# Patient Record
Sex: Female | Born: 1935
Health system: Southern US, Community
[De-identification: ages and names within clinical notes are randomized; demographics above are authoritative.]

## PROBLEM LIST (undated history)

## (undated) DIAGNOSIS — M810 Age-related osteoporosis without current pathological fracture: Secondary | ICD-10-CM

## (undated) DIAGNOSIS — R2689 Other abnormalities of gait and mobility: Secondary | ICD-10-CM

## (undated) DIAGNOSIS — H9190 Unspecified hearing loss, unspecified ear: Secondary | ICD-10-CM

## (undated) DIAGNOSIS — F419 Anxiety disorder, unspecified: Secondary | ICD-10-CM

## (undated) DIAGNOSIS — J309 Allergic rhinitis, unspecified: Secondary | ICD-10-CM

## (undated) DIAGNOSIS — I10 Essential (primary) hypertension: Secondary | ICD-10-CM

## (undated) DIAGNOSIS — E785 Hyperlipidemia, unspecified: Secondary | ICD-10-CM

## (undated) DIAGNOSIS — R739 Hyperglycemia, unspecified: Secondary | ICD-10-CM

## (undated) DIAGNOSIS — S42301A Unspecified fracture of shaft of humerus, right arm, initial encounter for closed fracture: Secondary | ICD-10-CM

## (undated) DIAGNOSIS — R5383 Other fatigue: Secondary | ICD-10-CM

## (undated) DIAGNOSIS — R413 Other amnesia: Secondary | ICD-10-CM

## (undated) HISTORY — DX: Unspecified fracture of shaft of humerus, right arm, initial encounter for closed fracture: S42.301A

## (undated) HISTORY — DX: Age-related osteoporosis without current pathological fracture: M81.0

## (undated) HISTORY — PX: ABDOMINAL HYSTERECTOMY: SHX81

## (undated) HISTORY — DX: Other abnormalities of gait and mobility: R26.89

## (undated) HISTORY — DX: Anxiety disorder, unspecified: F41.9

## (undated) HISTORY — DX: Hyperglycemia, unspecified: R73.9

## (undated) HISTORY — DX: Allergic rhinitis, unspecified: J30.9

## (undated) HISTORY — DX: Hyperlipidemia, unspecified: E78.5

## (undated) HISTORY — DX: Other fatigue: R53.83

## (undated) HISTORY — DX: Essential (primary) hypertension: I10

## (undated) HISTORY — DX: Unspecified hearing loss, unspecified ear: H91.90

## (undated) HISTORY — DX: Other amnesia: R41.3

---
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
## 1986-12-13 HISTORY — PX: ELBOW SURGERY: SHX618

## 2008-12-13 HISTORY — PX: CATARACT EXTRACTION: SUR2

## 2013-12-13 HISTORY — PX: HUMERUS FRACTURE SURGERY: SHX670

## 2014-06-12 HISTORY — PX: FEMUR FRACTURE SURGERY: SHX633

## 2014-12-27 DIAGNOSIS — L84 Corns and callosities: Secondary | ICD-10-CM | POA: Diagnosis not present

## 2014-12-27 DIAGNOSIS — M79672 Pain in left foot: Secondary | ICD-10-CM | POA: Diagnosis not present

## 2014-12-27 DIAGNOSIS — M79671 Pain in right foot: Secondary | ICD-10-CM | POA: Diagnosis not present

## 2014-12-27 DIAGNOSIS — L602 Onychogryphosis: Secondary | ICD-10-CM | POA: Diagnosis not present

## 2014-12-30 ENCOUNTER — Other Ambulatory Visit: Payer: Self-pay

## 2014-12-30 MED ORDER — METOPROLOL SUCCINATE ER 100 MG PO TB24: ORAL_TABLET | ORAL | Status: DC

## 2014-12-30 NOTE — Telephone Encounter (Signed)
Spoke with husband, she is out of the Metoprolol (here from [place]) made appt with Shanda BumpsJessica for 01/06/15, will ask Dr. Renato Gailseed if we can call in just enough until her appointment. Dr. Renato Gailseed ok #[phone]. Called patient back at 5:00 no answer. Faxed Rx to [facility].

## 2015-01-06 ENCOUNTER — Encounter: Payer: Self-pay | Admitting: Nurse Practitioner

## 2015-01-06 ENCOUNTER — Ambulatory Visit (INDEPENDENT_AMBULATORY_CARE_PROVIDER_SITE_OTHER): Payer: Medicare Other | Admitting: Nurse Practitioner

## 2015-01-06 VITALS — BP 120/78 | HR 72 | Temp 98.9°F | Ht 62.5 in | Wt 124.0 lb

## 2015-01-06 DIAGNOSIS — E785 Hyperlipidemia, unspecified: Secondary | ICD-10-CM | POA: Diagnosis not present

## 2015-01-06 DIAGNOSIS — J309 Allergic rhinitis, unspecified: Secondary | ICD-10-CM | POA: Insufficient documentation

## 2015-01-06 DIAGNOSIS — F419 Anxiety disorder, unspecified: Secondary | ICD-10-CM | POA: Diagnosis not present

## 2015-01-06 DIAGNOSIS — I10 Essential (primary) hypertension: Secondary | ICD-10-CM | POA: Diagnosis not present

## 2015-01-06 DIAGNOSIS — T7840XA Allergy, unspecified, initial encounter: Secondary | ICD-10-CM | POA: Insufficient documentation

## 2015-01-06 DIAGNOSIS — H9193 Unspecified hearing loss, bilateral: Secondary | ICD-10-CM

## 2015-01-06 DIAGNOSIS — R739 Hyperglycemia, unspecified: Secondary | ICD-10-CM

## 2015-01-06 DIAGNOSIS — F418 Other specified anxiety disorders: Secondary | ICD-10-CM | POA: Insufficient documentation

## 2015-01-06 DIAGNOSIS — H6121 Impacted cerumen, right ear: Secondary | ICD-10-CM

## 2015-01-06 DIAGNOSIS — M810 Age-related osteoporosis without current pathological fracture: Secondary | ICD-10-CM

## 2015-01-06 NOTE — Progress Notes (Signed)
Patient ID: Ana [name], female   DOB: 1926-07-07, 81 y.o.   MRN: [id number]    PCP: Sharon Seller, NP  Allergies  Allergen Reactions  . Ace Inhibitors     Chief Complaint  Patient presents with  . Medical Management of Chronic Issues    New Patient, moved to [facility] 11/15/14, from [place], [place]. Needs medications refilled     HPI: Patient is a 81 y.o. female seen in the office today to establish care. [facility] and already has appt set up with Dr Chilton Si. Needs medication refills. New to the area, family lives here. From [place].  Pt with multiple falls and fx. humerus fracture, right, conservation treatment, splint only  Another fall in July and fell and fx femur s/p ORIF Increase urination, twice a night for 4-5 months, feeling that she has to go. No incontinence.   Was told she was pre-diabetic and to watch diet at last visit when she was in [place].  Review of Systems:  Review of Systems  Constitutional: Negative for activity change, appetite change, fatigue and unexpected weight change.  HENT: Positive for hearing loss. Negative for congestion.   Eyes: Negative.   Respiratory: Negative for cough and shortness of breath.   Cardiovascular: Negative for chest pain, palpitations and leg swelling.  Gastrointestinal: Negative for abdominal pain, diarrhea and constipation.  Genitourinary: Positive for frequency. Negative for dysuria and difficulty urinating.  Musculoskeletal: Negative for myalgias and arthralgias.  Skin: Negative for color change and wound.       Dry skin   Neurological: Positive for dizziness. Negative for weakness, light-headedness and headaches. Facial asymmetry: occasionally if she gets up too fast.  Hematological: Bruises/bleeds easily.  Psychiatric/Behavioral: Positive for confusion. Negative for agitation. The patient is nervous/anxious.        Anxiety and depression but this is currently uncontrolled.     Past Medical History  Diagnosis Date  .  Hypertension   . Hyperlipidemia   . Anxiety    Past Surgical History  Procedure Laterality Date  . Tonsillectomy  1935  . Abdominal hysterectomy    . Cataract extraction  2010  . Humerus fracture surgery Right 2015    [place], [place] Dr. Eber Hong  . Femur fracture surgery Right 06/2014    [place], [place] Dr. Dione Housekeeper  . Elbow surgery Right 1988   Social History:   reports that she has never smoked. She has never used smokeless tobacco. She reports that she drinks about 0.6 - 1.2 oz of alcohol per week. She reports that she does not use illicit drugs.  Family History  Problem Relation Age of Onset  . Heart disease Mother   . Diabetes Daughter     Medications: Patient's Medications  New Prescriptions   No medications on file  Previous Medications   ALENDRONATE (FOSAMAX) 35 MG TABLET    Take 35 mg by mouth. Take with a full glass of water on an empty stomach once a week .   ASPIRIN 325 MG TABLET    Take 325 mg by mouth. Take one as needed   B COMPLEX-C-FOLIC ACID PO    Take by mouth. Take one tablet daily   CALCIUM-MAGNESIUM-ZINC 333-133-5 MG TABS    Take by mouth. Take one tablet daily   CHOLECALCIFEROL (VITAMIN D3) 1000 UNITS CAPS    Take by mouth. Take one tablet daily   DIPHENHYDRAMINE (BENADRYL) 25 MG TABLET    Take 25 mg by mouth. Take one as  needed   EPINEPHRINE 0.3 MG/0.3 ML IJ SOAJ INJECTION    Inject into the muscle once. Inject as directed as needed   FERROUS SULFATE 325 (65 FE) MG TABLET    Take 325 mg by mouth daily with breakfast.   FLUOXETINE (PROZAC) 40 MG CAPSULE    Take 40 mg by mouth. Take one tablet daily for nerves   FLUTICASONE (FLONASE) 50 MCG/ACT NASAL SPRAY    Place into both nostrils. As needed   METOPROLOL SUCCINATE (TOPROL-XL) 100 MG 24 HR TABLET    Take with or immediately following a meal once a day for blood pressure   PRAVASTATIN (PRAVACHOL) 80 MG TABLET    Take 80 mg by mouth. Take one tablet daily in evening for cholesterol  Modified  Medications   No medications on file  Discontinued Medications   GABAPENTIN (NEURONTIN) 300 MG CAPSULE    Take 300 mg by mouth. Take one capsule daily at bedtime     Physical Exam:  Filed Vitals:   01/06/15 1350  BP: 120/78  Pulse: 72  Temp: 98.9 F (37.2 C)  TempSrc: Oral  Height: 5' 2.5" (1.588 m)  Weight: 124 lb (56.246 kg)  SpO2: 94%    Physical Exam  Constitutional: She is oriented to person, place, and time. She appears well-developed and well-nourished. No distress.  HENT:  Head: Normocephalic and atraumatic.  Mouth/Throat: Oropharynx is clear and moist. No oropharyngeal exudate.  Eyes: Conjunctivae are normal. Pupils are equal, round, and reactive to light.  Neck: Normal range of motion. Neck supple.  Cardiovascular: Normal rate, regular rhythm and normal heart sounds.   Pulmonary/Chest: Effort normal and breath sounds normal.  Abdominal: Soft. Bowel sounds are normal.  Musculoskeletal: She exhibits no edema or tenderness.  Neurological: She is alert and oriented to person, place, and time.  Skin: Skin is warm and dry. She is not diaphoretic.  Psychiatric: She has a normal mood and affect.    Labs reviewed: Basic Metabolic Panel: No results for input(s): NA, K, CL, CO2, GLUCOSE, BUN, CREATININE, CALCIUM, MG, PHOS, TSH in the last 8760 hours. Liver Function Tests: No results for input(s): AST, ALT, ALKPHOS, BILITOT, PROT, ALBUMIN in the last 8760 hours. No results for input(s): LIPASE, AMYLASE in the last 8760 hours. No results for input(s): AMMONIA in the last 8760 hours. CBC: No results for input(s): WBC, NEUTROABS, HGB, HCT, MCV, PLT in the last 8760 hours. Lipid Panel: No results for input(s): CHOL, HDL, LDLCALC, TRIG, CHOLHDL, LDLDIRECT in the last 8760 hours. TSH: No results for input(s): TSH in the last 8760 hours. A1C: No results found for: HGBA1C   Assessment/Plan  1. Allergic rhinitis, unspecified allergic rhinitis type -conts on flonase    2. Osteoporosis conts on fosamax   3. Anxiety Does not feel like this is properly controlled but there has been no major changes from baseline, will cont prozac at this time  4. Hyperlipidemia conts on Pravachol Fasting Lipids and CMP  ordered to be done at [facility]   5. Essential hypertension Blood pressure stable at this time. conts on metoprolol succinate and ASA -will also follow up CBC at [facility]  6. Hyperglycemia -hx of prediabetes, will follow up A1c   7. Hearing loss, bilateral - Ambulatory referral to Audiology  8. Cerumen impaction, right To use debrox into right ear 5-10 drops twice daily for 3 days then to see clinic nurse to flush ear to remove wax  To follow up with Dr Chilton Si for EV in  2 months

## 2015-01-06 NOTE — Patient Instructions (Signed)
[facility]  Keep follow up with Dr Chilton SiGreen.

## 2015-01-09 DIAGNOSIS — R739 Hyperglycemia, unspecified: Secondary | ICD-10-CM | POA: Diagnosis not present

## 2015-01-09 DIAGNOSIS — E785 Hyperlipidemia, unspecified: Secondary | ICD-10-CM | POA: Diagnosis not present

## 2015-01-09 DIAGNOSIS — I10 Essential (primary) hypertension: Secondary | ICD-10-CM | POA: Diagnosis not present

## 2015-01-09 LAB — BASIC METABOLIC PANEL
BUN: 13 mg/dL (ref 4–21)
Creatinine: 0.7 mg/dL (ref 0.5–1.1)
GLUCOSE: 101 mg/dL
Potassium: 4.3 mmol/L (ref 3.4–5.3)
Sodium: 143 mmol/L (ref 137–147)

## 2015-01-09 LAB — HEPATIC FUNCTION PANEL
ALT: 15 U/L (ref 7–35)
AST: 17 U/L (ref 13–35)
Alkaline Phosphatase: 66 U/L (ref 25–125)
Bilirubin, Total: 0.6 mg/dL

## 2015-01-09 LAB — LIPID PANEL
CHOLESTEROL: 178 mg/dL (ref 0–200)
HDL: 54 mg/dL (ref 35–70)
LDL CALC: 105 mg/dL
Triglycerides: 96 mg/dL (ref 40–160)

## 2015-01-09 LAB — CBC AND DIFFERENTIAL
HCT: 40 % (ref 36–46)
Hemoglobin: 13.4 g/dL (ref 12.0–16.0)
Platelets: 246 10*3/uL (ref 150–399)
WBC: 7.3 10*3/mL

## 2015-01-09 LAB — HEMOGLOBIN A1C: HEMOGLOBIN A1C: 5.9 % (ref 4.0–6.0)

## 2015-01-10 ENCOUNTER — Other Ambulatory Visit: Payer: Self-pay | Admitting: Internal Medicine

## 2015-01-13 ENCOUNTER — Encounter: Payer: Self-pay | Admitting: *Deleted

## 2015-01-15 DIAGNOSIS — H903 Sensorineural hearing loss, bilateral: Secondary | ICD-10-CM | POA: Diagnosis not present

## 2015-01-15 DIAGNOSIS — H9311 Tinnitus, right ear: Secondary | ICD-10-CM | POA: Diagnosis not present

## 2015-01-21 ENCOUNTER — Encounter: Payer: Self-pay | Admitting: Internal Medicine

## 2015-03-17 ENCOUNTER — Other Ambulatory Visit: Payer: Self-pay | Admitting: *Deleted

## 2015-03-17 MED ORDER — FLUOXETINE HCL 40 MG PO CAPS
ORAL_CAPSULE | ORAL | Status: DC
Start: 2015-03-17 — End: 2015-08-19

## 2015-03-17 NOTE — Telephone Encounter (Signed)
Ana [name] 

## 2015-04-11 DIAGNOSIS — L84 Corns and callosities: Secondary | ICD-10-CM | POA: Diagnosis not present

## 2015-04-11 DIAGNOSIS — M79672 Pain in left foot: Secondary | ICD-10-CM | POA: Diagnosis not present

## 2015-04-11 DIAGNOSIS — M79671 Pain in right foot: Secondary | ICD-10-CM | POA: Diagnosis not present

## 2015-04-11 DIAGNOSIS — L602 Onychogryphosis: Secondary | ICD-10-CM | POA: Diagnosis not present

## 2015-04-17 ENCOUNTER — Encounter: Payer: Self-pay | Admitting: *Deleted

## 2015-04-17 DIAGNOSIS — I10 Essential (primary) hypertension: Secondary | ICD-10-CM | POA: Diagnosis not present

## 2015-04-17 DIAGNOSIS — R739 Hyperglycemia, unspecified: Secondary | ICD-10-CM | POA: Diagnosis not present

## 2015-04-17 DIAGNOSIS — E785 Hyperlipidemia, unspecified: Secondary | ICD-10-CM | POA: Diagnosis not present

## 2015-04-17 LAB — LIPID PANEL
Cholesterol: 199 mg/dL (ref 0–200)
HDL: 66 mg/dL (ref 35–70)
LDL Cholesterol: 113 mg/dL
TRIGLYCERIDES: 100 mg/dL (ref 40–160)

## 2015-04-17 LAB — HEPATIC FUNCTION PANEL
ALT: 14 U/L (ref 7–35)
AST: 16 U/L (ref 13–35)
Alkaline Phosphatase: 77 U/L (ref 25–125)
BILIRUBIN, TOTAL: 0.6 mg/dL

## 2015-04-17 LAB — BASIC METABOLIC PANEL
BUN: 15 mg/dL (ref 4–21)
Creatinine: 0.7 mg/dL (ref 0.5–1.1)
Glucose: 103 mg/dL
POTASSIUM: 4.4 mmol/L (ref 3.4–5.3)
Sodium: 143 mmol/L (ref 137–147)

## 2015-04-17 LAB — CBC AND DIFFERENTIAL
HEMATOCRIT: 40 % (ref 36–46)
HEMOGLOBIN: 13.7 g/dL (ref 12.0–16.0)
Platelets: 229 10*3/uL (ref 150–399)
WBC: 7.9 10*3/mL

## 2015-04-17 LAB — HEMOGLOBIN A1C: Hgb A1c MFr Bld: 5.8 % (ref 4.0–6.0)

## 2015-04-22 ENCOUNTER — Encounter: Payer: Self-pay | Admitting: Internal Medicine

## 2015-04-22 ENCOUNTER — Non-Acute Institutional Stay: Payer: Medicare Other | Admitting: Internal Medicine

## 2015-04-22 VITALS — BP 124/80 | HR 60 | Temp 97.8°F | Ht 62.5 in | Wt 132.0 lb

## 2015-04-22 DIAGNOSIS — R413 Other amnesia: Secondary | ICD-10-CM | POA: Diagnosis not present

## 2015-04-22 DIAGNOSIS — I10 Essential (primary) hypertension: Secondary | ICD-10-CM | POA: Diagnosis not present

## 2015-04-22 DIAGNOSIS — F419 Anxiety disorder, unspecified: Secondary | ICD-10-CM

## 2015-04-22 DIAGNOSIS — R739 Hyperglycemia, unspecified: Secondary | ICD-10-CM | POA: Diagnosis not present

## 2015-04-22 DIAGNOSIS — E785 Hyperlipidemia, unspecified: Secondary | ICD-10-CM

## 2015-04-22 DIAGNOSIS — R5382 Chronic fatigue, unspecified: Secondary | ICD-10-CM | POA: Diagnosis not present

## 2015-04-22 NOTE — Progress Notes (Signed)
Failed clock drawing  

## 2015-04-23 DIAGNOSIS — M6281 Muscle weakness (generalized): Secondary | ICD-10-CM | POA: Diagnosis not present

## 2015-04-23 DIAGNOSIS — R2681 Unsteadiness on feet: Secondary | ICD-10-CM | POA: Diagnosis not present

## 2015-04-25 DIAGNOSIS — M6281 Muscle weakness (generalized): Secondary | ICD-10-CM | POA: Diagnosis not present

## 2015-04-25 DIAGNOSIS — R2681 Unsteadiness on feet: Secondary | ICD-10-CM | POA: Diagnosis not present

## 2015-04-28 ENCOUNTER — Encounter: Payer: Self-pay | Admitting: Internal Medicine

## 2015-04-28 DIAGNOSIS — H9190 Unspecified hearing loss, unspecified ear: Secondary | ICD-10-CM

## 2015-04-28 DIAGNOSIS — R5383 Other fatigue: Secondary | ICD-10-CM | POA: Insufficient documentation

## 2015-04-28 DIAGNOSIS — R413 Other amnesia: Secondary | ICD-10-CM

## 2015-04-28 DIAGNOSIS — R2689 Other abnormalities of gait and mobility: Secondary | ICD-10-CM | POA: Insufficient documentation

## 2015-04-28 DIAGNOSIS — R739 Hyperglycemia, unspecified: Secondary | ICD-10-CM | POA: Insufficient documentation

## 2015-04-28 DIAGNOSIS — S8291XA Unspecified fracture of right lower leg, initial encounter for closed fracture: Secondary | ICD-10-CM | POA: Insufficient documentation

## 2015-04-28 DIAGNOSIS — I8390 Asymptomatic varicose veins of unspecified lower extremity: Secondary | ICD-10-CM | POA: Insufficient documentation

## 2015-04-28 DIAGNOSIS — S42301A Unspecified fracture of shaft of humerus, right arm, initial encounter for closed fracture: Secondary | ICD-10-CM

## 2015-04-28 HISTORY — DX: Unspecified hearing loss, unspecified ear: H91.90

## 2015-04-28 HISTORY — DX: Other fatigue: R53.83

## 2015-04-28 HISTORY — DX: Other abnormalities of gait and mobility: R26.89

## 2015-04-28 HISTORY — DX: Other amnesia: R41.3

## 2015-04-28 HISTORY — DX: Unspecified fracture of shaft of humerus, right arm, initial encounter for closed fracture: S42.301A

## 2015-04-28 MED ORDER — DONEPEZIL HCL 5 MG PO TABS: ORAL_TABLET | ORAL | Status: DC

## 2015-04-28 NOTE — Progress Notes (Signed)
Patient ID: Ana [name], female   DOB: 03/12/1926, 81 y.o.   MRN: [id number]    HISTORY AND PHYSICAL  [facility] [address]    Place of Service: Clinic (12)   Extended Emergency Contact Information Primary Emergency Contact: Gordan,[place] Address: [address]          [place], [place] [address] [place] Home Phone: [phone] Relation: Daughter Secondary Emergency Contact: Knabe,Donald Address: [address]          [place], [place] [address] [place] Relation: Spouse  Advanced Directive information Does patient have an advance directive?: Yes, Type of Advance Directive: MidwifeHealthcare Power of Attorney;Living will, Does patient want to make changes to advanced directive?: Yes - information given (having them up dated to [employer] [place] laws)  Chief Complaint  Patient presents with  . Annual Exam    Comprehensive exam: blood pressure, cholesterol, anxiety, hyperglycemia. Here with husband Roe CoombsDon    HPI:  Essential hypertension: Controlled  Hyperlipidemia: Controlled. Total cholesterol 199. HDL 66. LDL 113.  Anxiety: Improved  Hyperglycemia: Mild elevation of blood sugar on last lab to 103 mg percent. Not medicated.    Past Medical History  Diagnosis Date  . Hypertension   . Hyperlipidemia   . Anxiety   . Osteoporosis   . Allergic rhinitis   . Hyperglycemia     Past Surgical History  Procedure Laterality Date  . Tonsillectomy  1935  . Abdominal hysterectomy    . Cataract extraction  2010  . Humerus fracture surgery Right 2015    [place], [place] Dr. Eber HongBrian Miller  . Femur fracture surgery Right 06/2014    [place], [place] Dr. Dione HousekeeperJohn Vanderhoof  . Elbow surgery Right 1988    Patient Care Team: Sharon SellerJessica K Eubanks, NP as PCP - General (Nurse Practitioner)  History   Social History  . Marital Status: Unknown    Spouse Name: N/A  . Number of Children: N/A  . Years of Education: N/A   Occupational History  . Housewife     Social History Main Topics  . Smoking status: Never Smoker   . Smokeless tobacco: Never Used  . Alcohol Use: 0.6 - 1.2 oz/week    1-2 Glasses of wine per week     Comment: 1-2 glasses nightly   . Drug Use: No  . Sexual Activity: Not on file   Other Topics Concern  . Not on file   Social History Narrative   [facility]   Married Dorinda HillDonald   Never smoked   Alcohol -wine 1-2 daily   Caffeine coffee 2 daily   Exercise walking   Walks with walker   Living Will, POA, DNR     reports that she has never smoked. She has never used smokeless tobacco. She reports that she drinks about 0.6 - 1.2 oz of alcohol per week. She reports that she does not use illicit drugs.  Family History  Problem Relation Age of Onset  . Heart disease Mother   . Diabetes Daughter    Family Status  Relation Status Death Age  . Mother Deceased 8188    cardiac arrest  . Daughter Alive   . Father Deceased 6045    accident  . Daughter Alive   . Son Alive   . Son Alive     Immunization History  Administered Date(s) Administered  . DTaP 05/12/2014  . Influenza-Unspecified 10/07/2014  . PPD Test 10/16/2014  . Pneumococcal Polysaccharide-23  08/20/2011    Allergies  Allergen Reactions  . Ace Inhibitors   . Almond Oil   . Plum Pulp     Medications: Patient's Medications  New Prescriptions   No medications on file  Previous Medications   ALENDRONATE (FOSAMAX) 35 MG TABLET    Take 35 mg by mouth. Take with a full glass of water on an empty stomach once a week .   ASPIRIN 325 MG TABLET    Take 325 mg by mouth. Take one as needed   B COMPLEX-C-FOLIC ACID PO    Take by mouth. Take one tablet daily   CALCIUM-MAGNESIUM-ZINC 333-133-5 MG TABS    Take by mouth. Take one tablet daily   CHOLECALCIFEROL (VITAMIN D3) 1000 UNITS CAPS    Take by mouth. Take one tablet daily   DIPHENHYDRAMINE (BENADRYL) 25 MG TABLET    Take 25 mg by mouth. Take one as needed   EPINEPHRINE 0.3 MG/0.3  ML IJ SOAJ INJECTION    Inject into the muscle once. Inject as directed as needed   FERROUS SULFATE 325 (65 FE) MG TABLET    Take 325 mg by mouth daily with breakfast.   FLUOXETINE (PROZAC) 40 MG CAPSULE    Take one tablet by mouth once daily for nerves   FLUTICASONE (FLONASE) 50 MCG/ACT NASAL SPRAY    Place into both nostrils. As needed   METOPROLOL SUCCINATE (TOPROL-XL) 100 MG 24 HR TABLET    Take 1 tablet (100 mg total) by mouth daily.   PRAVASTATIN (PRAVACHOL) 80 MG TABLET    Take 80 mg by mouth. Take one tablet daily in evening for cholesterol  Modified Medications   No medications on file  Discontinued Medications   No medications on file    Review of Systems  Constitutional: Negative for activity change, appetite change, fatigue and unexpected weight change.  HENT: Positive for hearing loss. Negative for congestion.   Eyes: Negative.   Respiratory: Negative for cough and shortness of breath.   Cardiovascular: Negative for chest pain, palpitations and leg swelling.  Gastrointestinal: Negative for abdominal pain, diarrhea and constipation.  Genitourinary: Positive for frequency. Negative for dysuria and difficulty urinating.  Musculoskeletal: Negative for myalgias and arthralgias.  Skin: Negative for color change and wound.       Dry skin   Neurological: Positive for dizziness. Negative for weakness, light-headedness and headaches. Facial asymmetry: occasionally if she gets up too fast.  Hematological: Bruises/bleeds easily.  Psychiatric/Behavioral: Positive for confusion. Negative for agitation. The patient is nervous/anxious.        Anxiety and depression but this is currently controlled.     Filed Vitals:   04/22/15 1046  BP: 124/80  Pulse: 60  Temp: 97.8 F (36.6 C)  TempSrc: Oral  Height: 5' 2.5" (1.588 m)  Weight: 132 lb (59.875 kg)  SpO2: 96%   Body mass index is 23.74 kg/(m^2).  Physical Exam  Constitutional: She is oriented to person, place, and time. She  appears well-developed and well-nourished. No distress.  HENT:  Head: Normocephalic and atraumatic.  Mouth/Throat: Oropharynx is clear and moist. No oropharyngeal exudate.  Eyes: Conjunctivae are normal. Pupils are equal, round, and reactive to light.  Neck: Normal range of motion. Neck supple.  Cardiovascular: Normal rate, regular rhythm and normal heart sounds.   Pulmonary/Chest: Effort normal and breath sounds normal.  Abdominal: Soft. Bowel sounds are normal.  Musculoskeletal: She exhibits no edema or tenderness.  Neurological: She is alert and oriented to person, place, and  time.  04/22/2015 MMSE 21/30. Failed clock drawing  Skin: Skin is warm and dry. She is not diaphoretic.  Psychiatric: She has a normal mood and affect.     Labs reviewed: Abstract on 04/17/2015  Component Date Value Ref Range Status  . Hemoglobin 04/17/2015 13.7  12.0 - 16.0 g/dL Final  . HCT 04/54/0981 40  36 - 46 % Final  . Platelets 04/17/2015 229  150 - 399 K/L Final  . WBC 04/17/2015 7.9   Final  . Glucose 04/17/2015 103   Final  . BUN 04/17/2015 15  4 - 21 mg/dL Final  . Creatinine 19/14/7829 0.7  0.5 - 1.1 mg/dL Final  . Potassium 56/21/3086 4.4  3.4 - 5.3 mmol/L Final  . Sodium 04/17/2015 143  137 - 147 mmol/L Final  . Triglycerides 04/17/2015 100  40 - 160 mg/dL Final  . Cholesterol 57/84/6962 199  0 - 200 mg/dL Final  . HDL 95/28/4132 66  35 - 70 mg/dL Final  . LDL Cholesterol 04/17/2015 113   Final  . Alkaline Phosphatase 04/17/2015 77  25 - 125 U/L Final  . ALT 04/17/2015 14  7 - 35 U/L Final  . AST 04/17/2015 16  13 - 35 U/L Final  . Bilirubin, Total 04/17/2015 0.6   Final  . Hgb A1c MFr Bld 04/17/2015 5.8  4.0 - 6.0 % Final     Assessment/Plan  1. Essential hypertension Controlled  2. Hyperlipidemia Adequately controlled. Higher HDL offsets slightly high LDL.  3. Anxiety Improved  4. Hyperglycemia Continue to monitor.  5. Memory loss -Start donepezil 5 mg daily

## 2015-04-30 DIAGNOSIS — M6281 Muscle weakness (generalized): Secondary | ICD-10-CM | POA: Diagnosis not present

## 2015-04-30 DIAGNOSIS — R2681 Unsteadiness on feet: Secondary | ICD-10-CM | POA: Diagnosis not present

## 2015-05-02 DIAGNOSIS — M6281 Muscle weakness (generalized): Secondary | ICD-10-CM | POA: Diagnosis not present

## 2015-05-02 DIAGNOSIS — R2681 Unsteadiness on feet: Secondary | ICD-10-CM | POA: Diagnosis not present

## 2015-05-05 DIAGNOSIS — M6281 Muscle weakness (generalized): Secondary | ICD-10-CM | POA: Diagnosis not present

## 2015-05-05 DIAGNOSIS — R2681 Unsteadiness on feet: Secondary | ICD-10-CM | POA: Diagnosis not present

## 2015-05-07 DIAGNOSIS — R2681 Unsteadiness on feet: Secondary | ICD-10-CM | POA: Diagnosis not present

## 2015-05-07 DIAGNOSIS — M6281 Muscle weakness (generalized): Secondary | ICD-10-CM | POA: Diagnosis not present

## 2015-05-09 DIAGNOSIS — R2681 Unsteadiness on feet: Secondary | ICD-10-CM | POA: Diagnosis not present

## 2015-05-09 DIAGNOSIS — M6281 Muscle weakness (generalized): Secondary | ICD-10-CM | POA: Diagnosis not present

## 2015-05-12 DIAGNOSIS — M6281 Muscle weakness (generalized): Secondary | ICD-10-CM | POA: Diagnosis not present

## 2015-05-12 DIAGNOSIS — R2681 Unsteadiness on feet: Secondary | ICD-10-CM | POA: Diagnosis not present

## 2015-05-14 DIAGNOSIS — M6281 Muscle weakness (generalized): Secondary | ICD-10-CM | POA: Diagnosis not present

## 2015-05-14 DIAGNOSIS — R2681 Unsteadiness on feet: Secondary | ICD-10-CM | POA: Diagnosis not present

## 2015-05-15 DIAGNOSIS — M6281 Muscle weakness (generalized): Secondary | ICD-10-CM | POA: Diagnosis not present

## 2015-05-15 DIAGNOSIS — R2681 Unsteadiness on feet: Secondary | ICD-10-CM | POA: Diagnosis not present

## 2015-05-19 DIAGNOSIS — M6281 Muscle weakness (generalized): Secondary | ICD-10-CM | POA: Diagnosis not present

## 2015-05-19 DIAGNOSIS — R2681 Unsteadiness on feet: Secondary | ICD-10-CM | POA: Diagnosis not present

## 2015-05-20 ENCOUNTER — Encounter: Payer: Self-pay | Admitting: Internal Medicine

## 2015-05-20 ENCOUNTER — Non-Acute Institutional Stay: Payer: Medicare Other | Admitting: Internal Medicine

## 2015-05-20 VITALS — BP 130/72 | HR 60 | Temp 97.4°F | Wt 133.0 lb

## 2015-05-20 DIAGNOSIS — I10 Essential (primary) hypertension: Secondary | ICD-10-CM

## 2015-05-20 DIAGNOSIS — R413 Other amnesia: Secondary | ICD-10-CM

## 2015-05-20 MED ORDER — DONEPEZIL HCL 10 MG PO TABS
ORAL_TABLET | ORAL | Status: DC
Start: 2015-05-20 — End: 2016-05-17

## 2015-05-20 NOTE — Progress Notes (Signed)
Patient ID: Ana [name], female   DOB: [birth year], 81 y.o.   MRN: [id number]    [facility]     Place of Service: Clinic (12)     Allergies  Allergen Reactions  . Ace Inhibitors   . Almond Oil   . Plum Pulp     Chief Complaint  Patient presents with  . Medical Management of Chronic Issues    blood pressure, anxiety, memory . Here with husband    HPI:  Memory loss: Started Aricept 5 mg daily one month ago. Has tolerated this well. Denies nausea, diarrhea, palpitations, increase in anxiety, or increased confusion. She is aware of her memory loss. She is accompanied by her husband this visit. He has not noted any physical changes starting Aricept.  Essential hypertension: Controlled    Medications: Patient's Medications  New Prescriptions   No medications on file  Previous Medications   ALENDRONATE (FOSAMAX) 35 MG TABLET    Take 35 mg by mouth. Take with a full glass of water on an empty stomach once a week .   ASPIRIN 325 MG TABLET    Take 325 mg by mouth. Take one as needed   B COMPLEX-C-FOLIC ACID PO    Take by mouth. Take one tablet daily   CALCIUM-MAGNESIUM-ZINC 333-133-5 MG TABS    Take by mouth. Take one tablet daily   CHOLECALCIFEROL (VITAMIN D3) 1000 UNITS CAPS    Take by mouth. Take one tablet daily   DIPHENHYDRAMINE (BENADRYL) 25 MG TABLET    Take 25 mg by mouth. Take one as needed   DONEPEZIL (ARICEPT) 5 MG TABLET    One daily to help preserve memory   FLUOXETINE (PROZAC) 40 MG CAPSULE    Take one tablet by mouth once daily for nerves   FLUTICASONE (FLONASE) 50 MCG/ACT NASAL SPRAY    Place into both nostrils. As needed   METOPROLOL SUCCINATE (TOPROL-XL) 100 MG 24 HR TABLET    Take 1 tablet (100 mg total) by mouth daily.   PRAVASTATIN (PRAVACHOL) 80 MG TABLET    Take 80 mg by mouth. Take one tablet daily in evening for cholesterol  Modified Medications   No medications on file  Discontinued Medications   EPINEPHRINE 0.3 MG/0.3 ML IJ SOAJ  INJECTION    Inject into the muscle once. Inject as directed as needed   FERROUS SULFATE 325 (65 FE) MG TABLET    Take 325 mg by mouth daily with breakfast.     Review of Systems  Constitutional: Negative for activity change, appetite change, fatigue and unexpected weight change.  HENT: Positive for hearing loss. Negative for congestion.   Eyes: Negative.   Respiratory: Negative for cough and shortness of breath.   Cardiovascular: Negative for chest pain, palpitations and leg swelling.  Gastrointestinal: Negative for abdominal pain, diarrhea and constipation.  Genitourinary: Positive for frequency. Negative for dysuria and difficulty urinating.  Musculoskeletal: Negative for myalgias and arthralgias.  Skin: Negative for color change and wound.       Dry skin   Neurological: Positive for dizziness. Negative for weakness, light-headedness and headaches. Facial asymmetry: occasionally if she gets up too fast.  Hematological: Bruises/bleeds easily.  Psychiatric/Behavioral: Positive for confusion. Negative for agitation. The patient is nervous/anxious.        Anxiety and depression but this is currently controlled.     Filed Vitals:   05/20/15 1001  BP: 130/72  Pulse: 60  Temp: 97.4 F (36.3 C)  TempSrc: Oral  Weight: 133 lb (60.328 kg)   Body mass index is 23.92 kg/(m^2).  Physical Exam  Constitutional: She is oriented to person, place, and time. She appears well-developed and well-nourished. No distress.  HENT:  Head: Normocephalic and atraumatic.  Mouth/Throat: Oropharynx is clear and moist. No oropharyngeal exudate.  Eyes: Conjunctivae are normal. Pupils are equal, round, and reactive to light.  Neck: Normal range of motion. Neck supple.  Cardiovascular: Normal rate, regular rhythm and normal heart sounds.   Pulmonary/Chest: Effort normal and breath sounds normal.  Abdominal: Soft. Bowel sounds are normal.  Musculoskeletal: She exhibits no edema or tenderness.    Neurological: She is alert and oriented to person, place, and time.  04/22/2015 MMSE 21/30. Failed clock drawing  Skin: Skin is warm and dry. She is not diaphoretic.  Psychiatric: She has a normal mood and affect.     Labs reviewed: Abstract on 04/17/2015  Component Date Value Ref Range Status  . Hemoglobin 04/17/2015 13.7  12.0 - 16.0 g/dL Final  . HCT 96/04/540905/04/2015 40  36 - 46 % Final  . Platelets 04/17/2015 229  150 - 399 K/L Final  . WBC 04/17/2015 7.9   Final  . Glucose 04/17/2015 103   Final  . BUN 04/17/2015 15  4 - 21 mg/dL Final  . Creatinine 81/19/147805/04/2015 0.7  0.5 - 1.1 mg/dL Final  . Potassium 29/56/213005/04/2015 4.4  3.4 - 5.3 mmol/L Final  . Sodium 04/17/2015 143  137 - 147 mmol/L Final  . Triglycerides 04/17/2015 100  40 - 160 mg/dL Final  . Cholesterol 86/57/846905/04/2015 199  0 - 200 mg/dL Final  . HDL 62/95/284105/04/2015 66  35 - 70 mg/dL Final  . LDL Cholesterol 04/17/2015 113   Final  . Alkaline Phosphatase 04/17/2015 77  25 - 125 U/L Final  . ALT 04/17/2015 14  7 - 35 U/L Final  . AST 04/17/2015 16  13 - 35 U/L Final  . Bilirubin, Total 04/17/2015 0.6   Final  . Hgb A1c MFr Bld 04/17/2015 5.8  4.0 - 6.0 % Final     Assessment/Plan  1. Memory loss -Increase Aricept dose to 10 mg daily  2. Essential hypertension Controlled

## 2015-05-21 DIAGNOSIS — R2681 Unsteadiness on feet: Secondary | ICD-10-CM | POA: Diagnosis not present

## 2015-05-21 DIAGNOSIS — M6281 Muscle weakness (generalized): Secondary | ICD-10-CM | POA: Diagnosis not present

## 2015-05-23 ENCOUNTER — Encounter: Payer: Self-pay | Admitting: Internal Medicine

## 2015-05-23 DIAGNOSIS — M6281 Muscle weakness (generalized): Secondary | ICD-10-CM | POA: Diagnosis not present

## 2015-05-23 DIAGNOSIS — R2681 Unsteadiness on feet: Secondary | ICD-10-CM | POA: Diagnosis not present

## 2015-05-26 DIAGNOSIS — R2681 Unsteadiness on feet: Secondary | ICD-10-CM | POA: Diagnosis not present

## 2015-05-26 DIAGNOSIS — M6281 Muscle weakness (generalized): Secondary | ICD-10-CM | POA: Diagnosis not present

## 2015-05-28 DIAGNOSIS — M6281 Muscle weakness (generalized): Secondary | ICD-10-CM | POA: Diagnosis not present

## 2015-05-28 DIAGNOSIS — R2681 Unsteadiness on feet: Secondary | ICD-10-CM | POA: Diagnosis not present

## 2015-05-30 DIAGNOSIS — M6281 Muscle weakness (generalized): Secondary | ICD-10-CM | POA: Diagnosis not present

## 2015-05-30 DIAGNOSIS — R2681 Unsteadiness on feet: Secondary | ICD-10-CM | POA: Diagnosis not present

## 2015-06-04 ENCOUNTER — Other Ambulatory Visit: Payer: Self-pay | Admitting: *Deleted

## 2015-06-04 DIAGNOSIS — R2681 Unsteadiness on feet: Secondary | ICD-10-CM | POA: Diagnosis not present

## 2015-06-04 DIAGNOSIS — M6281 Muscle weakness (generalized): Secondary | ICD-10-CM | POA: Diagnosis not present

## 2015-06-04 MED ORDER — ALENDRONATE SODIUM 35 MG PO TABS: ORAL_TABLET | ORAL | Status: DC

## 2015-06-04 NOTE — Telephone Encounter (Signed)
[employer] Francis King 

## 2015-06-06 DIAGNOSIS — M6281 Muscle weakness (generalized): Secondary | ICD-10-CM | POA: Diagnosis not present

## 2015-06-06 DIAGNOSIS — R2681 Unsteadiness on feet: Secondary | ICD-10-CM | POA: Diagnosis not present

## 2015-06-10 DIAGNOSIS — M6281 Muscle weakness (generalized): Secondary | ICD-10-CM | POA: Diagnosis not present

## 2015-06-10 DIAGNOSIS — R2681 Unsteadiness on feet: Secondary | ICD-10-CM | POA: Diagnosis not present

## 2015-06-12 DIAGNOSIS — R2681 Unsteadiness on feet: Secondary | ICD-10-CM | POA: Diagnosis not present

## 2015-06-12 DIAGNOSIS — M6281 Muscle weakness (generalized): Secondary | ICD-10-CM | POA: Diagnosis not present

## 2015-06-17 DIAGNOSIS — M6281 Muscle weakness (generalized): Secondary | ICD-10-CM | POA: Diagnosis not present

## 2015-06-17 DIAGNOSIS — R2681 Unsteadiness on feet: Secondary | ICD-10-CM | POA: Diagnosis not present

## 2015-06-19 DIAGNOSIS — R2681 Unsteadiness on feet: Secondary | ICD-10-CM | POA: Diagnosis not present

## 2015-06-19 DIAGNOSIS — M6281 Muscle weakness (generalized): Secondary | ICD-10-CM | POA: Diagnosis not present

## 2015-06-24 DIAGNOSIS — R2681 Unsteadiness on feet: Secondary | ICD-10-CM | POA: Diagnosis not present

## 2015-06-24 DIAGNOSIS — M6281 Muscle weakness (generalized): Secondary | ICD-10-CM | POA: Diagnosis not present

## 2015-06-27 DIAGNOSIS — M6281 Muscle weakness (generalized): Secondary | ICD-10-CM | POA: Diagnosis not present

## 2015-06-27 DIAGNOSIS — R2681 Unsteadiness on feet: Secondary | ICD-10-CM | POA: Diagnosis not present

## 2015-07-08 ENCOUNTER — Telehealth: Payer: Self-pay | Admitting: *Deleted

## 2015-07-08 NOTE — Telephone Encounter (Signed)
Patient husband called and stated that patient needs a letter for long term insurance stating that she is capable of making her own personal/financial decisions.

## 2015-07-09 ENCOUNTER — Encounter: Payer: Self-pay | Admitting: Internal Medicine

## 2015-07-09 NOTE — Telephone Encounter (Signed)
Patient husband notified and letter mailed to him

## 2015-07-09 NOTE — Telephone Encounter (Signed)
Completed 07/09/2015. 

## 2015-08-15 DIAGNOSIS — Z961 Presence of intraocular lens: Secondary | ICD-10-CM | POA: Diagnosis not present

## 2015-08-15 DIAGNOSIS — H52223 Regular astigmatism, bilateral: Secondary | ICD-10-CM | POA: Diagnosis not present

## 2015-08-19 ENCOUNTER — Non-Acute Institutional Stay: Payer: Medicare Other | Admitting: Internal Medicine

## 2015-08-19 ENCOUNTER — Encounter: Payer: Self-pay | Admitting: Internal Medicine

## 2015-08-19 VITALS — BP 128/78 | HR 72 | Temp 97.2°F | Wt 135.0 lb

## 2015-08-19 DIAGNOSIS — F419 Anxiety disorder, unspecified: Secondary | ICD-10-CM | POA: Diagnosis not present

## 2015-08-19 DIAGNOSIS — R29818 Other symptoms and signs involving the nervous system: Secondary | ICD-10-CM

## 2015-08-19 DIAGNOSIS — R739 Hyperglycemia, unspecified: Secondary | ICD-10-CM | POA: Diagnosis not present

## 2015-08-19 DIAGNOSIS — S42301D Unspecified fracture of shaft of humerus, right arm, subsequent encounter for fracture with routine healing: Secondary | ICD-10-CM

## 2015-08-19 DIAGNOSIS — I10 Essential (primary) hypertension: Secondary | ICD-10-CM | POA: Diagnosis not present

## 2015-08-19 DIAGNOSIS — S8291XD Unspecified fracture of right lower leg, subsequent encounter for closed fracture with routine healing: Secondary | ICD-10-CM

## 2015-08-19 DIAGNOSIS — R413 Other amnesia: Secondary | ICD-10-CM | POA: Diagnosis not present

## 2015-08-19 DIAGNOSIS — R5382 Chronic fatigue, unspecified: Secondary | ICD-10-CM | POA: Diagnosis not present

## 2015-08-19 DIAGNOSIS — R2689 Other abnormalities of gait and mobility: Secondary | ICD-10-CM

## 2015-08-19 DIAGNOSIS — E785 Hyperlipidemia, unspecified: Secondary | ICD-10-CM | POA: Diagnosis not present

## 2015-08-19 DIAGNOSIS — M810 Age-related osteoporosis without current pathological fracture: Secondary | ICD-10-CM | POA: Diagnosis not present

## 2015-08-19 NOTE — Progress Notes (Signed)
Failed clock drawing  

## 2015-08-19 NOTE — Progress Notes (Signed)
Patient ID: Ana [name], female   DOB: 07-May-1926, 81 y.o.   MRN: [id number]    [facility]     Place of Service: Clinic (12)     Allergies  Allergen Reactions  . Ace Inhibitors   . Almond Oil   . Plum Pulp     Chief Complaint  Patient presents with  . Medical Management of Chronic Issues    blood pressure, memory . Here with husband  . Medication Management    ran out of Metoprolol 2 weeks ago, "didn't know if she needed it."    HPI:  Essential hypertension - controlled  Memory loss - tolerating donepezil  Osteoporosis - taking Fosamax for over 5 years  Hyperglycemia - follow-up next visit  Hyperlipidemia - follow up next visit  Fracture of right lower leg, closed, with routine healing, subsequent encounter - no residual pain. Fracture was in July 2015.  Fracture of right humerus, with routine healing, subsequent encounter - fractured in May 2015. No residual pain or loss of movement at the shoulder.  Chronic fatigue - resolved  Balance problem - using walker    Medications: Patient's Medications  New Prescriptions   No medications on file  Previous Medications   ALENDRONATE (FOSAMAX) 35 MG TABLET    Take one tablet with a full glass of water on an empty stomach once a week for bones   ASPIRIN 325 MG TABLET    Take 325 mg by mouth. Take one as needed   B COMPLEX-C-FOLIC ACID PO    Take by mouth. Take one tablet daily   CALCIUM-MAGNESIUM-ZINC 333-133-5 MG TABS    Take by mouth. Take one tablet daily   CHOLECALCIFEROL (VITAMIN D3) 1000 UNITS CAPS    Take by mouth. Take one tablet daily   DIPHENHYDRAMINE (BENADRYL) 25 MG TABLET    Take 25 mg by mouth. Take one as needed   DONEPEZIL (ARICEPT) 10 MG TABLET    One daily to help preserve memory   FLUOXETINE (PROZAC) 40 MG CAPSULE    Take one tablet by mouth once daily for nerves   FLUTICASONE (FLONASE) 50 MCG/ACT NASAL SPRAY    Place into both nostrils. As needed   METOPROLOL SUCCINATE  (TOPROL-XL) 100 MG 24 HR TABLET    Take 1 tablet (100 mg total) by mouth daily.   PRAVASTATIN (PRAVACHOL) 80 MG TABLET    Take 80 mg by mouth. Take one tablet daily in evening for cholesterol  Modified Medications   No medications on file  Discontinued Medications   No medications on file     Review of Systems  Constitutional: Negative for activity change, appetite change, fatigue and unexpected weight change.  HENT: Positive for hearing loss. Negative for congestion.   Eyes: Negative.   Respiratory: Negative for cough and shortness of breath.   Cardiovascular: Negative for chest pain, palpitations and leg swelling.  Gastrointestinal: Negative for abdominal pain, diarrhea and constipation.  Genitourinary: Positive for frequency. Negative for dysuria and difficulty urinating.  Musculoskeletal: Negative for myalgias and arthralgias.  Skin: Negative for color change and wound.       Dry skin   Neurological: Positive for dizziness. Negative for weakness, light-headedness and headaches. Facial asymmetry: occasionally if she gets up too fast.  Hematological: Bruises/bleeds easily.  Psychiatric/Behavioral: Positive for confusion. Negative for agitation. The patient is nervous/anxious.        Anxiety and depression but this is currently controlled.     Filed Vitals:  08/19/15 0957  BP: 128/78  Pulse: 72  Temp: 97.2 F (36.2 C)  TempSrc: Oral  Weight: 135 lb (61.236 kg)  SpO2: 94%   Body mass index is 24.28 kg/(m^2).  Physical Exam  Constitutional: She is oriented to person, place, and time. She appears well-developed and well-nourished. No distress.  HENT:  Head: Normocephalic and atraumatic.  Mouth/Throat: Oropharynx is clear and moist. No oropharyngeal exudate.  Eyes: Conjunctivae are normal. Pupils are equal, round, and reactive to light.  Neck: Normal range of motion. Neck supple.  Cardiovascular: Normal rate, regular rhythm and normal heart sounds.   Pulmonary/Chest:  Effort normal and breath sounds normal.  Abdominal: Soft. Bowel sounds are normal.  Musculoskeletal: She exhibits no edema or tenderness.  Neurological: She is alert and oriented to person, place, and time.  04/22/2015 MMSE 21/30. Failed clock drawing  Skin: Skin is warm and dry. She is not diaphoretic.  Psychiatric: She has a normal mood and affect.     Labs reviewed: Lab Summary Latest Ref Rng 04/17/2015 01/09/2015  Hemoglobin 12.0 - 16.0 g/dL 13.7 13.4  Hematocrit 36 - 46 % 40 40  White count - 7.9 7.3  Platelet count 150 - 399 K/L 229 246  Sodium 137 - 147 mmol/L 143 143  Potassium 3.4 - 5.3 mmol/L 4.4 4.3  Calcium - (None) (None)  Phosphorus - (None) (None)  Creatinine 0.5 - 1.1 mg/dL 0.7 0.7  AST 13 - 35 U/L 16 17  Alk Phos 25 - 125 U/L 77 66  Bilirubin - (None) (None)  Glucose - 103 101  Cholesterol 0 - 200 mg/dL 199 178  HDL cholesterol 35 - 70 mg/dL 66 54  Triglycerides 40 - 160 mg/dL 100 96  LDL Direct - (None) (None)  LDL Calc - 113 105  Total protein - (None) (None)  Albumin - (None) (None)   No results found for: TSH, T3TOTAL, T4TOTAL, THYROIDAB Lab Results  Component Value Date   BUN 15 04/17/2015   Lab Results  Component Value Date   HGBA1C 5.8 04/17/2015       Assessment/Plan 1. Essential hypertension Controlled off METOPROLOL. Discontinue metoprolol.  2. Memory loss Intended donepezil  3. Osteoporosis Discontinue Fosamax. Continue calcium supplement  4. Hyperglycemia -CMP, future  5. Hyperlipidemia -Lipid panel, future  6. Fracture of right lower leg, closed, with routine healing, subsequent encounter No residual deformity or pain  7. Fracture of right humerus, with routine healing, subsequent encounter Residual deformity or pain  8. Chronic fatigue Improved  9. Balance problem Unchanged. Continue use of walker  10. Anxiety Doing better. Taper off Prozac.

## 2015-12-29 ENCOUNTER — Other Ambulatory Visit: Payer: Self-pay | Admitting: *Deleted

## 2015-12-29 ENCOUNTER — Other Ambulatory Visit: Payer: Self-pay | Admitting: Internal Medicine

## 2015-12-29 MED ORDER — ALENDRONATE SODIUM 35 MG PO TABS: ORAL_TABLET | ORAL | Status: DC

## 2015-12-29 NOTE — Telephone Encounter (Signed)
[facility]

## 2016-02-09 DIAGNOSIS — I10 Essential (primary) hypertension: Secondary | ICD-10-CM | POA: Diagnosis not present

## 2016-02-09 DIAGNOSIS — E785 Hyperlipidemia, unspecified: Secondary | ICD-10-CM | POA: Diagnosis not present

## 2016-02-09 LAB — BASIC METABOLIC PANEL
BUN: 13 mg/dL (ref 4–21)
CREATININE: 0.8 mg/dL (ref 0.5–1.1)
Glucose: 102 mg/dL
POTASSIUM: 4.2 mmol/L (ref 3.4–5.3)
SODIUM: 143 mmol/L (ref 137–147)

## 2016-02-09 LAB — LIPID PANEL
Cholesterol: 253 mg/dL — AB (ref 0–200)
HDL: 61 mg/dL (ref 35–70)
LDL Cholesterol: 169 mg/dL
TRIGLYCERIDES: 117 mg/dL (ref 40–160)

## 2016-02-09 LAB — HEPATIC FUNCTION PANEL
ALK PHOS: 59 U/L (ref 25–125)
ALT: 15 U/L (ref 7–35)
AST: 19 U/L (ref 13–35)
BILIRUBIN, TOTAL: 6.4 mg/dL

## 2016-02-17 ENCOUNTER — Non-Acute Institutional Stay: Payer: Medicare Other | Admitting: Internal Medicine

## 2016-02-17 ENCOUNTER — Encounter: Payer: Self-pay | Admitting: Internal Medicine

## 2016-02-17 VITALS — BP 120/70 | HR 91 | Temp 97.4°F | Resp 20 | Ht 63.0 in | Wt 139.0 lb

## 2016-02-17 DIAGNOSIS — R413 Other amnesia: Secondary | ICD-10-CM

## 2016-02-17 DIAGNOSIS — R2689 Other abnormalities of gait and mobility: Secondary | ICD-10-CM

## 2016-02-17 DIAGNOSIS — E785 Hyperlipidemia, unspecified: Secondary | ICD-10-CM

## 2016-02-17 DIAGNOSIS — I10 Essential (primary) hypertension: Secondary | ICD-10-CM

## 2016-02-17 DIAGNOSIS — M6740 Ganglion, unspecified site: Secondary | ICD-10-CM

## 2016-02-17 DIAGNOSIS — R29818 Other symptoms and signs involving the nervous system: Secondary | ICD-10-CM | POA: Diagnosis not present

## 2016-02-17 DIAGNOSIS — R739 Hyperglycemia, unspecified: Secondary | ICD-10-CM

## 2016-02-17 DIAGNOSIS — J309 Allergic rhinitis, unspecified: Secondary | ICD-10-CM | POA: Diagnosis not present

## 2016-02-17 DIAGNOSIS — L309 Dermatitis, unspecified: Secondary | ICD-10-CM

## 2016-02-17 MED ORDER — TRIAMCINOLONE ACETONIDE 0.1 % EX CREA: TOPICAL_CREAM | CUTANEOUS | Status: DC

## 2016-02-17 NOTE — Progress Notes (Signed)
Patient ID: Ana [name], female   DOB: [birth year], 82 y.o.   MRN: [id number]    [facility]     Place of Service: Clinic (12)     Allergies  Allergen Reactions  . Ace Inhibitors   . Almond Oil   . Plum Pulp     Chief Complaint  Patient presents with  . Medical Management of Chronic Issues    6 mo f/u    HPI:  Eczema - new problem. Onset about one month ago. Has been scratching left leg, left arm, and right leg to a lesser degree. Areas start to itch and she can't stop scratching. There are several lesions scattered over the territory noted above with extreme erythema and some to the point of drawing blood. No signs of infection.  Gained 15 # in the last year. - Steadily gaining weight. Appetite is good. Her husband controls her diet. She says that shefinishes one meal before he is asking her what she wants for the next meal.  Ganglion of the right wrist. - Initially occurred about 30 years ago and then went away. Has reoccurred over the last several months painless. Does not affect function of the right hand.  Memory loss - she is aware of increasing problems with short-term memory.  Nose is constantly dripping. She wants a "new nose". History of allergic rhinitis.  Medications: Patient's Medications  New Prescriptions   No medications on file  Previous Medications   ALENDRONATE (FOSAMAX) 35 MG TABLET    Take one tablet by mouth with a full glass of water on an empty stomach once weekly for bones   ASPIRIN 325 MG TABLET    Take 325 mg by mouth. Reported on 02/17/2016   B COMPLEX-C-FOLIC ACID PO    Take by mouth. Take one tablet daily   CALCIUM-MAGNESIUM-ZINC 333-133-5 MG TABS    Take by mouth. Take one tablet daily   CHOLECALCIFEROL (VITAMIN D3) 1000 UNITS CAPS    Take by mouth. Take one tablet daily   DIPHENHYDRAMINE (BENADRYL) 25 MG TABLET    Take 25 mg by mouth. Reported on 02/17/2016   DONEPEZIL (ARICEPT) 10 MG TABLET    One daily to help preserve  memory   FLUTICASONE (FLONASE) 50 MCG/ACT NASAL SPRAY    Place into both nostrils. Reported on 02/17/2016  Modified Medications   No medications on file  Discontinued Medications   PRAVASTATIN (PRAVACHOL) 80 MG TABLET    Take 80 mg by mouth. Take one tablet daily in evening for cholesterol     Review of Systems  Constitutional: Negative for activity change, appetite change, fatigue and unexpected weight change.  HENT: Positive for hearing loss and rhinorrhea (allergic rhinitis). Negative for congestion.   Eyes: Negative.   Respiratory: Negative for cough and shortness of breath.   Cardiovascular: Negative for chest pain, palpitations and leg swelling.  Gastrointestinal: Negative for abdominal pain, diarrhea and constipation.  Genitourinary: Positive for frequency. Negative for dysuria and difficulty urinating.  Musculoskeletal: Negative for myalgias and arthralgias.  Skin: Positive for rash. Negative for color change and wound.       Dry skin . Eczematous changes in the left arm and left leg and to a lesser degree the right thigh is involved.  Neurological: Positive for dizziness. Negative for weakness, light-headedness and headaches. Facial asymmetry: occasionally if she gets up too fast.       Memory loss  Hematological: Bruises/bleeds easily.  Psychiatric/Behavioral: Positive for confusion. Negative for agitation.  The patient is nervous/anxious.        Anxiety and depression but this is currently controlled.     Filed Vitals:   02/17/16 1038  BP: 120/70  Pulse: 91  Temp: 97.4 F (36.3 C)  TempSrc: Oral  Resp: 20  Height: '5\' 3"'  (1.6 m)  Weight: 139 lb (63.05 kg)  SpO2: 95%   Wt Readings from Last 3 Encounters:  02/17/16 139 lb (63.05 kg)  08/19/15 135 lb (61.236 kg)  05/20/15 133 lb (60.328 kg)    Body mass index is 24.63 kg/(m^2).  Physical Exam  Constitutional: She is oriented to person, place, and time. She appears well-developed and well-nourished. No distress.    HENT:  Head: Normocephalic and atraumatic.  Mouth/Throat: Oropharynx is clear and moist. No oropharyngeal exudate.  Eyes: Conjunctivae are normal. Pupils are equal, round, and reactive to light.  Neck: Normal range of motion. Neck supple.  Cardiovascular: Normal rate, regular rhythm and normal heart sounds.   Pulmonary/Chest: Effort normal and breath sounds normal.  Abdominal: Soft. Bowel sounds are normal.  Musculoskeletal: She exhibits no edema or tenderness.  Neurological: She is alert and oriented to person, place, and time.  04/22/2015 MMSE 21/30. Failed clock drawing  Skin: Skin is warm and dry. Rash noted. She is not diaphoretic.  Psychiatric: She has a normal mood and affect. Her behavior is normal.     Labs reviewed: Lab Summary Latest Ref Rng 02/09/2016 04/17/2015 01/09/2015  Hemoglobin 12.0 - 16.0 g/dL (None) 13.7 13.4  Hematocrit 36 - 46 % (None) 40 40  White count - (None) 7.9 7.3  Platelet count 150 - 399 K/L (None) 229 246  Sodium 137 - 147 mmol/L 143 143 143  Potassium 3.4 - 5.3 mmol/L 4.2 4.4 4.3  Calcium - (None) (None) (None)  Phosphorus - (None) (None) (None)  Creatinine 0.5 - 1.1 mg/dL 0.8 0.7 0.7  AST 13 - 35 U/L '19 16 17  ' Alk Phos 25 - 125 U/L 59 77 66  Bilirubin - (None) (None) (None)  Glucose - 102 103 101  Cholesterol 0 - 200 mg/dL 253(A) 199 178  HDL cholesterol 35 - 70 mg/dL 61 66 54  Triglycerides 40 - 160 mg/dL 117 100 96  LDL Direct - (None) (None) (None)  LDL Calc - 169 113 105  Total protein - (None) (None) (None)  Albumin - (None) (None) (None)   No results found for: TSH Lab Results  Component Value Date   BUN 13 02/09/2016   BUN 15 04/17/2015   BUN 13 01/09/2015   Lab Results  Component Value Date   CREATININE 0.8 02/09/2016   CREATININE 0.7 04/17/2015   CREATININE 0.7 01/09/2015   Lab Results  Component Value Date   HGBA1C 5.8 04/17/2015   HGBA1C 5.9 01/09/2015       Assessment/Plan  1. Eczema - triamcinolone cream  (KENALOG) 0.1 %; Apply sparingly up to twice daily to rash until it is resolved  Dispense: 30 g; Refill: 4 -Return for office visit of skin is not cleared in 3 weeks.  2. Ganglion cyst Observation only  3. Essential hypertension Controlled  4. Hyperlipidemia Rise in total cholesterol and LDL following discontinuation pravastatin. Patient takes Vicodin as such that I skeptical that resuming statins will be of any help to her.  5. Hyperglycemia Normal recent lab  6. Memory loss MMSE next visit  7. Balance problem Using walker. No falls.  8. Allergic rhinitis, unspecified allergic rhinitis type Discharge nasal steroids and  decongestants. Will simply observe without medicine change at this time.

## 2016-02-19 ENCOUNTER — Other Ambulatory Visit: Payer: Self-pay

## 2016-02-19 MED ORDER — TRIAMCINOLONE ACETONIDE 0.1 % EX CREA: TOPICAL_CREAM | CUTANEOUS | Status: DC

## 2016-04-02 DIAGNOSIS — L84 Corns and callosities: Secondary | ICD-10-CM | POA: Diagnosis not present

## 2016-04-02 DIAGNOSIS — M79671 Pain in right foot: Secondary | ICD-10-CM | POA: Diagnosis not present

## 2016-04-02 DIAGNOSIS — M79672 Pain in left foot: Secondary | ICD-10-CM | POA: Diagnosis not present

## 2016-04-02 DIAGNOSIS — L602 Onychogryphosis: Secondary | ICD-10-CM | POA: Diagnosis not present

## 2016-05-17 ENCOUNTER — Other Ambulatory Visit: Payer: Self-pay | Admitting: *Deleted

## 2016-05-17 DIAGNOSIS — R413 Other amnesia: Secondary | ICD-10-CM

## 2016-05-17 MED ORDER — DONEPEZIL HCL 10 MG PO TABS: ORAL_TABLET | ORAL | Status: DC

## 2016-05-17 NOTE — Telephone Encounter (Signed)
[employer] Francis King 

## 2016-07-05 ENCOUNTER — Other Ambulatory Visit: Payer: Self-pay | Admitting: Internal Medicine

## 2016-08-24 ENCOUNTER — Encounter: Payer: Self-pay | Admitting: Internal Medicine

## 2016-08-24 ENCOUNTER — Non-Acute Institutional Stay: Payer: Medicare Other | Admitting: Internal Medicine

## 2016-08-24 VITALS — BP 130/84 | HR 70 | Temp 97.7°F | Ht 63.0 in | Wt 140.0 lb

## 2016-08-24 DIAGNOSIS — E785 Hyperlipidemia, unspecified: Secondary | ICD-10-CM | POA: Diagnosis not present

## 2016-08-24 DIAGNOSIS — I10 Essential (primary) hypertension: Secondary | ICD-10-CM

## 2016-08-24 DIAGNOSIS — R413 Other amnesia: Secondary | ICD-10-CM | POA: Diagnosis not present

## 2016-08-24 DIAGNOSIS — R739 Hyperglycemia, unspecified: Secondary | ICD-10-CM

## 2016-08-24 DIAGNOSIS — L309 Dermatitis, unspecified: Secondary | ICD-10-CM

## 2016-08-24 DIAGNOSIS — R351 Nocturia: Secondary | ICD-10-CM

## 2016-08-24 MED ORDER — OXYBUTYNIN CHLORIDE 5 MG PO TABS: ORAL_TABLET | ORAL | 3 refills | Status: DC

## 2016-08-24 NOTE — Progress Notes (Signed)
Facility  [facility]    Place of Service: Clinic (12)     Allergies  Allergen Reactions  . Ace Inhibitors   . Almond Oil   . Plum Pulp     Chief Complaint  Patient presents with  . Medical Management of Chronic Issues    6 month medication management blood pressure, hyperglycemia, memory. Here with husband.  . Other    MMSE 30/30 passed clock drawing    HPI:   Essential hypertension - controlled  Hyperglycemia - needs follow up   Hyperlipidemia - needs follow up  Memory loss - improved MMSE. Tolerating Aricept  Eczema - left shoulder. TCA cream helps.  Nocturia - no incontinence. 2-3 hoours between voiding. No dysuria.    Medications: Patient's Medications  New Prescriptions   No medications on file  Previous Medications   ASPIRIN 325 MG TABLET    Take 325 mg by mouth.    B COMPLEX-C-FOLIC ACID PO    Take by mouth. Take one tablet daily   CALCIUM-MAGNESIUM-ZINC 333-133-5 MG TABS    Take by mouth. Take one tablet daily   CHOLECALCIFEROL (VITAMIN D3) 1000 UNITS CAPS    Take by mouth. Take one tablet daily   DIPHENHYDRAMINE (BENADRYL) 25 MG TABLET    Take 25 mg by mouth.    DONEPEZIL (ARICEPT) 10 MG TABLET    One daily to help preserve memory  Modified Medications   No medications on file  Discontinued Medications   ALENDRONATE (FOSAMAX) 35 MG TABLET    Take one tablet by mouth with a full glass of water on an empty stomach once weekly for bones   FLUTICASONE (FLONASE) 50 MCG/ACT NASAL SPRAY    Place into both nostrils. Reported on 02/17/2016   TRIAMCINOLONE CREAM (KENALOG) 0.1 %    Apply sparingly up to twice daily to rash until it is resolved   TRIAMCINOLONE CREAM (KENALOG) 0.1 %    0.1 % 1 application twice daily until rash resolved     Review of Systems  Constitutional: Negative for activity change, appetite change, fatigue and unexpected weight change.  HENT: Positive for hearing loss and rhinorrhea (allergic rhinitis). Negative for congestion.   Eyes:  Negative.   Respiratory: Negative for cough and shortness of breath.   Cardiovascular: Negative for chest pain, palpitations and leg swelling.  Gastrointestinal: Negative for abdominal pain, constipation and diarrhea.  Genitourinary: Positive for frequency. Negative for difficulty urinating and dysuria.       Nocturia x 4  Musculoskeletal: Negative for arthralgias and myalgias.  Skin: Positive for rash. Negative for color change and wound.       Dry skin . Eczematous changes in the left arm and left leg and to a lesser degree the right thigh is involved.  Neurological: Positive for dizziness. Negative for weakness, light-headedness and headaches. Facial asymmetry: occasionally if she gets up too fast.       Memory loss  Hematological: Bruises/bleeds easily.  Psychiatric/Behavioral: Positive for confusion. Negative for agitation. The patient is nervous/anxious.        Anxiety and depression but this is currently controlled.     Vitals:   08/24/16 1051  BP: 130/84  Pulse: 70  Temp: 97.7 F (36.5 C)  TempSrc: Oral  SpO2: 95%  Weight: 140 lb (63.5 kg)  Height: _0  (1.6 m)   Wt Readings from Last 3 Encounters:  08/24/16 140 lb (63.5 kg)  02/17/16 139 lb (63 kg)  08/19/15 135 lb (61.2 kg)  Body mass index is 24.8 kg/m.  Physical Exam  Constitutional: She is oriented to person, place, and time. She appears well-developed and well-nourished. No distress.  HENT:  Head: Normocephalic and atraumatic.  Mouth/Throat: Oropharynx is clear and moist. No oropharyngeal exudate.  Eyes: Conjunctivae are normal. Pupils are equal, round, and reactive to light.  Neck: Normal range of motion. Neck supple.  Cardiovascular: Normal rate, regular rhythm and normal heart sounds.   Pulmonary/Chest: Effort normal and breath sounds normal.  Abdominal: Soft. Bowel sounds are normal.  Musculoskeletal: She exhibits no edema or tenderness.  Neurological: She is alert and oriented to person, place, and  time.  04/22/2015 MMSE 21/30. Failed clock drawing 08/24/16 MMSE 30/30. Passed clock drawing.  Skin: Skin is warm and dry. Rash (left shoulder) noted. She is not diaphoretic.  Psychiatric: She has a normal mood and affect. Her behavior is normal.     Labs reviewed: Lab Summary Latest Ref Rng & Units 02/09/2016 04/17/2015 01/09/2015  Hemoglobin 12.0 - 16.0 g/dL (None) 13.7 13.4  Hematocrit 36 - 46 % (None) 40 40  White count 10:3/mL (None) 7.9 7.3  Platelet count 150 - 399 K/L (None) 229 246  Sodium 137 - 147 mmol/L 143 143 143  Potassium 3.4 - 5.3 mmol/L 4.2 4.4 4.3  Calcium - (None) (None) (None)  Phosphorus - (None) (None) (None)  Creatinine 0.5 - 1.1 mg/dL 0.8 0.7 0.7  AST 13 - 35 U/L _0 Alk Phos 25 - 125 U/L 59 77 66  Bilirubin - (None) (None) (None)  Glucose mg/dL 102 103 101  Cholesterol 0 - 200 mg/dL 253(A) 199 178  HDL cholesterol 35 - 70 mg/dL 61 66 54  Triglycerides 40 - 160 mg/dL 117 100 96  LDL Direct - (None) (None) (None)  LDL Calc mg/dL 169 113 105  Total protein - (None) (None) (None)  Albumin - (None) (None) (None)  Some recent data might be hidden   No results found for: TSH Lab Results  Component Value Date   BUN 13 02/09/2016   BUN 15 04/17/2015   BUN 13 01/09/2015   Lab Results  Component Value Date   CREATININE 0.8 02/09/2016   CREATININE 0.7 04/17/2015   CREATININE 0.7 01/09/2015   Lab Results  Component Value Date   HGBA1C 5.8 04/17/2015   HGBA1C 5.9 01/09/2015    Assessment/Plan  1. Essential hypertension -CMP, future  2. Hyperglycemia -CMP, future  3. Hyperlipidemia -Lipids, future  4. Memory loss improved  5. Eczema -TCA cream  6. Nocturia -Oxybutynin 5 mg hs

## 2016-10-06 ENCOUNTER — Other Ambulatory Visit: Payer: Self-pay

## 2016-10-06 DIAGNOSIS — E7849 Other hyperlipidemia: Secondary | ICD-10-CM

## 2016-10-06 DIAGNOSIS — I10 Essential (primary) hypertension: Secondary | ICD-10-CM

## 2017-02-16 DIAGNOSIS — I10 Essential (primary) hypertension: Secondary | ICD-10-CM | POA: Diagnosis not present

## 2017-02-16 DIAGNOSIS — E784 Other hyperlipidemia: Secondary | ICD-10-CM | POA: Diagnosis not present

## 2017-02-17 ENCOUNTER — Other Ambulatory Visit: Payer: Medicare Other

## 2017-02-17 LAB — COMPLETE METABOLIC PANEL WITH GFR
ALK PHOS: 55 U/L (ref 33–130)
ALT: 11 U/L (ref 6–29)
AST: 14 U/L (ref 10–35)
Albumin: 4.1 g/dL (ref 3.6–5.1)
BILIRUBIN TOTAL: 0.6 mg/dL (ref 0.2–1.2)
BUN: 16 mg/dL (ref 7–25)
CALCIUM: 9.3 mg/dL (ref 8.6–10.4)
CO2: 27 mmol/L (ref 20–31)
Chloride: 108 mmol/L (ref 98–110)
Creat: 0.87 mg/dL (ref 0.60–0.88)
GFR, EST NON AFRICAN AMERICAN: 60 mL/min (ref >=60)
GFR, Est African American: 69 mL/min (ref >=60)
Glucose, Bld: 92 mg/dL (ref 65–99)
POTASSIUM: 4 mmol/L (ref 3.5–5.3)
Sodium: 143 mmol/L (ref 135–146)
Total Protein: 6.3 g/dL (ref 6.1–8.1)

## 2017-02-17 LAB — LIPID PANEL
CHOL/HDL RATIO: 5.1 ratio — AB (ref <5.0)
Cholesterol: 272 mg/dL — ABNORMAL HIGH (ref <200)
HDL: 53 mg/dL (ref >50)
LDL Cholesterol: 184 mg/dL — ABNORMAL HIGH (ref <100)
Triglycerides: 174 mg/dL — ABNORMAL HIGH (ref <150)
VLDL: 35 mg/dL — ABNORMAL HIGH (ref <30)

## 2017-02-22 ENCOUNTER — Encounter: Payer: Self-pay | Admitting: Internal Medicine

## 2017-02-22 ENCOUNTER — Non-Acute Institutional Stay: Payer: Medicare Other | Admitting: Internal Medicine

## 2017-02-22 VITALS — BP 130/82 | HR 83 | Temp 97.8°F | Ht 63.0 in | Wt 139.0 lb

## 2017-02-22 DIAGNOSIS — R413 Other amnesia: Secondary | ICD-10-CM | POA: Diagnosis not present

## 2017-02-22 DIAGNOSIS — E7849 Other hyperlipidemia: Secondary | ICD-10-CM

## 2017-02-22 DIAGNOSIS — L309 Dermatitis, unspecified: Secondary | ICD-10-CM

## 2017-02-22 DIAGNOSIS — R351 Nocturia: Secondary | ICD-10-CM

## 2017-02-22 DIAGNOSIS — I10 Essential (primary) hypertension: Secondary | ICD-10-CM | POA: Diagnosis not present

## 2017-02-22 DIAGNOSIS — E784 Other hyperlipidemia: Secondary | ICD-10-CM | POA: Diagnosis not present

## 2017-02-22 DIAGNOSIS — R739 Hyperglycemia, unspecified: Secondary | ICD-10-CM | POA: Diagnosis not present

## 2017-02-22 NOTE — Progress Notes (Signed)
Facility  [facility]    Place of Service: Clinic (12)     Allergies  Allergen Reactions  . Ace Inhibitors   . Almond Oil   . Plum Pulp     Chief Complaint  Patient presents with  . Medical Management of Chronic Issues    6 month medication management blood pressure, hyperglycemia, memory, lab review. Here with husband Ana [name].     HPI:  Last visit 08/24/16. Haws done well since then  Essential hypertension - controlled  Hyperglycemia - controlled  Hyperlipidemia - LDL and total cholesterol are high. We have chosen not to treat this because of her age and the lack of vascular or cardiac disease.  Memory loss - improved MMSE. Tolerating Aricept  Eczema - left shoulder and chest. Persistent scratchng of the left upper chest has caused a small ulcer. TCA cream helps, but she ran out of it.  Nocturia - no incontinence. 2-3 hoours between voiding. No dysuria.   Medications: Patient's Medications  New Prescriptions   No medications on file  Previous Medications   B COMPLEX-C-FOLIC ACID PO    Take by mouth. Take one tablet daily   CHOLECALCIFEROL (VITAMIN D3) 1000 UNITS CAPS    Take by mouth. Take one tablet daily   DIPHENHYDRAMINE (BENADRYL) 25 MG TABLET    Take 25 mg by mouth. As needed   DONEPEZIL (ARICEPT) 10 MG TABLET    One daily to help preserve memory   OXYBUTYNIN (DITROPAN) 5 MG TABLET    One nightly to reduce night time urination  Modified Medications   No medications on file  Discontinued Medications   ASPIRIN 325 MG TABLET    Take 325 mg by mouth.    CALCIUM-MAGNESIUM-ZINC 333-133-5 MG TABS    Take by mouth. Take one tablet daily     Review of Systems  Constitutional: Negative for activity change, appetite change, fatigue and unexpected weight change.  HENT: Positive for hearing loss and rhinorrhea (allergic rhinitis). Negative for congestion.   Eyes: Negative.   Respiratory: Negative for cough and shortness of breath.   Cardiovascular: Negative for  chest pain, palpitations and leg swelling.  Gastrointestinal: Negative for abdominal pain, constipation and diarrhea.  Genitourinary: Positive for frequency. Negative for difficulty urinating and dysuria.       Nocturia x 4  Musculoskeletal: Negative for arthralgias and myalgias.  Skin: Positive for rash. Negative for color change and wound.       Dry skin . Eczematous changes in the left arm and left leg and to a lesser degree the right thigh is involved. Small ulcer of the left upper ant. Chest.  Neurological: Positive for dizziness. Negative for weakness, light-headedness and headaches. Facial asymmetry: occasionally if she gets up too fast.       Memory loss  Hematological: Bruises/bleeds easily.  Psychiatric/Behavioral: Positive for confusion. Negative for agitation. The patient is nervous/anxious.        Anxiety and depression but this is currently controlled.     Vitals:   02/22/17 1140  BP: 130/82  Pulse: 83  Temp: 97.8 F (36.6 C)  TempSrc: Oral  SpO2: 97%  Weight: 139 lb (63 kg)  Height: '5\' 3"'  (1.6 m)   Wt Readings from Last 3 Encounters:  02/22/17 139 lb (63 kg)  08/24/16 140 lb (63.5 kg)  02/17/16 139 lb (63 kg)    Body mass index is 24.62 kg/m.  Physical Exam  Constitutional: She is oriented to person, place, and time. She  appears well-developed and well-nourished. No distress.  HENT:  Head: Normocephalic and atraumatic.  Mouth/Throat: Oropharynx is clear and moist. No oropharyngeal exudate.  Eyes: Conjunctivae are normal. Pupils are equal, round, and reactive to light.  Neck: Normal range of motion. Neck supple.  Cardiovascular: Normal rate, regular rhythm and normal heart sounds.   Pulmonary/Chest: Effort normal and breath sounds normal.  Abdominal: Soft. Bowel sounds are normal.  Musculoskeletal: She exhibits no edema or tenderness.  Neurological: She is alert and oriented to person, place, and time. She displays normal reflexes.  04/22/2015 MMSE 21/30.  Failed clock drawing 08/24/16 MMSE 30/30. Passed clock drawing.  Skin: Skin is warm and dry. Rash (left shoulder) noted. She is not diaphoretic.  Small ulcer of the upper ant. Left chest due to scratching.  Psychiatric: She has a normal mood and affect. Her behavior is normal.     Labs reviewed: Lab Summary Latest Ref Rng & Units 02/16/2017 02/09/2016 04/17/2015  Hemoglobin 12.0 - 16.0 g/dL (None) (None) 13.7  Hematocrit 36 - 46 % (None) (None) 40  White count 10:3/mL (None) (None) 7.9  Platelet count 150 - 399 K/L (None) (None) 229  Sodium 135 - 146 mmol/L 143 143 143  Potassium 3.5 - 5.3 mmol/L 4.0 4.2 4.4  Calcium 8.6 - 10.4 mg/dL 9.3 (None) (None)  Phosphorus - (None) (None) (None)  Creatinine 0.60 - 0.88 mg/dL 0.87 0.8 0.7  AST 10 - 35 U/L '14 19 16  ' Alk Phos 33 - 130 U/L 55 59 77  Bilirubin 0.2 - 1.2 mg/dL 0.6 (None) (None)  Glucose 65 - 99 mg/dL 92 102 103  Cholesterol <200 mg/dL 272(H) 253(A) 199  HDL cholesterol >50 mg/dL 53 61 66  Triglycerides <150 mg/dL 174(H) 117 100  LDL Direct - (None) (None) (None)  LDL Calc <100 mg/dL 184(H) 169 113  Total protein 6.1 - 8.1 g/dL 6.3 (None) (None)  Albumin 3.6 - 5.1 g/dL 4.1 (None) (None)  Some recent data might be hidden   No results found for: TSH Lab Results  Component Value Date   BUN 16 02/16/2017   BUN 13 02/09/2016   BUN 15 04/17/2015   Lab Results  Component Value Date   CREATININE 0.87 02/16/2017   CREATININE 0.8 02/09/2016   CREATININE 0.7 04/17/2015   Lab Results  Component Value Date   HGBA1C 5.8 04/17/2015   HGBA1C 5.9 01/09/2015    Assessment/Plan  1. Essential hypertension The current medical regimen is effective;  continue present plan and medications.  2. Hyperglycemia normal  3. Other hyperlipidemia I would leave this alone and not treat.  4. Eczema, unspecified type Refill TCA cream  5. Memory loss The current medical regimen is effective;  continue present plan and medications. MMSE next  visit.  6. Nocturia unchanged

## 2017-02-25 ENCOUNTER — Other Ambulatory Visit: Payer: Self-pay | Admitting: Internal Medicine

## 2017-04-26 ENCOUNTER — Encounter: Payer: Self-pay | Admitting: Internal Medicine

## 2017-05-19 ENCOUNTER — Other Ambulatory Visit: Payer: Self-pay | Admitting: Internal Medicine

## 2017-05-19 DIAGNOSIS — R413 Other amnesia: Secondary | ICD-10-CM

## 2017-05-24 ENCOUNTER — Encounter: Payer: Self-pay | Admitting: Internal Medicine

## 2017-08-05 ENCOUNTER — Telehealth: Payer: Self-pay | Admitting: Internal Medicine

## 2017-08-05 NOTE — Telephone Encounter (Signed)
Called to schedule AWV-I at [facility] clinic but no answer and unable to leave a message. VDM (DD) °

## 2017-08-11 NOTE — Telephone Encounter (Signed)
2nd attempt to call and schedule AWV-I but no answer and no option to leave a message. VDM (DD) °

## 2017-08-30 ENCOUNTER — Encounter: Payer: Self-pay | Admitting: Internal Medicine

## 2017-09-06 ENCOUNTER — Encounter: Payer: Self-pay | Admitting: Internal Medicine

## 2017-09-07 ENCOUNTER — Non-Acute Institutional Stay: Payer: Medicare Other | Admitting: Internal Medicine

## 2017-09-07 ENCOUNTER — Encounter: Payer: Self-pay | Admitting: Internal Medicine

## 2017-09-07 VITALS — BP 118/62 | HR 90 | Temp 97.9°F | Resp 16 | Ht 63.0 in | Wt 136.6 lb

## 2017-09-07 DIAGNOSIS — R413 Other amnesia: Secondary | ICD-10-CM | POA: Diagnosis not present

## 2017-09-07 DIAGNOSIS — M810 Age-related osteoporosis without current pathological fracture: Secondary | ICD-10-CM | POA: Diagnosis not present

## 2017-09-07 DIAGNOSIS — E784 Other hyperlipidemia: Secondary | ICD-10-CM | POA: Diagnosis not present

## 2017-09-07 DIAGNOSIS — I10 Essential (primary) hypertension: Secondary | ICD-10-CM

## 2017-09-07 DIAGNOSIS — E7849 Other hyperlipidemia: Secondary | ICD-10-CM

## 2017-09-07 DIAGNOSIS — N393 Stress incontinence (female) (male): Secondary | ICD-10-CM | POA: Diagnosis not present

## 2017-09-07 DIAGNOSIS — J309 Allergic rhinitis, unspecified: Secondary | ICD-10-CM | POA: Diagnosis not present

## 2017-09-07 MED ORDER — CALCIUM CARBONATE 600 MG PO TABS: 600.0000 mg | ORAL_TABLET | Freq: Two times a day (BID) | ORAL | 3 refills | Status: DC

## 2017-09-07 NOTE — Progress Notes (Signed)
[facility]  Provider: Blanchie Serve MD   Location:  [place] of Service:  Clinic (12)  PCP: Blanchie Serve, MD Patient Care Team: Blanchie Serve, MD as PCP - General (Internal Medicine) Ngetich, Nelda Bucks, NP as Nurse Practitioner (Family Medicine)  Extended Emergency Contact Information Primary Emergency Contact: Gordan,[place] Address: [address]          [place], [place] [address] Johnnette Litter of [place] Phone: [phone] Relation: Daughter Secondary Emergency Contact: Verneda Skill Address: [address]          [place], [place] [address] Johnnette Litter of [place] Phone: [phone] Relation: Spouse   Goals of Care: Advanced Directive information Advanced Directives 09/07/2017  Does Patient Have a Medical Advance Directive? Yes  Type of Paramedic of [place];Living will;Out of facility DNR (pink MOST or yellow form)  Does patient want to make changes to medical advance directive? -  Copy of [place] in Chart? Yes  Pre-existing out of facility DNR order (yellow form or pink MOST form) -      Chief Complaint  Patient presents with  . Medical Management of Chronic Issues    6 month follow up. Patient wants to know why she's taking Donzepril  . Medication Refill    No refills needed at this time.   . Other    Fill out handicap paperwork.     HPI: Patient is a 83 y.o. female seen today for routine visit.   Memory loss- currently taking donepezil. No acute behavior change reported. MMSE 08/24/16 30/30.   HTN- not on any medication, controlled BP reading.  Stress incontinence- with sneezing or coughing. Uses pads and diapers.   Osteoporosis- history of fracture to right femur and right humerus post fall may 2015 and July 2015. Using a walker at present.   Unsteady gait-  2 falls in 2015. No fall reported after that. Currently using walker with brake and seat.   Allergic  rhinitis- stable, not on any medication  Past Medical History:  Diagnosis Date  . Allergic rhinitis   . Anxiety   . Balance problem 04/28/2015  . Fatigue 04/28/2015  . Hearing loss 04/28/2015  . history of Fracture of right humerus 04/28/2015  . Hyperglycemia   . Hyperlipidemia   . Hypertension   . Memory loss 04/28/2015   04/26/2014 MMSE 28/30. Failed clock drawing. 04/21/15 MMSE 21/30. Failed clock drawing.   . Osteoporosis    Past Surgical History:  Procedure Laterality Date  . ABDOMINAL HYSTERECTOMY    . CATARACT EXTRACTION  2010  . ELBOW SURGERY Right 1988  . FEMUR FRACTURE SURGERY Right 06/2014   [place], [place] Dr. Dorita Fray  . HUMERUS FRACTURE SURGERY Right 2015   [place], [place] Dr. Noemi Chapel  . TONSILLECTOMY  1935    reports that she has never smoked. She has never used smokeless tobacco. She reports that she drinks about 0.6 - 1.2 oz of alcohol per week . She reports that she does not use drugs. Social History   Social History  . Marital status: Unknown    Spouse name: N/A  . Number of children: N/A  . Years of education: N/A   Occupational History  . Housewife    Social History Main Topics  . Smoking status: Never Smoker  . Smokeless tobacco: Never Used  . Alcohol use 0.6 - 1.2 oz/week    1 - 2 Glasses of wine per week  Comment: 1-2 glasses nightly   . Drug use: No  . Sexual activity: Not on file   Other Topics Concern  . Not on file   Social History Narrative   [facility] at [facility] since 11[facility]28/14   Married Elenore Rota   Never smoked   Alcohol -wine 1-2 daily   Caffeine coffee 2 daily   Exercise walking   Walks with walker   Living Will, POA, DNR    Functional Status Survey:    Family History  Problem Relation Age of Onset  . Heart disease Mother   . Diabetes Daughter     Health Maintenance  Topic Date Due  . Samul Dada  [birth year]/1947  . DEXA SCAN  03/21/1993  . PNA vac Low Risk Adult (2 of 2 - PCV13) 08/19/2012  . INFLUENZA  VACCINE  07/13/2017    Allergies  Allergen Reactions  . Ace Inhibitors   . Almond Oil   . Plum Pulp     Outpatient Encounter Prescriptions as of 09/07/2017  Medication Sig  . Cholecalciferol (VITAMIN D3) 1000 UNITS CAPS Take by mouth. Take one tablet daily  . diphenhydrAMINE (BENADRYL) 25 MG tablet Take 25 mg by mouth. As needed  . donepezil (ARICEPT) 10 MG tablet TAKE ONE TABLET BY MOUTH DAILY TO PRESERVE MEMORY  . vitamin B-12 (CYANOCOBALAMIN) 1000 MCG tablet Take 1,000 mcg by mouth daily.  Marland Kitchen triamcinolone cream (KENALOG) 0.1 % APPLY 1 APPLICATION TWICE DAILY UNTIL RASH RESOLVED (Patient not taking: Reported on 09/07/2017)  . [DISCONTINUED] B COMPLEX-C-FOLIC ACID PO Take by mouth. Take one tablet daily  . [DISCONTINUED] oxybutynin (DITROPAN) 5 MG tablet One nightly to reduce night time urination   No facility-administered encounter medications on file as of 09/07/2017.     Review of Systems  Constitutional: Negative for chills, diaphoresis and fever.  HENT: Positive for hearing loss and rhinorrhea. Negative for congestion, mouth sores, sore throat and trouble swallowing.   Eyes: Positive for visual disturbance. Negative for pain, discharge, redness and itching.       Has reading glasses  Respiratory: Negative for cough, choking, shortness of breath and wheezing.   Cardiovascular: Negative for chest pain, palpitations and leg swelling.  Gastrointestinal: Negative for abdominal pain, blood in stool, constipation, diarrhea, nausea and vomiting.  Genitourinary: Positive for frequency. Negative for dysuria and hematuria.  Musculoskeletal: Positive for gait problem. Negative for arthralgias and back pain.  Skin: Negative for rash.  Neurological: Negative for dizziness, seizures and headaches.  Hematological: Bruises/bleeds easily.  Psychiatric/Behavioral: Positive for confusion, decreased concentration and dysphoric mood. Negative for behavioral problems, hallucinations, self-injury,  sleep disturbance and suicidal ideas. The patient is nervous/anxious.     Vitals:   09/07/17 0918  BP: 118/62  Pulse: 90  Resp: 16  Temp: 97.9 F (36.6 C)  TempSrc: Oral  SpO2: 97%  Weight: 136 lb 9.6 oz (62 kg)  Height: _0  (1.6 m)   Body mass index is 24.2 kg/m.   Wt Readings from Last 3 Encounters:  09/07/17 136 lb 9.6 oz (62 kg)  02/22/17 139 lb (63 kg)  08/24/16 140 lb (63.5 kg)    Physical Exam  Constitutional: She is oriented to person, place, and time. She appears well-developed and well-nourished. No distress.  HENT:  Head: Normocephalic and atraumatic.  Right Ear: External ear normal.  Nose: Nose normal.  Mouth/Throat: Oropharynx is clear and moist. No oropharyngeal exudate.  Eyes: Pupils are equal, round, and reactive to light. Conjunctivae and EOM are normal. Left  eye exhibits no discharge. No scleral icterus.  Neck: Normal range of motion. Neck supple. No JVD present. No thyromegaly present.  Cardiovascular: Normal rate and regular rhythm.   Pulmonary/Chest: Effort normal and breath sounds normal. No respiratory distress. She has no wheezes. She has no rales. She exhibits no tenderness.  Abdominal: Soft. Bowel sounds are normal. She exhibits no distension. There is no tenderness. There is no guarding.  Musculoskeletal: She exhibits no edema.  Can move all 4 extremities, unsteady gait with limping to right leg, uses walker  Lymphadenopathy:    She has no cervical adenopathy.  Neurological: She is alert and oriented to person, place, and time.  MMSE 26/30, passed clock draw  Skin: Skin is warm and dry. No rash noted. She is not diaphoretic.  Psychiatric: She has a normal mood and affect. Her behavior is normal.    Labs reviewed: Basic Metabolic Panel:  Recent Labs  02/16/17 0001  NA 143  K 4.0  CL 108  CO2 27  GLUCOSE 92  BUN 16  CREATININE 0.87  CALCIUM 9.3   Liver Function Tests:  Recent Labs  02/16/17 0001  AST 14  ALT 11  ALKPHOS 55   BILITOT 0.6  PROT 6.3  ALBUMIN 4.1   No results for input(s): LIPASE, AMYLASE in the last 8760 hours. No results for input(s): AMMONIA in the last 8760 hours. CBC: No results for input(s): WBC, NEUTROABS, HGB, HCT, MCV, PLT in the last 8760 hours. Cardiac Enzymes: No results for input(s): CKTOTAL, CKMB, CKMBINDEX, TROPONINI in the last 8760 hours. BNP: Invalid input(s): POCBNP Lab Results  Component Value Date   HGBA1C 5.8 04/17/2015   No results found for: TSH No results found for: VITAMINB12 No results found for: FOLATE No results found for: IRON, TIBC, FERRITIN  Lipid Panel:  Recent Labs  02/16/17 0001  CHOL 272*  HDL 53  LDLCALC 184*  TRIG 174*  CHOLHDL 5.1*   Lab Results  Component Value Date   HGBA1C 5.8 04/17/2015    Procedures since last visit: No results found.  Assessment/Plan  1. Essential hypertension Stable BP, off all antihypertensive, monitor renal function and BP - CMP with eGFR; Future - Lipid Panel; Future - CBC with Differential/Platelets; Future  2. Allergic rhinitis, unspecified seasonality, unspecified trigger Stable symptom. D/c prn benadryl and consider loratadine if needed  3. Osteoporosis without current pathological fracture, unspecified osteoporosis type Currently on vitamin d supplement. Obtain dexa and vit d level. Add calcium supplement.  - Vitamin D, 25-hydroxy; Future - DG Bone Density; Future  4. Other hyperlipidemia Not on statin, check lipid panel to assess further - Lipid Panel; Future - CBC with Differential/Platelets; Future  5. Memory loss MMSE 26/30. Continue donepezil for now.  - CBC with Differential/Platelets; Future - TSH; Future  6. Stress incontinence Continue perineal care, kegel exercise reviewed.     Labs/tests ordered:  As above.   Next appointment: 6 months  Communication: reviewed care plan with patient and her husband    Blanchie Serve, MD Internal Medicine [place], [place] [address] Cell Phone (Monday-Friday 8 am - 5 pm): [phone] On Call: [phone] and follow prompts after 5 pm and on weekends Office Phone: [phone] Office Fax: [phone]

## 2017-09-08 ENCOUNTER — Encounter: Payer: Self-pay | Admitting: Internal Medicine

## 2017-09-14 DIAGNOSIS — I10 Essential (primary) hypertension: Secondary | ICD-10-CM | POA: Diagnosis not present

## 2017-09-14 DIAGNOSIS — R413 Other amnesia: Secondary | ICD-10-CM | POA: Diagnosis not present

## 2017-09-14 DIAGNOSIS — M810 Age-related osteoporosis without current pathological fracture: Secondary | ICD-10-CM | POA: Diagnosis not present

## 2017-09-16 LAB — COMPLETE METABOLIC PANEL WITHOUT GFR
AG Ratio: 1.5 (calc) (ref 1.0–2.5)
ALT: 10 U/L (ref 6–29)
AST: 13 U/L (ref 10–35)
Albumin: 3.9 g/dL (ref 3.6–5.1)
Alkaline phosphatase (APISO): 66 U/L (ref 33–130)
BUN: 18 mg/dL (ref 7–25)
CO2: 25 mmol/L (ref 20–32)
Calcium: 9.5 mg/dL (ref 8.6–10.4)
Chloride: 110 mmol/L (ref 98–110)
Creat: 0.79 mg/dL (ref 0.60–0.88)
GFR, Est African American: 77 mL/min/1.73m2
GFR, Est Non African American: 66 mL/min/1.73m2
Globulin: 2.6 g/dL (ref 1.9–3.7)
Glucose, Bld: 112 mg/dL — ABNORMAL HIGH (ref 65–99)
Potassium: 4.4 mmol/L (ref 3.5–5.3)
Sodium: 146 mmol/L (ref 135–146)
Total Bilirubin: 0.4 mg/dL (ref 0.2–1.2)
Total Protein: 6.5 g/dL (ref 6.1–8.1)

## 2017-09-16 LAB — CBC WITH DIFFERENTIAL/PLATELET
BASOS ABS: 43 {cells}/uL (ref 0–200)
Basophils Relative: 0.5 %
EOS ABS: 230 {cells}/uL (ref 15–500)
Eosinophils Relative: 2.7 %
HEMATOCRIT: 40 % (ref 35.0–45.0)
Hemoglobin: 13.4 g/dL (ref 11.7–15.5)
LYMPHS ABS: 3647 {cells}/uL (ref 850–3900)
MCH: 31.5 pg (ref 27.0–33.0)
MCHC: 33.5 g/dL (ref 32.0–36.0)
MCV: 93.9 fL (ref 80.0–100.0)
MPV: 10.9 fL (ref 7.5–12.5)
Monocytes Relative: 7.5 %
Neutro Abs: 3944 cells/uL (ref 1500–7800)
Neutrophils Relative %: 46.4 %
PLATELETS: 242 10*3/uL (ref 140–400)
RBC: 4.26 10*6/uL (ref 3.80–5.10)
RDW: 12.7 % (ref 11.0–15.0)
TOTAL LYMPHOCYTE: 42.9 %
WBC: 8.5 10*3/uL (ref 3.8–10.8)
WBCMIX: 638 {cells}/uL (ref 200–950)

## 2017-09-16 LAB — LIPID PANEL
Cholesterol: 246 mg/dL — ABNORMAL HIGH (ref <200)
HDL: 55 mg/dL (ref >50)
LDL CHOLESTEROL (CALC): 166 mg/dL — AB
Non-HDL Cholesterol (Calc): 191 mg/dL (calc) — ABNORMAL HIGH (ref <130)
TRIGLYCERIDES: 121 mg/dL (ref <150)
Total CHOL/HDL Ratio: 4.5 (calc) (ref <5.0)

## 2017-09-16 LAB — TSH: TSH: 2.18 mIU/L (ref 0.40–4.50)

## 2017-09-16 LAB — VITAMIN D 25 HYDROXY (VIT D DEFICIENCY, FRACTURES): Vit D, 25-Hydroxy: 44 ng/mL (ref 30–100)

## 2017-10-09 ENCOUNTER — Emergency Department (HOSPITAL_COMMUNITY): Payer: Medicare Other

## 2017-10-09 ENCOUNTER — Encounter (HOSPITAL_COMMUNITY): Payer: Self-pay | Admitting: Emergency Medicine

## 2017-10-09 ENCOUNTER — Inpatient Hospital Stay (HOSPITAL_COMMUNITY)
Admission: EM | Admit: 2017-10-09 | Discharge: 2017-10-12 | DRG: 871 | Disposition: A | Discharge status: Skilled Nursing Facility | Payer: Medicare Other | Attending: Internal Medicine | Admitting: Internal Medicine

## 2017-10-09 DIAGNOSIS — N3001 Acute cystitis with hematuria: Secondary | ICD-10-CM

## 2017-10-09 DIAGNOSIS — R413 Other amnesia: Secondary | ICD-10-CM | POA: Diagnosis present

## 2017-10-09 DIAGNOSIS — N390 Urinary tract infection, site not specified: Secondary | ICD-10-CM | POA: Diagnosis not present

## 2017-10-09 DIAGNOSIS — M5126 Other intervertebral disc displacement, lumbar region: Secondary | ICD-10-CM | POA: Diagnosis not present

## 2017-10-09 DIAGNOSIS — M419 Scoliosis, unspecified: Secondary | ICD-10-CM | POA: Diagnosis present

## 2017-10-09 DIAGNOSIS — E785 Hyperlipidemia, unspecified: Secondary | ICD-10-CM | POA: Diagnosis not present

## 2017-10-09 DIAGNOSIS — R7989 Other specified abnormal findings of blood chemistry: Secondary | ICD-10-CM | POA: Diagnosis present

## 2017-10-09 DIAGNOSIS — A419 Sepsis, unspecified organism: Secondary | ICD-10-CM | POA: Diagnosis present

## 2017-10-09 DIAGNOSIS — Z9849 Cataract extraction status, unspecified eye: Secondary | ICD-10-CM

## 2017-10-09 DIAGNOSIS — G9341 Metabolic encephalopathy: Secondary | ICD-10-CM | POA: Diagnosis not present

## 2017-10-09 DIAGNOSIS — Z7982 Long term (current) use of aspirin: Secondary | ICD-10-CM | POA: Diagnosis not present

## 2017-10-09 DIAGNOSIS — H9190 Unspecified hearing loss, unspecified ear: Secondary | ICD-10-CM | POA: Diagnosis present

## 2017-10-09 DIAGNOSIS — A4189 Other specified sepsis: Principal | ICD-10-CM | POA: Diagnosis present

## 2017-10-09 DIAGNOSIS — Z833 Family history of diabetes mellitus: Secondary | ICD-10-CM

## 2017-10-09 DIAGNOSIS — F0390 Unspecified dementia without behavioral disturbance: Secondary | ICD-10-CM | POA: Diagnosis present

## 2017-10-09 DIAGNOSIS — N179 Acute kidney failure, unspecified: Secondary | ICD-10-CM

## 2017-10-09 DIAGNOSIS — I700 Atherosclerosis of aorta: Secondary | ICD-10-CM | POA: Diagnosis present

## 2017-10-09 DIAGNOSIS — B964 Proteus (mirabilis) (morganii) as the cause of diseases classified elsewhere: Secondary | ICD-10-CM | POA: Diagnosis present

## 2017-10-09 DIAGNOSIS — M4856XA Collapsed vertebra, not elsewhere classified, lumbar region, initial encounter for fracture: Secondary | ICD-10-CM | POA: Diagnosis not present

## 2017-10-09 DIAGNOSIS — M48061 Spinal stenosis, lumbar region without neurogenic claudication: Secondary | ICD-10-CM | POA: Diagnosis not present

## 2017-10-09 DIAGNOSIS — E7849 Other hyperlipidemia: Secondary | ICD-10-CM | POA: Diagnosis not present

## 2017-10-09 DIAGNOSIS — M810 Age-related osteoporosis without current pathological fracture: Secondary | ICD-10-CM | POA: Diagnosis not present

## 2017-10-09 DIAGNOSIS — R6520 Severe sepsis without septic shock: Secondary | ICD-10-CM | POA: Diagnosis present

## 2017-10-09 DIAGNOSIS — R509 Fever, unspecified: Secondary | ICD-10-CM | POA: Diagnosis not present

## 2017-10-09 DIAGNOSIS — E860 Dehydration: Secondary | ICD-10-CM | POA: Diagnosis present

## 2017-10-09 DIAGNOSIS — Z91048 Other nonmedicinal substance allergy status: Secondary | ICD-10-CM

## 2017-10-09 DIAGNOSIS — Z66 Do not resuscitate: Secondary | ICD-10-CM | POA: Diagnosis present

## 2017-10-09 DIAGNOSIS — Z888 Allergy status to other drugs, medicaments and biological substances status: Secondary | ICD-10-CM

## 2017-10-09 DIAGNOSIS — I10 Essential (primary) hypertension: Secondary | ICD-10-CM | POA: Diagnosis not present

## 2017-10-09 DIAGNOSIS — E876 Hypokalemia: Secondary | ICD-10-CM | POA: Diagnosis present

## 2017-10-09 DIAGNOSIS — S32009A Unspecified fracture of unspecified lumbar vertebra, initial encounter for closed fracture: Secondary | ICD-10-CM | POA: Diagnosis present

## 2017-10-09 DIAGNOSIS — Z8249 Family history of ischemic heart disease and other diseases of the circulatory system: Secondary | ICD-10-CM

## 2017-10-09 DIAGNOSIS — Z90710 Acquired absence of both cervix and uterus: Secondary | ICD-10-CM

## 2017-10-09 DIAGNOSIS — W19XXXA Unspecified fall, initial encounter: Secondary | ICD-10-CM

## 2017-10-09 DIAGNOSIS — M545 Low back pain: Secondary | ICD-10-CM | POA: Diagnosis not present

## 2017-10-09 DIAGNOSIS — N393 Stress incontinence (female) (male): Secondary | ICD-10-CM | POA: Diagnosis present

## 2017-10-09 DIAGNOSIS — M4805 Spinal stenosis, thoracolumbar region: Secondary | ICD-10-CM | POA: Diagnosis not present

## 2017-10-09 LAB — CBC WITH DIFFERENTIAL/PLATELET
Basophils Absolute: 0 10*3/uL (ref 0.0–0.1)
Basophils Relative: 0 %
EOS ABS: 0 10*3/uL (ref 0.0–0.7)
EOS PCT: 0 %
HCT: 39 % (ref 36.0–46.0)
Hemoglobin: 13 g/dL (ref 12.0–15.0)
LYMPHS ABS: 0.4 10*3/uL — AB (ref 0.7–4.0)
Lymphocytes Relative: 3 %
MCH: 32.3 pg (ref 26.0–34.0)
MCHC: 33.3 g/dL (ref 30.0–36.0)
MCV: 96.8 fL (ref 78.0–100.0)
MONO ABS: 0.1 10*3/uL (ref 0.1–1.0)
Monocytes Relative: 1 %
Neutro Abs: 12 10*3/uL — ABNORMAL HIGH (ref 1.7–7.7)
Neutrophils Relative %: 96 %
PLATELETS: 202 10*3/uL (ref 150–400)
RBC: 4.03 MIL/uL (ref 3.87–5.11)
RDW: 13.7 % (ref 11.5–15.5)
WBC: 12.5 10*3/uL — AB (ref 4.0–10.5)

## 2017-10-09 LAB — URINALYSIS, ROUTINE W REFLEX MICROSCOPIC
Bilirubin Urine: NEGATIVE
GLUCOSE, UA: NEGATIVE mg/dL
Ketones, ur: 20 mg/dL — AB
NITRITE: POSITIVE — AB
PH: 7 (ref 5.0–8.0)
Protein, ur: 100 mg/dL — AB
SPECIFIC GRAVITY, URINE: 1.018 (ref 1.005–1.030)
Squamous Epithelial / LPF: NONE SEEN

## 2017-10-09 LAB — COMPREHENSIVE METABOLIC PANEL
ALT: 26 U/L (ref 14–54)
AST: 45 U/L — AB (ref 15–41)
Albumin: 3.5 g/dL (ref 3.5–5.0)
Alkaline Phosphatase: 56 U/L (ref 38–126)
Anion gap: 13 (ref 5–15)
BILIRUBIN TOTAL: 2 mg/dL — AB (ref 0.3–1.2)
BUN: 22 mg/dL — AB (ref 6–20)
CHLORIDE: 104 mmol/L (ref 101–111)
CO2: 20 mmol/L — ABNORMAL LOW (ref 22–32)
CREATININE: 1.69 mg/dL — AB (ref 0.44–1.00)
Calcium: 8.7 mg/dL — ABNORMAL LOW (ref 8.9–10.3)
GFR calc Af Amer: 30 mL/min — ABNORMAL LOW (ref >60)
GFR, EST NON AFRICAN AMERICAN: 26 mL/min — AB (ref >60)
GLUCOSE: 181 mg/dL — AB (ref 65–99)
Potassium: 4.9 mmol/L (ref 3.5–5.1)
Sodium: 137 mmol/L (ref 135–145)
Total Protein: 6.8 g/dL (ref 6.5–8.1)

## 2017-10-09 LAB — I-STAT CG4 LACTIC ACID, ED: Lactic Acid, Venous: 3.78 mmol/L (ref 0.5–1.9)

## 2017-10-09 LAB — I-STAT TROPONIN, ED: TROPONIN I, POC: 0.05 ng/mL (ref 0.00–0.08)

## 2017-10-09 LAB — LACTIC ACID, PLASMA: Lactic Acid, Venous: 1.4 mmol/L (ref 0.5–1.9)

## 2017-10-09 LAB — LIPASE, BLOOD: Lipase: 19 U/L (ref 11–51)

## 2017-10-09 MED ORDER — ENOXAPARIN SODIUM 30 MG/0.3ML ~~LOC~~ SOLN
30.0000 mg | SUBCUTANEOUS | Status: DC
  Administered 2017-10-09 – 2017-10-11 (×3): 30 mg via SUBCUTANEOUS
  Filled 2017-10-09 (×3): qty 0.3

## 2017-10-09 MED ORDER — VITAMIN B-12 1000 MCG PO TABS
1000.0000 ug | ORAL_TABLET | Freq: Every day | ORAL | Status: DC
  Administered 2017-10-09 – 2017-10-12 (×4): 1000 ug via ORAL
  Filled 2017-10-09 (×4): qty 1

## 2017-10-09 MED ORDER — SODIUM CHLORIDE 0.9 % IV BOLUS (SEPSIS)
1000.0000 mL | Freq: Once | INTRAVENOUS | Status: AC
  Administered 2017-10-09: 1000 mL via INTRAVENOUS

## 2017-10-09 MED ORDER — PIPERACILLIN-TAZOBACTAM IN DEX 2-0.25 GM/50ML IV SOLN
2.2500 g | Freq: Three times a day (TID) | INTRAVENOUS | Status: DC
  Administered 2017-10-09 – 2017-10-10 (×2): 2.25 g via INTRAVENOUS
  Filled 2017-10-09 (×5): qty 50

## 2017-10-09 MED ORDER — ASPIRIN EC 325 MG PO TBEC
325.0000 mg | DELAYED_RELEASE_TABLET | Freq: Every day | ORAL | Status: DC
  Administered 2017-10-09 – 2017-10-12 (×4): 325 mg via ORAL
  Filled 2017-10-09 (×4): qty 1

## 2017-10-09 MED ORDER — DIPHENHYDRAMINE HCL 25 MG PO TABS
25.0000 mg | ORAL_TABLET | Freq: Four times a day (QID) | ORAL | Status: DC | PRN
  Filled 2017-10-09: qty 1

## 2017-10-09 MED ORDER — PIPERACILLIN-TAZOBACTAM 3.375 G IVPB 30 MIN
3.3750 g | Freq: Once | INTRAVENOUS | Status: AC
  Administered 2017-10-09: 3.375 g via INTRAVENOUS
  Filled 2017-10-09: qty 50

## 2017-10-09 MED ORDER — SODIUM CHLORIDE 0.9 % IV SOLN
INTRAVENOUS | Status: DC
  Administered 2017-10-09 – 2017-10-11 (×2): via INTRAVENOUS

## 2017-10-09 MED ORDER — DIPHENHYDRAMINE HCL 25 MG PO CAPS: 25.0000 mg | ORAL_CAPSULE | Freq: Four times a day (QID) | ORAL | Status: DC | PRN

## 2017-10-09 MED ORDER — VANCOMYCIN HCL IN DEXTROSE 1-5 GM/200ML-% IV SOLN
1000.0000 mg | Freq: Once | INTRAVENOUS | Status: AC
Start: 2017-10-09 — End: 2017-10-09
  Administered 2017-10-09: 1000 mg via INTRAVENOUS
  Filled 2017-10-09: qty 200

## 2017-10-09 MED ORDER — SODIUM CHLORIDE 0.9 % IV SOLN
500.0000 mg | INTRAVENOUS | Status: DC
  Filled 2017-10-09 (×3): qty 500

## 2017-10-09 MED ORDER — DONEPEZIL HCL 10 MG PO TABS
10.0000 mg | ORAL_TABLET | Freq: Every day | ORAL | Status: DC
  Administered 2017-10-09 – 2017-10-11 (×3): 10 mg via ORAL
  Filled 2017-10-09 (×3): qty 1

## 2017-10-09 MED ORDER — SODIUM CHLORIDE 0.9 % IV BOLUS (SEPSIS)
1000.0000 mL | Freq: Once | INTRAVENOUS | Status: AC
  Administered 2017-10-09: 500 mL via INTRAVENOUS

## 2017-10-09 MED ORDER — HYDROGEN PEROXIDE 3 % EX SOLN
CUTANEOUS | Status: AC
  Filled 2017-10-09: qty 473

## 2017-10-09 NOTE — ED Notes (Signed)
[facility] @ [phone], [phone]

## 2017-10-09 NOTE — ED Notes (Signed)
Bed: WA07 Expected date:  Expected time:  Means of arrival:  Comments: ? UTI, htn, fever

## 2017-10-09 NOTE — ED Triage Notes (Signed)
[facility], [facility]-due to strong foul urine odor noted. patient has 20g in left forearm.  Pt had Tylenol 60mg  PO in route. Patient has dementia per husband.

## 2017-10-09 NOTE — Progress Notes (Signed)
Pharmacy Antibiotic Note  Ana [name] is a 83 y.o. female admitted on 10/09/2017 with sepsis, UTI.  Pharmacy has been consulted for Zosyn and vancomycin dosing.   Patient received Zosyn 3.375 grams and vancomycin 1 gram in ED.   SCr elevation noted.  Plan: 1. Zosyn 2.25 grams IV q8h, next dose 10 pm 2. Vancomycin 500 mg IV q24h, next dose tomorrow 8 am 3. Suggest considering changing Zosyn to cefepime to reduce risk for vancomycin-associated nephrotoxicity 4. SCr daily 5. Follow culture results, clinical course   Height: 5\' 3"  (160 cm) Weight: 134 lb 11.2 oz (61.1 kg) IBW/kg (Calculated) : 52.4  Temp (24hrs), Avg:100.2 F (37.9 C), Min:98.3 F (36.8 C), Max:102.1 F (38.9 C)   Recent Labs Lab 10/09/17 1551 10/09/17 1555  WBC  --  12.5*  CREATININE  --  1.69*  LATICACIDVEN 3.78*  --     Estimated Creatinine Clearance: 18.7 mL/min (A) (by C-G formula based on SCr of 1.69 mg/dL (H)).    Allergies  Allergen Reactions  . Ace Inhibitors   . Almond Oil   . Plum Pulp    .Antimicrobials this admission:  10/28 >> Zosyn 10/28 >> vanco  Dose adjustments this admission:  n/a  Microbiology results:  10/28 BCx: collected 10/28 UCx: collected   Thank you for allowing pharmacy to be a part of this patient's care. Elie Goody. Randy Xue Low, PharmD, BCPS Pager: [phone] 10/09/2017  7:12 PM

## 2017-10-09 NOTE — ED Notes (Signed)
Patient transported to X-ray 

## 2017-10-09 NOTE — ED Notes (Signed)
EDP and RN made aware of critical istat result 

## 2017-10-09 NOTE — ED Provider Notes (Signed)
[facility]-EMERGENCY DEPT Provider Note   CSN: [id number] Arrival date & time: 10/09/17  1516     History   Chief Complaint Chief Complaint  Patient presents with  . Fever  . possible UTI   Level V caveat due to dementia  HPI Ana [name] is a 83 y.o. female with history of HTN, dementia and stress bladder incontinence presents to the ED via EMS for evaluation of foul-smelling urine. Per ED RN who obtained report from EMS, patient has been more confused in the last 24 hours and nursing home staff noticed she did not get out of bed all day yesterday.  HPI  Past Medical History:  Diagnosis Date  . Allergic rhinitis   . Anxiety   . Balance problem 04/28/2015  . Fatigue 04/28/2015  . Hearing loss 04/28/2015  . history of Fracture of right humerus 04/28/2015  . Hyperglycemia   . Hyperlipidemia   . Hypertension   . Memory loss 04/28/2015   04/26/2014 MMSE 28/30. Failed clock drawing. 04/21/15 MMSE 21/30. Failed clock drawing.   . Osteoporosis     Patient Active Problem List   Diagnosis Date Noted  . Stress incontinence 09/07/2017  . Nocturia 08/24/2016  . Ganglion cyst 02/17/2016  . Eczema 02/17/2016  . history of Fracture of right humerus 04/28/2015  . Memory loss 04/28/2015  . Hearing loss 04/28/2015  . Balance problem 04/28/2015  . Varicose veins 04/28/2015  . History of Fracture of right lower leg 04/28/2015  . Hyperglycemia   . Allergy   . Allergic rhinitis   . Osteoporosis   . Anxiety   . Hyperlipidemia   . Hypertension     Past Surgical History:  Procedure Laterality Date  . ABDOMINAL HYSTERECTOMY    . CATARACT EXTRACTION  2010  . ELBOW SURGERY Right 1988  . FEMUR FRACTURE SURGERY Right 06/2014   [place], [place] Dr. Dione Housekeeper  . HUMERUS FRACTURE SURGERY Right 2015   [place], [place] Dr. Eber Hong  . TONSILLECTOMY  1935    OB History    No data available       Home Medications    Prior to Admission medications     Medication Sig Start Date End Date Taking? Authorizing Provider  calcium carbonate (OS-CAL) 600 MG TABS tablet Take 1 tablet (600 mg total) by mouth 2 (two) times daily with a meal. 09/07/17   Oneal Grout, MD  Cholecalciferol (VITAMIN D3) 1000 UNITS CAPS Take by mouth. Take one tablet daily    [provider]  donepezil (ARICEPT) 10 MG tablet TAKE ONE TABLET BY MOUTH DAILY TO PRESERVE MEMORY 05/19/17   Kimber Relic, MD  vitamin B-12 (CYANOCOBALAMIN) 1000 MCG tablet Take 1,000 mcg by mouth daily.    [provider]    Family History Family History  Problem Relation Age of Onset  . Heart disease Mother   . Diabetes Daughter     Social History Social History  Substance Use Topics  . Smoking status: Never Smoker  . Smokeless tobacco: Never Used  . Alcohol use 0.6 - 1.2 oz/week    1 - 2 Glasses of wine per week     Comment: 1-2 glasses nightly      Allergies   Ace inhibitors; Almond oil; and Plum pulp   Review of Systems Review of Systems  Unable to perform ROS: Dementia  Constitutional: Positive for fever.  Genitourinary:       +foul smelling urine  Psychiatric/Behavioral: Positive for confusion.  Physical Exam Updated Vital Signs BP (!) 142/88 (BP Location: Left Arm)   Pulse (!) 112   Temp (!) 102.1 F (38.9 C) (Rectal)   SpO2 94%   Physical Exam  Constitutional: She appears well-developed and well-nourished.  Alert and oriented to self only. Rocking around in bed trying to rip off EKG leads.   HENT:  Head: Normocephalic and atraumatic.  Right Ear: External ear normal.  Left Ear: External ear normal.  Nose: Nose normal.  Dry mucous membranes  Eyes: Conjunctivae are normal.  PERRL intact bilaterally EOMs grossly normal   Neck: Normal range of motion.  Cardiovascular: Regular rhythm, S1 normal, S2 normal and normal heart sounds.  Tachycardia present.   No murmur heard. Pulses:      Radial pulses are 2+ on the right side, and 2+ on  the left side.       Dorsalis pedis pulses are 2+ on the right side, and 2+ on the left side.  No LE edema  Pulmonary/Chest: Effort normal and breath sounds normal. She has no decreased breath sounds. She has no wheezes.  Difficult examination as pt is rocking back and forth in bed, questionable decreased breath sounds lower lobes bilaterally Normal breathing effort   Abdominal: Soft. Normal appearance and bowel sounds are normal. There is no tenderness.  Musculoskeletal: Normal range of motion. She exhibits no deformity.  Neurological: She is alert. She is disoriented.  Pt moving all four extremities in coordinated fashion Oriented only to self  Skin: Skin is warm and dry. Capillary refill takes less than 2 seconds.  Skin hot to touch, no diaphoresis  Thorough skin check with RN at bedside shows no signs of cellulitis on skin   Psychiatric: She has a normal mood and affect. Her behavior is normal. Judgment and thought content normal.  Nursing note and vitals reviewed.    ED Treatments / Results  Labs (all labs ordered are listed, but only abnormal results are displayed) Labs Reviewed  COMPREHENSIVE METABOLIC PANEL - Abnormal; Notable for the following:       Result Value   CO2 20 (*)    Glucose, Bld 181 (*)    BUN 22 (*)    Creatinine, Ser 1.69 (*)    Calcium 8.7 (*)    AST 45 (*)    Total Bilirubin 2.0 (*)    GFR calc non Af Amer 26 (*)    GFR calc Af Amer 30 (*)    All other components within normal limits  CBC WITH DIFFERENTIAL/PLATELET - Abnormal; Notable for the following:    WBC 12.5 (*)    Neutro Abs 12.0 (*)    Lymphs Abs 0.4 (*)    All other components within normal limits  URINALYSIS, ROUTINE W REFLEX MICROSCOPIC - Abnormal; Notable for the following:    APPearance HAZY (*)    Hgb urine dipstick MODERATE (*)    Ketones, ur 20 (*)    Protein, ur 100 (*)    Nitrite POSITIVE (*)    Leukocytes, UA LARGE (*)    Bacteria, UA MANY (*)    All other components within  normal limits  I-STAT CG4 LACTIC ACID, ED - Abnormal; Notable for the following:    Lactic Acid, Venous 3.78 (*)    All other components within normal limits  CULTURE, BLOOD (ROUTINE X 2)  CULTURE, BLOOD (ROUTINE X 2)  URINE CULTURE  LIPASE, BLOOD  I-STAT TROPONIN, ED    EKG  EKG Interpretation  Date/Time:  Sunday October 09 2017 15:59:46 EDT Ventricular Rate:  112 PR Interval:    QRS Duration: 84 QT Interval:  312 QTC Calculation: 426 R Axis:   -6 Text Interpretation:  Sinus tachycardia Atrial premature complex Nonspecific repol abnormality, diffuse leads Baseline wander in lead(s) V2 No previous ECGs available Confirmed by Vanetta Mulders [phone]) on 10/09/2017 4:09:04 PM       Radiology Dg Chest 2 View  Result Date: 10/09/2017 CLINICAL DATA:  Dementia and sepsis EXAM: CHEST  2 VIEW COMPARISON:  None. FINDINGS: The patient is rotated. There is no lobar consolidation or pulmonary edema. No pleural effusion or pneumothorax. There is multilevel thoracic and lumbar vertebral body height loss, including age-indeterminate compression fracture of the lower lumbar spine with associated grade 1 anterolisthesis. IMPRESSION: 1. No active cardiopulmonary disease. 2. Age indeterminate compression fracture of a lower lumbar vertebra with associated grade 1 anterolisthesis. There is also moderate to advanced vertebral body height loss at multiple levels of the thoracic spine. If there has been recent trauma or other reason to suspect an acute fracture of the vertebral column, consider MRI. Electronically Signed   By: Deatra Robinson M.D.   On: 10/09/2017 16:45    Procedures Procedures (including critical care time)  Medications Ordered in ED Medications  piperacillin-tazobactam (ZOSYN) IVPB 3.375 g (not administered)  vancomycin (VANCOCIN) IVPB 1000 mg/200 mL premix (not administered)  sodium chloride 0.9 % bolus 1,000 mL (not administered)  sodium chloride 0.9 % bolus 1,000 mL (not  administered)     Initial Impression / Assessment and Plan / ED Course  I have reviewed the triage vital signs and the nursing notes.  Pertinent labs & imaging results that were available during my care of the patient were reviewed by me and considered in my medical decision making (see chart for details).  Clinical Course as of Oct 09 1648  Wynelle Link Oct 09, 2017  1646 Hgb urine dipstick: (!) MODERATE [CG]  1646 Nitrite: (!) POSITIVE [CG]  1646 Leukocytes, UA: (!) LARGE [CG]  1646 WBC, UA: TOO NUMEROUS TO COUNT [CG]  1646 Bacteria, UA: (!) MANY [CG]  1646 Creatinine: (!) 1.69 [CG]  1646 BUN: (!) 22 [CG]  1646 GFR, Est Non African American: (!) 26 [CG]  1646 WBC: (!) 12.5 [CG]  1646 Lactic Acid, Venous: (!!) 3.78 [CG]    Clinical Course User Index [CG] Liberty Handy, PA-C   83 year old female presents with symptoms concerning for possible UTI. On exam, she is febrile and tachycardic. She is alert and oriented only to self. Exam is otherwise unremarkable. Lab work today is remarkable for leukocytosis, elevated lactic acid and urinary tract infection. Slightly elevated creatinine compared to previous. Abscess code was initiated, she is receiving 2 L IV fluids and broad-spectrum antibiotics.  We'll request admission. Pending chest x-ray. Blood and urine culture sent. Final Clinical Impressions(s) / ED Diagnoses   Final diagnoses:  Acute cystitis with hematuria    New Prescriptions New Prescriptions   No medications on file     Jerrell Mylar 10/09/17 1649    Vanetta Mulders, MD 10/21/17 1314

## 2017-10-09 NOTE — Progress Notes (Signed)
A consult was received from an ED physician for vancomycin and Zosyn per pharmacy dosing.  The patient's profile has been reviewed for ht/wt/allergies/indication/available labs.   The EDP had placed orders for Zosyn 3.375 grams IV x 1 and vancomycin 1 gram IV x 1.  Further antibiotics/pharmacy consults should be ordered by admitting physician if indicated.                       Thank you,.  Elie Goodyandy Lashandra Arauz, PharmD, BCPS Pager: [phone] 10/09/2017  3:56 PM

## 2017-10-09 NOTE — H&P (Signed)
History and Physical    Ana [name] DOB: 02/27/1926 DOA: 10/09/2017  PCP: Oneal GroutPandey, Mahima, MD  [facility]   I have personally briefly reviewed patient's [facility] medical [facility]  Chief Complaint: fever confusion.   HPI: Ana [name] is a 83 y.o. female with medical history significant of hypertension, dementia hyperlipidemia, was brought in for fever, confusion. As per the family at bedside, pt was fine until yesterday, and all day today did not eat and had chills, fevers and was confused.  On arrival to ED, she was febrile, tachycardic and labs revealed elevated wbc count, elevated lactic acid level. Bmp shows elevated BUN and creatinine of 1.69.  She is alert and able to answer simple questions, and most of the history provided by the husband at bedside.    Review of Systems: As per HPI otherwise 10 point review of systems negative. History provided by the husband at bedside.    Past Medical History:  Diagnosis Date  . Allergic rhinitis   . Anxiety   . Balance problem 04/28/2015  . Fatigue 04/28/2015  . Hearing loss 04/28/2015  . history of Fracture of right humerus 04/28/2015  . Hyperglycemia   . Hyperlipidemia   . Hypertension   . Memory loss 04/28/2015   04/26/2014 MMSE 28/30. Failed clock drawing. 04/21/15 MMSE 21/30. Failed clock drawing.   . Osteoporosis     Past Surgical History:  Procedure Laterality Date  . ABDOMINAL HYSTERECTOMY    . CATARACT EXTRACTION  2010  . ELBOW SURGERY Right 1988  . FEMUR FRACTURE SURGERY Right 06/2014   [place] Dr. Dione HousekeeperJohn Vanderhoof  . HUMERUS FRACTURE SURGERY Right 2015   [place], [place] Dr. Eber HongBrian Miller  . TONSILLECTOMY  1935     reports that she has never smoked. She has never used smokeless tobacco. She reports that she drinks about 0.6 - 1.2 oz of alcohol per week . She reports that she does not use drugs.  Allergies  Allergen Reactions  . Ace Inhibitors   .  Almond Oil   . Plum Pulp     Family History  Problem Relation Age of Onset  . Heart disease Mother   . Diabetes Daughter    reviewed and not pertinent.   Prior to Admission medications   Medication Sig Start Date End Date Taking? Authorizing Provider  aspirin 325 MG EC tablet Take 325 mg by mouth daily.   Yes [provider]  Cholecalciferol (VITAMIN D3) 1000 UNITS CAPS Take by mouth. Take one tablet daily   Yes [provider]  diphenhydrAMINE (BENADRYL) 25 MG tablet Take 25 mg by mouth every 6 (six) hours as needed for itching or allergies.   Yes [provider]  donepezil (ARICEPT) 10 MG tablet TAKE ONE TABLET BY MOUTH DAILY TO PRESERVE MEMORY 05/19/17  Yes Kimber RelicGreen, Arthur G, MD  vitamin B-12 (CYANOCOBALAMIN) 1000 MCG tablet Take 1,000 mcg by mouth daily.   Yes [provider]  calcium carbonate (OS-CAL) 600 MG TABS tablet Take 1 tablet (600 mg total) by mouth 2 (two) times daily with a meal. Patient not taking: Reported on 10/09/2017 09/07/17   Oneal GroutPandey, Mahima, MD    Physical Exam: Vitals:   10/09/17 1512 10/09/17 1527 10/09/17 1701  BP: (!) 142/88  (!) 97/54  Pulse: (!) 112  74  Resp:   17  Temp:  (!) 102.1 F (38.9 C)   TempSrc:  Rectal   SpO2: 94%  97%    Constitutional: NAD, calm, comfortable Vitals:   10/09/17 1512 10/09/17 1527 10/09/17 1701  BP: (!) 142/88  (!) 97/54  Pulse: (!) 112  74  Resp:   17  Temp:  (!) 102.1 F (38.9 C)   TempSrc:  Rectal   SpO2: 94%  97%   Eyes: PERRL, lids and conjunctivae normal ENMT: Mucous membranes are moist. Posterior pharynx clear of any exudate or lesions.Normal dentition.  Neck: normal, supple, no masses, no thyromegaly Respiratory: clear to auscultation bilaterally, no wheezing, no crackles. Normal respiratory effort. No accessory muscle use.  Cardiovascular: Regular rate and rhythm, no murmurs / rubs / gallops. No extremity edema. 2+ pedal pulses. No carotid bruits.  Abdomen: no tenderness,  no masses palpated. No hepatosplenomegaly. Bowel sounds positive.  Musculoskeletal: no clubbing / cyanosis. No joint deformity upper and lower extremities. Good ROM, no contractures. Normal muscle tone.  Skin: no rashes, lesions, ulcers. No induration Neurologic: CN 2-12 grossly intact. . Strength 5/5 in all 4.  Psychiatric:alert and confused.     Labs on Admission: I have personally reviewed following labs and imaging studies  CBC:  Recent Labs Lab 10/09/17 1555  WBC 12.5*  NEUTROABS 12.0*  HGB 13.0  HCT 39.0  MCV 96.8  PLT 202   Basic Metabolic Panel:  Recent Labs Lab 10/09/17 1555  NA 137  K 4.9  CL 104  CO2 20*  GLUCOSE 181*  BUN 22*  CREATININE 1.69*  CALCIUM 8.7*   GFR: CrCl cannot be calculated (Unknown ideal weight.). Liver Function Tests:  Recent Labs Lab 10/09/17 1555  AST 45*  ALT 26  ALKPHOS 56  BILITOT 2.0*  PROT 6.8  ALBUMIN 3.5    Recent Labs Lab 10/09/17 1555  LIPASE 19   No results for input(s): AMMONIA in the last 168 hours. Coagulation Profile: No results for input(s): INR, PROTIME in the last 168 hours. Cardiac Enzymes: No results for input(s): CKTOTAL, CKMB, CKMBINDEX, TROPONINI in the last 168 hours. BNP (last 3 results) No results for input(s): PROBNP in the last 8760 hours. HbA1C: No results for input(s): HGBA1C in the last 72 hours. CBG: No results for input(s): GLUCAP in the last 168 hours. Lipid Profile: No results for input(s): CHOL, HDL, LDLCALC, TRIG, CHOLHDL, LDLDIRECT in the last 72 hours. Thyroid Function Tests: No results for input(s): TSH, T4TOTAL, FREET4, T3FREE, THYROIDAB in the last 72 hours. Anemia Panel: No results for input(s): VITAMINB12, FOLATE, FERRITIN, TIBC, IRON, RETICCTPCT in the last 72 hours. Urine analysis:    Component Value Date/Time   COLORURINE YELLOW 10/09/2017 1555   APPEARANCEUR HAZY (A) 10/09/2017 1555   LABSPEC 1.018 10/09/2017 1555   PHURINE 7.0 10/09/2017 1555   GLUCOSEU  NEGATIVE 10/09/2017 1555   HGBUR MODERATE (A) 10/09/2017 1555   BILIRUBINUR NEGATIVE 10/09/2017 1555   KETONESUR 20 (A) 10/09/2017 1555   PROTEINUR 100 (A) 10/09/2017 1555   NITRITE POSITIVE (A) 10/09/2017 1555   LEUKOCYTESUR LARGE (A) 10/09/2017 1555    Radiological Exams on Admission: Dg Chest 2 View  Result Date: 10/09/2017 CLINICAL DATA:  Dementia and sepsis EXAM: CHEST  2 VIEW COMPARISON:  None. FINDINGS: The patient is rotated. There is no lobar consolidation or pulmonary edema. No pleural effusion or pneumothorax. There is multilevel thoracic and lumbar vertebral body height loss, including age-indeterminate compression fracture of the lower lumbar spine with associated grade 1 anterolisthesis. IMPRESSION: 1. No active cardiopulmonary disease. 2. Age indeterminate compression fracture of a lower lumbar vertebra with associated grade  1 anterolisthesis. There is also moderate to advanced vertebral body height loss at multiple levels of the thoracic spine. If there has been recent trauma or other reason to suspect an acute fracture of the vertebral column, consider MRI. Electronically Signed   By: Deatra Robinson M.D.   On: 10/09/2017 16:45    EKG: Independently reviewed. 112/min  Assessment/Plan Active Problems:   Sepsis (HCC)    Sepsis from urinary tract infection;  On arrival pt was febrile, tachycardic, leukocytosis, elevated lactic acid and abnormal UA, and acute renal failure. Trend lactic acid levels.  Improving with IV fluids, IV antibiotics with broad spectrum antibiotics.  Maintenance fluids with NS at 75 /ml.  Blood and urine cultures ordered and pending.     Acute renal failure:  Possibly PRE RENAL from dehydration vs ATN from UTI.  Hydrate and repeat renal parameters in am.  If creatinine is still high, get US RENAL for further evaluation.    Hypertension:  Sub optimal from fever.    Hyperlipidemia:     Dementia:  Resume home meds.    Leukocytosis:    From Sepsis.   Abnormal x ray showing possible lumbar vertebral fracture: Follow upwith MRI of the LS spine when confusion improves.    Acute metabolic encephalopathy: possibly from fever, AKI, and UTI.     DVT prophylaxis: lovenox Code Status: DNR Family Communication: daughter  Disposition Plan: possibly back to SNF  Consults called: none.  Admission status: inpatient tele.    Kathlen Mody MD [facility] Pager [phone]  If 7PM-7AM, please contact night-coverage [email or web address] Password [email or web address]  10/09/2017, 5:06 PM

## 2017-10-10 ENCOUNTER — Inpatient Hospital Stay (HOSPITAL_COMMUNITY): Payer: Medicare Other

## 2017-10-10 ENCOUNTER — Encounter (HOSPITAL_COMMUNITY): Payer: Self-pay

## 2017-10-10 DIAGNOSIS — N390 Urinary tract infection, site not specified: Secondary | ICD-10-CM

## 2017-10-10 DIAGNOSIS — R7989 Other specified abnormal findings of blood chemistry: Secondary | ICD-10-CM

## 2017-10-10 DIAGNOSIS — N179 Acute kidney failure, unspecified: Secondary | ICD-10-CM

## 2017-10-10 LAB — BASIC METABOLIC PANEL
Anion gap: 8 (ref 5–15)
BUN: 24 mg/dL — AB (ref 6–20)
CALCIUM: 8.1 mg/dL — AB (ref 8.9–10.3)
CO2: 23 mmol/L (ref 22–32)
Chloride: 110 mmol/L (ref 101–111)
Creatinine, Ser: 1.38 mg/dL — ABNORMAL HIGH (ref 0.44–1.00)
GFR calc Af Amer: 38 mL/min — ABNORMAL LOW (ref >60)
GFR, EST NON AFRICAN AMERICAN: 33 mL/min — AB (ref >60)
Glucose, Bld: 114 mg/dL — ABNORMAL HIGH (ref 65–99)
POTASSIUM: 3.4 mmol/L — AB (ref 3.5–5.1)
Sodium: 141 mmol/L (ref 135–145)

## 2017-10-10 LAB — BLOOD CULTURE ID PANEL (REFLEXED)
Acinetobacter baumannii: NOT DETECTED
CANDIDA GLABRATA: NOT DETECTED
CANDIDA PARAPSILOSIS: NOT DETECTED
CANDIDA TROPICALIS: NOT DETECTED
Candida albicans: NOT DETECTED
Candida krusei: NOT DETECTED
Carbapenem resistance: NOT DETECTED
ESCHERICHIA COLI: NOT DETECTED
Enterobacter cloacae complex: NOT DETECTED
Enterobacteriaceae species: DETECTED — AB
Enterococcus species: NOT DETECTED
Haemophilus influenzae: NOT DETECTED
Klebsiella oxytoca: NOT DETECTED
Klebsiella pneumoniae: NOT DETECTED
Listeria monocytogenes: NOT DETECTED
NEISSERIA MENINGITIDIS: NOT DETECTED
PROTEUS SPECIES: DETECTED — AB
Pseudomonas aeruginosa: NOT DETECTED
SERRATIA MARCESCENS: NOT DETECTED
STAPHYLOCOCCUS SPECIES: NOT DETECTED
STREPTOCOCCUS AGALACTIAE: NOT DETECTED
Staphylococcus aureus (BCID): NOT DETECTED
Streptococcus pneumoniae: NOT DETECTED
Streptococcus pyogenes: NOT DETECTED
Streptococcus species: NOT DETECTED

## 2017-10-10 LAB — CBC
HCT: 34.8 % — ABNORMAL LOW (ref 36.0–46.0)
Hemoglobin: 11.8 g/dL — ABNORMAL LOW (ref 12.0–15.0)
MCH: 33.1 pg (ref 26.0–34.0)
MCHC: 33.9 g/dL (ref 30.0–36.0)
MCV: 97.5 fL (ref 78.0–100.0)
PLATELETS: 155 10*3/uL (ref 150–400)
RBC: 3.57 MIL/uL — ABNORMAL LOW (ref 3.87–5.11)
RDW: 13.6 % (ref 11.5–15.5)
WBC: 18.1 10*3/uL — ABNORMAL HIGH (ref 4.0–10.5)

## 2017-10-10 MED ORDER — ACETAMINOPHEN 325 MG PO TABS
650.0000 mg | ORAL_TABLET | Freq: Four times a day (QID) | ORAL | Status: DC | PRN
  Administered 2017-10-10 – 2017-10-11 (×2): 650 mg via ORAL
  Filled 2017-10-10 (×2): qty 2

## 2017-10-10 MED ORDER — DEXTROSE 5 % IV SOLN
2.0000 g | INTRAVENOUS | Status: DC
  Administered 2017-10-10 – 2017-10-11 (×2): 2 g via INTRAVENOUS
  Filled 2017-10-10 (×3): qty 2

## 2017-10-10 MED ORDER — POTASSIUM CHLORIDE CRYS ER 20 MEQ PO TBCR
40.0000 meq | EXTENDED_RELEASE_TABLET | Freq: Once | ORAL | Status: AC
  Administered 2017-10-10: 40 meq via ORAL
  Filled 2017-10-10: qty 2

## 2017-10-10 MED ORDER — SODIUM CHLORIDE 0.9 % IV BOLUS (SEPSIS)
500.0000 mL | Freq: Once | INTRAVENOUS | Status: AC
  Administered 2017-10-10: 500 mL via INTRAVENOUS

## 2017-10-10 NOTE — Care Management Note (Signed)
Case Management Note  Patient Details  Name: Janora Norlandernna Marie Rumore MRN: [id number] Date of Birth: 03/30/1926  Subjective/Objective: 83 y/o f admitted w/Sepsis. From [facility]. PT cons-await recc.                   Action/Plan:d/c plan home.   Expected Discharge Date:                  Expected Discharge Plan:  Home/Self Care  In-House Referral:  Clinical Social Work  Discharge planning Services  CM Consult  Post Acute Care Choice:    Choice offered to:     DME Arranged:    DME Agency:     HH Arranged:    HH Agency:     Status of Service:  In process, will continue to follow  If discussed at Long Length of Stay Meetings, dates discussed:    Additional Comments:  Lanier ClamMahabir, Danitza Schoenfeldt, RN 10/10/2017, 12:16 PM

## 2017-10-10 NOTE — Progress Notes (Signed)
LCSW made aware [facility]   Call placed to [facility] and updated on patient status and inpatient admission. PT has been consulted. Anticipate patient returning to ILF unless otherwise stated or need for higher level of care.  No CSW interventions needed at this time, however will follow acutely in the event level of care changes and SNF is recommended at discharge.  CM has been updated on patient's level of care.  Deretha EmoryHannah Nijel Flink LCSW, MSW Clinical Social Work: Optician, dispensingystem [employer] Coverage for :  [phone]

## 2017-10-10 NOTE — Progress Notes (Signed)
PROGRESS NOTE    Ana [name]  ZOX:[id number] DOB: May 25, 1926 DOA: 10/09/2017 PCP: Oneal Grout, MD    Brief Narrative: Ana [name] is a 83 y.o. female with medical history significant of hypertension, dementia hyperlipidemia, was brought in for fever, confusion she was found to have gross UTI, and in acute renal failure.   Assessment & Plan:   Active Problems:   Hyperlipidemia   Hypertension   Memory loss   Stress incontinence   Sepsis (HCC)   Lumbar vertebral fracture (HCC)   AKI (acute kidney injury) (HCC)   Acute lower UTI   Elevated lactic acid level   Sepsis from urinary tract infection / proteus bacteremia:  Admitted for IV antibiotics, changed to IV rocephin and await sensitivities.  Fever gone.  Persistent leukocytosis.  Lactic acid normalized.     Acute renal failure:  Probably from dehydration and UTI.  Improving with fluids.    Hypokalemia:  Replaced.    Questionable lumbar vertebral  Fracture x rays ordered as she could not lay flat for the MRI.   Dementia:  No agitation or behavioral abnormalities.      DVT prophylaxis: (lovenox.  Code Status: full code.  Family Communication: none at bedside.  Disposition Plan: pending PT evaluation.   Consultants:   None.   Procedures: none.   Antimicrobials: rocephin.    Subjective: No new complaints.   Objective: Vitals:   10/09/17 1701 10/09/17 1834 10/09/17 2110 10/10/17 0508  BP: (!) 97/54 (!) 99/46 (!) 112/57 (!) 144/88  Pulse: 74 88 87 (!) 109  Resp: 17 17 16 18   Temp:  98.3 F (36.8 C) 98.1 F (36.7 C) 98.7 F (37.1 C)  TempSrc:  Oral Oral Oral  SpO2: 97% 96% 98% 96%  Weight:  61.1 kg (134 lb 11.2 oz) 62.2 kg (137 lb 2 oz)   Height:  5\' 3"  (1.6 m) 5\' 3"  (1.6 m)     Intake/Output Summary (Last 24 hours) at 10/10/17 1501 Last data filed at 10/10/17 0600  Gross per 24 hour  Intake            932.5 ml  Output              280 ml  Net            652.5 ml    Filed Weights   10/09/17 1834 10/09/17 2110  Weight: 61.1 kg (134 lb 11.2 oz) 62.2 kg (137 lb 2 oz)    Examination:  General exam: Appears calm and comfortable  Respiratory system: Clear to auscultation. Respiratory effort normal. Cardiovascular system: S1 & S2 heard, RRR. No JVD, murmurs, rubs, gallops or clicks. No pedal edema. Gastrointestinal system: Abdomen is nondistended, soft and nontender. No organomegaly or masses felt. Normal bowel sounds heard. Central nervous system: Alert and oriented. No focal neurological deficits. Extremities: Symmetric 5 x 5 power. Skin: No rashes, lesions or ulcers Psychiatry: Judgement and insight appear normal. Mood & affect appropriate.     Data Reviewed: I have personally reviewed following labs and imaging studies  CBC:  Recent Labs Lab 10/09/17 1555 10/10/17 0528  WBC 12.5* 18.1*  NEUTROABS 12.0*  --   HGB 13.0 11.8*  HCT 39.0 34.8*  MCV 96.8 97.5  PLT 202 155   Basic Metabolic Panel:  Recent Labs Lab 10/09/17 1555 10/10/17 0528  NA 137 141  K 4.9 3.4*  CL 104 110  CO2 20* 23  GLUCOSE 181* 114*  BUN 22* 24*  CREATININE  1.69* 1.38*  CALCIUM 8.7* 8.1*   GFR: Estimated Creatinine Clearance: 22.9 mL/min (A) (by C-G formula based on SCr of 1.38 mg/dL (H)). Liver Function Tests:  Recent Labs Lab 10/09/17 1555  AST 45*  ALT 26  ALKPHOS 56  BILITOT 2.0*  PROT 6.8  ALBUMIN 3.5    Recent Labs Lab 10/09/17 1555  LIPASE 19   No results for input(s): AMMONIA in the last 168 hours. Coagulation Profile: No results for input(s): INR, PROTIME in the last 168 hours. Cardiac Enzymes: No results for input(s): CKTOTAL, CKMB, CKMBINDEX, TROPONINI in the last 168 hours. BNP (last 3 results) No results for input(s): PROBNP in the last 8760 hours. HbA1C: No results for input(s): HGBA1C in the last 72 hours. CBG: No results for input(s): GLUCAP in the last 168 hours. Lipid Profile: No results for input(s): CHOL, HDL,  LDLCALC, TRIG, CHOLHDL, LDLDIRECT in the last 72 hours. Thyroid Function Tests: No results for input(s): TSH, T4TOTAL, FREET4, T3FREE, THYROIDAB in the last 72 hours. Anemia Panel: No results for input(s): VITAMINB12, FOLATE, FERRITIN, TIBC, IRON, RETICCTPCT in the last 72 hours. Sepsis Labs:  Recent Labs Lab 10/09/17 1551 10/09/17 2005  LATICACIDVEN 3.78* 1.4    Recent Results (from the past 240 hour(s))  Blood Culture (routine x 2)     Status: None (Preliminary result)   Collection Time: 10/09/17  3:55 PM  Result Value Ref Range Status   Specimen Description BLOOD LEFT WRIST  Final   Special Requests   Final    BOTTLES DRAWN AEROBIC AND ANAEROBIC Blood Culture adequate volume   Culture  Setup Time   Final    GRAM NEGATIVE RODS IN BOTH AEROBIC AND ANAEROBIC BOTTLES CRITICAL VALUE NOTED.  VALUE IS CONSISTENT WITH PREVIOUSLY REPORTED AND CALLED VALUE. Performed at [facility], [address] [place], [place] [address]    Culture GRAM NEGATIVE RODS  Final   Report Status PENDING  Incomplete  Blood Culture (routine x 2)     Status: None (Preliminary result)   Collection Time: 10/09/17  3:55 PM  Result Value Ref Range Status   Specimen Description BLOOD BLOOD LEFT FOREARM  Final   Special Requests   Final    BOTTLES DRAWN AEROBIC AND ANAEROBIC Blood Culture adequate volume   Culture  Setup Time   Final    GRAM NEGATIVE RODS IN BOTH AEROBIC AND ANAEROBIC BOTTLES CRITICAL RESULT CALLED TO, READ BACK BY AND VERIFIED WITH: Minna AntisHARMD M RENZ [phone] MLM Performed at [facility], [address] [place], [place] [address]    Culture GRAM NEGATIVE RODS  Final   Report Status PENDING  Incomplete  Blood Culture ID Panel (Reflexed)     Status: Abnormal   Collection Time: 10/09/17  3:55 PM  Result Value Ref Range Status   Enterococcus species NOT DETECTED NOT DETECTED Final   Listeria monocytogenes NOT DETECTED NOT DETECTED Final   Staphylococcus species NOT DETECTED  NOT DETECTED Final   Staphylococcus aureus NOT DETECTED NOT DETECTED Final   Streptococcus species NOT DETECTED NOT DETECTED Final   Streptococcus agalactiae NOT DETECTED NOT DETECTED Final   Streptococcus pneumoniae NOT DETECTED NOT DETECTED Final   Streptococcus pyogenes NOT DETECTED NOT DETECTED Final   Acinetobacter baumannii NOT DETECTED NOT DETECTED Final   Enterobacteriaceae species DETECTED (A) NOT DETECTED Final    Comment: Enterobacteriaceae represent a large family of gram-negative bacteria, not a single organism. CRITICAL RESULT CALLED TO, READ BACK BY AND VERIFIED WITH:  PHARMD M RENZ [phone] MLM    Enterobacter cloacae complex NOT DETECTED NOT DETECTED Final   Escherichia coli NOT DETECTED NOT DETECTED Final   Klebsiella oxytoca NOT DETECTED NOT DETECTED Final   Klebsiella pneumoniae NOT DETECTED NOT DETECTED Final   Proteus species DETECTED (A) NOT DETECTED Final    Comment: CRITICAL RESULT CALLED TO, READ BACK BY AND VERIFIED WITH: PHARMD M RENZ [phone] MLM    Serratia marcescens NOT DETECTED NOT DETECTED Final   Carbapenem resistance NOT DETECTED NOT DETECTED Final   Haemophilus influenzae NOT DETECTED NOT DETECTED Final   Neisseria meningitidis NOT DETECTED NOT DETECTED Final   Pseudomonas aeruginosa NOT DETECTED NOT DETECTED Final   Candida albicans NOT DETECTED NOT DETECTED Final   Candida glabrata NOT DETECTED NOT DETECTED Final   Candida krusei NOT DETECTED NOT DETECTED Final   Candida parapsilosis NOT DETECTED NOT DETECTED Final   Candida tropicalis NOT DETECTED NOT DETECTED Final    Comment: Performed at [facility], [address], [place], [place] [address]         Radiology Studies: Dg Chest 2 View  Result Date: 10/09/2017 CLINICAL DATA:  Dementia and sepsis EXAM: CHEST  2 VIEW COMPARISON:  None. FINDINGS: The patient is rotated. There is no lobar consolidation or pulmonary edema. No pleural effusion or pneumothorax. There is  multilevel thoracic and lumbar vertebral body height loss, including age-indeterminate compression fracture of the lower lumbar spine with associated grade 1 anterolisthesis. IMPRESSION: 1. No active cardiopulmonary disease. 2. Age indeterminate compression fracture of a lower lumbar vertebra with associated grade 1 anterolisthesis. There is also moderate to advanced vertebral body height loss at multiple levels of the thoracic spine. If there has been recent trauma or other reason to suspect an acute fracture of the vertebral column, consider MRI. Electronically Signed   By: Deatra Robinson M.D.   On: 10/09/2017 16:45        Scheduled Meds: . aspirin  325 mg Oral Daily  . donepezil  10 mg Oral QHS  . enoxaparin (LOVENOX) injection  30 mg Subcutaneous Q24H  . vitamin B-12  1,000 mcg Oral Daily   Continuous Infusions: . sodium chloride 75 mL/hr at 10/09/17 1854  . cefTRIAXone (ROCEPHIN)  IV Stopped (10/10/17 1451)     LOS: 1 day    Time spent: 35 minutes.     Kathlen Mody, MD [facility] Pager [phone]   If 7PM-7AM, please contact night-coverage [email or web address] Password [email or web address] 10/10/2017, 3:01 PM

## 2017-10-11 DIAGNOSIS — I10 Essential (primary) hypertension: Secondary | ICD-10-CM

## 2017-10-11 DIAGNOSIS — E7849 Other hyperlipidemia: Secondary | ICD-10-CM

## 2017-10-11 LAB — CREATININE, SERUM
Creatinine, Ser: 1.31 mg/dL — ABNORMAL HIGH (ref 0.44–1.00)
GFR, EST AFRICAN AMERICAN: 41 mL/min — AB (ref >60)
GFR, EST NON AFRICAN AMERICAN: 35 mL/min — AB (ref >60)

## 2017-10-11 LAB — CBC WITH DIFFERENTIAL/PLATELET
BASOS PCT: 0 %
Basophils Absolute: 0 10*3/uL (ref 0.0–0.1)
EOS ABS: 0.1 10*3/uL (ref 0.0–0.7)
Eosinophils Relative: 1 %
HCT: 37.8 % (ref 36.0–46.0)
Hemoglobin: 12.3 g/dL (ref 12.0–15.0)
Lymphocytes Relative: 7 %
Lymphs Abs: 0.9 10*3/uL (ref 0.7–4.0)
MCH: 31.5 pg (ref 26.0–34.0)
MCHC: 32.5 g/dL (ref 30.0–36.0)
MCV: 96.7 fL (ref 78.0–100.0)
MONO ABS: 0.8 10*3/uL (ref 0.1–1.0)
Monocytes Relative: 6 %
Neutro Abs: 11.2 10*3/uL — ABNORMAL HIGH (ref 1.7–7.7)
Neutrophils Relative %: 86 %
PLATELETS: 151 10*3/uL (ref 150–400)
RBC: 3.91 MIL/uL (ref 3.87–5.11)
RDW: 13.7 % (ref 11.5–15.5)
WBC: 13.1 10*3/uL — ABNORMAL HIGH (ref 4.0–10.5)

## 2017-10-11 LAB — URINE CULTURE

## 2017-10-11 LAB — POTASSIUM: Potassium: 3.7 mmol/L (ref 3.5–5.1)

## 2017-10-11 MED ORDER — IBUPROFEN 200 MG PO TABS
200.0000 mg | ORAL_TABLET | Freq: Once | ORAL | Status: AC
  Administered 2017-10-11: 200 mg via ORAL
  Filled 2017-10-11: qty 1

## 2017-10-11 NOTE — Evaluation (Signed)
Physical Therapy Evaluation Patient Details Name: Ana [name] MRN: [id number] DOB: 02/13/1926 Today's Date: 10/11/2017   History of Present Illness  83 y.o. female with medical history significant of hypertension, dementia hyperlipidemia, was brought in for fever, confusion. Dx UTI, sepsis  Clinical Impression  Pt admitted with above diagnosis. Pt currently with functional limitations due to the deficits listed below (see PT Problem List). Pt ambulated 120' with RW, no loss of balance. Pt appears to be near baseline of independence with a walker.  Pt will benefit from skilled PT to increase their independence and safety with mobility to allow discharge to the venue listed below.       Follow Up Recommendations No PT follow up    Equipment Recommendations  None recommended by PT    Recommendations for Other Services       Precautions / Restrictions Precautions Precautions: Fall Precaution Comments: pt denies h/o falls in past 1 year Restrictions Weight Bearing Restrictions: No      Mobility  Bed Mobility Overal bed mobility: Modified Independent             General bed mobility comments: with bedrail, HOB up 30*  Transfers Overall transfer level: Needs assistance Equipment used: Rolling walker (2 wheeled) Transfers: Sit to/from Stand Sit to Stand: Supervision         General transfer comment: VCs hand placement  Ambulation/Gait Ambulation/Gait assistance: Supervision Ambulation Distance (Feet): 120 Feet Assistive device: Rolling walker (2 wheeled) Gait Pattern/deviations: WFL(Within Functional Limits)   Gait velocity interpretation: at or above normal speed for age/gender General Gait Details: steady, no LOB  Stairs            Wheelchair Mobility    Modified Rankin (Stroke Patients Only)       Balance Overall balance assessment: Modified Independent                                           Pertinent Vitals/Pain  Pain Assessment: No/denies pain    Home Living Family/patient expects to be discharged to:: Assisted living               Home Equipment: Walker - 2 wheels;Walker - 4 wheels Additional Comments: [facility] with spouse    Prior Function Level of Independence: Independent with assistive device(s)         Comments: walks to dining room with rollater; sponge bathes independently     Hand Dominance        Extremity/Trunk Assessment   Upper Extremity Assessment Upper Extremity Assessment: Overall WFL for tasks assessed    Lower Extremity Assessment Lower Extremity Assessment: Overall WFL for tasks assessed    Cervical / Trunk Assessment Cervical / Trunk Assessment: Normal  Communication   Communication: No difficulties  Cognition Arousal/Alertness: Awake/alert Behavior During Therapy: WFL for tasks assessed/performed Overall Cognitive Status: No family/caregiver present to determine baseline cognitive functioning                                 General Comments: alert and oriented x 4 but makes odd comments at times ("I heard you're obsessed with bottoms".)      General Comments      Exercises     Assessment/Plan    PT Assessment Patient needs continued PT services  PT Problem List Decreased activity  tolerance;Decreased mobility       PT Treatment Interventions DME instruction;Gait training;Patient/family education    PT Goals (Current goals can be found in the Care Plan section)  Acute Rehab PT Goals Patient Stated Goal: to go home ASAP PT Goal Formulation: With patient Time For Goal Achievement: 10/25/17 Potential to Achieve Goals: Good    Frequency Min 3X/week   Barriers to discharge        Co-evaluation               AM-PAC PT "6 Clicks" Daily Activity  Outcome Measure Difficulty turning over in bed (including adjusting bedclothes, sheets and blankets)?: None Difficulty moving from lying on back to sitting on the  side of the bed? : A Little Difficulty sitting down on and standing up from a chair with arms (e.g., wheelchair, bedside commode, etc,.)?: A Little Help needed moving to and from a bed to chair (including a wheelchair)?: A Little Help needed walking in hospital room?: A Little Help needed climbing 3-5 steps with a railing? : A Little 6 Click Score: 19    End of Session Equipment Utilized During Treatment: Gait belt Activity Tolerance: Patient tolerated treatment well Patient left: in chair;with call bell/phone within reach;with chair alarm set Nurse Communication: Mobility status PT Visit Diagnosis: Difficulty in walking, not elsewhere classified (R26.2)    Time: 1010-1030 PT Time Calculation (min) (ACUTE ONLY): 20 min   Charges:   PT Evaluation $PT Eval Low Complexity: 1 Low     PT G CodesTamala Ser 10/11/2017, 10:38 AM [phone]

## 2017-10-11 NOTE — Progress Notes (Signed)
Resumed care of this patient from previous RN. Agree with prior assessment. Will continue to monitor patient closely.  

## 2017-10-11 NOTE — Care Management Note (Signed)
Case Management Note  Patient Details  Name: Janora Norlandernna Marie Hargens MRN: [id number] Date of Birth: 04/10/1926  Subjective/Objective: PT-recc No PT f/u. Patient from [facility]. No further CM needs.                   Action/Plan:d/c home.   Expected Discharge Date:                  Expected Discharge Plan:  Home/Self Care  In-House Referral:  Clinical Social Work  Discharge planning Services  CM Consult  Post Acute Care Choice:    Choice offered to:     DME Arranged:    DME Agency:     HH Arranged:    HH Agency:     Status of Service:  Completed, signed off  If discussed at [employer] Meetings, dates discussed:    Additional Comments:  Lanier ClamMahabir, Mee Macdonnell, RN 10/11/2017, 11:15 AM

## 2017-10-11 NOTE — Progress Notes (Signed)
PROGRESS NOTE    Ana [name]  NFA:[id number] DOB: 04/27/1926 DOA: 10/09/2017 PCP: Oneal GroutPandey, Mahima, MD    Brief Narrative: Ana [name] is a 83 y.o. female with medical history significant of hypertension, dementia hyperlipidemia, was brought in for fever, confusion she was found to have gross UTI, and in acute renal failure.   Assessment & Plan:   Active Problems:   Hyperlipidemia   Hypertension   Memory loss   Stress incontinence   Sepsis (HCC)   Lumbar vertebral fracture (HCC)   AKI (acute kidney injury) (HCC)   Acute lower UTI   Elevated lactic acid level   Sepsis from urinary tract infection / proteus bacteremia:  Admitted for IV antibiotics, changed to IV rocephin. sentivities are reviewed, can be discharged on amoxicillin once she remains afebrile. Continues to have high grade fevers ,but leukocytosis is improving.  Lactic acid normalized.     Acute renal failure:  Probably from dehydration and UTI.  Improving with fluids.    Hypokalemia:  Replaced.    Questionable lumbar vertebral  Fracture x rays ordered as she could not lay flat for the MRI. X rays do not show acute fracture of the lumbar vertebra.   Dementia:  No agitation or behavioral abnormalities.      DVT prophylaxis: (lovenox.  Code Status: full code.  Family Communication: none at bedside.  Disposition Plan: pending PT evaluation.   Consultants:   None.   Procedures: none.   Antimicrobials: rocephin.    Subjective: No new complaints.   Objective: Vitals:   10/11/17 0427 10/11/17 1300 10/11/17 1305 10/11/17 1417  BP: (!) 154/71 (!) 174/97 (!) 170/82 (!) 147/80  Pulse: 67 94 97 74  Resp: 18 16    Temp: 97.6 F (36.4 C) (!) 100.4 F (38 C) (!) 100.5 F (38.1 C) (!) 101.2 F (38.4 C)  TempSrc: Oral Oral Oral Oral  SpO2: 97%     Weight:      Height:        Intake/Output Summary (Last 24 hours) at 10/11/17 1443 Last data filed at 10/11/17 1400  Gross per 24  hour  Intake           3062.5 ml  Output                0 ml  Net           3062.5 ml   Filed Weights   10/09/17 1834 10/09/17 2110  Weight: 61.1 kg (134 lb 11.2 oz) 62.2 kg (137 lb 2 oz)    Examination:  General exam: Appears calm and comfortable , not in distress.  Respiratory system: Clear to auscultation. Respiratory effort normal. No wheezing or rhonchi.  Cardiovascular system: S1 & S2 heard, RRR. No JVD,  No pedal edema. Gastrointestinal system: Abdomen is soft non tender non distended bowel sounds heard.  Central nervous system: Alert and oriented. No focal neurological deficits. Extremities: Symmetric 5 x 5 power. No cyanosis or clubbing.  Skin: No rashes, lesions or ulcers Psychiatry: Judgement and insight appear normal. Mood & affect appropriate.     Data Reviewed: I have personally reviewed following labs and imaging studies  CBC:  Recent Labs Lab 10/09/17 1555 10/10/17 0528 10/11/17 1209  WBC 12.5* 18.1* 13.1*  NEUTROABS 12.0*  --  11.2*  HGB 13.0 11.8* 12.3  HCT 39.0 34.8* 37.8  MCV 96.8 97.5 96.7  PLT 202 155 151   Basic Metabolic Panel:  Recent Labs Lab 10/09/17 1555 10/10/17  1610 10/11/17 0518  NA 137 141  --   K 4.9 3.4* 3.7  CL 104 110  --   CO2 20* 23  --   GLUCOSE 181* 114*  --   BUN 22* 24*  --   CREATININE 1.69* 1.38* 1.31*  CALCIUM 8.7* 8.1*  --    GFR: Estimated Creatinine Clearance: 24.1 mL/min (A) (by C-G formula based on SCr of 1.31 mg/dL (H)). Liver Function Tests:  Recent Labs Lab 10/09/17 1555  AST 45*  ALT 26  ALKPHOS 56  BILITOT 2.0*  PROT 6.8  ALBUMIN 3.5    Recent Labs Lab 10/09/17 1555  LIPASE 19   No results for input(s): AMMONIA in the last 168 hours. Coagulation Profile: No results for input(s): INR, PROTIME in the last 168 hours. Cardiac Enzymes: No results for input(s): CKTOTAL, CKMB, CKMBINDEX, TROPONINI in the last 168 hours. BNP (last 3 results) No results for input(s): PROBNP in the last 8760  hours. HbA1C: No results for input(s): HGBA1C in the last 72 hours. CBG: No results for input(s): GLUCAP in the last 168 hours. Lipid Profile: No results for input(s): CHOL, HDL, LDLCALC, TRIG, CHOLHDL, LDLDIRECT in the last 72 hours. Thyroid Function Tests: No results for input(s): TSH, T4TOTAL, FREET4, T3FREE, THYROIDAB in the last 72 hours. Anemia Panel: No results for input(s): VITAMINB12, FOLATE, FERRITIN, TIBC, IRON, RETICCTPCT in the last 72 hours. Sepsis Labs:  Recent Labs Lab 10/09/17 1551 10/09/17 2005  LATICACIDVEN 3.78* 1.4    Recent Results (from the past 240 hour(s))  Blood Culture (routine x 2)     Status: Abnormal (Preliminary result)   Collection Time: 10/09/17  3:55 PM  Result Value Ref Range Status   Specimen Description BLOOD LEFT WRIST  Final   Special Requests   Final    BOTTLES DRAWN AEROBIC AND ANAEROBIC Blood Culture adequate volume   Culture  Setup Time   Final    GRAM NEGATIVE RODS IN BOTH AEROBIC AND ANAEROBIC BOTTLES CRITICAL VALUE NOTED.  VALUE IS CONSISTENT WITH PREVIOUSLY REPORTED AND CALLED VALUE.    Culture (A)  Final    PROTEUS MIRABILIS SUSCEPTIBILITIES TO FOLLOW Performed at [facility], [address] [place], [place] [address]    Report Status PENDING  Incomplete  Blood Culture (routine x 2)     Status: Abnormal (Preliminary result)   Collection Time: 10/09/17  3:55 PM  Result Value Ref Range Status   Specimen Description BLOOD BLOOD LEFT FOREARM  Final   Special Requests   Final    BOTTLES DRAWN AEROBIC AND ANAEROBIC Blood Culture adequate volume   Culture  Setup Time   Final    GRAM NEGATIVE RODS IN BOTH AEROBIC AND ANAEROBIC BOTTLES CRITICAL RESULT CALLED TO, READ BACK BY AND VERIFIED WITH: PHARMD M RENZ [id number] MLM    Culture (A)  Final    PROTEUS MIRABILIS SUSCEPTIBILITIES TO FOLLOW Performed at [facility], [address], [place], [place] [address]    Report Status PENDING  Incomplete  Urine  culture     Status: Abnormal   Collection Time: 10/09/17  3:55 PM  Result Value Ref Range Status   Specimen Description URINE, RANDOM  Final   Special Requests NONE  Final   Culture >=100,000 COLONIES/mL PROTEUS MIRABILIS (A)  Final   Report Status 10/11/2017 FINAL  Final   Organism ID, Bacteria PROTEUS MIRABILIS (A)  Final      Susceptibility   Proteus mirabilis - MIC*  AMPICILLIN <=2 SENSITIVE Sensitive     CEFAZOLIN <=4 SENSITIVE Sensitive     CEFTRIAXONE <=1 SENSITIVE Sensitive     CIPROFLOXACIN <=0.25 SENSITIVE Sensitive     GENTAMICIN <=1 SENSITIVE Sensitive     IMIPENEM 8 INTERMEDIATE Intermediate     NITROFURANTOIN 128 RESISTANT Resistant     TRIMETH/SULFA <=20 SENSITIVE Sensitive     AMPICILLIN/SULBACTAM <=2 SENSITIVE Sensitive     PIP/TAZO <=4 SENSITIVE Sensitive     * >=100,000 COLONIES/mL PROTEUS MIRABILIS  Blood Culture ID Panel (Reflexed)     Status: Abnormal   Collection Time: 10/09/17  3:55 PM  Result Value Ref Range Status   Enterococcus species NOT DETECTED NOT DETECTED Final   Listeria monocytogenes NOT DETECTED NOT DETECTED Final   Staphylococcus species NOT DETECTED NOT DETECTED Final   Staphylococcus aureus NOT DETECTED NOT DETECTED Final   Streptococcus species NOT DETECTED NOT DETECTED Final   Streptococcus agalactiae NOT DETECTED NOT DETECTED Final   Streptococcus pneumoniae NOT DETECTED NOT DETECTED Final   Streptococcus pyogenes NOT DETECTED NOT DETECTED Final   Acinetobacter baumannii NOT DETECTED NOT DETECTED Final   Enterobacteriaceae species DETECTED (A) NOT DETECTED Final    Comment: Enterobacteriaceae represent a large family of gram-negative bacteria, not a single organism. CRITICAL RESULT CALLED TO, READ BACK BY AND VERIFIED WITH: PHARMD M RENZ [id number] MLM    Enterobacter cloacae complex NOT DETECTED NOT DETECTED Final   Escherichia coli NOT DETECTED NOT DETECTED Final   Klebsiella oxytoca NOT DETECTED NOT DETECTED Final   Klebsiella  pneumoniae NOT DETECTED NOT DETECTED Final   Proteus species DETECTED (A) NOT DETECTED Final    Comment: CRITICAL RESULT CALLED TO, READ BACK BY AND VERIFIED WITH: PHARMD M RENZ [phone] MLM    Serratia marcescens NOT DETECTED NOT DETECTED Final   Carbapenem resistance NOT DETECTED NOT DETECTED Final   Haemophilus influenzae NOT DETECTED NOT DETECTED Final   Neisseria meningitidis NOT DETECTED NOT DETECTED Final   Pseudomonas aeruginosa NOT DETECTED NOT DETECTED Final   Candida albicans NOT DETECTED NOT DETECTED Final   Candida glabrata NOT DETECTED NOT DETECTED Final   Candida krusei NOT DETECTED NOT DETECTED Final   Candida parapsilosis NOT DETECTED NOT DETECTED Final   Candida tropicalis NOT DETECTED NOT DETECTED Final    Comment: Performed at [facility], [address] [place], [place] [address]         Radiology Studies: Dg Chest 2 View  Result Date: 10/09/2017 CLINICAL DATA:  Dementia and sepsis EXAM: CHEST  2 VIEW COMPARISON:  None. FINDINGS: The patient is rotated. There is no lobar consolidation or pulmonary edema. No pleural effusion or pneumothorax. There is multilevel thoracic and lumbar vertebral body height loss, including age-indeterminate compression fracture of the lower lumbar spine with associated grade 1 anterolisthesis. IMPRESSION: 1. No active cardiopulmonary disease. 2. Age indeterminate compression fracture of a lower lumbar vertebra with associated grade 1 anterolisthesis. There is also moderate to advanced vertebral body height loss at multiple levels of the thoracic spine. If there has been recent trauma or other reason to suspect an acute fracture of the vertebral column, consider MRI. Electronically Signed   By: Deatra Robinson M.D.   On: 10/09/2017 16:45   Dg Lumbar Spine Complete  Result Date: 10/10/2017 CLINICAL DATA:  83 y/o  F; lower back pain. EXAM: LUMBAR SPINE - COMPLETE 4+ VIEW COMPARISON:  10/10/2017 MRI of the lumbar spine. FINDINGS:  Partially visualized right proximal femur screw. Calcific density  projecting over right renal pelvis, likely large kidney stone. Calcific atherosclerosis of abdominal aorta. Mild dextrocurvature with apex at L1. Moderate L4 vertebral body compression deformity in grade 1 L4-5 anterolisthesis. Multilevel degenerative changes of the spine with marginal osteophytes and severe facet arthropathy. IMPRESSION: 1. Mild lumbar levocurvature.  Grade 1 L4-5 anterolisthesis. 2. Moderate L4 vertebral body compression deformity. Electronically Signed   By: Mitzi Hansen M.D.   On: 10/10/2017 19:32   Mr Lumbar Spine Wo Contrast  Result Date: 10/10/2017 CLINICAL DATA:  Abnormal x-ray of the lumbosacral spine. Degenerative joint disease. Falls. EXAM: MRI LUMBAR SPINE WITHOUT CONTRAST TECHNIQUE: Multiplanar, multisequence MR imaging of the lumbar spine was performed. No intravenous contrast was administered. COMPARISON:  None. FINDINGS: Segmentation: 5 non rib-bearing lumbar type vertebral bodies are present. Alignment: Degenerative grade 1 anterolisthesis is present at L3-4 and L4-5. Levoconvex curvature of the lumbar spine is centered at L2. Vertebrae: A remote superior endplate fracture is present at L4 without significant retropulsion of bone. Chronic endplate marrow changes are present on the right at T11-12 and T12-L1. Marrow signal is otherwise within normal limits. Conus medullaris: Extends to the T12-L1 level and appears normal. Paraspinal and other soft tissues: Right-sided hydronephrosis is present. A 13 mm stone is present at the right UPJ. Scattered small cysts are present in the left kidney. There is no significant adenopathy. Disc levels: T12-L1: Asymmetric right-sided facet hypertrophy and a broad-based disc protrusion contribute to severe right foraminal narrowing. The central canal and left foramen are patent. L1-2: Moderate facet hypertrophy is worse on the right. Moderate right foraminal narrowing  is present. The central canal and left foramen are patent. L2-3: Moderate facet hypertrophy is worse on the right. There is no significant disc protrusion or stenosis. L3-4: A broad-based disc protrusion is present. Moderate facet hypertrophy is noted bilaterally. This leads to mild subarticular narrowing, right greater than left. Mild foraminal narrowing is also present bilaterally. L4-5: There is uncovering of a broad-based disc protrusion. Advanced facet hypertrophy is noted. Mild subarticular and right foraminal narrowing is present. L5-S1: Mild facet hypertrophy is noted bilaterally. No significant disc protrusion or stenosis is present. There is desiccation of the disc. IMPRESSION: 1. Large right-sided kidney stone with moderate right-sided hydronephrosis. Noncontrast CT of the abdomen or ultrasound could be used for further evaluation if clinically indicated. 2. Remote superior endplate L4 fracture. 3. No acute fractures. 4. Scoliosis. 5. Severe right foraminal stenosis at T12-L1. 6. Moderate right foraminal stenosis at L1-2. 7. Mild subarticular and foraminal narrowing bilaterally at L3-4 is worse on the right. 8. Mild subarticular narrowing bilaterally and right foraminal stenosis at L4-5. 9. Desiccation the disc with mild bilateral facet hypertrophy at L5-S1 no significant focal stenosis. Electronically Signed   By: Marin Roberts M.D.   On: 10/10/2017 17:21        Scheduled Meds: . aspirin  325 mg Oral Daily  . donepezil  10 mg Oral QHS  . enoxaparin (LOVENOX) injection  30 mg Subcutaneous Q24H  . vitamin B-12  1,000 mcg Oral Daily   Continuous Infusions: . sodium chloride 75 mL/hr at 10/11/17 1349  . cefTRIAXone (ROCEPHIN)  IV Stopped (10/11/17 1349)     LOS: 2 days    Time spent: 35 minutes.     Kathlen Mody, MD [facility] Pager [phone]   If 7PM-7AM, please contact night-coverage [email or web address] Password [email or web address] 10/11/2017, 2:43 PM

## 2017-10-11 NOTE — Plan of Care (Signed)
Problem: Safety: Goal: Ability to remain free from injury will improve Outcome: Progressing Bed/chair alarms in use.   Problem: Physical Regulation: Goal: Will remain free from infection Outcome: Progressing Receiving IV abx.   Problem: Activity: Goal: Risk for activity intolerance will decrease Outcome: Progressing PT eval.

## 2017-10-12 LAB — BASIC METABOLIC PANEL
Anion gap: 9 (ref 5–15)
BUN: 14 mg/dL (ref 6–20)
CALCIUM: 8.4 mg/dL — AB (ref 8.9–10.3)
CO2: 24 mmol/L (ref 22–32)
CREATININE: 0.89 mg/dL (ref 0.44–1.00)
Chloride: 108 mmol/L (ref 101–111)
GFR calc non Af Amer: 56 mL/min — ABNORMAL LOW (ref >60)
Glucose, Bld: 119 mg/dL — ABNORMAL HIGH (ref 65–99)
Potassium: 3.2 mmol/L — ABNORMAL LOW (ref 3.5–5.1)
SODIUM: 141 mmol/L (ref 135–145)

## 2017-10-12 LAB — CBC
HEMATOCRIT: 35.3 % — AB (ref 36.0–46.0)
Hemoglobin: 11.7 g/dL — ABNORMAL LOW (ref 12.0–15.0)
MCH: 31.6 pg (ref 26.0–34.0)
MCHC: 33.1 g/dL (ref 30.0–36.0)
MCV: 95.4 fL (ref 78.0–100.0)
PLATELETS: 148 10*3/uL — AB (ref 150–400)
RBC: 3.7 MIL/uL — ABNORMAL LOW (ref 3.87–5.11)
RDW: 13.6 % (ref 11.5–15.5)
WBC: 10.1 10*3/uL (ref 4.0–10.5)

## 2017-10-12 LAB — CULTURE, BLOOD (ROUTINE X 2)
SPECIAL REQUESTS: ADEQUATE
Special Requests: ADEQUATE

## 2017-10-12 MED ORDER — POTASSIUM CHLORIDE CRYS ER 20 MEQ PO TBCR: 40.0000 meq | EXTENDED_RELEASE_TABLET | Freq: Two times a day (BID) | ORAL | Status: DC

## 2017-10-12 MED ORDER — AMOXICILLIN-POT CLAVULANATE 875-125 MG PO TABS: 1.0000 | ORAL_TABLET | Freq: Two times a day (BID) | ORAL | 0 refills | Status: AC

## 2017-10-12 NOTE — Care Management Note (Signed)
Case Management Note  Patient Details  Name: Ana [name] MRN: [id number] Date of Birth: 04/04/1926  Subjective/Objective:                    Action/Plan:d/c SNF-[facility]   Expected Discharge Date:  10/12/17               Expected Discharge Plan:  Skilled Nursing Facility  In-House Referral:  Clinical Social Work  Discharge planning Services  CM Consult  Post Acute Care Choice:    Choice offered to:     DME Arranged:    DME Agency:     HH Arranged:    HH Agency:     Status of Service:  Completed, signed off  If discussed at [employer] Meetings, dates discussed:    Additional Comments:  Lanier ClamMahabir, Camden Knotek, RN 10/12/2017, 11:56 AM

## 2017-10-12 NOTE — Care Management Important Message (Signed)
Important Message  Patient Details  Name: Ana [name] MRN: [id number] Date of Birth: 06/26/1926   Medicare Important Message Given:  Yes    Caren MacadamFuller, Bettymae Yott 10/12/2017, 10:42 AMImportant Message  Patient Details  Name: Ana [name] MRN: [id number] Date of Birth: 11/10/1926   Medicare Important Message Given:  Yes    Caren MacadamFuller, Daxson Reffett 10/12/2017, 10:42 AM

## 2017-10-12 NOTE — Progress Notes (Signed)
Physical Therapy Treatment Patient Details Name: Ana [name] MRN: [id number] DOB: 07/02/1926 Today's Date: 10/12/2017    History of Present Illness 83 y.o. female with medical history significant of hypertension, dementia hyperlipidemia, was brought in for fever, confusion. Dx UTI, sepsis    PT Comments    Ambulated 150 ft with rollator with min guard and supervision. VCs required for hand placement during sit to stand with bed elevated. Positioned in recliner. Reported no pain with exercise.   Follow Up Recommendations  No PT follow up     Equipment Recommendations  None recommended by PT    Recommendations for Other Services       Precautions / Restrictions Precautions Precautions: Fall Restrictions Weight Bearing Restrictions: No    Mobility  Bed Mobility                  Transfers Overall transfer level: Needs assistance Equipment used: Rolling walker (2 wheeled) Transfers: Sit to/from Stand Sit to Stand: Supervision;Min guard         General transfer comment: VCs for hand placement and safety  Ambulation/Gait Ambulation/Gait assistance: Supervision;Min guard Ambulation Distance (Feet): 150 Feet Assistive device: Rollator  (2 wheeled) Gait Pattern/deviations: WFL(Within Functional Limits)   Gait velocity interpretation: at or above normal speed for age/gender     Stairs            Wheelchair Mobility    Modified Rankin (Stroke Patients Only)       Balance                                            Cognition Arousal/Alertness: Awake/alert Behavior During Therapy: WFL for tasks assessed/performed Overall Cognitive Status: No family/caregiver present to determine baseline cognitive functioning                                        Exercises      General Comments        Pertinent Vitals/Pain Pain Assessment: No/denies pain    Home Living                      Prior  Function            PT Goals (current goals can now be found in the care plan section)      Frequency    Min 3X/week      PT Plan      Co-evaluation              AM-PAC PT "6 Clicks" Daily Activity  Outcome Measure  Difficulty turning over in bed (including adjusting bedclothes, sheets and blankets)?: None Difficulty moving from lying on back to sitting on the side of the bed? : A Little Difficulty sitting down on and standing up from a chair with arms (e.g., wheelchair, bedside commode, etc,.)?: A Little Help needed moving to and from a bed to chair (including a wheelchair)?: A Little Help needed walking in hospital room?: A Little Help needed climbing 3-5 steps with a railing? : A Little 6 Click Score: 19    End of Session Equipment Utilized During Treatment: Gait belt Activity Tolerance: Patient tolerated treatment well Patient left: in chair;with call bell/phone within reach;with chair alarm set Nurse Communication: Mobility status PT Visit  Diagnosis: Difficulty in walking, not elsewhere classified (R26.2)     Time: 1610-9604 PT Time Calculation (min) (ACUTE ONLY): 19 min  Charges:  $Gait Training: 8-22 mins                    G Codes:       Stephens November, SPTA [facility] [phone]  Felecia Shelling  PTA [facility]  Acute  Rehab Pager      [phone]

## 2017-10-12 NOTE — Discharge Summary (Addendum)
Physician Discharge Summary  Ana [name] WUJ:[id number] DOB: 1926-10-17 DOA: 10/09/2017  PCP: Oneal Grout, MD  Admit date: 10/09/2017 Discharge date: 10/12/2017  [facility] Recommendations for Outpatient Follow-up:  1. Follow up with PCP in 1-2 weeks 2. Please obtain BMP/CBC in one week Please follow the repeat blood cultures.   Discharge Condition:stable.  CODE STATUS: dnr  Diet recommendation: Heart Healthy  Brief/Interim Summary: Ana [name] a 83 y.o.femalewith medical history significant of hypertension, dementiahyperlipidemia, was brought in for fever, confusion she was found to have gross UTI, and in acute renal failure.  Discharge Diagnoses:  Active Problems:   Hyperlipidemia   Hypertension   Memory loss   Stress incontinence   Sepsis (HCC)   Lumbar vertebral fracture (HCC)   AKI (acute kidney injury) (HCC)   Acute lower UTI   Elevated lactic acid level  Sepsis from urinary tract infection / proteus bacteremia:  Admitted for IV antibiotics, changed to IV rocephin. sentivities are reviewed, . Plan to discharge pt on augmentin for 12 more days to complete the course. Low grade temp, but clinically she is much improved and leukocytosis has resolved.  Repeat blood cultures done and have been negative so far, recommend to follow the cultures.  Lactic acid normalized.     Acute renal failure:  Probably from dehydration and UTI.  Renal parameters back to normal.    Hypokalemia:  Replaced. Repeat K in one week.    Questionable lumbar vertebral  Fracture x rays ordered as she could not lay flat for the MRI. X rays do not show acute fracture of the lumbar vertebra.   Dementia:  No agitation or behavioral abnormalities.    Discharge Instructions  Discharge Instructions    Diet - low sodium heart healthy    Complete by:  As directed    Discharge instructions    Complete by:  As directed     Please follow up the blood culture report.  Please follow up with PCP in one week.     Allergies as of 10/12/2017      Reactions   Ace Inhibitors    Almond Oil    Plum Pulp       Medication List    TAKE these medications   amoxicillin-clavulanate 875-125 MG tablet Commonly known as:  AUGMENTIN Take 1 tablet by mouth every 12 (twelve) hours.   aspirin 325 MG EC tablet Take 325 mg by mouth daily.   calcium carbonate 600 MG Tabs tablet Commonly known as:  OS-CAL Take 1 tablet (600 mg total) by mouth 2 (two) times daily with a meal.   diphenhydrAMINE 25 MG tablet Commonly known as:  BENADRYL Take 25 mg by mouth every 6 (six) hours as needed for itching or allergies.   donepezil 10 MG tablet Commonly known as:  ARICEPT TAKE ONE TABLET BY MOUTH DAILY TO PRESERVE MEMORY   vitamin B-12 1000 MCG tablet Commonly known as:  CYANOCOBALAMIN Take 1,000 mcg by mouth daily.   Vitamin D3 1000 units Caps Take by mouth. Take one tablet daily      Follow-up Information    Oneal Grout, MD. Schedule an appointment as soon as possible for a visit in 1 week(s).   Specialty:  Internal Medicine Contact information: [address] [place] [address] [phone]          Allergies  Allergen Reactions  . Ace Inhibitors   . Almond Oil   . Plum Pulp  Consultations:  None.    Procedures/Studies: Dg Chest 2 View  Result Date: 10/09/2017 CLINICAL DATA:  Dementia and sepsis EXAM: CHEST  2 VIEW COMPARISON:  None. FINDINGS: The patient is rotated. There is no lobar consolidation or pulmonary edema. No pleural effusion or pneumothorax. There is multilevel thoracic and lumbar vertebral body height loss, including age-indeterminate compression fracture of the lower lumbar spine with associated grade 1 anterolisthesis. IMPRESSION: 1. No active cardiopulmonary disease. 2. Age indeterminate compression fracture of a lower lumbar vertebra with associated grade 1  anterolisthesis. There is also moderate to advanced vertebral body height loss at multiple levels of the thoracic spine. If there has been recent trauma or other reason to suspect an acute fracture of the vertebral column, consider MRI. Electronically Signed   By: Deatra Robinson M.D.   On: 10/09/2017 16:45   Dg Lumbar Spine Complete  Result Date: 10/10/2017 CLINICAL DATA:  83 y/o  F; lower back pain. EXAM: LUMBAR SPINE - COMPLETE 4+ VIEW COMPARISON:  10/10/2017 MRI of the lumbar spine. FINDINGS: Partially visualized right proximal femur screw. Calcific density projecting over right renal pelvis, likely large kidney stone. Calcific atherosclerosis of abdominal aorta. Mild dextrocurvature with apex at L1. Moderate L4 vertebral body compression deformity in grade 1 L4-5 anterolisthesis. Multilevel degenerative changes of the spine with marginal osteophytes and severe facet arthropathy. IMPRESSION: 1. Mild lumbar levocurvature.  Grade 1 L4-5 anterolisthesis. 2. Moderate L4 vertebral body compression deformity. Electronically Signed   By: Mitzi Hansen M.D.   On: 10/10/2017 19:32   Mr Lumbar Spine Wo Contrast  Result Date: 10/10/2017 CLINICAL DATA:  Abnormal x-ray of the lumbosacral spine. Degenerative joint disease. Falls. EXAM: MRI LUMBAR SPINE WITHOUT CONTRAST TECHNIQUE: Multiplanar, multisequence MR imaging of the lumbar spine was performed. No intravenous contrast was administered. COMPARISON:  None. FINDINGS: Segmentation: 5 non rib-bearing lumbar type vertebral bodies are present. Alignment: Degenerative grade 1 anterolisthesis is present at L3-4 and L4-5. Levoconvex curvature of the lumbar spine is centered at L2. Vertebrae: A remote superior endplate fracture is present at L4 without significant retropulsion of bone. Chronic endplate marrow changes are present on the right at T11-12 and T12-L1. Marrow signal is otherwise within normal limits. Conus medullaris: Extends to the T12-L1 level and  appears normal. Paraspinal and other soft tissues: Right-sided hydronephrosis is present. A 13 mm stone is present at the right UPJ. Scattered small cysts are present in the left kidney. There is no significant adenopathy. Disc levels: T12-L1: Asymmetric right-sided facet hypertrophy and a broad-based disc protrusion contribute to severe right foraminal narrowing. The central canal and left foramen are patent. L1-2: Moderate facet hypertrophy is worse on the right. Moderate right foraminal narrowing is present. The central canal and left foramen are patent. L2-3: Moderate facet hypertrophy is worse on the right. There is no significant disc protrusion or stenosis. L3-4: A broad-based disc protrusion is present. Moderate facet hypertrophy is noted bilaterally. This leads to mild subarticular narrowing, right greater than left. Mild foraminal narrowing is also present bilaterally. L4-5: There is uncovering of a broad-based disc protrusion. Advanced facet hypertrophy is noted. Mild subarticular and right foraminal narrowing is present. L5-S1: Mild facet hypertrophy is noted bilaterally. No significant disc protrusion or stenosis is present. There is desiccation of the disc. IMPRESSION: 1. Large right-sided kidney stone with moderate right-sided hydronephrosis. Noncontrast CT of the abdomen or ultrasound could be used for further evaluation if clinically indicated. 2. Remote superior endplate L4 fracture. 3. No acute fractures. 4. Scoliosis.  5. Severe right foraminal stenosis at T12-L1. 6. Moderate right foraminal stenosis at L1-2. 7. Mild subarticular and foraminal narrowing bilaterally at L3-4 is worse on the right. 8. Mild subarticular narrowing bilaterally and right foraminal stenosis at L4-5. 9. Desiccation the disc with mild bilateral facet hypertrophy at L5-S1 no significant focal stenosis. Electronically Signed   By: Marin Roberts M.D.   On: 10/10/2017 17:21       Subjective:  No new complaints.  Wants to go back to the apartment.  Discharge Exam: Vitals:   10/11/17 2042 10/12/17 0519  BP: (!) 141/84 (!) 153/78  Pulse: 87 94  Resp: 18 18  Temp: (!) 97.3 F (36.3 C) 100.3 F (37.9 C)  SpO2: 96% 97%   Vitals:   10/11/17 1417 10/11/17 1618 10/11/17 2042 10/12/17 0519  BP: (!) 147/80  (!) 141/84 (!) 153/78  Pulse: 74  87 94  Resp:   18 18  Temp: (!) 101.2 F (38.4 C) 98.4 F (36.9 C) (!) 97.3 F (36.3 C) 100.3 F (37.9 C)  TempSrc: Oral Oral Oral Oral  SpO2:   96% 97%  Weight:      Height:        General: Pt is alert, awake, not in acute distress Cardiovascular: RRR, S1/S2 +, no rubs, no gallops Respiratory: CTA bilaterally, no wheezing, no rhonchi Abdominal: Soft, NT, ND, bowel sounds + Extremities: no edema, no cyanosis    The results of significant diagnostics from this hospitalization (including imaging, microbiology, ancillary and laboratory) are listed below for reference.     Microbiology: Recent Results (from the past 240 hour(s))  Blood Culture (routine x 2)     Status: Abnormal   Collection Time: 10/09/17  3:55 PM  Result Value Ref Range Status   Specimen Description BLOOD LEFT WRIST  Final   Special Requests   Final    BOTTLES DRAWN AEROBIC AND ANAEROBIC Blood Culture adequate volume   Culture  Setup Time   Final    GRAM NEGATIVE RODS IN BOTH AEROBIC AND ANAEROBIC BOTTLES CRITICAL VALUE NOTED.  VALUE IS CONSISTENT WITH PREVIOUSLY REPORTED AND CALLED VALUE.    Culture (A)  Final    PROTEUS MIRABILIS SUSCEPTIBILITIES PERFORMED ON PREVIOUS CULTURE WITHIN THE LAST 5 DAYS. Performed at [facility], [address], [place], [place] [address]    Report Status 10/12/2017 FINAL  Final  Blood Culture (routine x 2)     Status: Abnormal   Collection Time: 10/09/17  3:55 PM  Result Value Ref Range Status   Specimen Description BLOOD BLOOD LEFT FOREARM  Final   Special Requests   Final    BOTTLES DRAWN AEROBIC AND ANAEROBIC Blood Culture adequate  volume   Culture  Setup Time   Final    GRAM NEGATIVE RODS IN BOTH AEROBIC AND ANAEROBIC BOTTLES CRITICAL RESULT CALLED TO, READ BACK BY AND VERIFIED WITH: Minna Antis [phone] MLM Performed at [facility], [address], [place], [place] [address]    Culture PROTEUS MIRABILIS (A)  Final   Report Status 10/12/2017 FINAL  Final   Organism ID, Bacteria PROTEUS MIRABILIS  Final      Susceptibility   Proteus mirabilis - MIC*    AMPICILLIN <=2 SENSITIVE Sensitive     CEFAZOLIN <=4 SENSITIVE Sensitive     CEFEPIME <=1 SENSITIVE Sensitive     CEFTAZIDIME <=1 SENSITIVE Sensitive     CEFTRIAXONE <=1 SENSITIVE Sensitive     CIPROFLOXACIN <=0.25 SENSITIVE Sensitive  GENTAMICIN <=1 SENSITIVE Sensitive     IMIPENEM 4 SENSITIVE Sensitive     TRIMETH/SULFA <=20 SENSITIVE Sensitive     AMPICILLIN/SULBACTAM <=2 SENSITIVE Sensitive     PIP/TAZO <=4 SENSITIVE Sensitive     * PROTEUS MIRABILIS  Urine culture     Status: Abnormal   Collection Time: 10/09/17  3:55 PM  Result Value Ref Range Status   Specimen Description URINE, RANDOM  Final   Special Requests NONE  Final   Culture >=100,000 COLONIES/mL PROTEUS MIRABILIS (A)  Final   Report Status 10/11/2017 FINAL  Final   Organism ID, Bacteria PROTEUS MIRABILIS (A)  Final      Susceptibility   Proteus mirabilis - MIC*    AMPICILLIN <=2 SENSITIVE Sensitive     CEFAZOLIN <=4 SENSITIVE Sensitive     CEFTRIAXONE <=1 SENSITIVE Sensitive     CIPROFLOXACIN <=0.25 SENSITIVE Sensitive     GENTAMICIN <=1 SENSITIVE Sensitive     IMIPENEM 8 INTERMEDIATE Intermediate     NITROFURANTOIN 128 RESISTANT Resistant     TRIMETH/SULFA <=20 SENSITIVE Sensitive     AMPICILLIN/SULBACTAM <=2 SENSITIVE Sensitive     PIP/TAZO <=4 SENSITIVE Sensitive     * >=100,000 COLONIES/mL PROTEUS MIRABILIS  Blood Culture ID Panel (Reflexed)     Status: Abnormal   Collection Time: 10/09/17  3:55 PM  Result Value Ref Range Status   Enterococcus species NOT DETECTED  NOT DETECTED Final   Listeria monocytogenes NOT DETECTED NOT DETECTED Final   Staphylococcus species NOT DETECTED NOT DETECTED Final   Staphylococcus aureus NOT DETECTED NOT DETECTED Final   Streptococcus species NOT DETECTED NOT DETECTED Final   Streptococcus agalactiae NOT DETECTED NOT DETECTED Final   Streptococcus pneumoniae NOT DETECTED NOT DETECTED Final   Streptococcus pyogenes NOT DETECTED NOT DETECTED Final   Acinetobacter baumannii NOT DETECTED NOT DETECTED Final   Enterobacteriaceae species DETECTED (A) NOT DETECTED Final    Comment: Enterobacteriaceae represent a large family of gram-negative bacteria, not a single organism. CRITICAL RESULT CALLED TO, READ BACK BY AND VERIFIED WITH: PHARMD M RENZ [phone] MLM    Enterobacter cloacae complex NOT DETECTED NOT DETECTED Final   Escherichia coli NOT DETECTED NOT DETECTED Final   Klebsiella oxytoca NOT DETECTED NOT DETECTED Final   Klebsiella pneumoniae NOT DETECTED NOT DETECTED Final   Proteus species DETECTED (A) NOT DETECTED Final    Comment: CRITICAL RESULT CALLED TO, READ BACK BY AND VERIFIED WITH: PHARMD M RENZ [phone] MLM    Serratia marcescens NOT DETECTED NOT DETECTED Final   Carbapenem resistance NOT DETECTED NOT DETECTED Final   Haemophilus influenzae NOT DETECTED NOT DETECTED Final   Neisseria meningitidis NOT DETECTED NOT DETECTED Final   Pseudomonas aeruginosa NOT DETECTED NOT DETECTED Final   Candida albicans NOT DETECTED NOT DETECTED Final   Candida glabrata NOT DETECTED NOT DETECTED Final   Candida krusei NOT DETECTED NOT DETECTED Final   Candida parapsilosis NOT DETECTED NOT DETECTED Final   Candida tropicalis NOT DETECTED NOT DETECTED Final    Comment: Performed at [facility], [address] [place], [place] [id number]     Labs: BNP (last 3 results) No results for input(s): BNP in the last 8760 hours. Basic Metabolic Panel:  Recent Labs Lab 10/09/17 1555 10/10/17 0528 10/11/17 0518  10/12/17 0624  NA 137 141  --  141  K 4.9 3.4* 3.7 3.2*  CL 104 110  --  108  CO2 20* 23  --  24  GLUCOSE  181* 114*  --  119*  BUN 22* 24*  --  14  CREATININE 1.69* 1.38* 1.31* 0.89  CALCIUM 8.7* 8.1*  --  8.4*   Liver Function Tests:  Recent Labs Lab 10/09/17 1555  AST 45*  ALT 26  ALKPHOS 56  BILITOT 2.0*  PROT 6.8  ALBUMIN 3.5    Recent Labs Lab 10/09/17 1555  LIPASE 19   No results for input(s): AMMONIA in the last 168 hours. CBC:  Recent Labs Lab 10/09/17 1555 10/10/17 0528 10/11/17 1209 10/12/17 0624  WBC 12.5* 18.1* 13.1* 10.1  NEUTROABS 12.0*  --  11.2*  --   HGB 13.0 11.8* 12.3 11.7*  HCT 39.0 34.8* 37.8 35.3*  MCV 96.8 97.5 96.7 95.4  PLT 202 155 151 148*   Cardiac Enzymes: No results for input(s): CKTOTAL, CKMB, CKMBINDEX, TROPONINI in the last 168 hours. BNP: Invalid input(s): POCBNP CBG: No results for input(s): GLUCAP in the last 168 hours. D-Dimer No results for input(s): DDIMER in the last 72 hours. Hgb A1c No results for input(s): HGBA1C in the last 72 hours. Lipid Profile No results for input(s): CHOL, HDL, LDLCALC, TRIG, CHOLHDL, LDLDIRECT in the last 72 hours. Thyroid function studies No results for input(s): TSH, T4TOTAL, T3FREE, THYROIDAB in the last 72 hours.  Invalid input(s): FREET3 Anemia work up No results for input(s): VITAMINB12, FOLATE, FERRITIN, TIBC, IRON, RETICCTPCT in the last 72 hours. Urinalysis    Component Value Date/Time   COLORURINE YELLOW 10/09/2017 1555   APPEARANCEUR HAZY (A) 10/09/2017 1555   LABSPEC 1.018 10/09/2017 1555   PHURINE 7.0 10/09/2017 1555   GLUCOSEU NEGATIVE 10/09/2017 1555   HGBUR MODERATE (A) 10/09/2017 1555   BILIRUBINUR NEGATIVE 10/09/2017 1555   KETONESUR 20 (A) 10/09/2017 1555   PROTEINUR 100 (A) 10/09/2017 1555   NITRITE POSITIVE (A) 10/09/2017 1555   LEUKOCYTESUR LARGE (A) 10/09/2017 1555   Sepsis Labs Invalid input(s): PROCALCITONIN,  WBC,  LACTICIDVEN Microbiology Recent  Results (from the past 240 hour(s))  Blood Culture (routine x 2)     Status: Abnormal   Collection Time: 10/09/17  3:55 PM  Result Value Ref Range Status   Specimen Description BLOOD LEFT WRIST  Final   Special Requests   Final    BOTTLES DRAWN AEROBIC AND ANAEROBIC Blood Culture adequate volume   Culture  Setup Time   Final    GRAM NEGATIVE RODS IN BOTH AEROBIC AND ANAEROBIC BOTTLES CRITICAL VALUE NOTED.  VALUE IS CONSISTENT WITH PREVIOUSLY REPORTED AND CALLED VALUE.    Culture (A)  Final    PROTEUS MIRABILIS SUSCEPTIBILITIES PERFORMED ON PREVIOUS CULTURE WITHIN THE LAST 5 DAYS. Performed at [facility], [address], [place], [place] [address]    Report Status 10/12/2017 FINAL  Final  Blood Culture (routine x 2)     Status: Abnormal   Collection Time: 10/09/17  3:55 PM  Result Value Ref Range Status   Specimen Description BLOOD BLOOD LEFT FOREARM  Final   Special Requests   Final    BOTTLES DRAWN AEROBIC AND ANAEROBIC Blood Culture adequate volume   Culture  Setup Time   Final    GRAM NEGATIVE RODS IN BOTH AEROBIC AND ANAEROBIC BOTTLES CRITICAL RESULT CALLED TO, READ BACK BY AND VERIFIED WITH: Minna Antis [phone] MLM Performed at [facility], [address] [place], [place] [address]    Culture PROTEUS MIRABILIS (A)  Final   Report Status 10/12/2017 FINAL  Final   Organism ID,  Bacteria PROTEUS MIRABILIS  Final      Susceptibility   Proteus mirabilis - MIC*    AMPICILLIN <=2 SENSITIVE Sensitive     CEFAZOLIN <=4 SENSITIVE Sensitive     CEFEPIME <=1 SENSITIVE Sensitive     CEFTAZIDIME <=1 SENSITIVE Sensitive     CEFTRIAXONE <=1 SENSITIVE Sensitive     CIPROFLOXACIN <=0.25 SENSITIVE Sensitive     GENTAMICIN <=1 SENSITIVE Sensitive     IMIPENEM 4 SENSITIVE Sensitive     TRIMETH/SULFA <=20 SENSITIVE Sensitive     AMPICILLIN/SULBACTAM <=2 SENSITIVE Sensitive     PIP/TAZO <=4 SENSITIVE Sensitive     * PROTEUS MIRABILIS  Urine culture     Status:  Abnormal   Collection Time: 10/09/17  3:55 PM  Result Value Ref Range Status   Specimen Description URINE, RANDOM  Final   Special Requests NONE  Final   Culture >=100,000 COLONIES/mL PROTEUS MIRABILIS (A)  Final   Report Status 10/11/2017 FINAL  Final   Organism ID, Bacteria PROTEUS MIRABILIS (A)  Final      Susceptibility   Proteus mirabilis - MIC*    AMPICILLIN <=2 SENSITIVE Sensitive     CEFAZOLIN <=4 SENSITIVE Sensitive     CEFTRIAXONE <=1 SENSITIVE Sensitive     CIPROFLOXACIN <=0.25 SENSITIVE Sensitive     GENTAMICIN <=1 SENSITIVE Sensitive     IMIPENEM 8 INTERMEDIATE Intermediate     NITROFURANTOIN 128 RESISTANT Resistant     TRIMETH/SULFA <=20 SENSITIVE Sensitive     AMPICILLIN/SULBACTAM <=2 SENSITIVE Sensitive     PIP/TAZO <=4 SENSITIVE Sensitive     * >=100,000 COLONIES/mL PROTEUS MIRABILIS  Blood Culture ID Panel (Reflexed)     Status: Abnormal   Collection Time: 10/09/17  3:55 PM  Result Value Ref Range Status   Enterococcus species NOT DETECTED NOT DETECTED Final   Listeria monocytogenes NOT DETECTED NOT DETECTED Final   Staphylococcus species NOT DETECTED NOT DETECTED Final   Staphylococcus aureus NOT DETECTED NOT DETECTED Final   Streptococcus species NOT DETECTED NOT DETECTED Final   Streptococcus agalactiae NOT DETECTED NOT DETECTED Final   Streptococcus pneumoniae NOT DETECTED NOT DETECTED Final   Streptococcus pyogenes NOT DETECTED NOT DETECTED Final   Acinetobacter baumannii NOT DETECTED NOT DETECTED Final   Enterobacteriaceae species DETECTED (A) NOT DETECTED Final    Comment: Enterobacteriaceae represent a large family of gram-negative bacteria, not a single organism. CRITICAL RESULT CALLED TO, READ BACK BY AND VERIFIED WITH: PHARMD M RENZ [phone] MLM    Enterobacter cloacae complex NOT DETECTED NOT DETECTED Final   Escherichia coli NOT DETECTED NOT DETECTED Final   Klebsiella oxytoca NOT DETECTED NOT DETECTED Final   Klebsiella pneumoniae NOT  DETECTED NOT DETECTED Final   Proteus species DETECTED (A) NOT DETECTED Final    Comment: CRITICAL RESULT CALLED TO, READ BACK BY AND VERIFIED WITH: PHARMD M RENZ [phone] MLM    Serratia marcescens NOT DETECTED NOT DETECTED Final   Carbapenem resistance NOT DETECTED NOT DETECTED Final   Haemophilus influenzae NOT DETECTED NOT DETECTED Final   Neisseria meningitidis NOT DETECTED NOT DETECTED Final   Pseudomonas aeruginosa NOT DETECTED NOT DETECTED Final   Candida albicans NOT DETECTED NOT DETECTED Final   Candida glabrata NOT DETECTED NOT DETECTED Final   Candida krusei NOT DETECTED NOT DETECTED Final   Candida parapsilosis NOT DETECTED NOT DETECTED Final   Candida tropicalis NOT DETECTED NOT DETECTED Final    Comment: Performed at [facility], [address] [place],  [place] [address]     Time coordinating discharge: Over 30 minutes  SIGNED:   Kathlen Mody, MD  [facility] 10/12/2017, 10:26 AM Pager   If 7PM-7AM, please contact night-coverage [email or web address] Password [email or web address]

## 2017-10-13 ENCOUNTER — Telehealth: Payer: Self-pay

## 2017-10-13 NOTE — Telephone Encounter (Signed)
Spoke with the patient's husband and scheduled a appointment for her to be seen in clinic October 17, 2017 at 1:30 pm for a hospital follow up.

## 2017-10-13 NOTE — Telephone Encounter (Signed)
Possible re-admission to facility. [facility] F/U is needed if patient was re-admitted to facility upon discharge. [facility]  [facility].  Hospital follow up was scheduled for 10/17/17.

## 2017-10-14 ENCOUNTER — Non-Acute Institutional Stay: Payer: Medicare Other

## 2017-10-14 VITALS — BP 140/80 | HR 84 | Temp 98.1°F | Ht 63.0 in | Wt 137.0 lb

## 2017-10-14 DIAGNOSIS — Z0000 Encounter for general adult medical examination without abnormal findings: Secondary | ICD-10-CM

## 2017-10-14 MED ORDER — PNEUMOCOCCAL 13-VAL CONJ VACC IM SUSP: 0.5000 mL | INTRAMUSCULAR | 0 refills | Status: AC

## 2017-10-14 NOTE — Patient Instructions (Signed)
Ana [name] , Thank you for taking time to come for your Medicare Wellness Visit. I appreciate your ongoing commitment to your health goals. Please review the following plan we discussed and let me know if I can assist you in the future.   Screening recommendations/referrals: Colonoscopy excluded, you are over age 81 Mammogram excluded, you are over age 81 Bone Density due, Dr Glade LloydPandey has ordered this for you Recommended yearly ophthalmology/optometry visit for glaucoma screening and checkup Recommended yearly dental visit for hygiene and checkup  Vaccinations: Influenza vaccine up to date, due 2019 fall season Pneumococcal vaccine 13 dur, prescription sent to pharmacy Tdap vaccine up to date. Due 12/14/2023 Shingles vaccine due, declined    Advanced directives: In Chart  Conditions/risks identified: None  Next appointment: Dr. Glade LloydPandey 10/17/2017 @ 3:30pm   Preventive Care 65 Years and Older, Female Preventive care refers to lifestyle choices and visits with your health care provider that can promote health and wellness. What does preventive care include?  A yearly physical exam. This is also called an annual well check.  Dental exams once or twice a year.  Routine eye exams. Ask your health care provider how often you should have your eyes checked.  Personal lifestyle choices, including:  Daily care of your teeth and gums.  Regular physical activity.  Eating a healthy diet.  Avoiding tobacco and drug use.  Limiting alcohol use.  Practicing safe sex.  Taking low-dose aspirin every day.  Taking vitamin and mineral supplements as recommended by your health care provider. What happens during an annual well check? The services and screenings done by your health care provider during your annual well check will depend on your age, overall health, lifestyle risk factors, and family history of disease. Counseling  Your health care provider may ask you questions about  your:  Alcohol use.  Tobacco use.  Drug use.  Emotional well-being.  Home and relationship well-being.  Sexual activity.  Eating habits.  History of falls.  Memory and ability to understand (cognition).  Work and work Astronomerenvironment.  Reproductive health. Screening  You may have the following tests or measurements:  Height, weight, and BMI.  Blood pressure.  Lipid and cholesterol levels. These may be checked every 5 years, or more frequently if you are over 81 years old.  Skin check.  Lung cancer screening. You may have this screening every year starting at age 81 if you have a 30-pack-year history of smoking and currently smoke or have quit within the past 15 years.  Fecal occult blood test (FOBT) of the stool. You may have this test every year starting at age 81.  Flexible sigmoidoscopy or colonoscopy. You may have a sigmoidoscopy every 5 years or a colonoscopy every 10 years starting at age 81.  Hepatitis C blood test.  Hepatitis B blood test.  Sexually transmitted disease (STD) testing.  Diabetes screening. This is done by checking your blood sugar (glucose) after you have not eaten for a while (fasting). You may have this done every 1-3 years.  Bone density scan. This is done to screen for osteoporosis. You may have this done starting at age 265.  Mammogram. This may be done every 1-2 years. Talk to your health care provider about how often you should have regular mammograms. Talk with your health care provider about your test results, treatment options, and if necessary, the need for more tests. Vaccines  Your health care provider may recommend certain vaccines, such as:  Influenza vaccine. This is  recommended every year.  Tetanus, diphtheria, and acellular pertussis (Tdap, Td) vaccine. You may need a Td booster every 10 years.  Zoster vaccine. You may need this after age 29.  Pneumococcal 13-valent conjugate (PCV13) vaccine. One dose is recommended  after age 1.  Pneumococcal polysaccharide (PPSV23) vaccine. One dose is recommended after age 55. Talk to your health care provider about which screenings and vaccines you need and how often you need them. This information is not intended to replace advice given to you by your health care provider. Make sure you discuss any questions you have with your health care provider. Document Released: 12/26/2015 Document Revised: 08/18/2016 Document Reviewed: 09/30/2015 Elsevier Interactive Patient Education  2017 [place] Prevention in the Home Falls can cause injuries. They can happen to people of all ages. There are many things you can do to make your home safe and to help prevent falls. What can I do on the outside of my home?  Regularly fix the edges of walkways and driveways and fix any cracks.  Remove anything that might make you trip as you walk through a door, such as a raised step or threshold.  Trim any bushes or trees on the path to your home.  Use bright outdoor lighting.  Clear any walking paths of anything that might make someone trip, such as rocks or tools.  Regularly check to see if handrails are loose or broken. Make sure that both sides of any steps have handrails.  Any raised decks and porches should have guardrails on the edges.  Have any leaves, snow, or ice cleared regularly.  Use sand or salt on walking paths during winter.  Clean up any spills in your garage right away. This includes oil or grease spills. What can I do in the bathroom?  Use night lights.  Install grab bars by the toilet and in the tub and shower. Do not use towel bars as grab bars.  Use non-skid mats or decals in the tub or shower.  If you need to sit down in the shower, use a plastic, non-slip stool.  Keep the floor dry. Clean up any water that spills on the floor as soon as it happens.  Remove soap buildup in the tub or shower regularly.  Attach bath mats securely with  double-sided non-slip rug tape.  Do not have throw rugs and other things on the floor that can make you trip. What can I do in the bedroom?  Use night lights.  Make sure that you have a light by your bed that is easy to reach.  Do not use any sheets or blankets that are too big for your bed. They should not hang down onto the floor.  Have a firm chair that has side arms. You can use this for support while you get dressed.  Do not have throw rugs and other things on the floor that can make you trip. What can I do in the kitchen?  Clean up any spills right away.  Avoid walking on wet floors.  Keep items that you use a lot in easy-to-reach places.  If you need to reach something above you, use a strong step stool that has a grab bar.  Keep electrical cords out of the way.  Do not use floor polish or wax that makes floors slippery. If you must use wax, use non-skid floor wax.  Do not have throw rugs and other things on the floor that can make you trip.  What can I do with my stairs?  Do not leave any items on the stairs.  Make sure that there are handrails on both sides of the stairs and use them. Fix handrails that are broken or loose. Make sure that handrails are as long as the stairways.  Check any carpeting to make sure that it is firmly attached to the stairs. Fix any carpet that is loose or worn.  Avoid having throw rugs at the top or bottom of the stairs. If you do have throw rugs, attach them to the floor with carpet tape.  Make sure that you have a light switch at the top of the stairs and the bottom of the stairs. If you do not have them, ask someone to add them for you. What else can I do to help prevent falls?  Wear shoes that:  Do not have high heels.  Have rubber bottoms.  Are comfortable and fit you well.  Are closed at the toe. Do not wear sandals.  If you use a stepladder:  Make sure that it is fully opened. Do not climb a closed stepladder.  Make  sure that both sides of the stepladder are locked into place.  Ask someone to hold it for you, if possible.  Clearly mark and make sure that you can see:  Any grab bars or handrails.  First and last steps.  Where the edge of each step is.  Use tools that help you move around (mobility aids) if they are needed. These include:  Canes.  Walkers.  Scooters.  Crutches.  Turn on the lights when you go into a dark area. Replace any light bulbs as soon as they burn out.  Set up your furniture so you have a clear path. Avoid moving your furniture around.  If any of your floors are uneven, fix them.  If there are any pets around you, be aware of where they are.  Review your medicines with your doctor. Some medicines can make you feel dizzy. This can increase your chance of falling. Ask your doctor what other things that you can do to help prevent falls. This information is not intended to replace advice given to you by your health care provider. Make sure you discuss any questions you have with your health care provider. Document Released: 09/25/2009 Document Revised: 05/06/2016 Document Reviewed: 01/03/2015 Elsevier Interactive Patient Education  2017 [employer].

## 2017-10-14 NOTE — Progress Notes (Addendum)
Subjective:   Ana [name] is a 83 y.o. female who presents for an Initial Medicare Annual Wellness Visit at [facility]       Objective:    Today's Vitals   10/14/17 1423  BP: 140/80  Pulse: 84  Temp: 98.1 F (36.7 C)  TempSrc: Oral  SpO2: 97%  Weight: 137 lb (62.1 kg)  Height: 5\' 3"  (1.6 m)   Body mass index is 24.27 kg/m.   Current Medications (verified) Outpatient Encounter Prescriptions as of 10/14/2017  Medication Sig  . amoxicillin-clavulanate (AUGMENTIN) 875-125 MG tablet Take 1 tablet by mouth every 12 (twelve) hours.  Marland Kitchen aspirin 325 MG EC tablet Take 325 mg by mouth daily.  . calcium carbonate (OS-CAL) 600 MG TABS tablet Take 1 tablet (600 mg total) by mouth 2 (two) times daily with a meal.  . Cholecalciferol (VITAMIN D3) 1000 UNITS CAPS Take by mouth. Take one tablet daily  . diphenhydrAMINE (BENADRYL) 25 MG tablet Take 25 mg by mouth every 6 (six) hours as needed for itching or allergies.  Marland Kitchen donepezil (ARICEPT) 10 MG tablet TAKE ONE TABLET BY MOUTH DAILY TO PRESERVE MEMORY  . pneumococcal 13-valent conjugate vaccine (PREVNAR 13) SUSP injection Inject 0.5 mLs into the muscle tomorrow at 10 am.  . vitamin B-12 (CYANOCOBALAMIN) 1000 MCG tablet Take 1,000 mcg by mouth daily.  . [DISCONTINUED] pneumococcal 13-valent conjugate vaccine (PREVNAR 13) SUSP injection Inject 0.5 mLs into the muscle tomorrow at 10 am.   No facility-administered encounter medications on file as of 10/14/2017.     Allergies (verified) Ace inhibitors; Almond oil; and Plum pulp   History: Past Medical History:  Diagnosis Date  . Allergic rhinitis   . Anxiety   . Balance problem 04/28/2015  . Fatigue 04/28/2015  . Hearing loss 04/28/2015  . history of Fracture of right humerus 04/28/2015  . Hyperglycemia   . Hyperlipidemia   . Hypertension   . Memory loss 04/28/2015   04/26/2014 MMSE 28/30. Failed clock drawing. 04/21/15 MMSE 21/30. Failed clock  drawing.   . Osteoporosis    Past Surgical History:  Procedure Laterality Date  . ABDOMINAL HYSTERECTOMY    . CATARACT EXTRACTION  2010  . ELBOW SURGERY Right 1988  . FEMUR FRACTURE SURGERY Right 06/2014   [place], [place] Dr. Dione Housekeeper  . HUMERUS FRACTURE SURGERY Right 2015   [place], [place] Dr. Eber Hong  . TONSILLECTOMY  1935   Family History  Problem Relation Age of Onset  . Heart disease Mother   . Diabetes Daughter    Social History   Occupational History  . Housewife    Social History Main Topics  . Smoking status: Never Smoker  . Smokeless tobacco: Never Used  . Alcohol use 0.6 - 1.2 oz/week    1 - 2 Glasses of wine per week     Comment: 1-2 glasses 1-2 times a week  . Drug use: No  . Sexual activity: Not on file    Tobacco Counseling Counseling given: Not Answered   Activities of Daily Living In your present state of health, do you have any difficulty performing the following activities: 10/14/2017 10/09/2017  Hearing? N N  Vision? N N  Difficulty concentrating or making decisions? Malvin Johns  Walking or climbing stairs? Y Y  Dressing or bathing? Y N  Doing errands, shopping? Malvin Johns  Preparing Food and eating ? Y -  Using the Toilet? N -  In the past six months, have  you accidently leaked urine? Y -  Do you have problems with loss of bowel control? N -  Managing your Medications? Y -  Managing your Finances? Y -  Housekeeping or managing your Housekeeping? Y -  Some recent data might be hidden    Immunizations and Health Maintenance Immunization History  Administered Date(s) Administered  . DTaP 05/12/2014  . Influenza, High Dose Seasonal PF 09/21/2017  . Influenza-Unspecified 10/07/2014, 09/11/2015, 09/23/2016  . PPD Test 10/16/2014  . Pneumococcal Polysaccharide-23 08/20/2011   Health Maintenance Due  Topic Date Due  . DEXA SCAN  03/21/1993  . PNA vac Low Risk Adult (2 of 2 - PCV13) 08/19/2012    Patient Care Team: Oneal GroutPandey, Mahima, MD as PCP -  General (Internal Medicine) Ngetich, Donalee Citrininah C, NP as Nurse Practitioner (Family Medicine)  Indicate any recent Medical Services you may have received from other than [facility] providers in the past year (date may be approximate).     Assessment:   This is a routine wellness examination for Ana [name].    Hearing/Vision screen No exam data present  Dietary issues and exercise activities discussed: Current Exercise Habits: The patient does not participate in regular exercise at present, Exercise limited by: None identified  Goals    . Maintain Ilfestlye          Starting today pt will maintain lifestyle.       Depression Screen PHQ 2/9 Scores 10/14/2017 08/24/2016 01/06/2015  PHQ - 2 Score 0 0 1    Fall Risk Fall Risk  10/14/2017 02/17/2016 01/06/2015  Falls in the past year? No No Yes  Comment - - fx (R) humerus; fx (R) femur  Number falls in past yr: - - 2 or more  Risk for fall due to : - - History of fall(s)    Cognitive Function: MMSE - Mini Mental State Exam 09/07/2017 09/07/2017 08/24/2016 08/19/2015 04/22/2015  Not completed: - (No Data) - - -  Orientation to time 4 4 5 4 2   Orientation to Place 4 4 5 5 4   Registration 3 3 3 3 3   Attention/ Calculation 4 4 5 4 2   Recall 2 2 3 3 2   Language- name 2 objects 2 2 2 2 2   Language- repeat 1 1 1 1 1   Language- follow 3 step command 3 3 3 3 3   Language- read & follow direction 1 1 1 1 1   Write a sentence 1 1 1 1 1   Copy design 1 1 1 1  0  Total score 26 26 30 28 21         Screening Tests Health Maintenance  Topic Date Due  . DEXA SCAN  03/21/1993  . PNA vac Low Risk Adult (2 of 2 - PCV13) 08/19/2012  . TETANUS/TDAP  12/14/2023  . INFLUENZA VACCINE  Completed      Plan:    I have personally reviewed and addressed the Medicare Annual Wellness questionnaire and have noted the following in the patient's chart:  A. Medical and social history B. Use of alcohol, tobacco or illicit drugs  C. Current medications and  supplements D. Functional ability and status E.  Nutritional status F.  Physical activity G. Advance directives H. List of other physicians I.  Hospitalizations, surgeries, and ER visits in previous 12 months J.  Vitals K. Screenings to include hearing, vision, cognitive, depression L. Referrals and appointments - none  In addition, I have reviewed and discussed with patient certain preventive protocols, quality metrics, and best  practice recommendations. A written personalized care plan for preventive services as well as general preventive health recommendations were provided to patient.  See attached scanned questionnaire for additional information.   Signed,   Annetta Maw, RN Nurse Health Advisor   Quick Notes   Health Maintenance: Prevnar sent to pharmacy. Shingrix declined     Abnormal Screen: MMSE 26/30, on 09/07/2017     Patient Concerns: None     Nurse Concerns: None

## 2017-10-16 LAB — CULTURE, BLOOD (ROUTINE X 2)
CULTURE: NO GROWTH
Culture: NO GROWTH
SPECIAL REQUESTS: ADEQUATE
Special Requests: ADEQUATE

## 2017-10-17 ENCOUNTER — Non-Acute Institutional Stay: Payer: Medicare Other | Admitting: Internal Medicine

## 2017-10-17 ENCOUNTER — Encounter: Payer: Self-pay | Admitting: Internal Medicine

## 2017-10-17 VITALS — BP 124/68 | HR 61 | Temp 99.7°F | Resp 16 | Ht 63.0 in | Wt 137.4 lb

## 2017-10-17 DIAGNOSIS — R7881 Bacteremia: Secondary | ICD-10-CM

## 2017-10-17 DIAGNOSIS — B964 Proteus (mirabilis) (morganii) as the cause of diseases classified elsewhere: Secondary | ICD-10-CM

## 2017-10-17 DIAGNOSIS — R413 Other amnesia: Secondary | ICD-10-CM | POA: Diagnosis not present

## 2017-10-17 DIAGNOSIS — A498 Other bacterial infections of unspecified site: Secondary | ICD-10-CM

## 2017-10-17 DIAGNOSIS — R509 Fever, unspecified: Secondary | ICD-10-CM | POA: Diagnosis not present

## 2017-10-17 DIAGNOSIS — N1330 Unspecified hydronephrosis: Secondary | ICD-10-CM

## 2017-10-17 DIAGNOSIS — N179 Acute kidney failure, unspecified: Secondary | ICD-10-CM

## 2017-10-17 DIAGNOSIS — E876 Hypokalemia: Secondary | ICD-10-CM | POA: Diagnosis not present

## 2017-10-17 NOTE — Progress Notes (Addendum)
[facility]  Provider: Oneal Grout MD   Location:  [facility]   Place of Service:  Clinic (12)  PCP: Oneal Grout, MD Patient Care Team: Oneal Grout, MD as PCP - General (Internal Medicine) Ngetich, Donalee Citrin, NP as Nurse Practitioner (Family Medicine)  Extended Emergency Contact Information Primary Emergency Contact: Gordan,[place] Address: [address]          [place], [place] [address] Darden Amber of [place] Home Phone: [phone] Relation: Daughter Secondary Emergency Contact: Iven Finn Address: [address]          [place], [place] [address] Darden Amber of [place] Home Phone: [phone] Relation: Spouse   Goals of Care: Advanced Directive information Advanced Directives 10/14/2017  Does Patient Have a Medical Advance Directive? Yes  Type of Advance Directive Healthcare Power of Attorney  Does patient want to make changes to medical advance directive? No - Patient declined  Copy of Healthcare Power of Attorney in Chart? Yes  Pre-existing out of facility DNR order (yellow form or pink MOST form) -      Chief Complaint  Patient presents with  . Hospitalization Follow-up    Patient stated that she feels fine. Patient currently has a runny nose.   . Medication Refill    No refills needed at this time.     HPI: Patient is a 83 y.o. female seen today for hospital follow up visit. She was in the hospital from 10/09/17-10/12/17 with fever and change in mental state. She had leukocytosis and workup revealed proteus mirabilis in urine and blood. She also had acute renal failure. was placed on iv antibiotic and iv fluids and later discharged home on po augmentin. Today at the office, she has a low grade temperature. 2 sets of most recent blood culture from 10/11/17 show no bacterial growth. Her lytes were repleted. She has medical history of HTN, HLD, dementia and is here with her husband.   Past Medical History:  Diagnosis Date  .  Allergic rhinitis   . Anxiety   . Balance problem 04/28/2015  . Fatigue 04/28/2015  . Hearing loss 04/28/2015  . history of Fracture of right humerus 04/28/2015  . Hyperglycemia   . Hyperlipidemia   . Hypertension   . Memory loss 04/28/2015   04/26/2014 MMSE 28/30. Failed clock drawing. 04/21/15 MMSE 21/30. Failed clock drawing.   . Osteoporosis    Past Surgical History:  Procedure Laterality Date  . ABDOMINAL HYSTERECTOMY    . CATARACT EXTRACTION  2010  . ELBOW SURGERY Right 1988  . FEMUR FRACTURE SURGERY Right 06/2014   [place], [place] Dr. Dione Housekeeper  . HUMERUS FRACTURE SURGERY Right 2015   [place], [place] Dr. Eber Hong  . TONSILLECTOMY  1935    reports that  has never smoked. she has never used smokeless tobacco. She reports that she drinks about 0.6 - 1.2 oz of alcohol per week. She reports that she does not use drugs. Social History   Socioeconomic History  . Marital status: Unknown    Spouse name: Not on file  . Number of children: Not on file  . Years of education: Not on file  . Highest education level: Not on file  Social Needs  . Financial resource strain: Not on file  . Food insecurity - worry: Not on file  . Food insecurity - inability: Not on file  . Transportation needs - medical: Not on file  . Transportation needs - non-medical: Not on file  Occupational  History  . Occupation: Housewife  Tobacco Use  . Smoking status: Never Smoker  . Smokeless tobacco: Never Used  Substance and Sexual Activity  . Alcohol use: Yes    Alcohol/week: 0.6 - 1.2 oz    Types: 1 - 2 Glasses of wine per week    Comment: 1-2 glasses 1-2 times a week  . Drug use: No  . Sexual activity: Not on file  Other Topics Concern  . Not on file  Social History Narrative   [facility] at [facility] since [facility]/28[facility]14   Married [name]   Never smoked   Alcohol -wine 1-2 daily   Caffeine coffee 2 daily   Exercise walking   Walks with walker   Living Will, POA, DNR     Family History    Problem Relation Age of Onset  . Heart disease Mother   . Diabetes Daughter     Health Maintenance  Topic Date Due  . DEXA SCAN  03/21/1993  . PNA vac Low Risk Adult (2 of 2 - PCV13) 08/19/2012  . TETANUS/TDAP  12/14/2023  . INFLUENZA VACCINE  Completed    Allergies  Allergen Reactions  . Ace Inhibitors   . Almond Oil   . Plum Pulp     Outpatient Encounter Medications as of 10/17/2017  Medication Sig  . amoxicillin-clavulanate (AUGMENTIN) 875-125 MG tablet Take 1 tablet by mouth every 12 (twelve) hours.  Marland Kitchen aspirin 325 MG EC tablet Take 325 mg as needed by mouth.   . calcium carbonate (OS-CAL) 600 MG TABS tablet Take 1 tablet (600 mg total) by mouth 2 (two) times daily with a meal.  . Cholecalciferol (VITAMIN D3) 1000 UNITS CAPS Take 1,000 Units daily by mouth.   . diphenhydrAMINE (BENADRYL) 25 MG tablet Take 25 mg by mouth every 6 (six) hours as needed for itching or allergies.  Marland Kitchen donepezil (ARICEPT) 10 MG tablet TAKE ONE TABLET BY MOUTH DAILY TO PRESERVE MEMORY  . vitamin B-12 (CYANOCOBALAMIN) 1000 MCG tablet Take 1,000 mcg by mouth daily.   No facility-administered encounter medications on file as of 10/17/2017.     Review of Systems  Constitutional: Positive for fatigue. Negative for appetite change, chills, diaphoresis and fever.  HENT: Positive for congestion. Negative for ear pain, mouth sores, postnasal drip, sinus pressure, sinus pain, sore throat and trouble swallowing.   Eyes: Negative for visual disturbance.  Respiratory: Positive for cough. Negative for shortness of breath and wheezing.        Chronic cough, mostly dry  Cardiovascular: Negative for chest pain, palpitations and leg swelling.  Gastrointestinal: Negative for abdominal pain, blood in stool, diarrhea, nausea and vomiting.       Moved her bowels today  Genitourinary: Positive for frequency and urgency. Negative for dysuria, flank pain, hematuria and pelvic pain.       Uses diaper pad   Musculoskeletal: Positive for gait problem. Negative for back pain.       Uses a walker. No fall reported  Skin: Negative for rash.  Neurological: Positive for weakness. Negative for dizziness, syncope and headaches.  Hematological: Bruises/bleeds easily.  Psychiatric/Behavioral: Positive for behavioral problems. Negative for agitation, hallucinations and sleep disturbance. The patient is not nervous/anxious.        No worsening of dementia.     Vitals:   10/17/17 1528  BP: 124/68  Pulse: 61  Resp: 16  Temp: 99.7 F (37.6 C)  TempSrc: Tympanic  SpO2: 95%  Weight: 137 lb 6.4 oz (62.3 kg)  Height: 5\' 3"  (1.6 m)   Body mass index is 24.34 kg/m.   Wt Readings from Last 3 Encounters:  10/17/17 137 lb 6.4 oz (62.3 kg)  10/14/17 137 lb (62.1 kg)  10/09/17 137 lb 2 oz (62.2 kg)   Physical Exam  Constitutional: She is oriented to person, place, and time. She appears well-developed and well-nourished. No distress.  HENT:  Head: Normocephalic and atraumatic.  Mouth/Throat: Oropharynx is clear and moist. No oropharyngeal exudate.  Eyes: Conjunctivae and EOM are normal. Pupils are equal, round, and reactive to light.  Neck: Normal range of motion. Neck supple.  Cardiovascular: Normal rate and regular rhythm.  Pulmonary/Chest: Effort normal and breath sounds normal.  Abdominal: Soft. Bowel sounds are normal. There is no tenderness. There is no rebound and no guarding.  Musculoskeletal: Normal range of motion. She exhibits no edema.  Lymphadenopathy:    She has no cervical adenopathy.  Neurological: She is alert and oriented to person, place, and time.  Skin: Skin is warm and dry. No rash noted. She is not diaphoretic.  Psychiatric: She has a normal mood and affect.    Labs reviewed: Basic Metabolic Panel: Recent Labs    10/09/17 1555 10/10/17 0528 10/11/17 0518 10/12/17 0624  NA 137 141  --  141  K 4.9 3.4* 3.7 3.2*  CL 104 110  --  108  CO2 20* 23  --  24  GLUCOSE 181*  114*  --  119*  BUN 22* 24*  --  14  CREATININE 1.69* 1.38* 1.31* 0.89  CALCIUM 8.7* 8.1*  --  8.4*   Liver Function Tests: Recent Labs    02/16/17 0001 09/14/17 0000 10/09/17 1555  AST 14 13 45*  ALT 11 10 26   ALKPHOS 55  --  56  BILITOT 0.6 0.4 2.0*  PROT 6.3 6.5 6.8  ALBUMIN 4.1  --  3.5   Recent Labs    10/09/17 1555  LIPASE 19   No results for input(s): AMMONIA in the last 8760 hours. CBC: Recent Labs    09/14/17 0000 10/09/17 1555 10/10/17 0528 10/11/17 1209 10/12/17 0624  WBC 8.5 12.5* 18.1* 13.1* 10.1  NEUTROABS 3,944 12.0*  --  11.2*  --   HGB 13.4 13.0 11.8* 12.3 11.7*  HCT 40.0 39.0 34.8* 37.8 35.3*  MCV 93.9 96.8 97.5 96.7 95.4  PLT 242 202 155 151 148*   Cardiac Enzymes: No results for input(s): CKTOTAL, CKMB, CKMBINDEX, TROPONINI in the last 8760 hours. BNP: Invalid input(s): POCBNP Lab Results  Component Value Date   HGBA1C 5.8 04/17/2015   Lab Results  Component Value Date   TSH 2.18 09/14/2017   No results found for: VITAMINB12 No results found for: FOLATE No results found for: IRON, TIBC, FERRITIN  Lipid Panel: Recent Labs    02/16/17 0001 09/14/17 0000  CHOL 272* 246*  HDL 53 55  LDLCALC 184*  --   TRIG 174* 121  CHOLHDL 5.1* 4.5   Lab Results  Component Value Date   HGBA1C 5.8 04/17/2015    Procedures since last visit: Dg Chest 2 View  Result Date: 10/09/2017 CLINICAL DATA:  Dementia and sepsis EXAM: CHEST  2 VIEW COMPARISON:  None. FINDINGS: The patient is rotated. There is no lobar consolidation or pulmonary edema. No pleural effusion or pneumothorax. There is multilevel thoracic and lumbar vertebral body height loss, including age-indeterminate compression fracture of the lower lumbar spine with associated grade 1 anterolisthesis. IMPRESSION: 1. No active cardiopulmonary disease. 2. Age  indeterminate compression fracture of a lower lumbar vertebra with associated grade 1 anterolisthesis. There is also moderate to  advanced vertebral body height loss at multiple levels of the thoracic spine. If there has been recent trauma or other reason to suspect an acute fracture of the vertebral column, consider MRI. Electronically Signed   By: Deatra Robinson M.D.   On: 10/09/2017 16:45   Dg Lumbar Spine Complete  Result Date: 10/10/2017 CLINICAL DATA:  83 y/o  F; lower back pain. EXAM: LUMBAR SPINE - COMPLETE 4+ VIEW COMPARISON:  10/10/2017 MRI of the lumbar spine. FINDINGS: Partially visualized right proximal femur screw. Calcific density projecting over right renal pelvis, likely large kidney stone. Calcific atherosclerosis of abdominal aorta. Mild dextrocurvature with apex at L1. Moderate L4 vertebral body compression deformity in grade 1 L4-5 anterolisthesis. Multilevel degenerative changes of the spine with marginal osteophytes and severe facet arthropathy. IMPRESSION: 1. Mild lumbar levocurvature.  Grade 1 L4-5 anterolisthesis. 2. Moderate L4 vertebral body compression deformity. Electronically Signed   By: Mitzi Hansen M.D.   On: 10/10/2017 19:32   Mr Lumbar Spine Wo Contrast  Result Date: 10/10/2017 CLINICAL DATA:  Abnormal x-ray of the lumbosacral spine. Degenerative joint disease. Falls. EXAM: MRI LUMBAR SPINE WITHOUT CONTRAST TECHNIQUE: Multiplanar, multisequence MR imaging of the lumbar spine was performed. No intravenous contrast was administered. COMPARISON:  None. FINDINGS: Segmentation: 5 non rib-bearing lumbar type vertebral bodies are present. Alignment: Degenerative grade 1 anterolisthesis is present at L3-4 and L4-5. Levoconvex curvature of the lumbar spine is centered at L2. Vertebrae: A remote superior endplate fracture is present at L4 without significant retropulsion of bone. Chronic endplate marrow changes are present on the right at T11-12 and T12-L1. Marrow signal is otherwise within normal limits. Conus medullaris: Extends to the T12-L1 level and appears normal. Paraspinal and other soft  tissues: Right-sided hydronephrosis is present. A 13 mm stone is present at the right UPJ. Scattered small cysts are present in the left kidney. There is no significant adenopathy. Disc levels: T12-L1: Asymmetric right-sided facet hypertrophy and a broad-based disc protrusion contribute to severe right foraminal narrowing. The central canal and left foramen are patent. L1-2: Moderate facet hypertrophy is worse on the right. Moderate right foraminal narrowing is present. The central canal and left foramen are patent. L2-3: Moderate facet hypertrophy is worse on the right. There is no significant disc protrusion or stenosis. L3-4: A broad-based disc protrusion is present. Moderate facet hypertrophy is noted bilaterally. This leads to mild subarticular narrowing, right greater than left. Mild foraminal narrowing is also present bilaterally. L4-5: There is uncovering of a broad-based disc protrusion. Advanced facet hypertrophy is noted. Mild subarticular and right foraminal narrowing is present. L5-S1: Mild facet hypertrophy is noted bilaterally. No significant disc protrusion or stenosis is present. There is desiccation of the disc. IMPRESSION: 1. Large right-sided kidney stone with moderate right-sided hydronephrosis. Noncontrast CT of the abdomen or ultrasound could be used for further evaluation if clinically indicated. 2. Remote superior endplate L4 fracture. 3. No acute fractures. 4. Scoliosis. 5. Severe right foraminal stenosis at T12-L1. 6. Moderate right foraminal stenosis at L1-2. 7. Mild subarticular and foraminal narrowing bilaterally at L3-4 is worse on the right. 8. Mild subarticular narrowing bilaterally and right foraminal stenosis at L4-5. 9. Desiccation the disc with mild bilateral facet hypertrophy at L5-S1 no significant focal stenosis. Electronically Signed   By: Marin Roberts M.D.   On: 10/10/2017 17:21    Assessment/Plan  Proteus UTI With positive blood and  urine culture. Continue and  complete course of augmentin 875 mg q12h on 10/24/17. Encouraged hydration and yoghurt on daily basis.  Hypokalemia Check bmp  Bacteremia With fever this visit. Monitor temp and wbc curve. Continue and complete her antibiotic course  Fever Initial temp 100.1 and repeat 99.7. Low grade temp this visit. Denies chills. Appetite is good. Check cbc with diff and cmp in am. monitor temperature curve at home. Her uti and bacteremia are likely contributing to this.   Right hydronephrosis With large right sided Kidney stone noted on MRI. Currently she is asymptomatic. Maintain hydration. Monitor for now. Urology referral  Dementia with behavioral disturbance Continue donepezil 10 mg daily  ARF S/p iv fluid in hospital. Encouraged hydration. Check bmp.    Labs/tests ordered:  Cbc with diff, CMP  Next appointment: 4 months or earlier if needed  Communication: reviewed care plan with patient and her husband    Oneal Grout, MD Internal Medicine [facility] [address] [place], [place] [address] Cell Phone (Monday-Friday 8 am - 5 pm): [phone] On Call: [phone] and follow prompts after 5 pm and on weekends Office Phone: [phone] Office Fax: [phone]

## 2017-10-17 NOTE — Patient Instructions (Signed)
  Check temperature daily or twice a day at home for 1 week. If she continues to have temperature > 99.7, notify us.  complete your antibiotic course Drink plenty of water.  Take a cup of yoghurt every day.

## 2017-10-18 NOTE — Addendum Note (Signed)
Addended byOneal Grout: Kyndle Schlender on: 10/18/2017 12:28 PM   Modules accepted: Orders

## 2017-10-20 DIAGNOSIS — R509 Fever, unspecified: Secondary | ICD-10-CM | POA: Diagnosis not present

## 2017-10-20 DIAGNOSIS — B964 Proteus (mirabilis) (morganii) as the cause of diseases classified elsewhere: Secondary | ICD-10-CM | POA: Diagnosis not present

## 2017-10-20 DIAGNOSIS — E876 Hypokalemia: Secondary | ICD-10-CM | POA: Diagnosis not present

## 2017-10-20 DIAGNOSIS — N179 Acute kidney failure, unspecified: Secondary | ICD-10-CM | POA: Diagnosis not present

## 2017-10-20 LAB — COMPLETE METABOLIC PANEL WITH GFR
AG Ratio: 1.2 (calc) (ref 1.0–2.5)
ALBUMIN MSPROF: 3.2 g/dL — AB (ref 3.6–5.1)
ALKALINE PHOSPHATASE (APISO): 63 U/L (ref 33–130)
ALT: 19 U/L (ref 6–29)
AST: 14 U/L (ref 10–35)
BILIRUBIN TOTAL: 0.5 mg/dL (ref 0.2–1.2)
BUN / CREAT RATIO: 11 (calc) (ref 6–22)
BUN: 10 mg/dL (ref 7–25)
CO2: 29 mmol/L (ref 20–32)
CREATININE: 0.95 mg/dL — AB (ref 0.60–0.88)
Calcium: 8.8 mg/dL (ref 8.6–10.4)
Chloride: 106 mmol/L (ref 98–110)
GFR, EST AFRICAN AMERICAN: 62 mL/min/{1.73_m2} (ref >=60)
GFR, Est Non African American: 53 mL/min/{1.73_m2} — ABNORMAL LOW (ref >=60)
GLOBULIN: 2.6 g/dL (ref 1.9–3.7)
Glucose, Bld: 94 mg/dL (ref 65–99)
Potassium: 3.8 mmol/L (ref 3.5–5.3)
SODIUM: 146 mmol/L (ref 135–146)
TOTAL PROTEIN: 5.8 g/dL — AB (ref 6.1–8.1)

## 2017-10-20 LAB — CBC WITH DIFFERENTIAL/PLATELET
Basophils Absolute: 62 cells/uL (ref 0–200)
Basophils Relative: 0.7 %
Eosinophils Absolute: 169 cells/uL (ref 15–500)
Eosinophils Relative: 1.9 %
HCT: 33.1 % — ABNORMAL LOW (ref 35.0–45.0)
Hemoglobin: 11.1 g/dL — ABNORMAL LOW (ref 11.7–15.5)
Lymphs Abs: 2750 cells/uL (ref 850–3900)
MCH: 31.2 pg (ref 27.0–33.0)
MCHC: 33.5 g/dL (ref 32.0–36.0)
MCV: 93 fL (ref 80.0–100.0)
MPV: 10.5 fL (ref 7.5–12.5)
Monocytes Relative: 6.5 %
Neutro Abs: 5340 cells/uL (ref 1500–7800)
Neutrophils Relative %: 60 %
Platelets: 342 10*3/uL (ref 140–400)
RBC: 3.56 10*6/uL — ABNORMAL LOW (ref 3.80–5.10)
RDW: 12.3 % (ref 11.0–15.0)
Total Lymphocyte: 30.9 %
WBC mixed population: 579 cells/uL (ref 200–950)
WBC: 8.9 10*3/uL (ref 3.8–10.8)

## 2017-10-25 ENCOUNTER — Encounter: Payer: Medicare Other | Admitting: Internal Medicine

## 2017-11-09 DIAGNOSIS — N200 Calculus of kidney: Secondary | ICD-10-CM | POA: Diagnosis not present

## 2017-11-15 DIAGNOSIS — N200 Calculus of kidney: Secondary | ICD-10-CM | POA: Diagnosis not present

## 2017-11-30 DIAGNOSIS — N200 Calculus of kidney: Secondary | ICD-10-CM | POA: Diagnosis not present

## 2018-01-19 ENCOUNTER — Telehealth: Payer: Self-pay | Admitting: *Deleted

## 2018-01-19 NOTE — Telephone Encounter (Signed)
Patient's husband notified to give OTC tylenol for fever and to follow instructions on label. He said her last temp was 100.3 at 1100 but she seems to be tolerating PO liquids and small amounts of food okay. He will continue to monitor and go to ER if worsening.

## 2018-01-19 NOTE — Telephone Encounter (Signed)
Patient's husband called to say that patient is going to ER due to fever of 103.1.

## 2018-01-19 NOTE — Telephone Encounter (Signed)
Patient's husband came to clinic and advised that patient had nausea and a few episodes vomiting yesterday evening along with temperature up to 102.4 max. He said he gave her ASA and it came down. She has had no vomiting this AM but she still has a low grade temp of 100.2. I offered to schedule appt with Dinah in the clinic for eval. He declined and said she is able to keep liquids down and ate an orange with no problems this AM. He said he just wanted a not ein her record. He said if her temp spikes, he is going to take her to the ER due to PMHx of kidney stone with blockage. I advised in the meantime to focus on liquids for hydration. Her verbalized understanding.

## 2018-01-20 ENCOUNTER — Non-Acute Institutional Stay: Payer: Medicare Other | Admitting: Family

## 2018-01-20 ENCOUNTER — Encounter: Payer: Self-pay | Admitting: Family

## 2018-01-20 ENCOUNTER — Telehealth: Payer: Self-pay | Admitting: Family

## 2018-01-20 ENCOUNTER — Ambulatory Visit
Admission: RE | Admit: 2018-01-20 | Discharge: 2018-01-20 | Disposition: A | Discharge status: Home / Self Care | Payer: Medicare Other | Source: Ambulatory Visit | Attending: Family | Admitting: Family

## 2018-01-20 ENCOUNTER — Other Ambulatory Visit: Payer: Self-pay | Admitting: Family

## 2018-01-20 VITALS — BP 116/60 | HR 100 | Temp 98.1°F | Resp 16 | Ht 63.0 in | Wt 132.6 lb

## 2018-01-20 DIAGNOSIS — R05 Cough: Secondary | ICD-10-CM

## 2018-01-20 DIAGNOSIS — R630 Anorexia: Secondary | ICD-10-CM | POA: Diagnosis not present

## 2018-01-20 DIAGNOSIS — R058 Other specified cough: Secondary | ICD-10-CM

## 2018-01-20 DIAGNOSIS — R509 Fever, unspecified: Secondary | ICD-10-CM

## 2018-01-20 DIAGNOSIS — R0989 Other specified symptoms and signs involving the circulatory and respiratory systems: Secondary | ICD-10-CM | POA: Diagnosis not present

## 2018-01-20 LAB — UA/M W/RFLX CULTURE, ROUTINE
Bilirubin (Urine): NEGATIVE
Glucose: NEGATIVE
Nitrite, UA: NEGATIVE — AB
SPECIFIC GRAVITY: 1.024
pH: 6

## 2018-01-20 LAB — COMPLETE METABOLIC PANEL WITH GFR
ALBUMIN: 3.6
ALT: 9
AST: 13
Alkaline Phosphatase: 59
BILIRUBIN TOTAL: 0.6
BUN: 25 — AB (ref 4–21)
CALCIUM: 8.7
Creat: 1.22
GLUCOSE: 139
Potassium: 4.1
Sodium: 141
TOTAL PROTEIN: 6.1 g/dL

## 2018-01-20 NOTE — Progress Notes (Addendum)
Location:  [facility] of Service:  Clinic (12) Provider: Render Marley FNP-C  Blanchie Serve, MD  Patient Care Team: Blanchie Serve, MD as PCP - General (Internal Medicine) Tre Sanker, Nelda Bucks, NP as Nurse Practitioner (Family Medicine)  Extended Emergency Contact Information Primary Emergency Contact: Gordan,[place] Address: [address]          [place], [place] [address] Johnnette Litter of [place] Phone: [phone] Relation: Daughter Secondary Emergency Contact: Verneda Skill Address: [address]          [place], [place] [address] [place] of [place] Phone: [phone] Relation: Spouse  Code Status:  DNR Goals of care: Advanced Directive information Advanced Directives 01/20/2018  Does Patient Have a Medical Advance Directive? Yes  Type of Paramedic of [place];Living will  Does patient want to make changes to medical advance directive? -  Copy of [place] in Chart? -  Pre-existing out of facility DNR order (yellow form or pink MOST form) -     Chief Complaint  Patient presents with  . Acute Visit    fever    HPI:  Pt is a 84 y.o. female seen today at [facility] for an acute visit for evaluation of fever.she is seen in the clinic today escorted by Husband.Patient's husband states patient has been running high fever 100.0 to 103 ( patient's log reviewed brought by husband to visit ) since Sunday 01/15/2018.He states patient refused to go to ER.she has taken Asprin and Tylenol  for fever.He states tylenol seems to work but fever returns.He also states patient has had an occasional non-productive cough.no wheezing or shortness of breath noted.patient has also had a decreased appetite.Per Husband patient will eats oranges and drink juice but not solid food.she had an episode of diarrhea and vomiting on Wednesday( 2 days ago) but resolved. Patient states " I'm fine there's nothing wrong with me". Husband  worried that she might be having another episode of kidney stone which she had in the past resulting to sepsis and  Hospitalization. He states wife did not have any pain except fever. Patient's Temp was 101.6 at 8 Am prior to visit and had Tylenol.Temp down to 98.1 during visit.HPI and ROS limited due to patient cognitive impairment much of the information provided by patient's husband.       Past Medical History:  Diagnosis Date  . Allergic rhinitis   . Anxiety   . Balance problem 04/28/2015  . Fatigue 04/28/2015  . Hearing loss 04/28/2015  . history of Fracture of right humerus 04/28/2015  . Hyperglycemia   . Hyperlipidemia   . Hypertension   . Memory loss 04/28/2015   04/26/2014 MMSE 28/30. Failed clock drawing. 04/21/15 MMSE 21/30. Failed clock drawing.   . Osteoporosis    Past Surgical History:  Procedure Laterality Date  . ABDOMINAL HYSTERECTOMY    . CATARACT EXTRACTION  2010  . ELBOW SURGERY Right 1988  . FEMUR FRACTURE SURGERY Right 06/2014   [place], [place] Dr. Dorita Fray  . HUMERUS FRACTURE SURGERY Right 2015   [place], [place] Dr. Noemi Chapel  . TONSILLECTOMY  1935    Allergies  Allergen Reactions  . Ace Inhibitors   . Almond Oil   . Plum Pulp     Outpatient Encounter Medications as of 01/20/2018  Medication Sig  . aspirin 325 MG EC tablet Take 325 mg as needed by mouth.   . calcium carbonate (OS-CAL) 600 MG TABS tablet Take 1  tablet (600 mg total) by mouth 2 (two) times daily with a meal.  . Cholecalciferol (VITAMIN D3) 1000 UNITS CAPS Take 1,000 Units daily by mouth.   . diphenhydrAMINE (BENADRYL) 25 MG tablet Take 25 mg by mouth every 6 (six) hours as needed for itching or allergies.  Marland Kitchen donepezil (ARICEPT) 10 MG tablet TAKE ONE TABLET BY MOUTH DAILY TO PRESERVE MEMORY  . vitamin B-12 (CYANOCOBALAMIN) 1000 MCG tablet Take 1,000 mcg by mouth daily.   No facility-administered encounter medications on file as of 01/20/2018.     Review of Systems  Unable to perform  ROS: Dementia ( much of the information provided by patient's husband.)  Constitutional: Positive for appetite change and fever. Negative for activity change, chills and fatigue.  HENT: Negative for congestion, rhinorrhea, sinus pressure, sinus pain, sneezing, sore throat and trouble swallowing.   Eyes: Negative for discharge, redness, itching and visual disturbance.  Respiratory: Positive for cough. Negative for chest tightness, shortness of breath and wheezing.   Cardiovascular: Negative for chest pain, palpitations and leg swelling.  Gastrointestinal: Negative for abdominal distention, abdominal pain, constipation, diarrhea, nausea and vomiting.  Endocrine: Negative for cold intolerance and heat intolerance.  Genitourinary: Negative for dysuria, flank pain, frequency and urgency.  Musculoskeletal: Positive for gait problem. Negative for arthralgias.  Skin: Negative for color change, pallor and rash.  Neurological: Negative for dizziness, syncope, weakness, light-headedness and headaches.  Psychiatric/Behavioral: Negative for agitation, confusion and sleep disturbance. The patient is not nervous/anxious.     Immunization History  Administered Date(s) Administered  . DTaP 05/12/2014  . Influenza, High Dose Seasonal PF 09/21/2017  . Influenza-Unspecified 10/07/2014, 09/11/2015, 09/23/2016  . PPD Test 10/16/2014  . Pneumococcal Conjugate-13 10/20/2017  . Pneumococcal Polysaccharide-23 08/20/2011   Pertinent  Health Maintenance Due  Topic Date Due  . DEXA SCAN  03/21/1993  . INFLUENZA VACCINE  Completed  . PNA vac Low Risk Adult  Completed   Fall Risk  10/14/2017 02/17/2016 01/06/2015  Falls in the past year? No No Yes  Comment - - fx (R) humerus; fx (R) femur  Number falls in past yr: - - 2 or more  Risk for fall due to : - - History of fall(s)    Vitals:   01/20/18 0937  BP: 116/60  Pulse: 100  Resp: 16  Temp: 98.1 F (36.7 C)  TempSrc: Oral  SpO2: 94%  Weight: 132 lb 9.6 oz  (60.1 kg)  Height: '5\' 3"'  (1.6 m)   Body mass index is 23.49 kg/m. Physical Exam  Constitutional: She is oriented to person, place, and time. She appears well-developed and well-nourished. No distress.  Elderly in no acute distress laughing and making jokes during visit   HENT:  Head: Normocephalic and atraumatic.  Left Ear: External ear normal.  Mouth/Throat: Oropharynx is clear and moist. No oropharyngeal exudate.  Right ear cerumen impaction no treatment desired for now wants to wait for fever to resolve.   Eyes: Conjunctivae and EOM are normal. Pupils are equal, round, and reactive to light. Right eye exhibits no discharge. Left eye exhibits no discharge. No scleral icterus.  Neck: Normal range of motion. Neck supple. No JVD present. No thyromegaly present.  Cardiovascular: Normal rate, regular rhythm, normal heart sounds and intact distal pulses. Exam reveals no gallop and no friction rub.  No murmur heard. Pulmonary/Chest: Effort normal. No respiratory distress. She has no wheezes. She has no rales.  Diminished lung bases  Abdominal: Soft. Bowel sounds are normal. There is no  tenderness. There is no rebound and no guarding.  Genitourinary:  Genitourinary Comments: Continent   Musculoskeletal: Normal range of motion. She exhibits no edema or tenderness.  Unsteady gait uses Rolator   Lymphadenopathy:    She has no cervical adenopathy.  Neurological: She is oriented to person, place, and time. Coordination normal.  Skin: Skin is warm and dry. No rash noted. She is not diaphoretic.  Psychiatric: She has a normal mood and affect.    Labs reviewed: Recent Labs    10/10/17 0528 10/11/17 0518 10/12/17 0624 10/20/17 0750  NA 141  --  141 146  K 3.4* 3.7 3.2* 3.8  CL 110  --  108 106  CO2 23  --  24 29  GLUCOSE 114*  --  119* 94  BUN 24*  --  14 10  CREATININE 1.38* 1.31* 0.89 0.95*  CALCIUM 8.1*  --  8.4* 8.8   Recent Labs    02/16/17 0001 09/14/17 0000 10/09/17 1555  10/20/17 0750  AST 14 13 45* 14  ALT '11 10 26 19  ' ALKPHOS 55  --  56  --   BILITOT 0.6 0.4 2.0* 0.5  PROT 6.3 6.5 6.8 5.8*  ALBUMIN 4.1  --  3.5  --    Recent Labs    10/09/17 1555  10/11/17 1209 10/12/17 0624 10/20/17 0750  WBC 12.5*   < > 13.1* 10.1 8.9  NEUTROABS 12.0*  --  11.2*  --  5,340  HGB 13.0   < > 12.3 11.7* 11.1*  HCT 39.0   < > 37.8 35.3* 33.1*  MCV 96.8   < > 96.7 95.4 93.0  PLT 202   < > 151 148* 342   < > = values in this interval not displayed.   Lab Results  Component Value Date   TSH 2.18 09/14/2017   Lab Results  Component Value Date   HGBA1C 5.8 04/17/2015   Lab Results  Component Value Date   CHOL 246 (H) 09/14/2017   HDL 55 09/14/2017   LDLCALC 184 (H) 02/16/2017   TRIG 121 09/14/2017   CHOLHDL 4.5 09/14/2017    Significant Diagnostic Results in last 30 days:  No results found.  Assessment/Plan 1. Fever, unspecified fever cause Has had fever x 5 days since Sunday.Tylenol and ASA.Refused to go to ER for evaluation for Temp 103 01/19/2018.Temp prior to visit 101.6 had tylenol now down to 98.1.Non productive cough reported. No signs of urinary tract infections.Exam finding negative except bilateral diminished lung bases. Will obtain stat lab work: - CBC with Differential/Platelets; stat - CMP with eGFR([facility]); stat - DG Chest 2 View; Future rule out pneumonia.  - Culture, Urine; Future - Urinalysis with Reflex Microscopic; Future Encourage hydration.  2. Decreased appetite Refuses solid food.Encourage hydration.Send to ER if not taking oral intake. Though she refused to go to ER with high temp. Check labs:  - CMP with eGFR(Quest); Future  3. Non-productive cough occasional non-productive cough with fever.on exam has diminished lung bases.  - CBC with Differential/Platelets stat. - DG Chest 2 View;rule out pneumonia.   Family/ staff Communication: Reviewed plan of care with patient and patient's Husband.Send to ER if symptoms worsen or  not resolved.   Labs/tests ordered: Stat CBC/diff,CMP.Chest X-ray Pa/Lat   Addendum: Portable chest X-ray results showed no acute abnormalities 01/20/2018 Urine Analysis showed Turbid urine color,trace Ketones,3+ occult blood,3+ leukocyte and many bacteria. Urine culture showed > 100,000 colonies of E.Coli.  Start Nitrofurantoin 100 mg capsule one  by mouth twice daily x 7 days and Florastor 250 mg capsule on eby mouth twice daily x 10 days for antibiotics associated diarrhea prevention.   Sandrea Hughs, NP

## 2018-01-20 NOTE — Telephone Encounter (Signed)
Mr.Don Marylu LundGarrigan returned phone call.Notified patient and spouse of chest X-ray results.very impressed how fast the results came back since they just returned from imaging place.

## 2018-01-20 NOTE — Telephone Encounter (Signed)
CXR done 01/20/2018  results showed no acute cardiopulmonary abnormalities.results called to patient's Spouse Carroll KindsDon Henrichs at [phone] but no answer.Voicemail left to call provider.

## 2018-01-20 NOTE — Telephone Encounter (Signed)
Spoke with patient's husband this AM. Patient refused to go to ER yesterday. He said she was still running a temp of around 101 this AM with tylenol. I offered appt in the clinic this AM and he said he would check with her and call me back.

## 2018-01-20 NOTE — Patient Instructions (Signed)
Increase fluid oral intake Notify provider's office if symptoms worsen or not resolved Monitor Temperature and take tylenol as directed.

## 2018-01-23 ENCOUNTER — Telehealth: Payer: Self-pay | Admitting: Family

## 2018-01-23 ENCOUNTER — Encounter: Payer: Self-pay | Admitting: *Deleted

## 2018-01-23 LAB — URINALYSIS W MICROSCOPIC + REFLEX CULTURE
Bilirubin Urine: NEGATIVE
GLUCOSE, UA: NEGATIVE
Nitrites, Initial: NEGATIVE
PH: 6 (ref 5.0–8.0)
Specific Gravity, Urine: 1.024 (ref 1.001–1.03)

## 2018-01-23 LAB — CBC WITH DIFFERENTIAL/PLATELET

## 2018-01-23 LAB — URINE CULTURE
MICRO NUMBER:: 90177902
SPECIMEN QUALITY:: ADEQUATE

## 2018-01-23 LAB — COMPLETE METABOLIC PANEL WITH GFR
AG Ratio: 1.4 (calc) (ref 1.0–2.5)
ALBUMIN MSPROF: 3.6 g/dL (ref 3.6–5.1)
ALT: 9 U/L (ref 6–29)
AST: 13 U/L (ref 10–35)
Alkaline phosphatase (APISO): 59 U/L (ref 33–130)
BUN / CREAT RATIO: 20 (calc) (ref 6–22)
BUN: 25 mg/dL (ref 7–25)
CALCIUM: 8.7 mg/dL (ref 8.6–10.4)
CO2: 20 mmol/L (ref 20–32)
CREATININE: 1.22 mg/dL — AB (ref 0.60–0.88)
Chloride: 107 mmol/L (ref 98–110)
GFR, EST NON AFRICAN AMERICAN: 39 mL/min/{1.73_m2} — AB (ref >=60)
GFR, Est African American: 45 mL/min/{1.73_m2} — ABNORMAL LOW (ref >=60)
GLOBULIN: 2.5 g/dL (ref 1.9–3.7)
GLUCOSE: 139 mg/dL — AB (ref 65–99)
Potassium: 4.1 mmol/L (ref 3.5–5.3)
Sodium: 141 mmol/L (ref 135–146)
Total Bilirubin: 0.6 mg/dL (ref 0.2–1.2)
Total Protein: 6.1 g/dL (ref 6.1–8.1)

## 2018-01-23 LAB — CULTURE INDICATED

## 2018-01-23 LAB — SPECIMEN COMPROMISED

## 2018-01-23 MED ORDER — SACCHAROMYCES BOULARDII 250 MG PO CAPS: 250.0000 mg | ORAL_CAPSULE | Freq: Two times a day (BID) | ORAL | 0 refills | Status: DC

## 2018-01-23 MED ORDER — NITROFURANTOIN MACROCRYSTAL 100 MG PO CAPS: 100.0000 mg | ORAL_CAPSULE | Freq: Two times a day (BID) | ORAL | 0 refills | Status: DC

## 2018-01-23 NOTE — Addendum Note (Signed)
Addended by: Josph MachoANCE, Kashia Brossard A on: 01/23/2018 11:03 AM   Modules accepted: Orders

## 2018-01-23 NOTE — Telephone Encounter (Signed)
Patient notified and Rx's sent to pharmacy.  

## 2018-01-23 NOTE — Telephone Encounter (Signed)
Please Notify patient urine results positive for UTI.Start  Nitrofurantoin 100 mg capsule one by mouth twice daily x 7 days and Florastor 250 mg capsule on eby mouth twice daily x 10 days for antibiotics associated diarrhea prevention. Encourage fluid intake.

## 2018-02-02 ENCOUNTER — Other Ambulatory Visit: Payer: Self-pay | Admitting: *Deleted

## 2018-02-02 DIAGNOSIS — R739 Hyperglycemia, unspecified: Secondary | ICD-10-CM

## 2018-02-13 ENCOUNTER — Other Ambulatory Visit: Payer: Self-pay

## 2018-02-13 DIAGNOSIS — R739 Hyperglycemia, unspecified: Secondary | ICD-10-CM | POA: Diagnosis not present

## 2018-02-14 LAB — HEMOGLOBIN A1C
Hgb A1c MFr Bld: 6.2 % of total Hgb — ABNORMAL HIGH (ref <5.7)
Mean Plasma Glucose: 131 (calc)
eAG (mmol/L): 7.3 (calc)

## 2018-02-15 ENCOUNTER — Encounter: Payer: Self-pay | Admitting: Internal Medicine

## 2018-02-15 ENCOUNTER — Non-Acute Institutional Stay: Payer: Medicare Other | Admitting: Internal Medicine

## 2018-02-15 VITALS — BP 118/68 | HR 85 | Temp 97.5°F | Resp 16 | Ht 63.0 in | Wt 127.4 lb

## 2018-02-15 DIAGNOSIS — R2689 Other abnormalities of gait and mobility: Secondary | ICD-10-CM

## 2018-02-15 DIAGNOSIS — R7303 Prediabetes: Secondary | ICD-10-CM

## 2018-02-15 DIAGNOSIS — M810 Age-related osteoporosis without current pathological fracture: Secondary | ICD-10-CM | POA: Diagnosis not present

## 2018-02-15 DIAGNOSIS — I10 Essential (primary) hypertension: Secondary | ICD-10-CM | POA: Diagnosis not present

## 2018-02-15 DIAGNOSIS — R413 Other amnesia: Secondary | ICD-10-CM | POA: Diagnosis not present

## 2018-02-15 DIAGNOSIS — E7849 Other hyperlipidemia: Secondary | ICD-10-CM | POA: Diagnosis not present

## 2018-02-15 MED ORDER — MEMANTINE HCL 5 MG PO TABS: ORAL_TABLET | ORAL | 0 refills | Status: DC

## 2018-02-15 NOTE — Patient Instructions (Signed)
  You will need a CT scan of your head to evaluate further on memory loss. You will receive a call from our office regarding the appointment.

## 2018-02-15 NOTE — Progress Notes (Signed)
[facility]  Provider: Oneal Grout MD   Location:  [facility]   Place of Service:  Clinic (12)  PCP: Oneal Grout, MD Patient Care Team: Oneal Grout, MD as PCP - General (Internal Medicine) Ngetich, Donalee Citrin, NP as Nurse Practitioner (Family Medicine)  Extended Emergency Contact Information Primary Emergency Contact: Gordan,[place] Address: [address]          [place], [place] [address] Darden Amber of [place] Home Phone: [phone] Relation: Daughter Secondary Emergency Contact: Iven Finn Address: [address]          [place], [place] [address] Darden Amber of [place] Home Phone: [phone] Relation: Spouse   Goals of Care: Advanced Directive information Advanced Directives 01/20/2018  Does Patient Have a Medical Advance Directive? Yes  Type of Estate agent of [place];Living will  Does patient want to make changes to medical advance directive? -  Copy of Healthcare Power of Attorney in Chart? -  Pre-existing out of facility DNR order (yellow form or pink MOST form) -      Chief Complaint  Patient presents with  . Medical Management of Chronic Issues    routine visit  . Medication Refill    No refills needed at this time    HPI: Patient is a 84 y.o. female seen today for routine visit. On chart review, lost 5 lbs in last 1 month. Here with her husband. Husband is concerned about her declining memory. He mentions that she has been refusing shower and has poor personal hygiene now.    Dementia- currently on donepezil 10 mg daily. Has lost 5 lbs in last 1 month on chart review. Tends to get upset easily with her husband.   b12 deficiency- currently on b12 supplement. Tolerating it well.   OA- overall controlled. Currently on oscal and vitamin D  Hyperlipidemia- not on any lipid lowering agent.   Past Medical History:  Diagnosis Date  . Allergic rhinitis   . Anxiety   . Balance problem 04/28/2015  .  Fatigue 04/28/2015  . Hearing loss 04/28/2015  . history of Fracture of right humerus 04/28/2015  . Hyperglycemia   . Hyperlipidemia   . Hypertension   . Memory loss 04/28/2015   04/26/2014 MMSE 28/30. Failed clock drawing. 04/21/15 MMSE 21/30. Failed clock drawing.   . Osteoporosis    Past Surgical History:  Procedure Laterality Date  . ABDOMINAL HYSTERECTOMY    . CATARACT EXTRACTION  2010  . ELBOW SURGERY Right 1988  . FEMUR FRACTURE SURGERY Right 06/2014   [place], [place] Dr. Dione Housekeeper  . HUMERUS FRACTURE SURGERY Right 2015   [place], [place] Dr. Eber Hong  . TONSILLECTOMY  1935    reports that  has never smoked. she has never used smokeless tobacco. She reports that she drinks about 0.6 - 1.2 oz of alcohol per week. She reports that she does not use drugs. Social History   Socioeconomic History  . Marital status: Unknown    Spouse name: Not on file  . Number of children: Not on file  . Years of education: Not on file  . Highest education level: Not on file  Social Needs  . Financial resource strain: Not on file  . Food insecurity - worry: Not on file  . Food insecurity - inability: Not on file  . Transportation needs - medical: Not on file  . Transportation needs - non-medical: Not on file  Occupational History  . Occupation: Housewife  Tobacco Use  .  Smoking status: Never Smoker  . Smokeless tobacco: Never Used  Substance and Sexual Activity  . Alcohol use: Yes    Alcohol/week: 0.6 - 1.2 oz    Types: 1 - 2 Glasses of wine per week    Comment: 1-2 glasses 1-2 times a week  . Drug use: No  . Sexual activity: Not on file  Other Topics Concern  . Not on file  Social History Narrative   Lives at [facility] since 11/28/14   Married [name]   Never smoked   Alcohol -wine 1-2 daily   Caffeine coffee 2 daily   Exercise walking   Walks with walker   Living Will, POA, DNR    Functional Status Survey:    Family History  Problem Relation Age of Onset  .  Heart disease Mother   . Diabetes Daughter     Health Maintenance  Topic Date Due  . DEXA SCAN  03/21/1993  . TETANUS/TDAP  12/14/2023  . INFLUENZA VACCINE  Completed  . PNA vac Low Risk Adult  Completed    Allergies  Allergen Reactions  . Ace Inhibitors   . Almond Oil   . Plum Pulp     Outpatient Encounter Medications as of 02/15/2018  Medication Sig  . Cholecalciferol (VITAMIN D3) 1000 UNITS CAPS Take 1,000 Units daily by mouth.   . diphenhydrAMINE (BENADRYL) 25 MG tablet Take 25 mg by mouth every 6 (six) hours as needed for itching or allergies.  Marland Kitchen donepezil (ARICEPT) 10 MG tablet TAKE ONE TABLET BY MOUTH DAILY TO PRESERVE MEMORY  . vitamin B-12 (CYANOCOBALAMIN) 1000 MCG tablet Take 1,000 mcg by mouth daily.  Marland Kitchen aspirin 325 MG EC tablet Take 325 mg as needed by mouth.   . calcium carbonate (OS-CAL) 600 MG TABS tablet Take 1 tablet (600 mg total) by mouth 2 (two) times daily with a meal. (Patient not taking: Reported on 02/15/2018)  . memantine (NAMENDA) 5 MG tablet Take 5 mg (1 tablet)  Daily for 1 week, then 5 mg twice a day for 1 week, then 10 mg ( 2 tablets) daily for 1 week and then 10 mg ( 2 tablets) twice a day.  . [DISCONTINUED] nitrofurantoin (MACRODANTIN) 100 MG capsule Take 1 capsule (100 mg total) by mouth 2 (two) times daily.  . [DISCONTINUED] saccharomyces boulardii (FLORASTOR) 250 MG capsule Take 1 capsule (250 mg total) by mouth 2 (two) times daily.   No facility-administered encounter medications on file as of 02/15/2018.     Review of Systems  Constitutional: Negative for appetite change, chills and fever.  HENT: Negative for mouth sores and trouble swallowing.   Respiratory: Negative for cough, shortness of breath and wheezing.   Cardiovascular: Negative for chest pain and palpitations.  Gastrointestinal: Negative for abdominal pain, blood in stool, constipation, diarrhea and vomiting.  Genitourinary: Positive for frequency. Negative for dysuria and hematuria.    Musculoskeletal: Positive for gait problem. Negative for back pain.       Uses a walker for ambulation   Skin: Negative for rash and wound.  Neurological: Negative for dizziness, numbness and headaches.  Psychiatric/Behavioral: Positive for behavioral problems and confusion. Negative for self-injury and suicidal ideas.    Vitals:   02/15/18 0929  BP: 118/68  Pulse: 85  Resp: 16  Temp: (!) 97.5 F (36.4 C)  TempSrc: Oral  SpO2: 97%  Weight: 127 lb 6.4 oz (57.8 kg)  Height: 5\' 3"  (1.6 m)   Body mass index is 22.57 kg/m.  Wt Readings from Last 3 Encounters:  02/15/18 127 lb 6.4 oz (57.8 kg)  01/20/18 132 lb 9.6 oz (60.1 kg)  10/17/17 137 lb 6.4 oz (62.3 kg)   MMSE - Mini Mental State Exam 02/15/2018 09/07/2017 09/07/2017  Not completed: (No Data) - (No Data)  Orientation to time 5 4 4   Orientation to Place 5 4 4   Registration 2 3 3   Attention/ Calculation 2 4 4   Recall 0 2 2  Language- name 2 objects 2 2 2   Language- repeat 1 1 1   Language- follow 3 step command 3 3 3   Language- read & follow direction 1 1 1   Write a sentence 1 1 1   Copy design 0 1 1  Total score 22 26 26    Physical Exam  Constitutional:  Thin built, frail, elderly female in no acute distress  HENT:  Head: Normocephalic and atraumatic.  Mouth/Throat: Oropharynx is clear and moist. No oropharyngeal exudate.  Eyes: Conjunctivae and EOM are normal. Pupils are equal, round, and reactive to light. Right eye exhibits no discharge. Left eye exhibits no discharge.  Neck: Normal range of motion. Neck supple.  Cardiovascular: Normal rate and regular rhythm.  Pulmonary/Chest: Effort normal and breath sounds normal. She has no wheezes. She has no rales.  Abdominal: Soft. Bowel sounds are normal. There is no tenderness. There is no rebound.  Musculoskeletal: She exhibits no edema.  Unsteady gait, walker  Lymphadenopathy:    She has no cervical adenopathy.  Neurological: She is alert.  Oriented to place, time  and person but needs redirection, is repetitive and somewhat upset today  02/15/18 MMSE 23/30, failed clock draw  Skin: Skin is warm and dry. She is not diaphoretic.  Psychiatric:  Somewhat anxious and upset today    Labs reviewed: Basic Metabolic Panel: Recent Labs    10/12/17 0624 10/20/17 0750 01/20/18 01/20/18 1345  NA 141 146 141 141  K 3.2* 3.8 4.1 4.1  CL 108 106  --  107  CO2 24 29  --  20  GLUCOSE 119* 94  --  139*  BUN 14 10 25* 25  CREATININE 0.89 0.95* 1.22 1.22*  CALCIUM 8.4* 8.8 8.7 8.7   Liver Function Tests: Recent Labs    02/16/17 0001  10/09/17 1555 10/20/17 0750 01/20/18 01/20/18 1345  AST 14   < > 45* 14 13 13   ALT 11   < > 26 19 9 9   ALKPHOS 55  --  56  --  59  --   BILITOT 0.6   < > 2.0* 0.5 0.6 0.6  PROT 6.3   < > 6.8 5.8* 6.1 6.1  ALBUMIN 4.1  --  3.5  --  3.6  --    < > = values in this interval not displayed.   Recent Labs    10/09/17 1555  LIPASE 19   No results for input(s): AMMONIA in the last 8760 hours. CBC: Recent Labs    10/09/17 1555  10/11/17 1209 10/12/17 0624 10/20/17 0750 01/20/18 1345  WBC 12.5*   < > 13.1* 10.1 8.9 CANCELED  NEUTROABS 12.0*  --  11.2*  --  5,340  --   HGB 13.0   < > 12.3 11.7* 11.1*  --   HCT 39.0   < > 37.8 35.3* 33.1*  --   MCV 96.8   < > 96.7 95.4 93.0  --   PLT 202   < > 151 148* 342  --    < > =  values in this interval not displayed.   Cardiac Enzymes: No results for input(s): CKTOTAL, CKMB, CKMBINDEX, TROPONINI in the last 8760 hours. BNP: Invalid input(s): POCBNP Lab Results  Component Value Date   HGBA1C 6.2 (H) 02/13/2018   Lab Results  Component Value Date   TSH 2.18 09/14/2017   No results found for: VITAMINB12 No results found for: FOLATE No results found for: IRON, TIBC, FERRITIN  Lipid Panel: Recent Labs    02/16/17 0001 09/14/17 0000  CHOL 272* 246*  HDL 53 55  LDLCALC 184*  --   TRIG 174* 121  CHOLHDL 5.1* 4.5   Lab Results  Component Value Date   HGBA1C 6.2  (H) 02/13/2018    Procedures since last visit: Dg Chest 2 View  Result Date: 01/20/2018 CLINICAL DATA:  Cough and congestion for several days EXAM: CHEST  2 VIEW COMPARISON:  10/09/2017 FINDINGS: The heart size and mediastinal contours are within normal limits. Both lungs are clear. The visualized skeletal structures are unremarkable. IMPRESSION: No active cardiopulmonary disease. Electronically Signed   By: Alcide Clever M.D.   On: 01/20/2018 13:18    Assessment/Plan  1. Other hyperlipidemia Elevated cholesterol. Husband does not want any medication at present. With her advanced age and dementia, would defer statin for now  2. Memory loss MMSE 23/30, failed clock draw. Worsening of memory loss. Currently on donepezil. Add memantine titration dosing for now. Reviewed TSH and B12. Will obtain CT head to assess further.  - memantine (NAMENDA) 5 MG tablet; Take 5 mg (1 tablet)  Daily for 1 week, then 5 mg twice a day for 1 week, then 10 mg ( 2 tablets) daily for 1 week and then 10 mg ( 2 tablets) twice a day.  Dispense: 90 tablet; Refill: 0 - CT Head Wo Contrast; Future  3. Essential hypertension Controlled. Off antihypertensive.  4. Osteoporosis without current pathological fracture, unspecified osteoporosis type Continue calcium and vitamin D  5. Balance problem Fall precautions. To use her walker.   6. Prediabetes Cut down on sweets if possible. Supportive care.    Labs/tests ordered:  As above  Next appointment: 4 weeks to follow on tolerance for namenda.   Communication: reviewed care plan with patient and her husband   Oneal Grout, MD Internal Medicine [facility] [address] [place], [place] [address] Cell Phone (Monday-Friday 8 am - 5 pm): [phone] On Call: [phone] and follow prompts after 5 pm and on weekends Office Phone: [phone] Office Fax: [phone]

## 2018-02-19 ENCOUNTER — Other Ambulatory Visit: Payer: Self-pay | Admitting: Internal Medicine

## 2018-02-19 DIAGNOSIS — R413 Other amnesia: Secondary | ICD-10-CM

## 2018-02-22 ENCOUNTER — Other Ambulatory Visit: Payer: Self-pay

## 2018-02-22 DIAGNOSIS — R413 Other amnesia: Secondary | ICD-10-CM

## 2018-02-22 MED ORDER — MEMANTINE HCL 5 MG PO TABS: ORAL_TABLET | ORAL | 0 refills | Status: DC

## 2018-02-28 ENCOUNTER — Ambulatory Visit
Admission: RE | Admit: 2018-02-28 | Discharge: 2018-02-28 | Disposition: A | Discharge status: Home / Self Care | Payer: Medicare Other | Source: Ambulatory Visit | Attending: Internal Medicine | Admitting: Internal Medicine

## 2018-02-28 DIAGNOSIS — R51 Headache: Secondary | ICD-10-CM | POA: Diagnosis not present

## 2018-02-28 DIAGNOSIS — R413 Other amnesia: Secondary | ICD-10-CM | POA: Diagnosis not present

## 2018-03-09 ENCOUNTER — Other Ambulatory Visit: Payer: Self-pay | Admitting: *Deleted

## 2018-03-09 ENCOUNTER — Telehealth: Payer: Self-pay | Admitting: *Deleted

## 2018-03-09 DIAGNOSIS — R413 Other amnesia: Secondary | ICD-10-CM

## 2018-03-09 MED ORDER — MEMANTINE HCL 5 MG PO TABS: ORAL_TABLET | ORAL | 0 refills | Status: DC

## 2018-03-09 NOTE — Telephone Encounter (Signed)
Spoke with pharmacy and gave them a clarification. Pharmacist read back to me what the instructions were.

## 2018-03-09 NOTE — Telephone Encounter (Signed)
[facility] Namenda Rx. Pharmacy did not receive. Refaxed.

## 2018-03-09 NOTE — Telephone Encounter (Signed)
Brad with [facility] [address] called needing a call back with Clarification for patient's Namenda directions. Stated past week 1 is confusing.  Please call #[phone]

## 2018-03-09 NOTE — Telephone Encounter (Signed)
  namenda instruction- 5 mg daily for 1 week, then 5 mg twice a day for 1 week, then 15 mg daily for 1 week followed by 10 mg twice a day.   Please call pharmacy and provide these instructions. thanks

## 2018-03-15 ENCOUNTER — Encounter: Payer: Self-pay | Admitting: Internal Medicine

## 2018-03-15 ENCOUNTER — Non-Acute Institutional Stay: Payer: Medicare Other | Admitting: Internal Medicine

## 2018-03-15 VITALS — BP 122/70 | HR 90 | Temp 97.7°F | Resp 16 | Ht 63.0 in | Wt 131.6 lb

## 2018-03-15 DIAGNOSIS — I10 Essential (primary) hypertension: Secondary | ICD-10-CM | POA: Diagnosis not present

## 2018-03-15 DIAGNOSIS — E7849 Other hyperlipidemia: Secondary | ICD-10-CM | POA: Diagnosis not present

## 2018-03-15 DIAGNOSIS — E538 Deficiency of other specified B group vitamins: Secondary | ICD-10-CM

## 2018-03-15 DIAGNOSIS — R413 Other amnesia: Secondary | ICD-10-CM

## 2018-03-15 NOTE — Progress Notes (Signed)
[facility]  Provider: Blanchie Serve MD   Location:  [place] of Service:  Clinic (12)  PCP: Blanchie Serve, MD Patient Care Team: Blanchie Serve, MD as PCP - General (Internal Medicine) Ngetich, Nelda Bucks, NP as Nurse Practitioner (Family Medicine)  Extended Emergency Contact Information Primary Emergency Contact: Gordan,[place] Address: [address]          [place], [place] [address] Johnnette Litter of [place] Phone: [phone] Relation: Daughter Secondary Emergency Contact: Verneda Skill Address: [address]          [place], [place] [address] Johnnette Litter of [place] Phone: [phone] Relation: Spouse   Goals of Care: Advanced Directive information Advanced Directives 01/20/2018  Does Patient Have a Medical Advance Directive? Yes  Type of Paramedic of [place];Living will  Does patient want to make changes to medical advance directive? -  Copy of [place] in Chart? -  Pre-existing out of facility DNR order (yellow form or pink MOST form) -      Chief Complaint  Patient presents with  . Acute Visit    follow up on memory loss    HPI: Patient is a 84 y.o. female seen today for acute visit for follow up on her memory. A month back she was seen for routine visit and was on donepezil 10 mg daily at that time. She had MMSE score of 23/30 and failed clock draw on 02/15/18. She was started on memantine that visit. CT head was ordered that visit and has resulted. She is seen in her room with her husband this visit. She has started taking namenda but only started it last week. Per husband, can get upset easily but otherwise no acute behavior changes. She takes her medications.   Past Medical History:  Diagnosis Date  . Allergic rhinitis   . Anxiety   . Balance problem 04/28/2015  . Fatigue 04/28/2015  . Hearing loss 04/28/2015  . history of Fracture of right humerus 04/28/2015  .  Hyperglycemia   . Hyperlipidemia   . Hypertension   . Memory loss 04/28/2015   04/26/2014 MMSE 28/30. Failed clock drawing. 04/21/15 MMSE 21/30. Failed clock drawing.   . Osteoporosis    Past Surgical History:  Procedure Laterality Date  . ABDOMINAL HYSTERECTOMY    . CATARACT EXTRACTION  2010  . ELBOW SURGERY Right 1988  . FEMUR FRACTURE SURGERY Right 06/2014   [place], [place] Dr. Dorita Fray  . HUMERUS FRACTURE SURGERY Right 2015   [place], [place] Dr. Noemi Chapel  . TONSILLECTOMY  1935    reports that she has never smoked. She has never used smokeless tobacco. She reports that she drinks about 0.6 - 1.2 oz of alcohol per week. She reports that she does not use drugs. Social History   Socioeconomic History  . Marital status: Unknown    Spouse name: Not on file  . Number of children: Not on file  . Years of education: Not on file  . Highest education level: Not on file  Occupational History  . Occupation: Housewife  Social Needs  . Financial resource strain: Not on file  . Food insecurity:    Worry: Not on file    Inability: Not on file  . Transportation needs:    Medical: Not on file    Non-medical: Not on file  Tobacco Use  . Smoking status: Never Smoker  . Smokeless tobacco: Never Used  Substance and  Sexual Activity  . Alcohol use: Yes    Alcohol/week: 0.6 - 1.2 oz    Types: 1 - 2 Glasses of wine per week    Comment: 1-2 glasses 1-2 times a week  . Drug use: No  . Sexual activity: Not on file  Lifestyle  . Physical activity:    Days per week: Not on file    Minutes per session: Not on file  . Stress: Not on file  Relationships  . Social connections:    Talks on phone: Not on file    Gets together: Not on file    Attends religious service: Not on file    Active member of club or organization: Not on file    Attends meetings of clubs or organizations: Not on file    Relationship status: Not on file  . Intimate partner violence:    Fear of current or ex  partner: Not on file    Emotionally abused: Not on file    Physically abused: Not on file    Forced sexual activity: Not on file  Other Topics Concern  . Not on file  Social History Narrative   Lives at [facility] since 11/28/14   Married Elenore Rota   Never smoked   Alcohol -wine 1-2 daily   Caffeine coffee 2 daily   Exercise walking   Walks with walker   Living Will, POA, DNR     Family History  Problem Relation Age of Onset  . Heart disease Mother   . Diabetes Daughter     Health Maintenance  Topic Date Due  . DEXA SCAN  03/21/1993  . INFLUENZA VACCINE  07/13/2018  . TETANUS/TDAP  12/14/2023  . PNA vac Low Risk Adult  Completed    Allergies  Allergen Reactions  . Ace Inhibitors   . Almond Oil   . Plum Pulp     Outpatient Encounter Medications as of 03/15/2018  Medication Sig  . calcium carbonate (OS-CAL) 600 MG TABS tablet Take 1 tablet (600 mg total) by mouth 2 (two) times daily with a meal.  . Cholecalciferol (VITAMIN D3) 1000 UNITS CAPS Take 1,000 Units daily by mouth.   . diphenhydrAMINE (BENADRYL) 25 MG tablet Take 25 mg by mouth every 6 (six) hours as needed for itching or allergies.  Marland Kitchen donepezil (ARICEPT) 10 MG tablet TAKE ONE TABLET BY MOUTH DAILY TO PRESERVE MEMORY  . memantine (NAMENDA) 5 MG tablet Take 1 tablet daily for 1 week, then 1tab twice a day for 1 week, then 2 tablets daily for 1 week and then 2 tablets twice a day.  . vitamin B-12 (CYANOCOBALAMIN) 1000 MCG tablet Take 1,000 mcg by mouth daily.  Marland Kitchen aspirin 325 MG EC tablet Take 325 mg as needed by mouth.    No facility-administered encounter medications on file as of 03/15/2018.     Review of Systems  Constitutional: Negative for appetite change, chills and fever.  Respiratory: Negative for shortness of breath.   Cardiovascular: Negative for chest pain.  Gastrointestinal: Negative for abdominal pain, diarrhea, nausea and vomiting.  Genitourinary: Negative for dysuria.  Musculoskeletal:        No fall reported  Neurological: Negative for dizziness and headaches.  Psychiatric/Behavioral: Positive for confusion and decreased concentration.    Vitals:   03/15/18 0937  BP: 122/70  Pulse: 90  Resp: 16  Temp: 97.7 F (36.5 C)  TempSrc: Oral  SpO2: 95%  Weight: 131 lb 9.6 oz (59.7 kg)  Height: 5'  3" (1.6 m)   Body mass index is 23.31 kg/m.   Wt Readings from Last 3 Encounters:  03/15/18 131 lb 9.6 oz (59.7 kg)  02/15/18 127 lb 6.4 oz (57.8 kg)  01/20/18 132 lb 9.6 oz (60.1 kg)   Physical Exam  Constitutional: She appears well-developed and well-nourished. No distress.  Weight back to normal range for her  HENT:  Head: Normocephalic and atraumatic.  Eyes: Conjunctivae are normal.  Neck: Neck supple.  Cardiovascular: Normal rate and regular rhythm.  Pulmonary/Chest: Effort normal and breath sounds normal.  Abdominal: Soft. Bowel sounds are normal.  Musculoskeletal: Normal range of motion.  Unsteady gait, uses walker  Neurological: She is alert.  Oriented to person, month and place this visit  Skin: She is not diaphoretic.    Labs reviewed: Basic Metabolic Panel: Recent Labs    10/12/17 0624 10/20/17 0750 01/20/18 01/20/18 1345  NA 141 146 141 141  K 3.2* 3.8 4.1 4.1  CL 108 106  --  107  CO2 24 29  --  20  GLUCOSE 119* 94  --  139*  BUN 14 10 25* 25  CREATININE 0.89 0.95* 1.22 1.22*  CALCIUM 8.4* 8.8 8.7 8.7   Liver Function Tests: Recent Labs    10/09/17 1555 10/20/17 0750 01/20/18 01/20/18 1345  AST 45* _0 ALT _1 ALKPHOS 56  --  59  --   BILITOT 2.0* 0.5 0.6 0.6  PROT 6.8 5.8* 6.1 6.1  ALBUMIN 3.5  --  3.6  --    Recent Labs    10/09/17 1555  LIPASE 19   No results for input(s): AMMONIA in the last 8760 hours. CBC: Recent Labs    10/09/17 1555  10/11/17 1209 10/12/17 0624 10/20/17 0750 01/20/18 1345  WBC 12.5*   < > 13.1* 10.1 8.9 CANCELED  NEUTROABS 12.0*  --  11.2*  --  5,340  --   HGB 13.0   < > 12.3 11.7*  11.1*  --   HCT 39.0   < > 37.8 35.3* 33.1*  --   MCV 96.8   < > 96.7 95.4 93.0  --   PLT 202   < > 151 148* 342  --    < > = values in this interval not displayed.   Cardiac Enzymes: No results for input(s): CKTOTAL, CKMB, CKMBINDEX, TROPONINI in the last 8760 hours. BNP: Invalid input(s): POCBNP Lab Results  Component Value Date   HGBA1C 6.2 (H) 02/13/2018   Lab Results  Component Value Date   TSH 2.18 09/14/2017   No results found for: VITAMINB12 No results found for: FOLATE No results found for: IRON, TIBC, FERRITIN  Lipid Panel: Recent Labs    09/14/17 0000  CHOL 246*  HDL 55  LDLCALC 166*  TRIG 121  CHOLHDL 4.5   Lab Results  Component Value Date   HGBA1C 6.2 (H) 02/13/2018    Procedures since last visit: Ct Head Wo Contrast  Result Date: 02/28/2018 CLINICAL DATA:  Memory loss hypertension headaches EXAM: CT HEAD WITHOUT CONTRAST TECHNIQUE: Contiguous axial images were obtained from the base of the skull through the vertex without intravenous contrast. COMPARISON:  None. FINDINGS: Brain: No acute territorial infarction, hemorrhage or intracranial is visualized. Mild small vessel ischemic changes of the white matter. Mild atrophy. Ventricles are nonenlarged. Vascular: No hyperdense vessels. Vertebral artery and carotid artery calcification Skull: Prominent hyperostosis.  No fracture Sinuses/Orbits: Mucosal thickening in the ethmoid and maxillary sinuses.  Probable retention cysts in the inferior left maxillary sinus. Other: None IMPRESSION: 1. No CT evidence for acute intracranial abnormality. 2. Atrophy and small vessel ischemic changes of the white matter. Electronically Signed   By: Donavan Foil M.D.   On: 02/28/2018 14:26    Assessment/Plan  1. Essential hypertension - CMP with eGFR([facility]); Future - CBC (no diff); Future  2. Other hyperlipidemia - Lipid Panel; Future  3. Memory loss Has cognitive impairment. Likely a vascular component present given CT  head finding, history of HTN and her symptoms. Continue donepezil and namenda. BP controlled. Check statin - CBC (no diff); Future  4. B12 deficiency - CBC (no diff); Future - Vitamin B12; Future    Labs/tests ordered:   Lab Orders     CMP with eGFR([facility])     CBC (no diff)     Lipid Panel     Vitamin B12   Next appointment: 4 month for physical, MMSE  Communication: reviewed care plan with patient and her husband    Blanchie Serve, MD Internal Medicine [place], [place] [address] Cell Phone (Monday-Friday 8 am - 5 pm): [phone] On Call: [phone] and follow prompts after 5 pm and on weekends Office Phone: [phone] Office Fax: [phone]

## 2018-03-27 ENCOUNTER — Telehealth: Payer: Self-pay | Admitting: *Deleted

## 2018-03-27 NOTE — Telephone Encounter (Signed)
Patient's husband walked in to clinic at 2:15 wanting patient to be seen for a fever and chills/shakes since 10 AM yesterday. She had a dose of ASA 325mg  yesterday at 10AM, then tylenol 650mg  at 5:20PM. Both have been repeated today with no change. Temp still ~101.5. No available appts today. Offered to make one for tomorrow or I could make one at the office for her today. He said it was "stupid" for them to have to drive to the office or wait another day to be [facility]. I advised that I could have RN from [facility] come to check on patient. He was very angry that she couldn't be seen today even after I explained that Dr. Glade LloydPandey was the only provider seeing patients and her schedule was unfortunately full. He left his note pad in the office when he left. He came back for it and said "thanks for nothing" as he walked out.

## 2018-03-28 ENCOUNTER — Other Ambulatory Visit: Payer: Self-pay | Admitting: Family

## 2018-03-28 ENCOUNTER — Non-Acute Institutional Stay: Payer: Medicare Other | Admitting: Family

## 2018-03-28 ENCOUNTER — Encounter: Payer: Self-pay | Admitting: Family

## 2018-03-28 VITALS — BP 144/80 | HR 104 | Temp 99.2°F | Resp 16 | Ht 63.0 in | Wt 129.6 lb

## 2018-03-28 DIAGNOSIS — R638 Other symptoms and signs concerning food and fluid intake: Secondary | ICD-10-CM | POA: Diagnosis not present

## 2018-03-28 DIAGNOSIS — R0989 Other specified symptoms and signs involving the circulatory and respiratory systems: Secondary | ICD-10-CM | POA: Diagnosis not present

## 2018-03-28 DIAGNOSIS — R509 Fever, unspecified: Secondary | ICD-10-CM | POA: Diagnosis not present

## 2018-03-28 NOTE — Telephone Encounter (Signed)
Please make sure patient is seen in the clinic today.

## 2018-03-28 NOTE — Telephone Encounter (Signed)
She is scheduled for 2:00PM today.

## 2018-03-28 NOTE — Progress Notes (Signed)
[facility]   Place of Service:  Clinic (12) Provider: Euretha Najarro FNP-C  Oneal GroutPandey, Mahima, MD  Patient Care Team: Oneal GroutPandey, Mahima, MD as PCP - General (Internal Medicine) Jet Armbrust, Donalee Citrininah C, NP as Nurse Practitioner (Family Medicine)  Extended Emergency Contact Information Primary Emergency Contact: Gordan,[place] Address: [address]          [place], [place] [address] [place] Home Phone: [phone] Relation: Daughter Secondary Emergency Contact: Iven FinnGarrigan,Donald Address: [address]          [place], [place] [address] [place] Home Phone: [phone] Relation: Spouse  Goals of care: Advanced Directive information Advanced Directives 01/20/2018  Does Patient Have a Medical Advance Directive? Yes  Type of Estate agentAdvance Directive Healthcare Power of [place];Living will  Does patient want to make changes to medical advance directive? -  Copy of Healthcare Power of Attorney in Chart? -  Pre-existing out of facility DNR order (yellow form or pink MOST form) -     Chief Complaint  Patient presents with  . Acute Visit    fever    HPI:  Pt is a 84 y.o. female [facility] for an acute visit for evaluation of fever x three days.Patient's Husband states fever was 101 on Sunday but gave patient some tylenol and Asprin.She also had Tylenol early this morning when Temp 100.0 but none this afternoon.Patient's states " There's nothing wrong with me I feel fine" she tells husband to keep off the conversation during visit.Husband states patient had chills on Sunday but again patient disputes.Temp during visit is 99.2.she denies nausea,vomiting,cough or symptoms of urinary tract infections.she does admit to having decreased oral intake.for the past several days.  Past Medical History:  Diagnosis Date  . Allergic rhinitis   . Anxiety   . Balance problem 04/28/2015  . Fatigue 04/28/2015  . Hearing loss 04/28/2015  . history of  Fracture of right humerus 04/28/2015  . Hyperglycemia   . Hyperlipidemia   . Hypertension   . Memory loss 04/28/2015   04/26/2014 MMSE 28/30. Failed clock drawing. 04/21/15 MMSE 21/30. Failed clock drawing.   . Osteoporosis    Past Surgical History:  Procedure Laterality Date  . ABDOMINAL HYSTERECTOMY    . CATARACT EXTRACTION  2010  . ELBOW SURGERY Right 1988  . FEMUR FRACTURE SURGERY Right 06/2014   [place], [place] Dr. Dione HousekeeperJohn Vanderhoof  . HUMERUS FRACTURE SURGERY Right 2015   [place] Dr. Eber HongBrian Miller  . TONSILLECTOMY  1935    Allergies  Allergen Reactions  . Ace Inhibitors   . Almond Oil   . Plum Pulp     Outpatient Encounter Medications as of 03/28/2018  Medication Sig  . aspirin 325 MG EC tablet Take 325 mg as needed by mouth.   . calcium carbonate (OS-CAL) 600 MG TABS tablet Take 1 tablet (600 mg total) by mouth 2 (two) times daily with a meal. (Patient taking differently: Take 600 mg by mouth daily with breakfast. )  . Cholecalciferol (VITAMIN D3) 1000 UNITS CAPS Take 1,000 Units daily by mouth.   . diphenhydrAMINE (BENADRYL) 25 MG tablet Take 25 mg by mouth every 6 (six) hours as needed for itching or allergies.  Marland Kitchen. donepezil (ARICEPT) 10 MG tablet TAKE ONE TABLET BY MOUTH DAILY TO PRESERVE MEMORY  . memantine (NAMENDA) 5 MG tablet Take 1 tablet daily for 1 week, then 1tab twice a day for 1 week, then 2 tablets daily for 1  week and then 2 tablets twice a day.  . vitamin B-12 (CYANOCOBALAMIN) 1000 MCG tablet Take 1,000 mcg by mouth daily.   No facility-administered encounter medications on file as of 03/28/2018.     Review of Systems  Unable to perform ROS: Dementia (additional information provided by patient's husband)  Constitutional: Positive for appetite change and fever. Negative for activity change, chills and fatigue.  HENT: Negative for congestion, postnasal drip, sinus pressure, sinus pain, sneezing and sore throat.        Has had a runny nose but uses Benadryl  due to allergies from pollen.  Eyes: Negative for pain, discharge, redness and itching.  Respiratory: Negative for cough, chest tightness, shortness of breath and wheezing.   Cardiovascular: Negative for chest pain, palpitations and leg swelling.  Gastrointestinal: Negative for abdominal distention, abdominal pain, constipation, diarrhea, nausea and vomiting.       LBM 03/27/2018  Genitourinary: Positive for frequency. Negative for dysuria, flank pain and urgency.  Musculoskeletal: Positive for gait problem.  Skin: Negative for color change, pallor and rash.  Neurological: Negative for dizziness, weakness, light-headedness and headaches.  Psychiatric/Behavioral: Negative for agitation, confusion and sleep disturbance. The patient is not nervous/anxious.     Immunization History  Administered Date(s) Administered  . DTaP 05/12/2014  . Influenza, High Dose Seasonal PF 09/21/2017  . Influenza-Unspecified 10/07/2014, 09/11/2015, 09/23/2016  . PPD Test 10/16/2014  . Pneumococcal Conjugate-13 10/20/2017  . Pneumococcal Polysaccharide-23 08/20/2011   Pertinent  Health Maintenance Due  Topic Date Due  . DEXA SCAN  03/21/1993  . INFLUENZA VACCINE  07/13/2018  . PNA vac Low Risk Adult  Completed   Fall Risk  10/14/2017 02/17/2016 01/06/2015  Falls in the past year? No No Yes  Comment - - fx (R) humerus; fx (R) femur  Number falls in past yr: - - 2 or more  Risk for fall due to : - - History of fall(s)    Vitals:   03/28/18 1401  BP: (!) 144/80  Pulse: (!) 104  Resp: 16  Temp: 99.2 F (37.3 C)  TempSrc: Oral  SpO2: 93%  Weight: 129 lb 9.6 oz (58.8 kg)  Height: 5\' 3"  (1.6 m)   Body mass index is 22.96 kg/m. Physical Exam  Constitutional: She is oriented to person, place, and time. She appears well-developed.  Elderly in no acute distress   HENT:  Head: Normocephalic.  Right Ear: External ear normal.  Left Ear: External ear normal.  Mouth/Throat: Oropharynx is clear and moist.  No oropharyngeal exudate.  Eyes: Pupils are equal, round, and reactive to light. Conjunctivae and EOM are normal. Right eye exhibits no discharge. Left eye exhibits no discharge. No scleral icterus.  Eye glasses in place   Neck: Neck supple. No JVD present. No thyromegaly present.  Cardiovascular: Normal rate, regular rhythm, normal heart sounds and intact distal pulses. Exam reveals no gallop and no friction rub.  No murmur heard. Pulmonary/Chest: Effort normal and breath sounds normal. No respiratory distress. She has no wheezes. She has no rales. She exhibits no tenderness.  Abdominal: Soft. Bowel sounds are normal. She exhibits no distension. There is no tenderness. There is no rebound and no guarding.  Musculoskeletal: She exhibits no edema or tenderness.  UE/LE strength 4/5.Unsteady gait uses Rolator.  Lymphadenopathy:    She has no cervical adenopathy.  Neurological: She is oriented to person, place, and time. Coordination normal.  Skin: Skin is warm and dry. No rash noted. No erythema. No pallor.  Psychiatric:  She has a normal mood and affect. Her speech is normal. Thought content normal.  Easily agitated towards husband during visit.     Labs reviewed: Recent Labs    10/12/17 0624 10/20/17 0750 01/20/18 01/20/18 1345  NA 141 146 141 141  K 3.2* 3.8 4.1 4.1  CL 108 106  --  107  CO2 24 29  --  20  GLUCOSE 119* 94  --  139*  BUN 14 10 25* 25  CREATININE 0.89 0.95* 1.22 1.22*  CALCIUM 8.4* 8.8 8.7 8.7   Recent Labs    10/09/17 1555 10/20/17 0750 01/20/18 01/20/18 1345  AST 45* 14 13 13   ALT 26 19 9 9   ALKPHOS 56  --  59  --   BILITOT 2.0* 0.5 0.6 0.6  PROT 6.8 5.8* 6.1 6.1  ALBUMIN 3.5  --  3.6  --    Recent Labs    10/09/17 1555  10/11/17 1209 10/12/17 0624 10/20/17 0750 01/20/18 1345  WBC 12.5*   < > 13.1* 10.1 8.9 CANCELED  NEUTROABS 12.0*  --  11.2*  --  5,340  --   HGB 13.0   < > 12.3 11.7* 11.1*  --   HCT 39.0   < > 37.8 35.3* 33.1*  --   MCV 96.8    < > 96.7 95.4 93.0  --   PLT 202   < > 151 148* 342  --    < > = values in this interval not displayed.   Lab Results  Component Value Date   TSH 2.18 09/14/2017   Lab Results  Component Value Date   HGBA1C 6.2 (H) 02/13/2018   Lab Results  Component Value Date   CHOL 246 (H) 09/14/2017   HDL 55 09/14/2017   LDLCALC 166 (H) 09/14/2017   TRIG 121 09/14/2017   CHOLHDL 4.5 09/14/2017    Significant Diagnostic Results in last 30 days:  Ct Head Wo Contrast  Result Date: 02/28/2018 CLINICAL DATA:  Memory loss hypertension headaches EXAM: CT HEAD WITHOUT CONTRAST TECHNIQUE: Contiguous axial images were obtained from the base of the skull through the vertex without intravenous contrast. COMPARISON:  None. FINDINGS: Brain: No acute territorial infarction, hemorrhage or intracranial is visualized. Mild small vessel ischemic changes of the white matter. Mild atrophy. Ventricles are nonenlarged. Vascular: No hyperdense vessels. Vertebral artery and carotid artery calcification Skull: Prominent hyperostosis.  No fracture Sinuses/Orbits: Mucosal thickening in the ethmoid and maxillary sinuses. Probable retention cysts in the inferior left maxillary sinus. Other: None IMPRESSION: 1. No CT evidence for acute intracranial abnormality. 2. Atrophy and small vessel ischemic changes of the white matter. Electronically Signed   By: Jasmine Pang M.D.   On: 02/28/2018 14:26    Assessment/Plan 1. Fever, unspecified fever cause Temp 99.2 had Tylenol in the morning.Has had Temp upto 101 three days ago.Oropharyngeal clear.Lungs clear to auscultation.Abdomen none tender to palpation.Suspect possible URI or UTI.Encourage fluid intake.Steri-urine cup send with patient to provide urine specimen for U/A and C/S to rule out UTI patient's husband reports frequency.  2. Runny nose No nasal congestion,sneezing or sinus pressure.Possible URI or season allergies.Nasal exam clear.No congestion noted.continue benadryl 25 mg  tablet every 6 hours as needed.Prefer loratadine or cetrizine due to her advance age and high risk for falls with sedative effects of benadryl but has been taking Benadryl for a while per POA.Encourage fluid intake.continue to monitor.  3. Decreased oral intake  Possible due to current illness.Encourage oral intake and Fluid intake.notify  provider if oral intake worsen.  Family/ staff Communication: Reviewed plan of care with patient and patient's husband.  Labs/tests ordered: urine specimen for U/A and C/S to rule out UTI  Caesar Bookman, NP

## 2018-03-28 NOTE — Patient Instructions (Signed)
1. Drink plenty of fluid  2. Take tylenol 325 mg tablet two by mouth every 6 hours as needed for fever Notify provider if Temp >100.5  3. Obtain urine specimen to check for urine infection.

## 2018-03-30 ENCOUNTER — Other Ambulatory Visit: Payer: Self-pay | Admitting: Family

## 2018-03-30 DIAGNOSIS — R509 Fever, unspecified: Secondary | ICD-10-CM | POA: Diagnosis not present

## 2018-03-30 LAB — URINE CULTURE
MICRO NUMBER:: 90472296
SPECIMEN QUALITY:: ADEQUATE

## 2018-03-30 LAB — URINALYSIS W MICROSCOPIC + REFLEX CULTURE
BILIRUBIN URINE: NEGATIVE
Glucose, UA: NEGATIVE
Hyaline Cast: NONE SEEN /LPF
Nitrites, Initial: POSITIVE — AB
Specific Gravity, Urine: 1.029 (ref 1.001–1.03)
pH: <=5 (ref 5.0–8.0)

## 2018-03-30 LAB — CULTURE INDICATED

## 2018-03-31 LAB — URINALYSIS W MICROSCOPIC + REFLEX CULTURE
Bilirubin Urine: NEGATIVE
Glucose, UA: NEGATIVE
Hyaline Cast: NONE SEEN /LPF
KETONES UR: NEGATIVE
NITRITES URINE, INITIAL: POSITIVE — AB
PH: 6 (ref 5.0–8.0)
RBC / HPF: >=60 /HPF — AB (ref 0–2)
SPECIFIC GRAVITY, URINE: 1.024 (ref 1.001–1.03)
Squamous Epithelial / LPF: >=28 /HPF — AB (ref <=5)

## 2018-04-01 LAB — URINE CULTURE
MICRO NUMBER:: 90483396
SPECIMEN QUALITY: ADEQUATE

## 2018-04-03 ENCOUNTER — Encounter: Payer: Self-pay | Admitting: *Deleted

## 2018-04-03 ENCOUNTER — Telehealth: Payer: Self-pay | Admitting: Family

## 2018-04-03 LAB — URINALYSIS, COMPLETE W/RFL CULTURE
Bilirubin: NEGATIVE
Glucose: NEGATIVE
Ketone: NEGATIVE
Nitrite: POSITIVE
Spec Grav, Fluid: 1.024
pH: 6

## 2018-04-03 MED ORDER — SACCHAROMYCES BOULARDII 250 MG PO CAPS: 250.0000 mg | ORAL_CAPSULE | Freq: Two times a day (BID) | ORAL | 0 refills | Status: AC

## 2018-04-03 MED ORDER — CIPROFLOXACIN HCL 500 MG PO TABS: 500.0000 mg | ORAL_TABLET | Freq: Two times a day (BID) | ORAL | 0 refills | Status: AC

## 2018-04-03 NOTE — Telephone Encounter (Signed)
Patient's husband notified and Rx's sent to [facility] as directed.

## 2018-04-03 NOTE — Telephone Encounter (Signed)
Patient's urine Analysis nitrite positive.Urine culture greater than 100,000 colonies of E.Coli.Start on Cipro 500 mg tablet twice daily x 7 days along with Florastor 250 mg capsule twice daily x 10 days.

## 2018-04-03 NOTE — Addendum Note (Signed)
Addended by: Josph MachoANCE, Damali Broadfoot A on: 04/03/2018 10:04 AM   Modules accepted: Orders

## 2018-04-06 ENCOUNTER — Other Ambulatory Visit: Payer: Self-pay | Admitting: *Deleted

## 2018-04-06 DIAGNOSIS — R413 Other amnesia: Secondary | ICD-10-CM

## 2018-04-06 MED ORDER — MEMANTINE HCL 5 MG PO TABS: ORAL_TABLET | ORAL | 1 refills | Status: DC

## 2018-04-06 NOTE — Telephone Encounter (Signed)
Ana [name] 

## 2018-04-07 ENCOUNTER — Telehealth: Payer: Self-pay

## 2018-04-07 NOTE — Telephone Encounter (Signed)
A fax was received from [facility] regarding the prior authorization for memantine 5 mg with the following questions:  1) Select the patient's diagnosis from the list below:  Alzheimer's dementia Other- Used R41.3 Memory loss  2) Is the medication being used for titration or loading dose purposes? Yes or no  3) Is the medication being used on a dose-alternating schedule? Yes or no  4) Is the higher dosage not commercially available? Yes or no  5) Please provide the specific medical reasons why the patient is unable to use the memantine 10 mg tablet.   Note from insurance pharmacy:  Memantine 10 mg is commercially available and will process at your pharmacy for up to #2 per day. Please consider switching to this commercially available dose.

## 2018-04-07 NOTE — Telephone Encounter (Signed)
A fax was received from [facility] requesting a prior authorization for memantine 5 mg tablets.   PA was initiated via [email or web address]  Keyword: [id number]  The following message was received: [facility] is reviewing your PA request. An electronic response will be received within 72 hours.

## 2018-04-10 ENCOUNTER — Other Ambulatory Visit: Payer: Self-pay

## 2018-04-10 MED ORDER — MEMANTINE HCL 10 MG PO TABS: 10.0000 mg | ORAL_TABLET | Freq: Two times a day (BID) | ORAL | 3 refills | Status: AC

## 2018-04-10 NOTE — Telephone Encounter (Signed)
Received fax from [facility] #[phone] stating Memantine is DENIED. Faxed denial to [facility] and placed original in Dr. Volney Presser box.  Member ID: [id number] Reference #:[id number]

## 2018-07-10 DIAGNOSIS — R413 Other amnesia: Secondary | ICD-10-CM

## 2018-07-10 DIAGNOSIS — I10 Essential (primary) hypertension: Secondary | ICD-10-CM

## 2018-07-10 DIAGNOSIS — E7849 Other hyperlipidemia: Secondary | ICD-10-CM

## 2018-07-10 DIAGNOSIS — E538 Deficiency of other specified B group vitamins: Secondary | ICD-10-CM

## 2018-07-17 DIAGNOSIS — I10 Essential (primary) hypertension: Secondary | ICD-10-CM | POA: Diagnosis not present

## 2018-07-17 DIAGNOSIS — E538 Deficiency of other specified B group vitamins: Secondary | ICD-10-CM | POA: Diagnosis not present

## 2018-07-17 DIAGNOSIS — R413 Other amnesia: Secondary | ICD-10-CM | POA: Diagnosis not present

## 2018-07-17 DIAGNOSIS — E7849 Other hyperlipidemia: Secondary | ICD-10-CM | POA: Diagnosis not present

## 2018-07-18 ENCOUNTER — Telehealth: Payer: Self-pay | Admitting: *Deleted

## 2018-07-18 LAB — COMPLETE METABOLIC PANEL WITH GFR
AG RATIO: 1.9 (calc) (ref 1.0–2.5)
ALT: 12 U/L (ref 6–29)
AST: 14 U/L (ref 10–35)
Albumin: 4.3 g/dL (ref 3.6–5.1)
Alkaline phosphatase (APISO): 66 U/L (ref 33–130)
BILIRUBIN TOTAL: 0.5 mg/dL (ref 0.2–1.2)
BUN/Creatinine Ratio: 18 (calc) (ref 6–22)
BUN: 21 mg/dL (ref 7–25)
CHLORIDE: 106 mmol/L (ref 98–110)
CO2: 30 mmol/L (ref 20–32)
Calcium: 9.5 mg/dL (ref 8.6–10.4)
Creat: 1.19 mg/dL — ABNORMAL HIGH (ref 0.60–0.88)
GFR, EST AFRICAN AMERICAN: 47 mL/min/{1.73_m2} — AB (ref >=60)
GFR, Est Non African American: 40 mL/min/{1.73_m2} — ABNORMAL LOW (ref >=60)
Globulin: 2.3 g/dL (calc) (ref 1.9–3.7)
Glucose, Bld: 106 mg/dL — ABNORMAL HIGH (ref 65–99)
POTASSIUM: 4.1 mmol/L (ref 3.5–5.3)
Sodium: 145 mmol/L (ref 135–146)
TOTAL PROTEIN: 6.6 g/dL (ref 6.1–8.1)

## 2018-07-18 LAB — LIPID PANEL
CHOL/HDL RATIO: 5 (calc) — AB (ref <5.0)
CHOLESTEROL: 260 mg/dL — AB (ref <200)
HDL: 52 mg/dL (ref >50)
LDL CHOLESTEROL (CALC): 179 mg/dL — AB
Non-HDL Cholesterol (Calc): 208 mg/dL (calc) — ABNORMAL HIGH (ref <130)
TRIGLYCERIDES: 148 mg/dL (ref <150)

## 2018-07-18 LAB — CBC
HCT: 41.2 % (ref 35.0–45.0)
Hemoglobin: 13.6 g/dL (ref 11.7–15.5)
MCH: 31.3 pg (ref 27.0–33.0)
MCHC: 33 g/dL (ref 32.0–36.0)
MCV: 94.9 fL (ref 80.0–100.0)
MPV: 10.9 fL (ref 7.5–12.5)
PLATELETS: 223 10*3/uL (ref 140–400)
RBC: 4.34 10*6/uL (ref 3.80–5.10)
RDW: 12.5 % (ref 11.0–15.0)
WBC: 9.8 10*3/uL (ref 3.8–10.8)

## 2018-07-18 LAB — VITAMIN B12: Vitamin B-12: 1269 pg/mL — ABNORMAL HIGH (ref 200–1100)

## 2018-07-18 NOTE — Telephone Encounter (Signed)
Patient has appt with you for tomorrow. Husband wanted to report through Crystal that patient has become very mean and aggressive with him. So much so, that he is considering moving her to [place]. Just updating you incase you need to discuss [place] with them.

## 2018-07-19 ENCOUNTER — Encounter: Payer: Self-pay | Admitting: Internal Medicine

## 2018-07-19 ENCOUNTER — Non-Acute Institutional Stay: Payer: Medicare Other | Admitting: Internal Medicine

## 2018-07-19 VITALS — BP 132/62 | HR 80 | Temp 97.5°F | Resp 18 | Ht 63.0 in | Wt 130.0 lb

## 2018-07-19 DIAGNOSIS — I10 Essential (primary) hypertension: Secondary | ICD-10-CM

## 2018-07-19 DIAGNOSIS — L309 Dermatitis, unspecified: Secondary | ICD-10-CM

## 2018-07-19 DIAGNOSIS — E7849 Other hyperlipidemia: Secondary | ICD-10-CM

## 2018-07-19 DIAGNOSIS — F0151 Vascular dementia with behavioral disturbance: Secondary | ICD-10-CM | POA: Diagnosis not present

## 2018-07-19 DIAGNOSIS — Z7189 Other specified counseling: Secondary | ICD-10-CM

## 2018-07-19 DIAGNOSIS — F01518 Vascular dementia, unspecified severity, with other behavioral disturbance: Secondary | ICD-10-CM

## 2018-07-19 DIAGNOSIS — M810 Age-related osteoporosis without current pathological fracture: Secondary | ICD-10-CM | POA: Diagnosis not present

## 2018-07-19 DIAGNOSIS — N183 Chronic kidney disease, stage 3 unspecified: Secondary | ICD-10-CM

## 2018-07-19 DIAGNOSIS — J309 Allergic rhinitis, unspecified: Secondary | ICD-10-CM

## 2018-07-19 DIAGNOSIS — R2681 Unsteadiness on feet: Secondary | ICD-10-CM

## 2018-07-19 DIAGNOSIS — H903 Sensorineural hearing loss, bilateral: Secondary | ICD-10-CM

## 2018-07-19 MED ORDER — FISH OIL 600 MG PO CAPS: 1.0000 | ORAL_CAPSULE | Freq: Every day | ORAL | 0 refills | Status: DC

## 2018-07-19 NOTE — ACP (Advance Care Planning) (Signed)
Has living will, HCPOA paperwork, copies in chart. Patient has a DNR form signed by another provider. Reviewed this today and signed a new DNR form. Copy of it made for our records. Reviewed MOST form with pt and her husband being present. Went over different sections of it. Patient and husband would like to review it further with their family and decide on treatment options. A form has been provided for her to review.

## 2018-07-19 NOTE — Telephone Encounter (Signed)
Noted. Will review in patient visit.

## 2018-07-19 NOTE — Progress Notes (Signed)
Provider:  Blanchie Serve MD Location: [place] of Service:  Clinic (12)   PCP: Blanchie Serve, MD Patient Care Team: Blanchie Serve, MD as PCP - General (Internal Medicine) Ngetich, Nelda Bucks, NP as Nurse Practitioner (Family Medicine)  Extended Emergency Contact Information Primary Emergency Contact: Gordan,[place] Address: [address]          [place], [place] [address] Johnnette Litter of [place] Phone: [phone] Relation: Daughter Secondary Emergency Contact: Verneda Skill Address: [address]          [place], [place] [address] [place] of [place] Phone: [phone] Relation: Spouse  Code Status: DNR  Goals of Care: Advanced Directive information Advanced Directives 07/19/2018  Does Patient Have a Medical Advance Directive? Yes  Type of Paramedic of [place];Living will;Out of facility DNR (pink MOST or yellow form)  Does patient want to make changes to medical advance directive? No - Patient declined  Copy of [place] in Chart? Yes  Pre-existing out of facility DNR order (yellow form or pink MOST form) -     Chief Complaint  Patient presents with  . Annual Exam    No acute concerns at this time.   . Medication Refill    No refills needed at this time  . Results    Discuss lab results   . MMSE    HPI: Patient is a 84 y.o. female seen today for an annual comprehensive examination. She is here with her husband.   Dementia- currently on donepezil 10 mg daily and namenda 10 mg bid. Per husband she has become mean with him more often than before to a point where he finds it difficult to handle. When asked for examples, she is repetitive at times and gets upset if she does not get answer to her questions easily. She appears to get frustrated over trivial things.   Osteoporosis- no fall reported. Uses walker for ambulation. Takes oscal daily with vitamin D supplement.   b12 def- takes b12  supplement. Recent lab reviewed.  Hyperlipidemia- not on any medication, per husband was on cholesterol lowering medication before, does not remember name, has been off it for unclear reason for few years now.  ckd stage 3- no urinary complaints this visit.  Hypertension- not on any BP lowering medication BP stable asymptomatic.   Past Medical History:  Diagnosis Date  . Allergic rhinitis   . Anxiety   . Balance problem 04/28/2015  . Fatigue 04/28/2015  . Hearing loss 04/28/2015  . history of Fracture of right humerus 04/28/2015  . Hyperglycemia   . Hyperlipidemia   . Hypertension   . Memory loss 04/28/2015   04/26/2014 MMSE 28/30. Failed clock drawing. 04/21/15 MMSE 21/30. Failed clock drawing.   . Osteoporosis    Past Surgical History:  Procedure Laterality Date  . ABDOMINAL HYSTERECTOMY    . CATARACT EXTRACTION  2010  . ELBOW SURGERY Right 1988  . FEMUR FRACTURE SURGERY Right 06/2014   [place], [place] Dr. Dorita Fray  . HUMERUS FRACTURE SURGERY Right 2015   [place], [place] Dr. Noemi Chapel  . TONSILLECTOMY  1935    reports that she has never smoked. She has never used smokeless tobacco. She reports that she drinks about 0.6 - 1.2 oz of alcohol per week. She reports that she does not use drugs. Social History   Socioeconomic History  . Marital status: Unknown    Spouse name: Not on file  . Number of  children: Not on file  . Years of education: Not on file  . Highest education level: Not on file  Occupational History  . Occupation: Housewife  Social Needs  . Financial resource strain: Not on file  . Food insecurity:    Worry: Not on file    Inability: Not on file  . Transportation needs:    Medical: Not on file    Non-medical: Not on file  Tobacco Use  . Smoking status: Never Smoker  . Smokeless tobacco: Never Used  Substance and Sexual Activity  . Alcohol use: Yes    Alcohol/week: 0.6 - 1.2 oz    Types: 1 - 2 Glasses of wine per week    Comment: 1-2 glasses 1-2  times a week  . Drug use: No  . Sexual activity: Not on file  Lifestyle  . Physical activity:    Days per week: Not on file    Minutes per session: Not on file  . Stress: Not on file  Relationships  . Social connections:    Talks on phone: Not on file    Gets together: Not on file    Attends religious service: Not on file    Active member of club or organization: Not on file    Attends meetings of clubs or organizations: Not on file    Relationship status: Not on file  Other Topics Concern  . Not on file  Social History Narrative   Lives at [facility] since 11/28/14   Married Elenore Rota   Never smoked   Alcohol -wine 1-2 daily   Caffeine coffee 2 daily   Exercise walking   Walks with walker   Living Will, POA, DNR   Family History  Problem Relation Age of Onset  . Heart disease Mother   . Diabetes Daughter     Pertinent  Health Maintenance Due  Topic Date Due  . DEXA SCAN  03/21/1993  . INFLUENZA VACCINE  07/13/2018  . PNA vac Low Risk Adult  Completed   Fall Risk  10/14/2017 02/17/2016 01/06/2015  Falls in the past year? No No Yes  Comment - - fx (R) humerus; fx (R) femur  Number falls in past yr: - - 2 or more  Risk for fall due to : - - History of fall(s)   Depression screen [facility] 2/9 07/19/2018 10/14/2017 08/24/2016 01/06/2015  Decreased Interest 0 0 0 0  Down, Depressed, Hopeless 0 0 0 1  PHQ - 2 Score 0 0 0 1  Altered sleeping 0 - - -  Tired, decreased energy 0 - - -  Change in appetite 0 - - -  Feeling bad or failure about yourself  0 - - -  Trouble concentrating 0 - - -  Moving slowly or fidgety/restless 0 - - -  Suicidal thoughts 0 - - -  PHQ-9 Score 0 - - -    Functional Status Survey:    Allergies  Allergen Reactions  . Ace Inhibitors   . Almond Oil   . Plum Pulp     Allergies as of 07/19/2018      Reactions   Ace Inhibitors    Almond Oil    Plum Pulp       Medication List        Accurate as of 07/19/18 11:21 AM. Always use your most  recent med list.          aspirin 325 MG EC tablet Take 325 mg as needed by  mouth.   calcium carbonate 1500 (600 Ca) MG Tabs tablet Commonly known as:  OSCAL Take 600 mg of elemental calcium by mouth daily with breakfast.   diphenhydrAMINE 25 MG tablet Commonly known as:  BENADRYL Take 25 mg by mouth every 6 (six) hours as needed for itching or allergies.   donepezil 10 MG tablet Commonly known as:  ARICEPT TAKE ONE TABLET BY MOUTH DAILY TO PRESERVE MEMORY   Fish Oil 600 MG Caps Take 1 capsule by mouth daily.   memantine 10 MG tablet Commonly known as:  NAMENDA Take 1 tablet (10 mg total) by mouth 2 (two) times daily.   Vitamin D3 1000 units Caps Take 1,000 Units daily by mouth.       Review of Systems  Constitutional: Negative for appetite change, chills, diaphoresis, fatigue and fever.  HENT: Positive for hearing loss and rhinorrhea. Negative for congestion, ear discharge, ear pain, mouth sores, sore throat and trouble swallowing.   Eyes: Negative for pain, discharge, redness, itching and visual disturbance.       Wears reading glasses  Respiratory: Negative for cough, choking, chest tightness, shortness of breath and wheezing.   Cardiovascular: Negative for chest pain, palpitations and leg swelling.  Gastrointestinal: Negative for abdominal pain, blood in stool, constipation, diarrhea, nausea, rectal pain and vomiting.  Genitourinary: Negative for dysuria, frequency, hematuria, pelvic pain and urgency.       Has occasional urinary leakage. Denies nocturia or frequency.   Musculoskeletal: Positive for gait problem. Negative for arthralgias, back pain, joint swelling and myalgias.       Uses walker for ambulation, no fall reported  Skin: Negative for rash and wound.  Neurological: Negative for dizziness, tremors, weakness, light-headedness, numbness and headaches.       Occasional tingling sensation to her feet  Hematological: Bruises/bleeds easily.    Psychiatric/Behavioral: Positive for confusion, decreased concentration and dysphoric mood. Negative for hallucinations, sleep disturbance and suicidal ideas.       Irritable per husband, no anger outburst    Vitals:   07/19/18 1010  BP: 132/62  Pulse: 80  Resp: 18  Temp: (!) 97.5 F (36.4 C)  TempSrc: Oral  SpO2: 95%  Weight: 130 lb (59 kg)  Height: _0  (1.6 m)   Body mass index is 23.03 kg/m.   Wt Readings from Last 3 Encounters:  07/19/18 130 lb (59 kg)  03/28/18 129 lb 9.6 oz (58.8 kg)  03/15/18 131 lb 9.6 oz (59.7 kg)   Physical Exam  Constitutional: She is oriented to person, place, and time. She appears well-developed and well-nourished. No distress.  HENT:  Head: Normocephalic and atraumatic.  Right Ear: External ear normal.  Left Ear: External ear normal.  Nose: Nose normal.  Mouth/Throat: Oropharynx is clear and moist. No oropharyngeal exudate.  Some cerumen to both ear canal  Eyes: Pupils are equal, round, and reactive to light. Conjunctivae and EOM are normal. Right eye exhibits no discharge. Left eye exhibits no discharge.  Neck: Normal range of motion. Neck supple.  Cardiovascular: Normal rate, regular rhythm and intact distal pulses.  No murmur heard. Pulmonary/Chest: Effort normal and breath sounds normal. No respiratory distress. She has no wheezes. She has no rales.  Abdominal: Soft. Bowel sounds are normal. There is no tenderness. There is no guarding.  Musculoskeletal: Normal range of motion. She exhibits no edema or tenderness.  Unsteady gait, uses rolling walker, arthritis changes to her fingers  Lymphadenopathy:    She has no cervical adenopathy.  Neurological: She is alert and oriented to person, place, and time. She exhibits normal muscle tone.  07/19/18 MMSE 27/30, failed clock draw  Skin: Skin is warm and dry. Capillary refill takes less than 2 seconds. She is not diaphoretic. No erythema.  Psychiatric: She has a normal mood and affect.   Appears restless    Labs reviewed: Basic Metabolic Panel: Recent Labs    10/20/17 0750 01/20/18 01/20/18 1345 07/17/18 0815  NA 146 141 141 145  K 3.8 4.1 4.1 4.1  CL 106  --  107 106  CO2 29  --  20 30  GLUCOSE 94  --  139* 106*  BUN 10 25* 25 21  CREATININE 0.95* 1.22 1.22* 1.19*  CALCIUM 8.8 8.7 8.7 9.5   Liver Function Tests: Recent Labs    10/09/17 1555  01/20/18 01/20/18 1345 07/17/18 0815  AST 45*   < > _0 ALT 26   < > _1 ALKPHOS 56  --  59  --   --   BILITOT 2.0*   < > 0.6 0.6 0.5  PROT 6.8   < > 6.1 6.1 6.6  ALBUMIN 3.5  --  3.6  --   --    < > = values in this interval not displayed.   Recent Labs    10/09/17 1555  LIPASE 19   No results for input(s): AMMONIA in the last 8760 hours. CBC: Recent Labs    10/09/17 1555  10/11/17 1209 10/12/17 0624 10/20/17 0750 01/20/18 1345 03/30/18 07/17/18 0815  WBC 12.5*   < > 13.1* 10.1 8.9 CANCELED Packed 9.8  NEUTROABS 12.0*  --  11.2*  --  5,340  --   --   --   HGB 13.0   < > 12.3 11.7* 11.1*  --   --  13.6  HCT 39.0   < > 37.8 35.3* 33.1*  --   --  41.2  MCV 96.8   < > 96.7 95.4 93.0  --   --  94.9  PLT 202   < > 151 148* 342  --   --  223   < > = values in this interval not displayed.   Cardiac Enzymes: No results for input(s): CKTOTAL, CKMB, CKMBINDEX, TROPONINI in the last 8760 hours. BNP: Invalid input(s): POCBNP Lab Results  Component Value Date   HGBA1C 6.2 (H) 02/13/2018   Lab Results  Component Value Date   TSH 2.18 09/14/2017   Lab Results  Component Value Date   VITAMINB12 1,269 (H) 07/17/2018   No results found for: FOLATE No results found for: IRON, TIBC, FERRITIN   MMSE - Mini Mental State Exam 07/19/2018 02/15/2018 09/07/2017  Not completed: (No Data) (No Data) -  Orientation to time _2 Orientation to Place _3 Registration _4 Attention/ Calculation _5 Recall 2 0 2  Language- name 2 objects _6 Language- repeat _7 Language- follow 3 step  command _8 Language- read & follow direction _9 Write a sentence _10 Copy design 1 0 1  Total score _11 Depression screen [facility] 2/9 07/19/2018 10/14/2017 08/24/2016 01/06/2015  Decreased Interest 0 0 0 0  Down, Depressed, Hopeless 0 0 0 1  PHQ - 2 Score 0 0 0 1  Altered sleeping 0 - - -  Tired, decreased  energy 0 - - -  Change in appetite 0 - - -  Feeling bad or failure about yourself  0 - - -  Trouble concentrating 0 - - -  Moving slowly or fidgety/restless 0 - - -  Suicidal thoughts 0 - - -  PHQ-9 Score 0 - - -   Imaging and Procedures obtained recently: No results found. 07/19/18 EKG- sinus rhythm, 87/min, PACs , LAD, LVH  Assessment/Plan  1. Osteoporosis without current pathological fracture, unspecified osteoporosis type Fall precautions, continue calcium and vitamin d supplement. Has been on fosamax before. Off fosamax from 08/18/18  2. Essential hypertension Stable BP, EKG shows LVH. Monitor clinically. BMP reviewed. Few PACs, noted. Pt asymptomatic. Had been on metoprolol before.  - TSH; Future  3. Other hyperlipidemia - Omega-3 Fatty Acids (FISH OIL) 600 MG CAPS; Take 1 capsule by mouth daily.  Dispense: 30 capsule; Refill: 0  4. Vascular dementia with behavior disturbance Supportive care, continue with her namenda and doenepzil. MMSE and PHQ9 updated.  - TSH; Future  5. Eczema, unspecified type Keep skin moisturized  6. Allergic rhinitis, unspecified seasonality, unspecified trigger Controlled symptom, supportive care  7. Sensorineural hearing loss (SNHL) of both ears No hearing aid, supportive care  8. Unsteady gait Walker and fall precautions  9. Goals of care, counseling/discussion Last ACP Note 04/20/2018 to 07/19/2018       ACP (Advance Care Planning) by Blanchie Serve, MD at 07/19/2018 10:00 AM    Date of Service   Author Author Type Status Note Type File Time  07/19/2018 Addend Blanchie Serve, MD Physician Signed ACP (Advance Care Planning)  07/19/2018             Has living will, HCPOA paperwork, copies in chart. Patient has a DNR form signed by another provider. Reviewed this today and signed a new DNR form. Copy of it made for our records. Reviewed MOST form with pt and her husband being present. Went over different sections of it. Patient and husband would like to review it further with their family and decide on treatment options. A form has been provided for her to review.                10. CKD (chronic kidney disease) stage 3, GFR 30-59 ml/min (HCC) Avoid NSAIDs, maintain hydration - CMP with eGFR([facility]); Future - TSH; Future  11. Annual exam the patient was counseled regarding prevention of dental and periodontal disease, diet, regular sustained exercise for at least 30 minutes 5 times per week, the proper use of sunscreen and protective clothing, and recommended schedule for cholesterol, thyroid and diabetes screening.   Family/ staff Communication: reviewed care plan with patient , charge nurse  Labs/tests ordered:  Lab Orders     CMP with eGFR([facility])     TSH   Blanchie Serve, MD Internal Medicine [place], [place] [address] Cell Phone (Monday-Friday 8 am - 5 pm): [phone] On Call: [phone] and follow prompts after 5 pm and on weekends Office Phone: [phone] Office Fax: [phone]

## 2018-07-20 ENCOUNTER — Encounter: Payer: Self-pay | Admitting: Internal Medicine

## 2018-07-26 ENCOUNTER — Encounter: Payer: Self-pay | Admitting: Internal Medicine

## 2018-08-08 ENCOUNTER — Other Ambulatory Visit: Payer: Self-pay | Admitting: *Deleted

## 2018-08-08 DIAGNOSIS — I10 Essential (primary) hypertension: Secondary | ICD-10-CM

## 2018-08-08 DIAGNOSIS — N183 Chronic kidney disease, stage 3 unspecified: Secondary | ICD-10-CM

## 2018-08-08 DIAGNOSIS — F0151 Vascular dementia with behavioral disturbance: Secondary | ICD-10-CM

## 2018-08-08 DIAGNOSIS — F01518 Vascular dementia, unspecified severity, with other behavioral disturbance: Secondary | ICD-10-CM

## 2018-08-23 ENCOUNTER — Other Ambulatory Visit: Payer: Self-pay | Admitting: Internal Medicine

## 2018-08-23 DIAGNOSIS — R413 Other amnesia: Secondary | ICD-10-CM

## 2018-10-18 ENCOUNTER — Telehealth: Payer: Self-pay | Admitting: Family

## 2018-10-18 NOTE — Telephone Encounter (Signed)
I left a message asking the patient to call me at [phone] to schedule AWV with Sara at [facility] on afternoon of 10/25/18 if available. She will also be there all day on 11/03/18. VDM (DD) °

## 2018-11-13 DIAGNOSIS — F0151 Vascular dementia with behavioral disturbance: Secondary | ICD-10-CM

## 2018-11-13 DIAGNOSIS — I10 Essential (primary) hypertension: Secondary | ICD-10-CM

## 2018-11-13 DIAGNOSIS — F01518 Vascular dementia, unspecified severity, with other behavioral disturbance: Secondary | ICD-10-CM

## 2018-11-13 DIAGNOSIS — N183 Chronic kidney disease, stage 3 unspecified: Secondary | ICD-10-CM

## 2018-11-13 LAB — COMPREHENSIVE METABOLIC PANEL
AG Ratio: 1.7 (calc) (ref 1.0–2.5)
ALBUMIN MSPROF: 3.9 g/dL (ref 3.6–5.1)
ALKALINE PHOSPHATASE (APISO): 60 U/L (ref 33–130)
ALT: 10 U/L (ref 6–29)
AST: 13 U/L (ref 10–35)
BILIRUBIN TOTAL: 0.7 mg/dL (ref 0.2–1.2)
BUN / CREAT RATIO: 17 (calc) (ref 6–22)
BUN: 17 mg/dL (ref 7–25)
CO2: 25 mmol/L (ref 20–32)
CREATININE: 1.01 mg/dL — AB (ref 0.60–0.88)
Calcium: 9.2 mg/dL (ref 8.6–10.4)
Chloride: 110 mmol/L (ref 98–110)
Globulin: 2.3 g/dL (calc) (ref 1.9–3.7)
Glucose, Bld: 108 mg/dL — ABNORMAL HIGH (ref 65–99)
POTASSIUM: 4 mmol/L (ref 3.5–5.3)
SODIUM: 146 mmol/L (ref 135–146)
Total Protein: 6.2 g/dL (ref 6.1–8.1)

## 2018-11-13 LAB — TSH: TSH: 1.95 mIU/L (ref 0.40–4.50)

## 2018-11-14 ENCOUNTER — Other Ambulatory Visit: Payer: Self-pay

## 2018-11-14 DIAGNOSIS — N183 Chronic kidney disease, stage 3 unspecified: Secondary | ICD-10-CM

## 2018-11-21 ENCOUNTER — Encounter: Payer: Self-pay | Admitting: Family

## 2018-11-21 ENCOUNTER — Non-Acute Institutional Stay: Payer: Medicare Other | Admitting: Family

## 2018-11-21 VITALS — BP 130/80 | HR 70 | Temp 98.0°F | Resp 18 | Ht 63.0 in | Wt 128.6 lb

## 2018-11-21 DIAGNOSIS — H6121 Impacted cerumen, right ear: Secondary | ICD-10-CM

## 2018-11-21 DIAGNOSIS — I10 Essential (primary) hypertension: Secondary | ICD-10-CM

## 2018-11-21 DIAGNOSIS — F0151 Vascular dementia with behavioral disturbance: Secondary | ICD-10-CM

## 2018-11-21 DIAGNOSIS — F01518 Vascular dementia, unspecified severity, with other behavioral disturbance: Secondary | ICD-10-CM

## 2018-11-21 DIAGNOSIS — L309 Dermatitis, unspecified: Secondary | ICD-10-CM | POA: Diagnosis not present

## 2018-11-21 DIAGNOSIS — R2681 Unsteadiness on feet: Secondary | ICD-10-CM

## 2018-11-21 MED ORDER — CARBAMIDE PEROXIDE 6.5 % OT SOLN: 5.0000 [drp] | Freq: Two times a day (BID) | OTIC | 0 refills | Status: AC

## 2018-11-21 NOTE — Patient Instructions (Signed)
1.Debrox 6.5 % otic solution instil 5 drops into right ear twice daily x 4 days then follow in 4 weeks for lavage.  2. Follow up in 6 months for routine visit please get labs drawn prior to visit

## 2018-11-21 NOTE — Progress Notes (Signed)
[facility] Service:  Clinic (12) Provider: Brody Kump FNP-C   Zykerria Tanton, Donalee Citrin, NP  Patient Care Team: Bijon Mineer, Donalee Citrin, NP as PCP - General (Family Medicine)  Extended Emergency Contact Information Primary Emergency Contact: Gordan,[place] Address: [address]          [place], [place] [address] [place] of [place] Home Phone: [phone] Relation: Daughter Secondary Emergency Contact: Matas,Donald Address: [address]          [place], [place] [address] [place] of [place] Home Phone: [phone] Relation: Spouse  Code Status: DNR  Goals of care: Advanced Directive information Advanced Directives 07/19/2018  Does Patient Have a Medical Advance Directive? Yes  Type of Estate agent of [place];Living will;Out of facility DNR (pink MOST or yellow form)  Does patient want to make changes to medical advance directive? No - Patient declined  Copy of Healthcare Power of Attorney in Chart? Yes  Pre-existing out of facility DNR order (yellow form or pink MOST form) -     Chief Complaint  Patient presents with  . Medical Management of Chronic Issues    Follow up after lab work    HPI:  Pt is a 84 y.o. female seen today [facility] for medical management of chronic diseases.she is here today escorted by husband.she denies any acute issues and questions why she needs to be seen by a provider even when feeling well.Patient's husband reports no new concerns. Hypertension -  She denies any headache,dizziness,chest pain or shortness of breath or faintness.currently of medication.   Eczema - continues to itchy but husband states has a cream prescribed by dermatology.unclear if she applies cream.   Dementia - No new behavioral issues reported though argues a lot with POA.On Aricept  10 mg tablet and Namenda 10 mg tablet twice daily.  Hyperlipidemia - Recent LDL 179,cholestrol 260.Per husband eats balance diet and  enjoys eating salad.On omega -3 fatty acid 600 mg capsule daily.   Unsteady gait - Ambulates using Rolator.she states no fall in the past 3 years.    Past Medical History:  Diagnosis Date  . Allergic rhinitis   . Anxiety   . Balance problem 04/28/2015  . Fatigue 04/28/2015  . Hearing loss 04/28/2015  . history of Fracture of right humerus 04/28/2015  . Hyperglycemia   . Hyperlipidemia   . Hypertension   . Memory loss 04/28/2015   04/26/2014 MMSE 28/30. Failed clock drawing. 04/21/15 MMSE 21/30. Failed clock drawing.   . Osteoporosis    Past Surgical History:  Procedure Laterality Date  . ABDOMINAL HYSTERECTOMY    . CATARACT EXTRACTION  2010  . ELBOW SURGERY Right 1988  . FEMUR FRACTURE SURGERY Right 06/2014   [place], [place] Dr. Dione Housekeeper  . HUMERUS FRACTURE SURGERY Right 2015   [place], [place] Dr. Eber Hong  . TONSILLECTOMY  1935    Allergies  Allergen Reactions  . Ace Inhibitors   . Almond Oil   . Plum Pulp     Allergies as of 11/21/2018      Reactions   Ace Inhibitors    Almond Oil    Plum Pulp       Medication List        Accurate as of 11/21/18  2:04 PM. Always use your most recent med list.          aspirin 325 MG EC tablet Take 325 mg as needed by mouth.   calcium carbonate  1500 (600 Ca) MG Tabs tablet Commonly known as:  OSCAL Take 600 mg of elemental calcium by mouth daily with breakfast.   carbamide peroxide 6.5 % OTIC solution Commonly known as:  DEBROX Place 5 drops into the right ear 2 (two) times daily for 4 days.   diphenhydrAMINE 25 MG tablet Commonly known as:  BENADRYL Take 25 mg by mouth every 6 (six) hours as needed for itching or allergies.   donepezil 10 MG tablet Commonly known as:  ARICEPT TAKE ONE TABLET BY MOUTH DAILY TO PRESERVE MEMORY   Fish Oil 600 MG Caps Take 1 capsule by mouth daily.   memantine 10 MG tablet Commonly known as:  NAMENDA Take 1 tablet (10 mg total) by mouth 2 (two) times daily.   Vitamin D3 25  MCG (1000 UT) Caps Take 1,000 Units daily by mouth.       Review of Systems  Unable to perform ROS: Dementia (additional information provided by patient's husband present during visit )  Constitutional: Negative for appetite change, chills, fatigue and fever.  HENT: Positive for hearing loss. Negative for congestion, rhinorrhea, sinus pressure, sinus pain, sneezing, sore throat and trouble swallowing.   Eyes: Positive for visual disturbance. Negative for discharge, redness and itching.  Respiratory: Negative for cough, chest tightness, shortness of breath and wheezing.   Cardiovascular: Negative for chest pain, palpitations and leg swelling.  Gastrointestinal: Negative for abdominal distention, abdominal pain, constipation, diarrhea, nausea and vomiting.  Endocrine: Negative for cold intolerance, heat intolerance, polydipsia, polyphagia and polyuria.  Genitourinary: Negative for dysuria, flank pain, frequency and urgency.  Musculoskeletal: Positive for gait problem. Negative for arthralgias and back pain.  Skin: Negative for color change, pallor and rash.       Scratched areas on arms and upper back   Neurological: Negative for dizziness, weakness, light-headedness and headaches.  Hematological: Does not bruise/bleed easily.  Psychiatric/Behavioral: Positive for confusion. Negative for agitation and sleep disturbance. The patient is not nervous/anxious.     Immunization History  Administered Date(s) Administered  . DTaP 05/12/2014  . Influenza, High Dose Seasonal PF 09/21/2017  . Influenza,inj,Quad PF,6+ Mos 09/14/2018  . Influenza-Unspecified 10/07/2014, 09/11/2015, 09/23/2016  . PPD Test 10/16/2014  . Pneumococcal Conjugate-13 10/20/2017  . Pneumococcal Polysaccharide-23 08/20/2011   Pertinent  Health Maintenance Due  Topic Date Due  . DEXA SCAN  03/21/1993  . INFLUENZA VACCINE  Completed  . PNA vac Low Risk Adult  Completed   Fall Risk  11/21/2018 10/14/2017 02/17/2016  01/06/2015  Falls in the past year? 0 No No Yes  Comment - - - fx (R) humerus; fx (R) femur  Number falls in past yr: 0 - - 2 or more  Injury with Fall? 0 - - -  Risk for fall due to : - - - History of fall(s)    Vitals:   11/21/18 1335  BP: 130/80  Pulse: 70  Temp: 98 F (36.7 C)  TempSrc: Oral  SpO2: 92%  Weight: 128 lb 9.6 oz (58.3 kg)  Height: 5\' 3"  (1.6 m)   Body mass index is 22.78 kg/m. Physical Exam  Constitutional: She is oriented to person, place, and time. She appears well-developed and well-nourished.  Elderly,argumentative during visit in no acute distress   HENT:  Head: Normocephalic.  Left Ear: External ear normal.  Mouth/Throat: Oropharynx is clear and moist.  Right ear cerumen impaction   Eyes: Pupils are equal, round, and reactive to light. Conjunctivae and EOM are normal. Right eye exhibits  no discharge. Left eye exhibits no discharge. No scleral icterus.  Neck: Normal range of motion. No JVD present. No thyromegaly present.  Cardiovascular: Normal rate, regular rhythm, normal heart sounds and intact distal pulses. Exam reveals no gallop and no friction rub.  No murmur heard. Pulmonary/Chest: Effort normal. No respiratory distress. She has no wheezes. She has no rales.  Abdominal: Soft. Bowel sounds are normal. She exhibits no distension and no mass. There is no tenderness. There is no rebound and no guarding.  Musculoskeletal: Normal range of motion. She exhibits no edema or tenderness.  Unsteady gait ambulates with walker.no recent fall episode  Lymphadenopathy:    She has no cervical adenopathy.  Neurological: She is oriented to person, place, and time. She has normal strength. Gait abnormal.  Skin: Skin is warm and dry. No erythema. No pallor.  Scratched areas on forearm and upper back.   Psychiatric: She has a normal mood and affect. Her speech is normal and behavior is normal. Judgment and thought content normal. She exhibits abnormal recent memory.    Easily agitated and argumentative   Vitals reviewed.   Labs reviewed: Recent Labs    01/20/18 1345 07/17/18 0815 11/13/18 0800  NA 141 145 146  K 4.1 4.1 4.0  CL 107 106 110  CO2 20 30 25   GLUCOSE 139* 106* 108*  BUN 25 21 17   CREATININE 1.22* 1.19* 1.01*  CALCIUM 8.7 9.5 9.2   Recent Labs    01/20/18 01/20/18 1345 07/17/18 0815 11/13/18 0800  AST 13 13 14 13   ALT 9 9 12 10   ALKPHOS 59  --   --   --   BILITOT 0.6 0.6 0.5 0.7  PROT 6.1 6.1 6.6 6.2  ALBUMIN 3.6  --   --   --    Recent Labs    01/20/18 1345 03/30/18 07/17/18 0815  WBC CANCELED Packed 9.8  HGB  --   --  13.6  HCT  --   --  41.2  MCV  --   --  94.9  PLT  --   --  223   Lab Results  Component Value Date   TSH 1.95 11/13/2018   Lab Results  Component Value Date   HGBA1C 6.2 (H) 02/13/2018   Lab Results  Component Value Date   CHOL 260 (H) 07/17/2018   HDL 52 07/17/2018   LDLCALC 179 (H) 07/17/2018   TRIG 148 07/17/2018   CHOLHDL 5.0 (H) 07/17/2018    Significant Diagnostic Results in last 30 days:  No results found.  Assessment/Plan 1. Impacted cerumen of right ear TM not visulized due to cerumen impaction.Instil debrox 6.5% otic solution 5 drops into right ear twice daily x 4 days then follow up here for ear lavage.   2. Hypertension, unspecified type B/p stable.currently no medication.continue on ASA daily for chest pain prophylaxis. CBC,CMP and TSH level,future   3. Eczema, unspecified type Apply cream as directed.Name unknown.Notify provider if worsening or not resolved.   4. Unsteady gait No recent fall episode.Encouraged hydration and fall  And safety precautions.  5. Vascular dementia with behavior disturbance (HCC) No new behavioral changes per husband.continue on Aricept and Namenda.continue with supportive care.   Family/ staff Communication: Reviewed plan of care with patient and facility Nurse supervisor   Labs/tests ordered: CBC,CMP,TSH level future  Follow up :  6 months labs prior to visit    Caesar Bookmaninah C Jhon Mallozzi, NP

## 2018-11-29 ENCOUNTER — Encounter: Payer: Self-pay | Admitting: Family Medicine

## 2018-11-29 ENCOUNTER — Non-Acute Institutional Stay: Payer: Medicare Other | Admitting: Family Medicine

## 2018-11-29 VITALS — BP 140/84 | HR 56 | Temp 98.1°F | Ht 63.0 in | Wt 128.6 lb

## 2018-11-29 DIAGNOSIS — H903 Sensorineural hearing loss, bilateral: Secondary | ICD-10-CM

## 2018-11-29 DIAGNOSIS — R413 Other amnesia: Secondary | ICD-10-CM

## 2018-11-29 NOTE — Progress Notes (Signed)
Provider:  Jacalyn LefevreStephen , MD Location:  [facility]   Place of Service:  Clinic (12)  PCP: Ngetich, Donalee Citrininah C, NP Patient Care Team: Ngetich, Donalee Citrininah C, NP as PCP - General (Family Medicine)  Extended Emergency Contact Information Primary Emergency Contact: Gordan,[place] Address: [address]          [place], [place] [address] [place] Home Phone: [phone] Relation: Daughter Secondary Emergency Contact: Iven FinnGarrigan,Donald Address: [address]          [place], [place] [address] [place] Home Phone: [phone] Relation: Spouse  Code Status: DNR Goals of Care: Advanced Directive information Advanced Directives 07/19/2018  Does Patient Have a Medical Advance Directive? Yes  Type of Estate agentAdvance Directive Healthcare Power of [place];Living will;Out of facility DNR (pink MOST or yellow form)  Does patient want to make changes to medical advance directive? No - Patient declined  Copy of Healthcare Power of Attorney in Chart? Yes  Pre-existing out of facility DNR order (yellow form or pink MOST form) -      Chief Complaint  Patient presents with  . Acute Visit    impacted right ear     HPI: Patient is a 84 y.o. female seen today for recheck of right ear hearing loss.  Patient has been using drops since her last visit here and returns today for irrigation.  Past Medical History:  Diagnosis Date  . Allergic rhinitis   . Anxiety   . Balance problem 04/28/2015  . Fatigue 04/28/2015  . Hearing loss 04/28/2015  . history of Fracture of right humerus 04/28/2015  . Hyperglycemia   . Hyperlipidemia   . Hypertension   . Memory loss 04/28/2015   04/26/2014 MMSE 28/30. Failed clock drawing. 04/21/15 MMSE 21/30. Failed clock drawing.   . Osteoporosis    Past Surgical History:  Procedure Laterality Date  . ABDOMINAL HYSTERECTOMY    . CATARACT EXTRACTION  2010  . ELBOW SURGERY Right 1988  . FEMUR FRACTURE SURGERY Right 06/2014   [place], [place] Dr. Dione HousekeeperJohn  [place]  . HUMERUS FRACTURE SURGERY Right 2015   [place] Dr. Eber HongBrian   . TONSILLECTOMY  1935    reports that she has never smoked. She has never used smokeless tobacco. She reports current alcohol use of about 1.0 - 2.0 standard drinks of alcohol per week. She reports that she does not use drugs. Social History   Socioeconomic History  . Marital status: Unknown    Spouse name: Not on file  . Number of children: Not on file  . Years of education: Not on file  . Highest education level: Not on file  Occupational History  . Occupation: Housewife  Social Needs  . Financial resource strain: Not on file  . Food insecurity:    Worry: Not on file    Inability: Not on file  . Transportation needs:    Medical: Not on file    Non-medical: Not on file  Tobacco Use  . Smoking status: Never Smoker  . Smokeless tobacco: Never Used  Substance and Sexual Activity  . Alcohol use: Yes    Alcohol/week: 1.0 - 2.0 standard drinks    Types: 1 - 2 Glasses of wine per week    Comment: 1-2 glasses 1-2 times a week  . Drug use: No  . Sexual activity: Not on file  Lifestyle  . Physical activity:    Days per week: Not on file    Minutes per session: Not on  file  . Stress: Not on file  Relationships  . Social connections:    Talks on phone: Not on file    Gets together: Not on file    Attends religious service: Not on file    Active member of club or organization: Not on file    Attends meetings of clubs or organizations: Not on file    Relationship status: Not on file  . Intimate partner violence:    Fear of current or ex partner: Not on file    Emotionally abused: Not on file    Physically abused: Not on file    Forced sexual activity: Not on file  Other Topics Concern  . Not on file  Social History Narrative   [facility] since [facility]/28/14   Married [name]   Never smoked   Alcohol -wine 1-2 daily   Caffeine coffee 2 daily   Exercise walking   Walks with  walker   Living Will, POA, DNR    Functional Status Survey:    Family History  Problem Relation Age of Onset  . Heart disease Mother   . Diabetes Daughter     Health Maintenance  Topic Date Due  . DEXA SCAN  03/21/1993  . TETANUS/TDAP  12/14/2023  . INFLUENZA VACCINE  Completed  . PNA vac Low Risk Adult  Completed    Allergies  Allergen Reactions  . Ace Inhibitors   . Almond Oil   . Plum Pulp     Outpatient Encounter Medications as of 11/29/2018  Medication Sig  . aspirin 325 MG EC tablet Take 325 mg as needed by mouth.   . calcium carbonate (OSCAL) 1500 (600 Ca) MG TABS tablet Take 600 mg of elemental calcium by mouth daily with breakfast.  . Cholecalciferol (VITAMIN D3) 1000 UNITS CAPS Take 1,000 Units daily by mouth.   . diphenhydrAMINE (BENADRYL) 25 MG tablet Take 25 mg by mouth every 6 (six) hours as needed for itching or allergies.  Marland Kitchen donepezil (ARICEPT) 10 MG tablet TAKE ONE TABLET BY MOUTH DAILY TO PRESERVE MEMORY  . memantine (NAMENDA) 10 MG tablet Take 1 tablet (10 mg total) by mouth 2 (two) times daily.  . Omega-3 Fatty Acids (FISH OIL) 600 MG CAPS Take 1 capsule by mouth daily.   No facility-administered encounter medications on file as of 11/29/2018.     Review of Systems  Constitutional: Negative.   HENT: Positive for hearing loss.   Respiratory: Negative.   Cardiovascular: Negative.   Psychiatric/Behavioral: Positive for behavioral problems and confusion.    Vitals:   11/29/18 1208  BP: 140/84  Pulse: (!) 56  Temp: 98.1 F (36.7 C)  TempSrc: Oral  SpO2: 95%  Weight: 128 lb 9.6 oz (58.3 kg)  Height: 5\' 3"  (1.6 m)   Body mass index is 22.78 kg/m. Physical Exam Constitutional:      Appearance: Normal appearance.  HENT:     Ears:     Comments: Cannot visualize right tympanic membrane due to cerumen buildup.  This was successfully irrigated and on reexam external auditory canal was clear. Neurological:     Mental Status: She is alert.      Labs reviewed: Basic Metabolic Panel: Recent Labs    01/20/18 1345 07/17/18 0815 11/13/18 0800  NA 141 145 146  K 4.1 4.1 4.0  CL 107 106 110  CO2 20 30 25   GLUCOSE 139* 106* 108*  BUN 25 21 17   CREATININE 1.22* 1.19* 1.01*  CALCIUM  8.7 9.5 9.2   Liver Function Tests: Recent Labs    01/20/18 01/20/18 1345 07/17/18 0815 11/13/18 0800  AST 13 13 14 13   ALT 9 9 12 10   ALKPHOS 59  --   --   --   BILITOT 0.6 0.6 0.5 0.7  PROT 6.1 6.1 6.6 6.2  ALBUMIN 3.6  --   --   --    No results for input(s): LIPASE, AMYLASE in the last 8760 hours. No results for input(s): AMMONIA in the last 8760 hours. CBC: Recent Labs    01/20/18 1345 03/30/18 07/17/18 0815  WBC CANCELED Packed 9.8  HGB  --   --  13.6  HCT  --   --  41.2  MCV  --   --  94.9  PLT  --   --  223   Cardiac Enzymes: No results for input(s): CKTOTAL, CKMB, CKMBINDEX, TROPONINI in the last 8760 hours. BNP: Invalid input(s): POCBNP Lab Results  Component Value Date   HGBA1C 6.2 (H) 02/13/2018   Lab Results  Component Value Date   TSH 1.95 11/13/2018   Lab Results  Component Value Date   VITAMINB12 1,269 (H) 07/17/2018   No results found for: FOLATE No results found for: IRON, TIBC, FERRITIN  Imaging and Procedures obtained prior to SNF admission: Ct Head Wo Contrast  Result Date: 02/28/2018 CLINICAL DATA:  Memory loss hypertension headaches EXAM: CT HEAD WITHOUT CONTRAST TECHNIQUE: Contiguous axial images were obtained from the base of the skull through the vertex without intravenous contrast. COMPARISON:  None. FINDINGS: Brain: No acute territorial infarction, hemorrhage or intracranial is visualized. Mild small vessel ischemic changes of the white matter. Mild atrophy. Ventricles are nonenlarged. Vascular: No hyperdense vessels. Vertebral artery and carotid artery calcification Skull: Prominent hyperostosis.  No fracture Sinuses/Orbits: Mucosal thickening in the ethmoid and maxillary sinuses.  Probable retention cysts in the inferior left maxillary sinus. Other: None IMPRESSION: 1. No CT evidence for acute intracranial abnormality. 2. Atrophy and small vessel ischemic changes of the white matter. Electronically Signed   By: Jasmine Pang M.D.   On: 02/28/2018 14:26    Assessment/Plan 1. Memory loss Patient functions in this environment marginally with the help of her husband.  Believes she could live independent and might require higher level of care if it were not for his help   2. Sensorineural hearing loss (SNHL) of both ears Is some underlying hearing loss but problem was compounded by a buildup of cerumen which was successfully irrigated today   Family/ staff Communication: Findings communicated to patient  Labs/tests ordered:  Bertram Millard. Hyacinth Meeker, MD [facility] [address] [place], [place] [address] Office [phone]

## 2019-03-05 ENCOUNTER — Inpatient Hospital Stay (HOSPITAL_COMMUNITY): Payer: Medicare Other

## 2019-03-05 ENCOUNTER — Emergency Department (HOSPITAL_COMMUNITY): Payer: Medicare Other

## 2019-03-05 ENCOUNTER — Inpatient Hospital Stay (HOSPITAL_COMMUNITY)
Admission: EM | Admit: 2019-03-05 | Discharge: 2019-03-07 | DRG: 065 | Disposition: A | Discharge status: Skilled Nursing Facility | Payer: Medicare Other | Attending: Internal Medicine | Admitting: Internal Medicine

## 2019-03-05 ENCOUNTER — Encounter (HOSPITAL_COMMUNITY): Payer: Self-pay

## 2019-03-05 DIAGNOSIS — R29708 NIHSS score 8: Secondary | ICD-10-CM | POA: Diagnosis not present

## 2019-03-05 DIAGNOSIS — Z743 Need for continuous supervision: Secondary | ICD-10-CM | POA: Diagnosis not present

## 2019-03-05 DIAGNOSIS — F419 Anxiety disorder, unspecified: Secondary | ICD-10-CM | POA: Diagnosis present

## 2019-03-05 DIAGNOSIS — M810 Age-related osteoporosis without current pathological fracture: Secondary | ICD-10-CM | POA: Diagnosis present

## 2019-03-05 DIAGNOSIS — Z79899 Other long term (current) drug therapy: Secondary | ICD-10-CM | POA: Diagnosis not present

## 2019-03-05 DIAGNOSIS — S42301A Unspecified fracture of shaft of humerus, right arm, initial encounter for closed fracture: Secondary | ICD-10-CM | POA: Diagnosis present

## 2019-03-05 DIAGNOSIS — R413 Other amnesia: Secondary | ICD-10-CM | POA: Diagnosis present

## 2019-03-05 DIAGNOSIS — Z7982 Long term (current) use of aspirin: Secondary | ICD-10-CM | POA: Diagnosis not present

## 2019-03-05 DIAGNOSIS — I672 Cerebral atherosclerosis: Secondary | ICD-10-CM | POA: Diagnosis not present

## 2019-03-05 DIAGNOSIS — I739 Peripheral vascular disease, unspecified: Secondary | ICD-10-CM | POA: Diagnosis not present

## 2019-03-05 DIAGNOSIS — Z888 Allergy status to other drugs, medicaments and biological substances status: Secondary | ICD-10-CM

## 2019-03-05 DIAGNOSIS — R9082 White matter disease, unspecified: Secondary | ICD-10-CM | POA: Diagnosis present

## 2019-03-05 DIAGNOSIS — S32009A Unspecified fracture of unspecified lumbar vertebra, initial encounter for closed fracture: Secondary | ICD-10-CM | POA: Diagnosis present

## 2019-03-05 DIAGNOSIS — I69322 Dysarthria following cerebral infarction: Secondary | ICD-10-CM | POA: Diagnosis not present

## 2019-03-05 DIAGNOSIS — Z9181 History of falling: Secondary | ICD-10-CM

## 2019-03-05 DIAGNOSIS — Z90710 Acquired absence of both cervix and uterus: Secondary | ICD-10-CM

## 2019-03-05 DIAGNOSIS — E7800 Pure hypercholesterolemia, unspecified: Secondary | ICD-10-CM

## 2019-03-05 DIAGNOSIS — R4781 Slurred speech: Secondary | ICD-10-CM | POA: Diagnosis not present

## 2019-03-05 DIAGNOSIS — Z8781 Personal history of (healed) traumatic fracture: Secondary | ICD-10-CM | POA: Diagnosis not present

## 2019-03-05 DIAGNOSIS — I69828 Other speech and language deficits following other cerebrovascular disease: Secondary | ICD-10-CM | POA: Diagnosis not present

## 2019-03-05 DIAGNOSIS — R7303 Prediabetes: Secondary | ICD-10-CM | POA: Diagnosis not present

## 2019-03-05 DIAGNOSIS — I129 Hypertensive chronic kidney disease with stage 1 through stage 4 chronic kidney disease, or unspecified chronic kidney disease: Secondary | ICD-10-CM | POA: Diagnosis not present

## 2019-03-05 DIAGNOSIS — Z8249 Family history of ischemic heart disease and other diseases of the circulatory system: Secondary | ICD-10-CM | POA: Diagnosis not present

## 2019-03-05 DIAGNOSIS — R2689 Other abnormalities of gait and mobility: Secondary | ICD-10-CM | POA: Diagnosis present

## 2019-03-05 DIAGNOSIS — N182 Chronic kidney disease, stage 2 (mild): Secondary | ICD-10-CM | POA: Diagnosis not present

## 2019-03-05 DIAGNOSIS — I69891 Dysphagia following other cerebrovascular disease: Secondary | ICD-10-CM | POA: Diagnosis not present

## 2019-03-05 DIAGNOSIS — F0390 Unspecified dementia without behavioral disturbance: Secondary | ICD-10-CM | POA: Diagnosis present

## 2019-03-05 DIAGNOSIS — E785 Hyperlipidemia, unspecified: Secondary | ICD-10-CM | POA: Diagnosis not present

## 2019-03-05 DIAGNOSIS — Z91018 Allergy to other foods: Secondary | ICD-10-CM | POA: Diagnosis not present

## 2019-03-05 DIAGNOSIS — I69922 Dysarthria following unspecified cerebrovascular disease: Secondary | ICD-10-CM | POA: Diagnosis not present

## 2019-03-05 DIAGNOSIS — I10 Essential (primary) hypertension: Secondary | ICD-10-CM | POA: Diagnosis not present

## 2019-03-05 DIAGNOSIS — Z833 Family history of diabetes mellitus: Secondary | ICD-10-CM | POA: Diagnosis not present

## 2019-03-05 DIAGNOSIS — G8191 Hemiplegia, unspecified affecting right dominant side: Secondary | ICD-10-CM | POA: Diagnosis not present

## 2019-03-05 DIAGNOSIS — Z66 Do not resuscitate: Secondary | ICD-10-CM | POA: Diagnosis not present

## 2019-03-05 DIAGNOSIS — R4701 Aphasia: Secondary | ICD-10-CM | POA: Diagnosis not present

## 2019-03-05 DIAGNOSIS — I639 Cerebral infarction, unspecified: Secondary | ICD-10-CM | POA: Diagnosis not present

## 2019-03-05 DIAGNOSIS — R471 Dysarthria and anarthria: Secondary | ICD-10-CM | POA: Diagnosis present

## 2019-03-05 DIAGNOSIS — E7849 Other hyperlipidemia: Secondary | ICD-10-CM | POA: Diagnosis not present

## 2019-03-05 DIAGNOSIS — R531 Weakness: Secondary | ICD-10-CM | POA: Diagnosis not present

## 2019-03-05 DIAGNOSIS — H9190 Unspecified hearing loss, unspecified ear: Secondary | ICD-10-CM | POA: Diagnosis not present

## 2019-03-05 DIAGNOSIS — G459 Transient cerebral ischemic attack, unspecified: Secondary | ICD-10-CM | POA: Diagnosis not present

## 2019-03-05 DIAGNOSIS — M6281 Muscle weakness (generalized): Secondary | ICD-10-CM | POA: Diagnosis not present

## 2019-03-05 DIAGNOSIS — I69392 Facial weakness following cerebral infarction: Secondary | ICD-10-CM | POA: Diagnosis not present

## 2019-03-05 DIAGNOSIS — S8291XA Unspecified fracture of right lower leg, initial encounter for closed fracture: Secondary | ICD-10-CM | POA: Diagnosis present

## 2019-03-05 DIAGNOSIS — S42201D Unspecified fracture of upper end of right humerus, subsequent encounter for fracture with routine healing: Secondary | ICD-10-CM | POA: Diagnosis not present

## 2019-03-05 DIAGNOSIS — R279 Unspecified lack of coordination: Secondary | ICD-10-CM | POA: Diagnosis not present

## 2019-03-05 DIAGNOSIS — R278 Other lack of coordination: Secondary | ICD-10-CM | POA: Diagnosis not present

## 2019-03-05 DIAGNOSIS — I700 Atherosclerosis of aorta: Secondary | ICD-10-CM | POA: Diagnosis not present

## 2019-03-05 DIAGNOSIS — R29810 Facial weakness: Secondary | ICD-10-CM | POA: Diagnosis not present

## 2019-03-05 DIAGNOSIS — F418 Other specified anxiety disorders: Secondary | ICD-10-CM | POA: Diagnosis present

## 2019-03-05 DIAGNOSIS — R2681 Unsteadiness on feet: Secondary | ICD-10-CM | POA: Diagnosis not present

## 2019-03-05 DIAGNOSIS — I6302 Cerebral infarction due to thrombosis of basilar artery: Secondary | ICD-10-CM | POA: Diagnosis not present

## 2019-03-05 DIAGNOSIS — I6389 Other cerebral infarction: Secondary | ICD-10-CM | POA: Diagnosis not present

## 2019-03-05 HISTORY — DX: Hyperlipidemia, unspecified: E78.5

## 2019-03-05 LAB — COMPREHENSIVE METABOLIC PANEL
ALT: 14 U/L (ref 0–44)
AST: 20 U/L (ref 15–41)
Albumin: 3.6 g/dL (ref 3.5–5.0)
Alkaline Phosphatase: 49 U/L (ref 38–126)
Anion gap: 9 (ref 5–15)
BILIRUBIN TOTAL: 0.6 mg/dL (ref 0.3–1.2)
BUN: 17 mg/dL (ref 8–23)
CALCIUM: 9.4 mg/dL (ref 8.9–10.3)
CO2: 26 mmol/L (ref 22–32)
Chloride: 109 mmol/L (ref 98–111)
Creatinine, Ser: 0.92 mg/dL (ref 0.44–1.00)
GFR calc non Af Amer: 55 mL/min — ABNORMAL LOW (ref >60)
Glucose, Bld: 96 mg/dL (ref 70–99)
Potassium: 3.8 mmol/L (ref 3.5–5.1)
Sodium: 144 mmol/L (ref 135–145)
Total Protein: 6.2 g/dL — ABNORMAL LOW (ref 6.5–8.1)

## 2019-03-05 LAB — DIFFERENTIAL
Abs Immature Granulocytes: 0.03 10*3/uL (ref 0.00–0.07)
Basophils Absolute: 0.1 10*3/uL (ref 0.0–0.1)
Basophils Relative: 1 %
Eosinophils Absolute: 0.1 10*3/uL (ref 0.0–0.5)
Eosinophils Relative: 1 %
Immature Granulocytes: 0 %
Lymphocytes Relative: 34 %
Lymphs Abs: 3.1 10*3/uL (ref 0.7–4.0)
MONO ABS: 0.7 10*3/uL (ref 0.1–1.0)
Monocytes Relative: 8 %
Neutro Abs: 5.2 10*3/uL (ref 1.7–7.7)
Neutrophils Relative %: 56 %

## 2019-03-05 LAB — PROTIME-INR
INR: 1 (ref 0.8–1.2)
PROTHROMBIN TIME: 13.3 s (ref 11.4–15.2)

## 2019-03-05 LAB — CBC
HCT: 40 % (ref 36.0–46.0)
Hemoglobin: 13 g/dL (ref 12.0–15.0)
MCH: 32.3 pg (ref 26.0–34.0)
MCHC: 32.5 g/dL (ref 30.0–36.0)
MCV: 99.5 fL (ref 80.0–100.0)
Platelets: 209 10*3/uL (ref 150–400)
RBC: 4.02 MIL/uL (ref 3.87–5.11)
RDW: 13 % (ref 11.5–15.5)
WBC: 9.2 10*3/uL (ref 4.0–10.5)
nRBC: 0 % (ref 0.0–0.2)

## 2019-03-05 LAB — APTT: APTT: 30 s (ref 24–36)

## 2019-03-05 LAB — ETHANOL: Alcohol, Ethyl (B): <10 mg/dL (ref <10)

## 2019-03-05 MED ORDER — SODIUM CHLORIDE 0.9 % IV SOLN
INTRAVENOUS | Status: DC
  Administered 2019-03-05 – 2019-03-07 (×2): via INTRAVENOUS

## 2019-03-05 MED ORDER — ACETAMINOPHEN 160 MG/5ML PO SOLN: 650.0000 mg | ORAL | Status: DC | PRN

## 2019-03-05 MED ORDER — ENOXAPARIN SODIUM 40 MG/0.4ML ~~LOC~~ SOLN
40.0000 mg | SUBCUTANEOUS | Status: DC
  Administered 2019-03-05 – 2019-03-06 (×2): 40 mg via SUBCUTANEOUS
  Filled 2019-03-05 (×2): qty 0.4

## 2019-03-05 MED ORDER — ATORVASTATIN CALCIUM 40 MG PO TABS
40.0000 mg | ORAL_TABLET | Freq: Every day | ORAL | Status: DC
  Administered 2019-03-05 – 2019-03-06 (×2): 40 mg via ORAL
  Filled 2019-03-05 (×2): qty 1

## 2019-03-05 MED ORDER — SENNOSIDES-DOCUSATE SODIUM 8.6-50 MG PO TABS: 1.0000 | ORAL_TABLET | Freq: Every evening | ORAL | Status: DC | PRN

## 2019-03-05 MED ORDER — ATORVASTATIN CALCIUM 40 MG PO TABS: 40.0000 mg | ORAL_TABLET | Freq: Every day | ORAL | Status: DC

## 2019-03-05 MED ORDER — HYDRALAZINE HCL 20 MG/ML IJ SOLN: 5.0000 mg | Freq: Four times a day (QID) | INTRAMUSCULAR | Status: DC | PRN

## 2019-03-05 MED ORDER — MEMANTINE HCL 10 MG PO TABS
10.0000 mg | ORAL_TABLET | Freq: Two times a day (BID) | ORAL | Status: DC
  Administered 2019-03-05 – 2019-03-07 (×4): 10 mg via ORAL
  Filled 2019-03-05 (×4): qty 1

## 2019-03-05 MED ORDER — ACETAMINOPHEN 650 MG RE SUPP: 650.0000 mg | RECTAL | Status: DC | PRN

## 2019-03-05 MED ORDER — STROKE: EARLY STAGES OF RECOVERY BOOK
Freq: Once | Status: AC
  Administered 2019-03-05: 19:00:00

## 2019-03-05 MED ORDER — DONEPEZIL HCL 10 MG PO TABS
10.0000 mg | ORAL_TABLET | Freq: Every day | ORAL | Status: DC
  Administered 2019-03-05 – 2019-03-07 (×3): 10 mg via ORAL
  Filled 2019-03-05: qty 1
  Filled 2019-03-05: qty 2
  Filled 2019-03-05: qty 1

## 2019-03-05 MED ORDER — ACETAMINOPHEN 325 MG PO TABS
650.0000 mg | ORAL_TABLET | ORAL | Status: DC | PRN
  Administered 2019-03-05: 650 mg via ORAL
  Filled 2019-03-05: qty 2

## 2019-03-05 NOTE — ED Notes (Signed)
Pt stated she knows when she needs to urinate; stated she will use call light when she needs to have Purewick applied.

## 2019-03-05 NOTE — H&P (Addendum)
[facility] History and Physical  Ana [name] ZOX:[id number] DOB: [birth year] DOA: 03/05/2019  Referring physician:  PCP: Mast, Man X, NP   Chief Complaint: Stroke  HPI: Ana [name] is a 85 y.o.  WF PMHx dementia, anxiety, Hx of falls, lumbar vertebral fracture, Hx fracture RIGHT humerus, HTN, HLD,  presents to the emergency department for stroke symptoms since 2 days ago. Patient provides HPI as there are no family members at bedside. She reports that she has had weakness in her right arm and right leg for the past 2 days. She reports that she is having trouble finding her words and her words are "not coming out right". She reports that she thinks that she lives at home with her husband but she is not quite sure of this. She denies any current pain, cough, fever, bowel changes. She is able to correctly tell me the date and day of the week.  States lives at [facility] nursing facility with her husband room 305    Review of Systems:  Constitutional:  No weight loss, night sweats, Fevers, chills, fatigue.  HEENT:  No headaches, Difficulty swallowing,Tooth/dental problems,Sore throat,  No sneezing, itching, ear ache, nasal congestion, post nasal drip,  Cardio-vascular:  No chest pain, Orthopnea, PND, swelling in lower extremities, anasarca, dizziness, palpitations  GI:  No heartburn, indigestion, abdominal pain, nausea, vomiting, diarrhea, change in bowel habits, loss of appetite  Resp:  No shortness of breath with exertion or at rest. No excess mucus, no productive cough, No non-productive cough, No coughing up of blood.No change in color of mucus.No wheezing.No chest wall deformity  Skin:  no rash or lesions.  GU:  no dysuria, change in color of urine, no urgency or frequency. No flank pain.  Musculoskeletal:  No joint pain or swelling. No decreased range of motion. No back pain.  Psych:  No change in mood or affect. No depression or anxiety. No memory loss.     Past Medical History:  Diagnosis Date   Allergic rhinitis    Anxiety    Balance problem 04/28/2015   Fatigue 04/28/2015   Hearing loss 04/28/2015   history of Fracture of right humerus 04/28/2015   HLD (hyperlipidemia) 03/05/2019   Hyperglycemia    Hyperlipidemia    Hypertension    Memory loss 04/28/2015   04/26/2014 MMSE 28/30. Failed clock drawing. 04/21/15 MMSE 21/30. Failed clock drawing.    Osteoporosis    Past Surgical History:  Procedure Laterality Date   ABDOMINAL HYSTERECTOMY     CATARACT EXTRACTION  2010   ELBOW SURGERY Right 1988   FEMUR FRACTURE SURGERY Right 06/2014   [place], [place] Dr. Dione Housekeeper   HUMERUS FRACTURE SURGERY Right 2015   [place], [place] Dr. Eber Hong   TONSILLECTOMY  [phone]   Social History:  reports that she has never smoked. She has never used smokeless tobacco. She reports current alcohol use of about 1.0 - 2.0 standard drinks of alcohol per week. She reports that she does not use drugs.  Allergies  Allergen Reactions   Ace Inhibitors    Almond Oil    Plum Pulp     Family History  Problem Relation Age of Onset   Heart disease Mother    Diabetes Daughter       Prior to Admission medications   Medication Sig Start Date End Date Taking? Authorizing Provider  aspirin 325 MG EC tablet Take 325 mg as needed by mouth.     [provider]  calcium carbonate (OSCAL) 1500 (600 Ca) MG TABS tablet Take 600 mg of elemental calcium by mouth daily with breakfast.    [provider]  Cholecalciferol (VITAMIN D3) 1000 UNITS CAPS Take 1,000 Units daily by mouth.     [provider]  diphenhydrAMINE (BENADRYL) 25 MG tablet Take 25 mg by mouth every 6 (six) hours as needed for itching or allergies.    [provider]  donepezil (ARICEPT) 10 MG tablet TAKE ONE TABLET BY MOUTH DAILY TO PRESERVE MEMORY 08/23/18   Oneal Grout, MD  memantine (NAMENDA) 10 MG tablet Take 1 tablet (10 mg total) by mouth  2 (two) times daily. 04/10/18   Oneal Grout, MD  Omega-3 Fatty Acids (FISH OIL) 600 MG CAPS Take 1 capsule by mouth daily. 07/19/18   Oneal Grout, MD     Consultants:    Procedures/Significant Events:  3/23 head CT: Nondiagnostic for acute/subacute CVA. 3/23 MRI brain acute infarct LEFT paramedian pons - Chronic small vessel ischemic changes of the pons 3/23 MRA of the head: Patent internal carotid arteries -Stenosis MCA bifurcation region LT>>>Rt - Vertebral arteries patent     I have personally reviewed and interpreted all radiology studies and my findings are as above.   VENTILATOR SETTINGS: None   Cultures None  Antimicrobials: Anti-infectives (From admission, onward)   None       Devices None  LINES / TUBES:  None    Continuous Infusions:  Physical Exam: Vitals:   03/05/19 1125 03/05/19 1128 03/05/19 1200  BP:  (!) 140/58 (!) 157/68  Pulse:  65   Resp:  14   Temp:  98.1 F (36.7 C)   TempSrc:  Oral   SpO2: 96% 96%     Wt Readings from Last 3 Encounters:  11/29/18 58.3 kg  11/21/18 58.3 kg  07/19/18 59 kg    General: A/O x4, no acute respiratory distress Eyes: negative scleral hemorrhage, negative anisocoria, negative icterus ENT: Negative Runny nose, negative gingival bleeding, Neck:  Negative scars, masses, torticollis, lymphadenopathy, JVD Lungs: Clear to auscultation bilaterally without wheezes or crackles Cardiovascular: Regular rate and rhythm without murmur gallop or rub normal S1 and S2 Abdomen: negative abdominal pain, nondistended, positive soft, bowel sounds, no rebound, no ascites, no appreciable mass Extremities: No significant cyanosis, clubbing, or edema bilateral lower extremities Skin: Negative rashes, lesions, ulcers Psychiatric:  Negative depression, negative anxiety, negative fatigue, negative mania  Central nervous system:  Cranial nerves II through XII intact, tongue/uvula midline, all extremities muscle strength  5/5, sensation intact throughout, finger nose finger left side WNL, RIGHT side past-pointing.  Quick finger touch LEFT side WNL, RIGHT side was unable to perform.  Left heel to shin WNL, RIGHT heel-to-shin unable to perform.  POSITIVE Dysarthria, positive expressive aphasia, negative receptive aphasia.        Labs on Admission:  Basic Metabolic Panel: Recent Labs  Lab 03/05/19 1156  NA 144  K 3.8  CL 109  CO2 26  GLUCOSE 96  BUN 17  CREATININE 0.92  CALCIUM 9.4   Liver Function Tests: Recent Labs  Lab 03/05/19 1156  AST 20  ALT 14  ALKPHOS 49  BILITOT 0.6  PROT 6.2*  ALBUMIN 3.6   No results for input(s): LIPASE, AMYLASE in the last 168 hours. No results for input(s): AMMONIA in the last 168 hours. CBC: Recent Labs  Lab 03/05/19 1156  WBC 9.2  NEUTROABS 5.2  HGB 13.0  HCT 40.0  MCV 99.5  PLT 209  Cardiac Enzymes: No results for input(s): CKTOTAL, CKMB, CKMBINDEX, TROPONINI in the last 168 hours.  BNP (last 3 results) No results for input(s): BNP in the last 8760 hours.  ProBNP (last 3 results) No results for input(s): PROBNP in the last 8760 hours.  CBG: No results for input(s): GLUCAP in the last 168 hours.  Radiological Exams on Admission: Ct Head Wo Contrast  Result Date: 03/05/2019 CLINICAL DATA:  Right-sided weakness, slurred speech EXAM: CT HEAD WITHOUT CONTRAST TECHNIQUE: Contiguous axial images were obtained from the base of the skull through the vertex without intravenous contrast. COMPARISON:  02/28/2018 FINDINGS: Brain: No evidence of acute infarction, hemorrhage, hydrocephalus, extra-axial collection or mass lesion/mass effect. Periventricular white matter hypodensity. Vascular: No hyperdense vessel or unexpected calcification. Skull: Hyperostosis frontalis. Negative for fracture or focal lesion. Sinuses/Orbits: No acute finding. Other: None. IMPRESSION: No acute intracranial pathology.  Small-vessel white matter disease. Electronically Signed    By: Lauralyn Primes M.D.   On: 03/05/2019 12:29    EKG: Independently reviewed.   Assessment/Plan Active Problems:   Anxiety   Hyperlipidemia   Hypertension   history of Fracture of right humerus   Memory loss   Balance problem   History of Fracture of right lower leg   Lumbar vertebral fracture (HCC)   Prediabetes   HLD (hyperlipidemia)  Acute CVA - Acute CVA LEFT pons - Echocardiogram pending - Maintain SBP 140-180 - Consult stroke team in the a.m. - PT/OT consulted  Essential HTN - Will allow permissive HTN secondary to CVA -Maintain SBP 140-180 - Hydralazine 5 mg PRN SBP> 190  HLD - Lipid panel pending - Lipitor 40 mg daily once patient passes swallow evaluation  Prediabetes - Hemoglobin A1c pending - Glucose currently WNL  Multiple fractures -Per patient have not been an issue.  Will await PT/OT assessment  Dementia - Continue home meds donezepil 10 mg daily, memantine 10 mg twice daily    Code Status: DNR (DVT Prophylaxis: Lovenox Family Communication: None Disposition Plan: TBD: Patient came from [facility] nursing facility with her husband room 305   Data Reviewed: Care during the described time interval was provided by me .  I have reviewed this patient's available data, including medical history, events of note, physical examination, and all test results as part of my evaluation.   Time spent: 60 min  Avion Patella, Roselind Messier [facility] Pager [phone]

## 2019-03-05 NOTE — Progress Notes (Signed)
Patient admitted to unit 3w-21

## 2019-03-05 NOTE — ED Notes (Signed)
Patient transported to CT 

## 2019-03-05 NOTE — ED Notes (Signed)
Pt daughter Jules Schick is POA and would like an update when possible [phone]

## 2019-03-05 NOTE — Progress Notes (Signed)
PT Cancellation Note  Patient Details Name: Ana [name] MRN: [id number] DOB: 1926-10-09   Cancelled Treatment:    Reason Eval/Treat Not Completed: Active bedrest order Pt on bed rest, being worked up for stroke.  PT will check back tomorrow.  Thanks,  Rollene Rotunda. Gatha Mcnulty, PT, DPT  Acute Rehabilitation [phone] pager [phone]) [phone] office    Lurena Joiner B Zoran Yankee 03/05/2019, 3:05 PM

## 2019-03-05 NOTE — ED Notes (Signed)
ED Provider at bedside. 

## 2019-03-05 NOTE — Progress Notes (Signed)
Obtained report, patient coming to 3w-21

## 2019-03-05 NOTE — ED Triage Notes (Signed)
Pt arrives from [facility] via [place] for right sided weakness and slurred speech that started on Saturday. Per EMS, pt sent here for diagnosis so pt can transition to a full care facility. Pt denies pain. Pt alert and oriented.

## 2019-03-05 NOTE — ED Notes (Signed)
Attempted report 

## 2019-03-05 NOTE — ED Notes (Signed)
ED TO INPATIENT HANDOFF REPORT  ED Nurse Name and Phone #: Shedric Fredericks [phone]  S Name/Age/Gender Ana [name] 85 y.o. female Room/Bed: 042C/042C  Code Status   Code Status: DNR  Home/SNF/Other Nursing Home Patient oriented to: self, place, time and situation Is this baseline? Yes   Triage Complete: Triage complete  Chief Complaint stroke  Triage Note Pt arrives from [facility] via [place] for right sided weakness and slurred speech that started on Saturday. Per EMS, pt sent here for diagnosis so pt can transition to a full care facility. Pt denies pain. Pt alert and oriented.    Allergies Allergies  Allergen Reactions  . Ace Inhibitors   . Almond Oil   . Plum Pulp     Level of Care/Admitting Diagnosis ED Disposition    ED Disposition Condition Comment   Admit  Hospital Area: [facility] [[id number]]  Level of Care: Progressive [102]  Diagnosis: Stroke (cerebrum) [facility]) [[id number]]  Admitting Physician: Drema Dallas [[id number]]  Attending Physician: Drema Dallas [[id number]]  Estimated length of stay: 3 - 4 days  Certification:: I certify this patient will need inpatient services for at least 2 midnights  PT Class (Do Not Modify): Inpatient [101]  PT Acc Code (Do Not Modify): Private [1]       B Medical/Surgery History Past Medical History:  Diagnosis Date  . Allergic rhinitis   . Anxiety   . Balance problem 04/28/2015  . Fatigue 04/28/2015  . Hearing loss 04/28/2015  . history of Fracture of right humerus 04/28/2015  . HLD (hyperlipidemia) 03/05/2019  . Hyperglycemia   . Hyperlipidemia   . Hypertension   . Memory loss 04/28/2015   04/26/2014 MMSE 28/30. Failed clock drawing. 04/21/15 MMSE 21/30. Failed clock drawing.   . Osteoporosis    Past Surgical History:  Procedure Laterality Date  . ABDOMINAL HYSTERECTOMY    . CATARACT EXTRACTION  2010  . ELBOW SURGERY Right 1988  . FEMUR FRACTURE SURGERY Right 06/2014   [place], [place] Dr. Dione Housekeeper  . HUMERUS FRACTURE SURGERY Right 2015   [place], [place] Dr. Eber Hong  . TONSILLECTOMY  1935     A IV Location/Drains/Wounds Patient Lines/Drains/Airways Status   Active Line/Drains/Airways    Name:   Placement date:   Placement time:   Site:   Days:   Peripheral IV 03/05/19 Left Wrist   03/05/19    1125    Wrist   less than 1   External Urinary Catheter   03/05/19    1421    -   less than 1          Intake/Output Last 24 hours No intake or output data in the 24 hours ending 03/05/19 1517  Labs/Imaging Results for orders placed or performed during the hospital encounter of 03/05/19 (from the past 48 hour(s))  Ethanol     Status: None   Collection Time: 03/05/19 11:56 AM  Result Value Ref Range   Alcohol, Ethyl (B) <10 <10 mg/dL    Comment: (NOTE) Lowest detectable limit for serum alcohol is 10 mg/dL. For medical purposes only. Performed at [facility], [address], [place], [place] [address]   Protime-INR     Status: None   Collection Time: 03/05/19 11:56 AM  Result Value Ref Range   Prothrombin Time 13.3 11.4 - 15.2 seconds   INR 1.0 0.8 - 1.2    Comment: (NOTE) INR goal varies based on device and disease  states. Performed at [facility], [address] [place], [place] [address]   APTT     Status: None   Collection Time: 03/05/19 11:56 AM  Result Value Ref Range   aPTT 30 24 - 36 seconds    Comment: Performed at [facility], [address] [place], [place] [address]  CBC     Status: None   Collection Time: 03/05/19 11:56 AM  Result Value Ref Range   WBC 9.2 4.0 - 10.5 K/uL   RBC 4.02 3.87 - 5.11 MIL/uL   Hemoglobin 13.0 12.0 - 15.0 g/dL   HCT 24.440.0 01.036.0 - 27.246.0 %   MCV 99.5 80.0 - 100.0 fL   MCH 32.3 26.0 - 34.0 pg   MCHC 32.5 30.0 - 36.0 g/dL   RDW 53.613.0 64.411.5 - 03.415.5 %   Platelets 209 150 - 400 K/uL   nRBC 0.0 0.0 - 0.2 %    Comment: Performed at [facility], [address] [place], [place] [address]   Differential     Status: None   Collection Time: 03/05/19 11:56 AM  Result Value Ref Range   Neutrophils Relative % 56 %   Neutro Abs 5.2 1.7 - 7.7 K/uL   Lymphocytes Relative 34 %   Lymphs Abs 3.1 0.7 - 4.0 K/uL   Monocytes Relative 8 %   Monocytes Absolute 0.7 0.1 - 1.0 K/uL   Eosinophils Relative 1 %   Eosinophils Absolute 0.1 0.0 - 0.5 K/uL   Basophils Relative 1 %   Basophils Absolute 0.1 0.0 - 0.1 K/uL   Immature Granulocytes 0 %   Abs Immature Granulocytes 0.03 0.00 - 0.07 K/uL    Comment: Performed at [facility], [address] [place], [place] [address]  Comprehensive metabolic panel     Status: Abnormal   Collection Time: 03/05/19 11:56 AM  Result Value Ref Range   Sodium 144 135 - 145 mmol/L   Potassium 3.8 3.5 - 5.1 mmol/L   Chloride 109 98 - 111 mmol/L   CO2 26 22 - 32 mmol/L   Glucose, Bld 96 70 - 99 mg/dL   BUN 17 8 - 23 mg/dL   Creatinine, Ser 5.640.92 0.44 - 1.00 mg/dL   Calcium 9.4 8.9 - 33.210.3 mg/dL   Total Protein 6.2 (L) 6.5 - 8.1 g/dL   Albumin 3.6 3.5 - 5.0 g/dL   AST 20 15 - 41 U/L   ALT 14 0 - 44 U/L   Alkaline Phosphatase 49 38 - 126 U/L   Total Bilirubin 0.6 0.3 - 1.2 mg/dL   GFR calc non Af Amer 55 (L) >60 mL/min   GFR calc Af Amer >60 >60 mL/min   Anion gap 9 5 - 15    Comment: Performed at [facility], [address] [place], [place] [address]   Ct Head Wo Contrast  Result Date: 03/05/2019 CLINICAL DATA:  Right-sided weakness, slurred speech EXAM: CT HEAD WITHOUT CONTRAST TECHNIQUE: Contiguous axial images were obtained from the base of the skull through the vertex without intravenous contrast. COMPARISON:  02/28/2018 FINDINGS: Brain: No evidence of acute infarction, hemorrhage, hydrocephalus, extra-axial collection or mass lesion/mass effect. Periventricular white matter hypodensity. Vascular: No hyperdense vessel or unexpected calcification. Skull: Hyperostosis frontalis. Negative for fracture or focal lesion. Sinuses/Orbits: No  acute finding. Other: None. IMPRESSION: No acute intracranial pathology.  Small-vessel white matter disease. Electronically Signed   By: Lauralyn PrimesAlex  Bibbey M.D.   On: 03/05/2019  12:29    Pending Labs Unresulted Labs (From admission, onward)    Start     Ordered   03/12/19 0500  Creatinine, serum  (enoxaparin (LOVENOX)    CrCl >/= 30 ml/min)  Weekly,   R    Comments:  while on enoxaparin therapy    03/05/19 1455   03/06/19 0500  Hemoglobin A1c  Tomorrow morning,   R     03/05/19 1455   03/06/19 0500  Lipid panel  Tomorrow morning,   R    Comments:  Fasting    03/05/19 1455   03/06/19 0500  Comprehensive metabolic panel  Tomorrow morning,   R     03/05/19 1455   03/05/19 1447  CBC  (enoxaparin (LOVENOX)    CrCl >/= 30 ml/min)  Once,   R    Comments:  Baseline for enoxaparin therapy IF NOT ALREADY DRAWN.  Notify MD if PLT < 100 K.    03/05/19 1455   03/05/19 1447  Creatinine, serum  (enoxaparin (LOVENOX)    CrCl >/= 30 ml/min)  Once,   R    Comments:  Baseline for enoxaparin therapy IF NOT ALREADY DRAWN.    03/05/19 1455   03/05/19 1134  Urine rapid drug screen (hosp performed)  ONCE - STAT,   STAT     03/05/19 1143   03/05/19 1134  Urinalysis, Routine w reflex microscopic  ONCE - STAT,   STAT     03/05/19 1143          Vitals/Pain Today's Vitals   03/05/19 1125 03/05/19 1128 03/05/19 1142 03/05/19 1200  BP:  (!) 140/58  (!) 157/68  Pulse:  65    Resp:  14    Temp:  98.1 F (36.7 C)    TempSrc:  Oral    SpO2: 96% 96%    PainSc:  0-No pain 0-No pain     Isolation Precautions No active isolations  Medications Medications   stroke: mapping our early stages of recovery book (has no administration in time range)  0.9 %  sodium chloride infusion (has no administration in time range)  acetaminophen (TYLENOL) tablet 650 mg (has no administration in time range)    Or  acetaminophen (TYLENOL) solution 650 mg (has no administration in time range)    Or  acetaminophen (TYLENOL)  suppository 650 mg (has no administration in time range)  senna-docusate (Senokot-S) tablet 1 tablet (has no administration in time range)  enoxaparin (LOVENOX) injection 40 mg (has no administration in time range)    Mobility non-ambulatory     Focused Assessments Neuro Assessment Handoff:  Swallow screen pass? not completed at this time   NIH Stroke Scale ( + Modified Stroke Scale Criteria)  Interval: Initial Level of Consciousness (1a.)   : Alert, keenly responsive LOC Questions (1b. )   +: Answers both questions correctly LOC Commands (1c. )   + : Performs both tasks correctly Best Gaze (2. )  +: Normal Visual (3. )  +: Partial hemianopia Motor Arm, Left (5a. )   +: No drift Motor Arm, Right (5b. )   +: Some effort against gravity Motor Leg, Left (6a. )   +: No drift Motor Leg, Right (6b. )   +: No effort against gravity Limb Ataxia (7. ): Present in one limb Sensory (8. )   +: Normal, no sensory loss Best Language (9. )   +: Severe aphasia Dysarthria (10. ): Mild-to-moderate dysarthria, patient slurs at least some words and,  at worst, can be understood with some difficulty Extinction/Inattention (11.)   +: No Abnormality Modified SS Total  +: 8     Neuro Assessment:   Neuro Checks:   Initial (03/05/19 1137)  Last Documented NIHSS Modified Score: 8 (03/05/19 1505) Has TPA been given? No If patient is a Neuro Trauma and patient is going to OR before floor call report to 4N Charge nurse: [phone] or [phone]     R Recommendations: See Admitting Provider Note  Report given to:   Additional Notes:

## 2019-03-05 NOTE — ED Notes (Signed)
Admitting provider at bedside.

## 2019-03-05 NOTE — ED Provider Notes (Signed)
[facility] EMERGENCY DEPARTMENT Provider Note   CSN: [id number] Arrival date & time: 03/05/19  1124    History   Chief Complaint Chief Complaint  Patient presents with  . Weakness  . Aphasia    HPI Ana [name] is a 85 y.o. female.     Patient is a 85 year old female with past medical history of dementia, CKD, hypertension, osteoporosis who presents to the emergency department for stroke symptoms since 2 days ago.  Patient provides HPI as there are no family members at bedside.  She reports that she has had weakness in her right arm and right leg for the past 2 days.  She reports that she is having trouble finding her words and her words are "not coming out right".  She reports that she thinks that she lives at home with her husband but she is not quite sure of this.  She denies any current pain, cough, fever, bowel changes.  She is able to correctly tell me the date and day of the week.     Past Medical History:  Diagnosis Date  . Allergic rhinitis   . Anxiety   . Balance problem 04/28/2015  . Fatigue 04/28/2015  . Hearing loss 04/28/2015  . history of Fracture of right humerus 04/28/2015  . Hyperglycemia   . Hyperlipidemia   . Hypertension   . Memory loss 04/28/2015   04/26/2014 MMSE 28/30. Failed clock drawing. 04/21/15 MMSE 21/30. Failed clock drawing.   . Osteoporosis     Patient Active Problem List   Diagnosis Date Noted  . Prediabetes 02/15/2018  . Lumbar vertebral fracture (HCC) 10/09/2017  . Stress incontinence 09/07/2017  . Nocturia 08/24/2016  . Ganglion cyst 02/17/2016  . Eczema 02/17/2016  . history of Fracture of right humerus 04/28/2015  . Memory loss 04/28/2015  . Hearing loss 04/28/2015  . Balance problem 04/28/2015  . Varicose veins 04/28/2015  . History of Fracture of right lower leg 04/28/2015  . Allergy   . Allergic rhinitis   . Osteoporosis   . Anxiety   . Hyperlipidemia   . Hypertension     Past Surgical  History:  Procedure Laterality Date  . ABDOMINAL HYSTERECTOMY    . CATARACT EXTRACTION  2010  . ELBOW SURGERY Right 1988  . FEMUR FRACTURE SURGERY Right 06/2014   [place], [place] Dr. Dione Housekeeper  . HUMERUS FRACTURE SURGERY Right 2015   [place], [place] Dr. Eber Hong  . TONSILLECTOMY  1935     OB History   No obstetric history on file.      Home Medications    Prior to Admission medications   Medication Sig Start Date End Date Taking? Authorizing Provider  aspirin 325 MG EC tablet Take 325 mg as needed by mouth.     [provider]  calcium carbonate (OSCAL) 1500 (600 Ca) MG TABS tablet Take 600 mg of elemental calcium by mouth daily with breakfast.    [provider]  Cholecalciferol (VITAMIN D3) 1000 UNITS CAPS Take 1,000 Units daily by mouth.     [provider]  diphenhydrAMINE (BENADRYL) 25 MG tablet Take 25 mg by mouth every 6 (six) hours as needed for itching or allergies.    [provider]  donepezil (ARICEPT) 10 MG tablet TAKE ONE TABLET BY MOUTH DAILY TO PRESERVE MEMORY 08/23/18   Oneal Grout, MD  memantine (NAMENDA) 10 MG tablet Take 1 tablet (10 mg total) by mouth 2 (two) times daily. 04/10/18   Glade Lloyd,  Mahima, MD  Omega-3 Fatty Acids (FISH OIL) 600 MG CAPS Take 1 capsule by mouth daily. 07/19/18   Oneal Grout, MD    Family History Family History  Problem Relation Age of Onset  . Heart disease Mother   . Diabetes Daughter     Social History Social History   Tobacco Use  . Smoking status: Never Smoker  . Smokeless tobacco: Never Used  Substance Use Topics  . Alcohol use: Yes    Alcohol/week: 1.0 - 2.0 standard drinks    Types: 1 - 2 Glasses of wine per week    Comment: 1-2 glasses 1-2 times a week  . Drug use: No     Allergies   Ace inhibitors; Almond oil; and Plum pulp   Review of Systems Review of Systems  Constitutional: Negative for activity change, appetite change, fatigue and fever.  Eyes: Negative for  photophobia and visual disturbance.  Respiratory: Negative for cough and shortness of breath.   Cardiovascular: Negative for chest pain.  Gastrointestinal: Negative for nausea and vomiting.  Genitourinary: Negative for dysuria.  Musculoskeletal: Positive for gait problem. Negative for arthralgias, back pain, joint swelling and neck stiffness.  Skin: Negative for rash and wound.  Neurological: Positive for speech difficulty, weakness and numbness. Negative for dizziness, syncope, light-headedness and headaches.     Physical Exam Updated Vital Signs BP (!) 140/58   Pulse 65   Temp 98.1 F (36.7 C) (Oral)   Resp 14   SpO2 96%   Physical Exam Vitals signs and nursing note reviewed.  Constitutional:      General: She is not in acute distress.    Appearance: She is not ill-appearing, toxic-appearing or diaphoretic.  HENT:     Head: Normocephalic and atraumatic.     Nose: Nose normal.     Mouth/Throat:     Pharynx: Oropharynx is clear.  Eyes:     Conjunctiva/sclera: Conjunctivae normal.  Cardiovascular:     Rate and Rhythm: Rhythm irregular.  Pulmonary:     Effort: Pulmonary effort is normal.     Breath sounds: Normal breath sounds.  Abdominal:     General: Abdomen is flat.  Skin:    General: Skin is warm.  Neurological:     Mental Status: She is alert and oriented to person, place, and time.     Cranial Nerves: Dysarthria and facial asymmetry present.     Motor: Weakness present.     Comments: Very mild right side mouth droop. Significant weakness in the RUE and RLE 2/5      ED Treatments / Results  Labs (all labs ordered are listed, but only abnormal results are displayed) Labs Reviewed  ETHANOL  PROTIME-INR  APTT  CBC  DIFFERENTIAL  COMPREHENSIVE METABOLIC PANEL  RAPID URINE DRUG SCREEN, HOSP PERFORMED  URINALYSIS, ROUTINE W REFLEX MICROSCOPIC    EKG None  Radiology No results found.  Procedures Procedures (including critical care time)   Medications Ordered in ED Medications - No data to display   Initial Impression / Assessment and Plan / ED Course  I have reviewed the triage vital signs and the nursing notes.  Pertinent labs & imaging results that were available during my care of the patient were reviewed by me and considered in my medical decision making (see chart for details).          Final Clinical Impressions(s) / ED Diagnoses   Final diagnoses:  None    ED Discharge Orders    None  Jeral Pinch 03/05/19 2052    Wynetta Fines, MD 03/27/19 1455

## 2019-03-06 ENCOUNTER — Inpatient Hospital Stay (HOSPITAL_COMMUNITY): Payer: Medicare Other

## 2019-03-06 ENCOUNTER — Other Ambulatory Visit (HOSPITAL_COMMUNITY): Payer: Medicare Other

## 2019-03-06 DIAGNOSIS — I6302 Cerebral infarction due to thrombosis of basilar artery: Secondary | ICD-10-CM

## 2019-03-06 DIAGNOSIS — E7849 Other hyperlipidemia: Secondary | ICD-10-CM

## 2019-03-06 DIAGNOSIS — I639 Cerebral infarction, unspecified: Secondary | ICD-10-CM

## 2019-03-06 LAB — COMPREHENSIVE METABOLIC PANEL
ALT: 13 U/L (ref 0–44)
AST: 18 U/L (ref 15–41)
Albumin: 3.2 g/dL — ABNORMAL LOW (ref 3.5–5.0)
Alkaline Phosphatase: 43 U/L (ref 38–126)
Anion gap: 9 (ref 5–15)
BUN: 17 mg/dL (ref 8–23)
CHLORIDE: 107 mmol/L (ref 98–111)
CO2: 26 mmol/L (ref 22–32)
CREATININE: 1.03 mg/dL — AB (ref 0.44–1.00)
Calcium: 9.4 mg/dL (ref 8.9–10.3)
GFR calc Af Amer: 55 mL/min — ABNORMAL LOW (ref >60)
GFR, EST NON AFRICAN AMERICAN: 48 mL/min — AB (ref >60)
Glucose, Bld: 98 mg/dL (ref 70–99)
Potassium: 3.9 mmol/L (ref 3.5–5.1)
Sodium: 142 mmol/L (ref 135–145)
Total Bilirubin: 1 mg/dL (ref 0.3–1.2)
Total Protein: 5.7 g/dL — ABNORMAL LOW (ref 6.5–8.1)

## 2019-03-06 LAB — HEMOGLOBIN A1C
Hgb A1c MFr Bld: 5.9 % — ABNORMAL HIGH (ref 4.8–5.6)
Mean Plasma Glucose: 122.63 mg/dL

## 2019-03-06 LAB — LIPID PANEL
Cholesterol: 234 mg/dL — ABNORMAL HIGH (ref 0–200)
HDL: 37 mg/dL — ABNORMAL LOW (ref >40)
LDL CALC: 175 mg/dL — AB (ref 0–99)
Total CHOL/HDL Ratio: 6.3 RATIO
Triglycerides: 110 mg/dL (ref <150)
VLDL: 22 mg/dL (ref 0–40)

## 2019-03-06 LAB — ECHOCARDIOGRAM COMPLETE

## 2019-03-06 MED ORDER — ASPIRIN 325 MG PO TABS
325.0000 mg | ORAL_TABLET | Freq: Every day | ORAL | Status: DC
  Administered 2019-03-06 – 2019-03-07 (×2): 325 mg via ORAL
  Filled 2019-03-06 (×2): qty 1

## 2019-03-06 NOTE — Progress Notes (Signed)
OT Cancellation Note  Patient Details Name: Ana [name] MRN: [id number] DOB: 08-01-1926   Cancelled Treatment:    Reason Eval/Treat Not Completed: Active bedrest order, will follow and initiate OT eval when appropriate and able.   Chancy Milroy, OT Acute Rehabilitation Services Pager [phone] Office [phone]    Chancy Milroy 03/06/2019, 7:46 AM

## 2019-03-06 NOTE — Progress Notes (Signed)
PT Cancellation Note  Patient Details Name: Ana [name] MRN: [id number] DOB: 1926-01-29   Cancelled Treatment:    Reason Eval/Treat Not Completed: Active bedrest order Will await increase in activity order prior to PT evaluation.   [name] [name] 03/06/2019, 7:15 AM Mylo Red, PT, DPT Acute Rehabilitation Services Pager [phone] Office [phone]

## 2019-03-06 NOTE — Progress Notes (Signed)
Rehab Admissions Coordinator Note:  Patient was screened by Clois Dupes for appropriateness for an Inpatient Acute Rehab Consult per OT recs.   At this time, we are recommending Inpatient Rehab consult.  Clois Dupes RN MSN 03/06/2019, 11:15 AM  I can be reached at [phone].

## 2019-03-06 NOTE — Progress Notes (Signed)
  Echocardiogram 2D Echocardiogram has been performed.  Delcie Roch 03/06/2019, 11:38 AM

## 2019-03-06 NOTE — Progress Notes (Signed)
Carotid duplex has been completed.   Preliminary results in CV Proc.   Blanch Media 03/06/2019 1:06 PM

## 2019-03-06 NOTE — Evaluation (Signed)
Speech Language Pathology Evaluation Patient Details Name: Ana [name] MRN: [id number] DOB: 08/01/1926 Today's Date: 03/06/2019 Time: 0910-0940 SLP Time Calculation (min) (ACUTE ONLY): 30 min  Problem List:  Patient Active Problem List   Diagnosis Date Noted  . HLD (hyperlipidemia) 03/05/2019  . Stroke (cerebrum) (HCC) 03/05/2019  . Prediabetes 02/15/2018  . Lumbar vertebral fracture (HCC) 10/09/2017  . Stress incontinence 09/07/2017  . Nocturia 08/24/2016  . Ganglion cyst 02/17/2016  . Eczema 02/17/2016  . history of Fracture of right humerus 04/28/2015  . Memory loss 04/28/2015  . Hearing loss 04/28/2015  . Balance problem 04/28/2015  . Varicose veins 04/28/2015  . History of Fracture of right lower leg 04/28/2015  . Allergy   . Allergic rhinitis   . Osteoporosis   . Anxiety   . Hyperlipidemia   . Hypertension    Past Medical History:  Past Medical History:  Diagnosis Date  . Allergic rhinitis   . Anxiety   . Balance problem 04/28/2015  . Fatigue 04/28/2015  . Hearing loss 04/28/2015  . history of Fracture of right humerus 04/28/2015  . HLD (hyperlipidemia) 03/05/2019  . Hyperglycemia   . Hyperlipidemia   . Hypertension   . Memory loss 04/28/2015   04/26/2014 MMSE 28/30. Failed clock drawing. 04/21/15 MMSE 21/30. Failed clock drawing.   . Osteoporosis    Past Surgical History:  Past Surgical History:  Procedure Laterality Date  . ABDOMINAL HYSTERECTOMY    . CATARACT EXTRACTION  2010  . ELBOW SURGERY Right 1988  . FEMUR FRACTURE SURGERY Right 06/2014   [place], [place] Dr. Dione Housekeeper  . HUMERUS FRACTURE SURGERY Right 2015   [place], [place] Dr. Eber Hong  . TONSILLECTOMY  1935   HPI:  Patient is a 85 y.o. female with PMH: demetia, anxiety, falls, lumbar fracture, right humerus fracture, HTN, who presented to ED with two days of stroke symptoms including weakness in UE/LE, difficulty with word finding. MRI revealed acute infarctions of left para median  pons.    Assessment / Plan / Recommendation Clinical Impression  Patient presents with a mild dysarthria and a moderate cognitive-linguistic impairment. Speech intelligibilty at sentence and conversational levels are reduced, but this appears to be more related to phonemic paraphasias that patient is exhibiting. Patient is aware of phonemic paraphasias and does attempt to self-correct but is not consistently successful and required open-ended question and sentence/phrase completion cues. SLP administered the [facility] Cognitive Assessment  [facility]) and received a score of 14 (normal is 26 and above, impaired is less than 26), which is supported by her previous diagnosis of dementia. As per this testing, patient is in the moderate-severe range for cognitive impairment. Primary deficits include: memory storage deficit, difficulty with delayed recall, phonemic paraphasias with awareness, mild dysarthria impacted by expressive language deficit, word-finding/naming difficulties. She demonstrated good attention, awareness, orientation, comprehension.     SLP Assessment  SLP Recommendation/Assessment: All further Speech Lanaguage Pathology  needs can be addressed in the next venue of care SLP Visit Diagnosis: Cognitive communication deficit (R41.841);Dysarthria and anarthria (R47.1)    Follow Up Recommendations  Skilled Nursing facility    Frequency and Duration   N/A at this venue        SLP Evaluation Cognition  Overall Cognitive Status: No family/caregiver present to determine baseline cognitive functioning Arousal/Alertness: Awake/alert Orientation Level: Oriented to person;Oriented to place;Oriented to situation;Other (comment)(oriented to month and year, day, stated date as "19th") Memory: Impaired Memory Impairment: Storage deficit Awareness: Appears intact Problem Solving: Appears intact  Executive Function: Self Monitoring;Self Correcting Self Monitoring: Appears intact Self Correcting:  Impaired Self Correcting Impairment: Verbal basic Safety/Judgment: Appears intact       Comprehension  Auditory Comprehension Overall Auditory Comprehension: Impaired Yes/No Questions: Within Functional Limits Commands: Within Functional Limits Conversation: Simple EffectiveTechniques: Repetition;Extra processing time Reading Comprehension Reading Status: Not tested    Expression Expression Primary Mode of Expression: Verbal Verbal Expression Overall Verbal Expression: Impaired Level of Generative/Spontaneous Verbalization: Sentence;Conversation Repetition: Impaired Level of Impairment: Sentence level Naming: Impairment Responsive: 76-100% accurate Convergent: 75-100% accurate Divergent: 50-74% accurate Verbal Errors: Aware of errors;Phonemic paraphasias Pragmatics: No impairment Effective Techniques: Semantic cues;Open ended questions Non-Verbal Means of Communication: Not applicable Written Expression Dominant Hand: Right   Oral / Motor  Oral Motor/Sensory Function Overall Oral Motor/Sensory Function: Mild impairment Facial ROM: Reduced right Facial Symmetry: Abnormal symmetry right Facial Strength: Reduced right Facial Sensation: Within Functional Limits Lingual ROM: Within Functional Limits Lingual Symmetry: Within Functional Limits Lingual Strength: Within Functional Limits Lingual Sensation: Within Functional Limits Velum: Within Functional Limits Mandible: Within Functional Limits Motor Speech Overall Motor Speech: Impaired Respiration: Within functional limits Phonation: Normal Resonance: Within functional limits Articulation: Impaired Level of Impairment: Sentence Intelligibility: Intelligibility reduced Word: 75-100% accurate Phrase: 75-100% accurate Sentence: 75-100% accurate Conversation: 50-74% accurate Motor Planning: Witnin functional limits Motor Speech Errors: Not applicable   GO                    Angela Nevin, MA, CCC-SLP 03/06/19  2:32 PM

## 2019-03-06 NOTE — Progress Notes (Signed)
[facility] PROGRESS NOTE  Ana [name] CXK:[id number] DOB: 01-03-1926 DOA: 03/05/2019  PCP: Mast, Man X, NP  Brief History/Interval Summary: 85 year old Caucasian female with a past medical history of dementia, anxiety disorder, hypertension, hyperlipidemia presented with complaints of weakness in the right arm and right leg ongoing for 2 days.  She also mentions difficulty speaking, trouble finding words and words not coming out right.  She apparently lives at friend's home [place] nursing facility although level of care is not known.  Apparently her husband is also in the same facility.  Reason for Visit: Acute stroke  Consultants: Neurology has been consulted today  Procedures: Transthoracic echocardiogram is pending  Antibiotics: None  Subjective/Interval History: Patient continues to have difficulty speaking.  She continues to have weakness in her right arm and leg.  Denies any chest pain or shortness of breath  ROS: Denies any nausea or vomiting  Objective:  Vital Signs  Vitals:   03/05/19 1942 03/06/19 0052 03/06/19 0411 03/06/19 0805  BP: (!) 159/75 (!) 171/76 (!) 147/53 97/69  Pulse: 74 63 (!) 57 (!) 57  Resp: 20 18 18 16   Temp: 98.7 F (37.1 C) 97.9 F (36.6 C) 97.6 F (36.4 C) 97.7 F (36.5 C)  TempSrc: Oral Oral Oral Oral  SpO2: 97% 94% 97% 96%   No intake or output data in the 24 hours ending 03/06/19 1002 There were no vitals filed for this visit.  General appearance: alert, cooperative, appears stated age and no distress Head: Normocephalic, without obvious abnormality, atraumatic Resp: clear to auscultation bilaterally Cardio: regular rate and rhythm, S1, S2 normal, no murmur, click, rub or gallop GI: soft, non-tender; bowel sounds normal; no masses,  no organomegaly Extremities: extremities normal, atraumatic, no cyanosis or edema Pulses: 2+ and symmetric Neurologic: Awake alert.  Noted to have dysarthria.  Noted to have right-sided  weakness upper and lower extremities.  3 out of 5.  Lab Results:  Data Reviewed: I have personally reviewed following labs and imaging studies  CBC: Recent Labs  Lab 03/05/19 1156  WBC 9.2  NEUTROABS 5.2  HGB 13.0  HCT 40.0  MCV 99.5  PLT 209    Basic Metabolic Panel: Recent Labs  Lab 03/05/19 1156 03/06/19 0542  NA 144 142  K 3.8 3.9  CL 109 107  CO2 26 26  GLUCOSE 96 98  BUN 17 17  CREATININE 0.92 1.03*  CALCIUM 9.4 9.4    GFR: CrCl cannot be calculated (Unknown ideal weight.).  Liver Function Tests: Recent Labs  Lab 03/05/19 1156 03/06/19 0542  AST 20 18  ALT 14 13  ALKPHOS 49 43  BILITOT 0.6 1.0  PROT 6.2* 5.7*  ALBUMIN 3.6 3.2*    Coagulation Profile: Recent Labs  Lab 03/05/19 1156  INR 1.0    HbA1C: Recent Labs    03/06/19 0542  HGBA1C 5.9*    Lipid Profile: Recent Labs    03/06/19 0542  CHOL 234*  HDL 37*  LDLCALC 175*  TRIG 110  CHOLHDL 6.3     Radiology Studies: Dg Chest 2 View  Result Date: 03/05/2019 CLINICAL DATA:  Acute CVA EXAM: CHEST - 2 VIEW COMPARISON:  January 20, 2018 FINDINGS: There is no appreciable edema or consolidation. Heart is borderline enlarged with pulmonary vascularity normal. No adenopathy. There is aortic atherosclerosis. There is degenerative change in the thoracic spine. IMPRESSION: Line cardiac enlargement. No edema or consolidation. Aortic Atherosclerosis (ICD10-I70.0). Electronically Signed   By: Bretta Bang III M.D.  On: 03/05/2019 18:04   Ct Head Wo Contrast  Result Date: 03/05/2019 CLINICAL DATA:  Right-sided weakness, slurred speech EXAM: CT HEAD WITHOUT CONTRAST TECHNIQUE: Contiguous axial images were obtained from the base of the skull through the vertex without intravenous contrast. COMPARISON:  02/28/2018 FINDINGS: Brain: No evidence of acute infarction, hemorrhage, hydrocephalus, extra-axial collection or mass lesion/mass effect. Periventricular white matter hypodensity. Vascular: No  hyperdense vessel or unexpected calcification. Skull: Hyperostosis frontalis. Negative for fracture or focal lesion. Sinuses/Orbits: No acute finding. Other: None. IMPRESSION: No acute intracranial pathology.  Small-vessel white matter disease. Electronically Signed   By: Lauralyn Primes M.D.   On: 03/05/2019 12:29   Mr Brain Wo Contrast  Result Date: 03/05/2019 CLINICAL DATA:  Focal neurological deficit. Right sided weakness. Slurred speech. EXAM: MRI HEAD WITHOUT CONTRAST MRA HEAD WITHOUT CONTRAST TECHNIQUE: Multiplanar, multiecho pulse sequences of the brain and surrounding structures were obtained without intravenous contrast. Angiographic images of the head were obtained using MRA technique without contrast. COMPARISON:  Head CT earlier same day FINDINGS: MRI HEAD FINDINGS Brain: Acute infarction of the left para median pons. No other acute infarction. No swelling or hemorrhage. Elsewhere, there chronic small-vessel ischemic changes of the pons. No focal cerebellar insult. Cerebral hemispheres show moderate chronic small-vessel ischemic changes of the deep and subcortical white matter. No mass lesion, acute hemorrhage, hydrocephalus or extra-axial collection. There is some old hemosiderin deposition along the surface of the brain in the right parieto-occipital region. Vascular: Major vessels at the base of the brain show flow. Skull and upper cervical spine: Negative Sinuses/Orbits: Clear/normal Other: None MRA HEAD FINDINGS Both internal carotid arteries are patent through the skull base and siphon regions. No flow limiting siphon stenosis suspected. The anterior and middle cerebral vessels are patent. There are stenoses in the MCA bifurcation regions, worse on the left than the right. Mild stenoses are seen in the A1 segments. Both vertebral arteries are patent to the basilar. There is mild atherosclerotic change of the basilar artery. There is a patent left posterior inferior cerebellar artery. There is a  patent right anterior inferior cerebellar artery. Both superior cerebellar arteries show flow. Both posterior cerebral arteries are patent, though there are serial stenoses, with diminished visualization of the more distal branch vessels. IMPRESSION: Acute infarction of the left para median pons. No swelling or hemorrhage in that location. Chronic small-vessel ischemic changes elsewhere within the pons and within the hemispheric white matter. MR angiography shows extensive intracranial atherosclerotic disease, with stenoses in the MCA bifurcation regions left more than right and of the posterior cerebral arteries on both sides. No large vessel occlusion identified. Electronically Signed   By: Paulina Fusi M.D.   On: 03/05/2019 17:54   Mr Maxine Glenn Head Wo Contrast  Result Date: 03/05/2019 CLINICAL DATA:  Focal neurological deficit. Right sided weakness. Slurred speech. EXAM: MRI HEAD WITHOUT CONTRAST MRA HEAD WITHOUT CONTRAST TECHNIQUE: Multiplanar, multiecho pulse sequences of the brain and surrounding structures were obtained without intravenous contrast. Angiographic images of the head were obtained using MRA technique without contrast. COMPARISON:  Head CT earlier same day FINDINGS: MRI HEAD FINDINGS Brain: Acute infarction of the left para median pons. No other acute infarction. No swelling or hemorrhage. Elsewhere, there chronic small-vessel ischemic changes of the pons. No focal cerebellar insult. Cerebral hemispheres show moderate chronic small-vessel ischemic changes of the deep and subcortical white matter. No mass lesion, acute hemorrhage, hydrocephalus or extra-axial collection. There is some old hemosiderin deposition along the surface of the brain  in the right parieto-occipital region. Vascular: Major vessels at the base of the brain show flow. Skull and upper cervical spine: Negative Sinuses/Orbits: Clear/normal Other: None MRA HEAD FINDINGS Both internal carotid arteries are patent through the skull  base and siphon regions. No flow limiting siphon stenosis suspected. The anterior and middle cerebral vessels are patent. There are stenoses in the MCA bifurcation regions, worse on the left than the right. Mild stenoses are seen in the A1 segments. Both vertebral arteries are patent to the basilar. There is mild atherosclerotic change of the basilar artery. There is a patent left posterior inferior cerebellar artery. There is a patent right anterior inferior cerebellar artery. Both superior cerebellar arteries show flow. Both posterior cerebral arteries are patent, though there are serial stenoses, with diminished visualization of the more distal branch vessels. IMPRESSION: Acute infarction of the left para median pons. No swelling or hemorrhage in that location. Chronic small-vessel ischemic changes elsewhere within the pons and within the hemispheric white matter. MR angiography shows extensive intracranial atherosclerotic disease, with stenoses in the MCA bifurcation regions left more than right and of the posterior cerebral arteries on both sides. No large vessel occlusion identified. Electronically Signed   By: Paulina Fusi M.D.   On: 03/05/2019 17:54     Medications:  Scheduled:  aspirin  325 mg Oral Daily   atorvastatin  40 mg Oral q1800   donepezil  10 mg Oral Daily   enoxaparin (LOVENOX) injection  40 mg Subcutaneous Q24H   memantine  10 mg Oral BID   Continuous:  sodium chloride 75 mL/hr at 03/05/19 1846   TAV:[id number] **OR** acetaminophen (TYLENOL) oral liquid 160 mg/5 mL **OR** acetaminophen, hydrALAZINE, senna-docusate    Assessment/Plan:  Acute stroke, nonhemorrhagic MRI shows acute stroke involving the left pons.  Patient with dysarthria, right upper and lower extremity weakness.  Stroke work-up is in progress.  LDL 175.  Patient is on a statin.  HbA1c 5.9.  PT and OT evaluation.  Echocardiogram is pending.  Started on aspirin for now.  Essential  hypertension Allowing permissive hypertension.  Monitor blood pressures.  Hyperlipidemia LDL noted to be 175.  HDL 37.  Triglyceride 110.  Review of her home medications suggest that patient was not on cholesterol-lowering medication.  Started on atorvastatin here.  History of vertebral fractures and humeral fracture Not an active issue currently.  PT and OT evaluation.  History of dementia Continue donepezil and memantine.  DVT Prophylaxis: Lovenox    Code Status: DNR Family Communication: Discussed with the patient Disposition Plan: Management as outlined above.    LOS: 1 day   Eragon Hammond [employer] on [email or web address]  03/06/2019, 10:02 AM

## 2019-03-06 NOTE — Progress Notes (Signed)
Inpatient Rehabilitation Admissions Coordinator  Inpatient Rehab Consult received. I met with patient at the bedside for rehabilitation assessment. I also contacted her daughter, [place], by phone to discuss pt's PLOF and preference for rehab venue. Patient lived at [facility] with her spouse pta. Daughter prefers SNF rehab at [facility]. I have updated SW, Kathlee Nations, and will sign off at this time.  Danne Baxter, RN, MSN Rehab Admissions Coordinator [phone] 03/06/2019 12:46 PM

## 2019-03-06 NOTE — [place] FL2 (Signed)
[place] MEDICAID FL2 LEVEL OF CARE SCREENING TOOL     IDENTIFICATION  Patient Name: Ana [name] Birthdate: 24-Nov-1926 Sex: female Admission Date (Current Location): 03/05/2019  [facility] and [name] Number:  Producer, television/film/video and Address:  [facility], [address], [place], [place] [address]      Provider Number: [id number]  Attending Physician Name and Address:  Osvaldo Shipper, MD  Relative Name and Phone Number:       Current Level of Care: Hospital Recommended Level of Care: Skilled Nursing Facility Prior Approval Number:    Date Approved/Denied:   PASRR Number: [id number]  Discharge Plan: SNF    Current Diagnoses: Patient Active Problem List   Diagnosis Date Noted  . HLD (hyperlipidemia) 03/05/2019  . Stroke (cerebrum) (HCC) 03/05/2019  . Prediabetes 02/15/2018  . Lumbar vertebral fracture (HCC) 10/09/2017  . Stress incontinence 09/07/2017  . Nocturia 08/24/2016  . Ganglion cyst 02/17/2016  . Eczema 02/17/2016  . history of Fracture of right humerus 04/28/2015  . Memory loss 04/28/2015  . Hearing loss 04/28/2015  . Balance problem 04/28/2015  . Varicose veins 04/28/2015  . History of Fracture of right lower leg 04/28/2015  . Allergy   . Allergic rhinitis   . Osteoporosis   . Anxiety   . Hyperlipidemia   . Hypertension     Orientation RESPIRATION BLADDER Height & Weight     Self, Situation, Place  Normal Incontinent Weight:   Height:     BEHAVIORAL SYMPTOMS/MOOD NEUROLOGICAL BOWEL NUTRITION STATUS      Continent Diet(Heart healthy with thin liquids)  AMBULATORY STATUS COMMUNICATION OF NEEDS Skin   Extensive Assist Verbally Normal                       Personal Care Assistance Level of Assistance  Bathing, Feeding, Dressing Bathing Assistance: Maximum assistance Feeding assistance: Limited assistance Dressing Assistance: Maximum assistance     Functional Limitations Info  Sight, Hearing, Speech  Sight Info: Adequate Hearing Info: Adequate Speech Info: Adequate    SPECIAL CARE FACTORS FREQUENCY  PT (By licensed PT), OT (By licensed OT), Speech therapy     PT Frequency: 5x/wk OT Frequency: 5x/wk     Speech Therapy Frequency: 5x/wk      Contractures Contractures Info: Not present    Additional Factors Info  Code Status, Allergies, Psychotropic Code Status Info: DNR Allergies Info: ACE INHIBITORS, ALMOND OIL, PLUM PULP  Psychotropic Info: Aricept 10 mg daily/ Namenda 10 mg twice a day         Current Medications (03/06/2019):  This is the current hospital active medication list Current Facility-Administered Medications  Medication Dose Route Frequency Provider Last Rate Last Dose  . 0.9 %  sodium chloride infusion   Intravenous Continuous Drema Dallas, MD 75 mL/hr at 03/05/19 1846    . acetaminophen (TYLENOL) tablet 650 mg  650 mg Oral Q4H PRN Drema Dallas, MD   650 mg at 03/05/19 2141   Or  . acetaminophen (TYLENOL) solution 650 mg  650 mg Per Tube Q4H PRN Drema Dallas, MD       Or  . acetaminophen (TYLENOL) suppository 650 mg  650 mg Rectal Q4H PRN Drema Dallas, MD      . aspirin tablet 325 mg  325 mg Oral Daily Osvaldo Shipper, MD   325 mg at 03/06/19 1247  . atorvastatin (LIPITOR) tablet 40 mg  40 mg Oral q1800 Carolyne Littles  J, MD   40 mg at 03/05/19 2141  . donepezil (ARICEPT) tablet 10 mg  10 mg Oral Daily Drema Dallas, MD   10 mg at 03/06/19 1247  . enoxaparin (LOVENOX) injection 40 mg  40 mg Subcutaneous Q24H Drema Dallas, MD   40 mg at 03/05/19 1845  . hydrALAZINE (APRESOLINE) injection 5 mg  5 mg Intravenous Q6H PRN Drema Dallas, MD      . memantine [facility]) tablet 10 mg  10 mg Oral BID Drema Dallas, MD   10 mg at 03/06/19 1247  . senna-docusate (Senokot-S) tablet 1 tablet  1 tablet Oral QHS PRN Drema Dallas, MD         Discharge Medications: Please see discharge summary for a list of discharge medications.  Relevant Imaging  Results:  Relevant Lab Results:   Additional Information SS#: [id number]  Kermit Balo, RN

## 2019-03-06 NOTE — Evaluation (Signed)
Physical Therapy Evaluation Patient Details Name: Ana [name] MRN: [id number] DOB: Mar 18, 1926 Today's Date: 03/06/2019   History of Present Illness  Pt is a 85 y/o female with PMH of dementia, anxiety, falls, lumbar fx, R humerus fx, HTN presents to ED for 2 days of stroke symptoms including weakness in R UE/LE, difficulty word finding. MRI reveals acute infarction of L para median pons.   Clinical Impression  Patient presents with right sided hemiparesis, incoordination, dysarthria, impaired balance, impaired cognition and impaired mobility s/p above. Pt from [facility] and reports some assist with ADLs and walks to dining hall with RW PTA. Lives with spouse. Today, requires Mod A for standing transfers and Max A of 2 for gait training due to ataxia, right knee instability, incoordination and balance. Pt highly motivated to return to PLOF. Would benefit from intensive therapies to maximize independence and mobility prior to return home. Will follow acutely.    Follow Up Recommendations CIR;Supervision for mobility/OOB    Equipment Recommendations  Other (comment)(defer)    Recommendations for Other Services       Precautions / Restrictions Precautions Precautions: Fall Restrictions Weight Bearing Restrictions: No      Mobility  Bed Mobility Overal bed mobility: Needs Assistance Bed Mobility: Sit to Supine       Sit to supine: Min assist   General bed mobility comments: min assist for LB mgmt to supine  Transfers Overall transfer level: Needs assistance Equipment used: Rolling walker (2 wheeled) Transfers: Sit to/from Stand Sit to Stand: Mod assist;+2 safety/equipment         General transfer comment: mod assist to fully ascend into standing with RW, cueing for technique, hand positoining and safety with noted posterior lean, stood from [facility] x2, cues to place right hand in order to stand.   Ambulation/Gait Ambulation/Gait assistance: Max assist;+2 physical  assistance;+2 safety/equipment Gait Distance (Feet): 6 Feet Assistive device: Rolling walker (2 wheeled) Gait Pattern/deviations: Step-through pattern;Step-to pattern;Narrow base of support;Trunk flexed;Ataxic;Decreased stride length;Decreased dorsiflexion - right;Steppage Gait velocity: decreased Gait velocity interpretation: <1.8 ft/sec, indicate of risk for recurrent falls General Gait Details: Slow, unsteady and ataxic like gait with narrow BoS, right knee instability. Assist with RW management/proximity, RLE advancement and stability during stance phase and cues for upright. Forward flexed posture. Difficulty coordinating Bles and trunk.  Stairs            Wheelchair Mobility    Modified Rankin (Stroke Patients Only) Modified Rankin (Stroke Patients Only) Pre-Morbid Rankin Score: Moderate disability Modified Rankin: Moderately severe disability     Balance Overall balance assessment: Needs assistance Sitting-balance support: No upper extremity supported;Feet supported Sitting balance-Leahy Scale: Fair Sitting balance - Comments: statically min guard but limited dynamic balance    Standing balance support: Bilateral upper extremity supported;During functional activity Standing balance-Leahy Scale: Poor Standing balance comment: relaint on BUE and external support                             Pertinent Vitals/Pain Pain Assessment: No/denies pain    Home Living Family/patient expects to be discharged to:: Other (Comment)(independent living)               Home Equipment: Walker - 2 wheels;Walker - 4 wheels Additional Comments: [facility] with spouse    Prior Function Level of Independence: Needs assistance   Gait / Transfers Assistance Needed: Uses RW for ambulation  ADL's / Homemaking Assistance Needed: Needs some assist  with bathing, dressing and toileting         Hand Dominance   Dominant Hand: Right    Extremity/Trunk Assessment    Upper Extremity Assessment Upper Extremity Assessment: Defer to OT evaluation RUE Deficits / Details: grossly 3-/5, AROM FF to approx 80 degress, dysmetric; able to use a gross stabilizer RUE Sensation: decreased proprioception RUE Coordination: decreased fine motor;decreased gross motor    Lower Extremity Assessment Lower Extremity Assessment: RLE deficits/detail;LLE deficits/detail RLE Deficits / Details: Grossly ~3/5 throughout except hip flexion~2+/5 and ankle DF 1/5. RLE Sensation: decreased proprioception;WNL RLE Coordination: decreased fine motor;decreased gross motor    Cervical / Trunk Assessment Cervical / Trunk Assessment: Kyphotic  Communication   Communication: (dysarthric)  Cognition Arousal/Alertness: Awake/alert Behavior During Therapy: WFL for tasks assessed/performed Overall Cognitive Status: Impaired/Different from baseline Area of Impairment: Memory;Following commands;Safety/judgement;Awareness;Problem solving;Attention                   Current Attention Level: Sustained Memory: Decreased short-term memory Following Commands: Follows one step commands with increased time;Follows one step commands inconsistently Safety/Judgement: Decreased awareness of safety Awareness: Emergent Problem Solving: Slow processing;Decreased initiation;Difficulty sequencing;Requires verbal cues;Requires tactile cues        General Comments      Exercises     Assessment/Plan    PT Assessment Patient needs continued PT services  PT Problem List Decreased strength;Decreased balance;Decreased cognition;Decreased mobility;Decreased range of motion;Decreased activity tolerance;Decreased coordination       PT Treatment Interventions DME instruction;Functional mobility training;Balance training;Patient/family education;Gait training;Therapeutic activities;Neuromuscular re-education;Therapeutic exercise;Cognitive remediation    PT Goals (Current goals can be found in the  Care Plan section)  Acute Rehab PT Goals Patient Stated Goal: to get better PT Goal Formulation: With patient Time For Goal Achievement: 03/20/19 Potential to Achieve Goals: Good    Frequency Min 4X/week   Barriers to discharge        Co-evaluation PT/OT/SLP Co-Evaluation/Treatment: Yes Reason for Co-Treatment: For patient/therapist safety;To address functional/ADL transfers PT goals addressed during session: Mobility/safety with mobility OT goals addressed during session: ADL's and self-care;Other (comment)(mobility )       AM-PAC PT "6 Clicks" Mobility  Outcome Measure Help needed turning from your back to your side while in a flat bed without using bedrails?: A Little Help needed moving from lying on your back to sitting on the side of a flat bed without using bedrails?: A Lot Help needed moving to and from a bed to a chair (including a wheelchair)?: A Lot Help needed standing up from a chair using your arms (e.g., wheelchair or bedside chair)?: A Lot Help needed to walk in hospital room?: A Lot Help needed climbing 3-5 steps with a railing? : Total 6 Click Score: 12    End of Session Equipment Utilized During Treatment: Gait belt Activity Tolerance: Patient tolerated treatment well Patient left: in bed;with call bell/phone within reach;with bed alarm set Nurse Communication: Mobility status PT Visit Diagnosis: Hemiplegia and hemiparesis;Difficulty in walking, not elsewhere classified (R26.2);Unsteadiness on feet (R26.81);Ataxic gait (R26.0) Hemiplegia - Right/Left: Right Hemiplegia - dominant/non-dominant: Dominant Hemiplegia - caused by: Cerebral infarction    Time: 2376-2831 PT Time Calculation (min) (ACUTE ONLY): 31 min   Charges:   PT Evaluation $PT Eval Moderate Complexity: 1 Mod          Mylo Red, PT, DPT Acute Rehabilitation Services Pager [phone] Office [phone]      [name] A Lanier Ensign 03/06/2019, 11:32 AM

## 2019-03-06 NOTE — Evaluation (Signed)
Occupational Therapy Evaluation Patient Details Name: Ana [name] MRN: [id number] DOB: [birth year] Today's Date: 03/06/2019    History of Present Illness Pt is a 85 y/o female with PMH of dementia, anxiety, falls, lumbar fx, R humerus fx, HTN presents to ED for 2 days of stroke symptoms including weakness in R UE/LE, difficulty word finding. MRI reveals acute infarction of L para median pons.    Clinical Impression   PTA patient reports independent with mobility using rollator, but requires some assist with ADLs.  She lives with her spouse at [facility] independent [facility].  Admitted for above and limited by problem list below, including impaired cognition, decreased activity tolerance, R sided weakness, impaired coordination and ataxia, impaired balance.  She requires mod assist for UB ADLs, max-total assist +2 for LB ADLs and toileting, max assist +2 for toilet transfers using RW.  She will benefit from continued OT services while admitted and after dc at CIR level rehab in order to maximize independence and safety with ADLs/ mobility.  Will follow.     Follow Up Recommendations  CIR    Equipment Recommendations  3 in 1 bedside commode    Recommendations for Other Services Rehab consult     Precautions / Restrictions Precautions Precautions: Fall Restrictions Weight Bearing Restrictions: No      Mobility Bed Mobility Overal bed mobility: Needs Assistance Bed Mobility: Sit to Supine       Sit to supine: Min assist   General bed mobility comments: min assist for LB mgmt to supine  Transfers Overall transfer level: Needs assistance Equipment used: Rolling walker (2 wheeled) Transfers: Sit to/from Stand Sit to Stand: Mod assist;+2 safety/equipment         General transfer comment: mod assist to fully ascend into standing with RW, cueing for technique, hand positoining and safety with noted posterior lean; requires +2 max assist taking steps towards EOB after  toileting     Balance Overall balance assessment: Needs assistance Sitting-balance support: No upper extremity supported;Feet supported Sitting balance-Leahy Scale: Poor Sitting balance - Comments: statically min guard but limited dynamic balance    Standing balance support: Bilateral upper extremity supported;During functional activity Standing balance-Leahy Scale: Poor Standing balance comment: relaint on BUE and external support                           ADL either performed or assessed with clinical judgement   ADL Overall ADL's : Needs assistance/impaired     Grooming: Minimal assistance;Sitting   Upper Body Bathing: Moderate assistance;Sitting   Lower Body Bathing: +2 for physical assistance;Total assistance;Sit to/from stand   Upper Body Dressing : Moderate assistance;Sitting   Lower Body Dressing: Total assistance;+2 for physical assistance;Sit to/from stand   Toilet Transfer: Maximal assistance;+2 for physical assistance;+2 for safety/equipment;Ambulation;RW   Toileting- Clothing Manipulation and Hygiene: Total assistance;+2 for physical assistance;Sit to/from stand       Functional mobility during ADLs: Maximal assistance;+2 for physical assistance;+2 for safety/equipment;Rolling walker General ADL Comments: limited by R UE weakness, impaired balance, impaired cognition and decreased act tolerance     Vision Baseline Vision/History: Wears glasses Wears Glasses: Reading only Patient Visual Report: No change from baseline Additional Comments: functionally appears WFL, difficulty maintaining attention to scan towards L side      Perception     Praxis      Pertinent Vitals/Pain Pain Assessment: No/denies pain     Hand Dominance Right   Extremity/Trunk Assessment Upper  Extremity Assessment Upper Extremity Assessment: RUE deficits/detail RUE Deficits / Details: grossly 3-/5, AROM FF to approx 80 degress, dysmetric; able to use a gross  stabilizer RUE Sensation: decreased proprioception RUE Coordination: decreased fine motor;decreased gross motor   Lower Extremity Assessment Lower Extremity Assessment: Defer to PT evaluation       Communication Communication Communication: Expressive difficulties(dysarthric)   Cognition Arousal/Alertness: Awake/alert Behavior During Therapy: WFL for tasks assessed/performed Overall Cognitive Status: Impaired/Different from baseline Area of Impairment: Memory;Following commands;Safety/judgement;Awareness;Problem solving;Attention                   Current Attention Level: Sustained Memory: Decreased short-term memory Following Commands: Follows one step commands with increased time;Follows one step commands inconsistently Safety/Judgement: Decreased awareness of safety Awareness: Emergent Problem Solving: Slow processing;Decreased initiation;Difficulty sequencing;Requires verbal cues;Requires tactile cues     General Comments       Exercises     Shoulder Instructions      Home Living Family/patient expects to be discharged to:: Other (Comment)(independent living)                             Home Equipment: Walker - 2 wheels;Walker - 4 wheels   Additional Comments: [facility] with spouse      Prior Functioning/Environment Level of Independence: Needs assistance  Gait / Transfers Assistance Needed: Uses RW for ambulation ADL's / Homemaking Assistance Needed: Needs some assist with bathing, dressing and toileting             OT Problem List: Decreased strength;Decreased range of motion;Decreased activity tolerance;Impaired balance (sitting and/or standing);Decreased coordination;Decreased cognition;Impaired vision/perception;Decreased safety awareness;Decreased knowledge of use of DME or AE;Impaired UE functional use      OT Treatment/Interventions: Self-care/ADL training;Therapeutic exercise;Neuromuscular education;DME and/or AE  instruction;Therapeutic activities;Cognitive remediation/compensation;Visual/perceptual remediation/compensation;Patient/family education;Balance training    OT Goals(Current goals can be found in the care plan section) Acute Rehab OT Goals Patient Stated Goal: to get better OT Goal Formulation: With patient Time For Goal Achievement: 03/20/19 Potential to Achieve Goals: Good  OT Frequency: Min 3X/week   Barriers to D/C:            Co-evaluation PT/OT/SLP Co-Evaluation/Treatment: Yes Reason for Co-Treatment: For patient/therapist safety;To address functional/ADL transfers PT goals addressed during session: Mobility/safety with mobility OT goals addressed during session: ADL's and self-care;Other (comment)(mobility )      AM-PAC OT "6 Clicks" Daily Activity     Outcome Measure Help from another person eating meals?: A Little Help from another person taking care of personal grooming?: A Little Help from another person toileting, which includes using toliet, bedpan, or urinal?: Total Help from another person bathing (including washing, rinsing, drying)?: Total Help from another person to put on and taking off regular upper body clothing?: A Lot Help from another person to put on and taking off regular lower body clothing?: Total 6 Click Score: 11   End of Session Equipment Utilized During Treatment: Gait belt;Rolling walker Nurse Communication: Mobility status  Activity Tolerance: Patient tolerated treatment well Patient left: in bed;with call bell/phone within reach;with bed alarm set  OT Visit Diagnosis: Other abnormalities of gait and mobility (R26.89);Hemiplegia and hemiparesis;Other symptoms and signs involving cognitive function Hemiplegia - Right/Left: Right Hemiplegia - dominant/non-dominant: Dominant Hemiplegia - caused by: Cerebral infarction                Time: 8295-6213 OT Time Calculation (min): 31 min Charges:  OT General Charges $OT Visit: 1  Visit OT  Evaluation $OT Eval Moderate Complexity: 1 Mod  Chancy Milroy, [place] Acute Rehabilitation Services Pager [phone] Office [phone]    Chancy Milroy 03/06/2019, 10:59 AM

## 2019-03-06 NOTE — Consult Note (Addendum)
NEUROLOGY CONSULTATION  Referring Physician: Dr. Rito Ehrlich    Chief Complaint: stroke  HPI: Ana [name] is an 85 y.o. female with PMH of dementia, anxiety, HTN, HLD, falls, osteoporosis who presented from SNF to [facility] 03/05/19 with two day history of right sided weakness, and difficulty with speech.   Patient states she noticed onset of right sided weakness about 2 days ago worse in her right arm than leg. She also endorses difficulty with producing speech - she thinks it make sense however other people find it difficult to understand her. She denies associated numbness/tingling, facial assymmetry, dysphagia, vision or hearing changes, headaches, dizziness, chest pain, palpitations, recent fevers, chills, nausea, vomiting, diarrhea, bleeding. She reports maybe 3 falls in the last 3 months, however it is difficult to understand if this is accurate.  Vitals on admission were BP 140/58, HR 65, temp 98.82F, saturating 96% on room air. On admission, CBC was unremarkable with WBC 0.2, Hgb 13, plts 209. Cmet revealed Na 144, K 3.8, CO2 26, Cr 0.92, glucose 96. PT was 13.3, APTT was 30. Hgb A1c was 5.9. Total cholesterol was 234, LCL 175. UA ordered but not collected yet. EKG revealed sinus rhythm.   CT head revealed small vessel white matter disease without acute intracranial pathology. MRI brain revealed acute infarct of left paramedian pons without associated hemorrhage. MRA reveals extensive intracranial atherosclerotic disease with stenoses in the MCA bifurcation L>R and posterior cerebral arteries on both sides. No LVO noted.   LSN: ? 03/03/19 tPA Given: No: outside of window NIHSS 1a Level of Conscious.: 0 1b LOC Questions: 0 1c LOC Commands: 0 2 Best Gaze: 0 3 Visual: 0 4 Facial Palsy: 1 5a Motor Arm - left: 0 5b Motor Arm - Right: 1 6a Motor Leg - Left: 0 6b Motor Leg - Right: 2 7 Limb Ataxia: 0 8 Sensory: 0 9 Best Language: 0 10 Dysarthria: 2 11 Extinct. and Inatten.: 0 TOTAL:  6   Past Medical History:  Diagnosis Date  . Allergic rhinitis   . Anxiety   . Balance problem 04/28/2015  . Fatigue 04/28/2015  . Hearing loss 04/28/2015  . history of Fracture of right humerus 04/28/2015  . HLD (hyperlipidemia) 03/05/2019  . Hyperglycemia   . Hyperlipidemia   . Hypertension   . Memory loss 04/28/2015   04/26/2014 MMSE 28/30. Failed clock drawing. 04/21/15 MMSE 21/30. Failed clock drawing.   . Osteoporosis     Past Surgical History:  Procedure Laterality Date  . ABDOMINAL HYSTERECTOMY    . CATARACT EXTRACTION  2010  . ELBOW SURGERY Right 1988  . FEMUR FRACTURE SURGERY Right 06/2014   [place], [place] Dr. Dione Housekeeper  . HUMERUS FRACTURE SURGERY Right 2015   [place], [place] Dr. Eber Hong  . TONSILLECTOMY  1935    Family History  Problem Relation Age of Onset  . Heart disease Mother   . Diabetes Daughter    Social History:  reports that she has never smoked. She has never used smokeless tobacco. She reports current alcohol use of about 1.0 - 2.0 standard drinks of alcohol per week. She reports that she does not use drugs.  Allergies:  Allergies  Allergen Reactions  . Ace Inhibitors Other (See Comments)    Unknown reaction  . Almond Oil Rash  . Plum Pulp Rash    Medications:  Scheduled: . atorvastatin  40 mg Oral q1800  . donepezil  10 mg Oral Daily  . enoxaparin (LOVENOX) injection  40 mg Subcutaneous Q24H  .  memantine  10 mg Oral BID    ROS: review of systems performed and pertinent positives noted in HPI  Physical Examination: Blood pressure 97/69, pulse (!) 57, temperature 97.7 F (36.5 C), temperature source Oral, resp. rate 16, SpO2 96 %. HEENT: scratch on forehead (patient denies hitting head but has been picking at the spot), anicteric sclera CV: RRR Resp: No increased work of breathing Ext: warm well perfused  Neurologic Examination: Mental Status: Alert, oriented except for date (able to tell month and her age correctly), thought  content appropriate. Able to name objects appropriately. Decreased attention/concentration. Cranial Nerves: PERRL, EOMI, VFF, face mildly asymmetric with right lower face weakness, auditory acuity decreased bilaterally, palate midline, tongue midline. Motor: Right : Upper extremity   3/5   Lower extremity   2/5    Left:     Upper extremity   4/5  Lower extremity   4/5     Tone and bulk:normal tone throughout; no atrophy noted Sensory: Light touch intact throughout, bilaterally Deep Tendon Reflexes: 2+ and symmetric throughout Plantars: Right: downgoing   Left: downgoing Cerebellar: normal finger-to-nose on left, unable to perform on R due to weakness Right pronator drift  Labs CBC    Component Value Date/Time   WBC 9.2 03/05/2019 1156   RBC 4.02 03/05/2019 1156   HGB 13.0 03/05/2019 1156   HCT 40.0 03/05/2019 1156   PLT 209 03/05/2019 1156   MCV 99.5 03/05/2019 1156   MCH 32.3 03/05/2019 1156   MCHC 32.5 03/05/2019 1156   RDW 13.0 03/05/2019 1156   LYMPHSABS 3.1 03/05/2019 1156   MONOABS 0.7 03/05/2019 1156   EOSABS 0.1 03/05/2019 1156   BASOSABS 0.1 03/05/2019 1156   BMP Latest Ref Rng & Units 03/06/2019 03/05/2019 11/13/2018  Glucose 70 - 99 mg/dL 98 96 161(W)  BUN 8 - 23 mg/dL Creatinine 0.44 - 1.00 mg/dL 9.60(A) 5.40 9.81(X)  BUN/Creat Ratio 6 - 22 (calc) - - 17  Sodium 135 - 145 mmol/L 142 144 146  Potassium 3.5 - 5.1 mmol/L 3.9 3.8 4.0  Chloride 98 - 111 mmol/L 107 109 110  CO2 22 - 32 mmol/L Calcium 8.9 - 10.3 mg/dL 9.4 9.4 9.2  B1Y: 5.9 Echo -P  Imaging CTH: no acute changes MRI -left pontine infarct MRA head with extensive intracranial atherosclerotic disease without any emergent LVO  Assessment: 85 y.o. female with PMH of dementia, HTN, HLD presenting to [facility] with 2 day history of right sided weakness found to have a left pontine infarct with extensive intracranial atherosclerotic disease.  Likely small vessel etiology  Stroke Risk  Factors -age,  hyperlipidemia and hypertension  Plan: 1. HgbA1c, fasting lipid panel - already obtained 2. MRI, MRA  of the brain without contrast - already obtained 3. PT consult, OT consult, Speech consult - already ordered 4. Echocardiogram - already ordered 5. Carotid dopplers - already ordered 6. Prophylactic therapy-Antiplatelet med: ASA 325 for now. Consider DAPT due to ICAD (will need more history for falls to ascertain fall risk) 7. Risk factor modification 8. Telemetry monitoring 9. Frequent neuro checks  Nyra Market, MD PGY3  03/06/2019, 8:19 AM  Attending addendum I have seen and examined the patient I have obtained the history, review of systems, performed independent exam and stroke scale. I reviewed the images independently-MRI with left pontine infarct and MRA head showing no LVO.  Extensive intracranial atherosclerosis. Consider DAPT after getting more information on baseline and falls history. Stroke  team will follow with you.  -- Milon Dikes, MD [facility] Pager: [phone] If 7pm to 7am, please call on call as listed on AMION.

## 2019-03-06 NOTE — TOC Initial Note (Signed)
Transition of Care [facility]) - Initial/Assessment Note    Patient Details  Name: Ana [name] MRN: [id number] Date of Birth: [birth year]  [facility]) CM/SW Contact:    Ana Lenis, LCSW Phone Number: 03/06/2019, 2:41 PM  Clinical Narrative:  Patient from [facility] independent living, but plan is to admit to SNF when stable to discharge. [facility] will have a bed for her when stable.                   Expected Discharge Plan: Skilled Nursing Facility Barriers to Discharge: Insurance Authorization, Continued Medical Work up   Patient Goals and CMS Choice Patient states their goals for this hospitalization and ongoing recovery are:: to get better CMS Medicare.gov Compare Post Acute Care list provided to:: Patient Choice offered to / list presented to : Patient  Expected Discharge Plan and Services Expected Discharge Plan: Skilled Nursing Facility     Post Acute Care Choice: Skilled Nursing Facility Living arrangements for the past 2 months: Independent Living Facility                          Prior Living Arrangements/Services Living arrangements for the past 2 months: Independent Living Facility Lives with:: Self, Spouse Patient language and need for interpreter reviewed:: No Do you feel safe going back to the place where you live?: Yes      Need for Family Participation in Patient Care: Yes (Comment) Care giver support system in place?: Yes (comment)   Criminal Activity/Legal Involvement Pertinent to Current Situation/Hospitalization: No - Comment as needed  Activities of Daily Living      Permission Sought/Granted Permission sought to share information with : Facility Medical sales representative, Family Supports Permission granted to share information with : Yes, Verbal Permission Granted  Share Information with NAME: Ana [name]  Permission granted to share info w AGENCY: [facility]  Permission granted to share info w  Relationship: Daughter     Emotional Assessment Appearance:: Appears stated age Attitude/Demeanor/Rapport: Engaged Affect (typically observed): Pleasant Orientation: : Oriented to Self, Oriented to Place, Oriented to Situation Alcohol / Substance Use: Not Applicable Psych Involvement: No (comment)  Admission diagnosis:  Stroke (cerebrum) (HCC) [I63.9] Cerebrovascular accident (CVA), unspecified mechanism (HCC) [I63.9] Patient Active Problem List   Diagnosis Date Noted  . HLD (hyperlipidemia) 03/05/2019  . Stroke (cerebrum) (HCC) 03/05/2019  . Prediabetes 02/15/2018  . Lumbar vertebral fracture (HCC) 10/09/2017  . Stress incontinence 09/07/2017  . Nocturia 08/24/2016  . Ganglion cyst 02/17/2016  . Eczema 02/17/2016  . history of Fracture of right humerus 04/28/2015  . Memory loss 04/28/2015  . Hearing loss 04/28/2015  . Balance problem 04/28/2015  . Varicose veins 04/28/2015  . History of Fracture of right lower leg 04/28/2015  . Allergy   . Allergic rhinitis   . Osteoporosis   . Anxiety   . Hyperlipidemia   . Hypertension    PCP:  Mast, Man X, NP Pharmacy:   [facility] [address] [place] - [address] [place] [address] Phone: [phone] Fax: [phone]     Social Determinants of Health (SDOH) Interventions    Readmission Risk Interventions No flowsheet data found.

## 2019-03-07 ENCOUNTER — Other Ambulatory Visit: Payer: Self-pay | Admitting: Neurology

## 2019-03-07 DIAGNOSIS — I69392 Facial weakness following cerebral infarction: Secondary | ICD-10-CM | POA: Diagnosis not present

## 2019-03-07 DIAGNOSIS — R279 Unspecified lack of coordination: Secondary | ICD-10-CM | POA: Diagnosis not present

## 2019-03-07 DIAGNOSIS — R4781 Slurred speech: Secondary | ICD-10-CM | POA: Diagnosis not present

## 2019-03-07 DIAGNOSIS — R2681 Unsteadiness on feet: Secondary | ICD-10-CM | POA: Diagnosis not present

## 2019-03-07 DIAGNOSIS — I69828 Other speech and language deficits following other cerebrovascular disease: Secondary | ICD-10-CM | POA: Diagnosis not present

## 2019-03-07 DIAGNOSIS — F419 Anxiety disorder, unspecified: Secondary | ICD-10-CM | POA: Diagnosis not present

## 2019-03-07 DIAGNOSIS — G459 Transient cerebral ischemic attack, unspecified: Secondary | ICD-10-CM | POA: Diagnosis not present

## 2019-03-07 DIAGNOSIS — E7849 Other hyperlipidemia: Secondary | ICD-10-CM | POA: Diagnosis not present

## 2019-03-07 DIAGNOSIS — I69922 Dysarthria following unspecified cerebrovascular disease: Secondary | ICD-10-CM | POA: Diagnosis not present

## 2019-03-07 DIAGNOSIS — F0390 Unspecified dementia without behavioral disturbance: Secondary | ICD-10-CM | POA: Diagnosis not present

## 2019-03-07 DIAGNOSIS — Z8781 Personal history of (healed) traumatic fracture: Secondary | ICD-10-CM | POA: Diagnosis not present

## 2019-03-07 DIAGNOSIS — I10 Essential (primary) hypertension: Secondary | ICD-10-CM | POA: Diagnosis not present

## 2019-03-07 DIAGNOSIS — R278 Other lack of coordination: Secondary | ICD-10-CM | POA: Diagnosis not present

## 2019-03-07 DIAGNOSIS — M810 Age-related osteoporosis without current pathological fracture: Secondary | ICD-10-CM | POA: Diagnosis not present

## 2019-03-07 DIAGNOSIS — K219 Gastro-esophageal reflux disease without esophagitis: Secondary | ICD-10-CM | POA: Diagnosis not present

## 2019-03-07 DIAGNOSIS — I69322 Dysarthria following cerebral infarction: Secondary | ICD-10-CM | POA: Diagnosis not present

## 2019-03-07 DIAGNOSIS — I6302 Cerebral infarction due to thrombosis of basilar artery: Secondary | ICD-10-CM

## 2019-03-07 DIAGNOSIS — M6281 Muscle weakness (generalized): Secondary | ICD-10-CM | POA: Diagnosis not present

## 2019-03-07 DIAGNOSIS — R2689 Other abnormalities of gait and mobility: Secondary | ICD-10-CM | POA: Diagnosis not present

## 2019-03-07 DIAGNOSIS — I63512 Cerebral infarction due to unspecified occlusion or stenosis of left middle cerebral artery: Secondary | ICD-10-CM | POA: Diagnosis not present

## 2019-03-07 DIAGNOSIS — I69891 Dysphagia following other cerebrovascular disease: Secondary | ICD-10-CM | POA: Diagnosis not present

## 2019-03-07 DIAGNOSIS — Z743 Need for continuous supervision: Secondary | ICD-10-CM | POA: Diagnosis not present

## 2019-03-07 DIAGNOSIS — I639 Cerebral infarction, unspecified: Secondary | ICD-10-CM | POA: Diagnosis not present

## 2019-03-07 LAB — COMPREHENSIVE METABOLIC PANEL
ALT: 12 U/L (ref 0–44)
AST: 19 U/L (ref 15–41)
Albumin: 3.2 g/dL — ABNORMAL LOW (ref 3.5–5.0)
Alkaline Phosphatase: 47 U/L (ref 38–126)
Anion gap: 8 (ref 5–15)
BUN: 20 mg/dL (ref 8–23)
CO2: 25 mmol/L (ref 22–32)
Calcium: 9.3 mg/dL (ref 8.9–10.3)
Chloride: 108 mmol/L (ref 98–111)
Creatinine, Ser: 0.91 mg/dL (ref 0.44–1.00)
GFR calc Af Amer: >60 mL/min (ref >60)
GFR, EST NON AFRICAN AMERICAN: 56 mL/min — AB (ref >60)
Glucose, Bld: 105 mg/dL — ABNORMAL HIGH (ref 70–99)
Potassium: 3.8 mmol/L (ref 3.5–5.1)
Sodium: 141 mmol/L (ref 135–145)
Total Bilirubin: 0.6 mg/dL (ref 0.3–1.2)
Total Protein: 5.7 g/dL — ABNORMAL LOW (ref 6.5–8.1)

## 2019-03-07 LAB — CBC
HCT: 39 % (ref 36.0–46.0)
Hemoglobin: 12.5 g/dL (ref 12.0–15.0)
MCH: 31 pg (ref 26.0–34.0)
MCHC: 32.1 g/dL (ref 30.0–36.0)
MCV: 96.8 fL (ref 80.0–100.0)
PLATELETS: 193 10*3/uL (ref 150–400)
RBC: 4.03 MIL/uL (ref 3.87–5.11)
RDW: 12.6 % (ref 11.5–15.5)
WBC: 8.9 10*3/uL (ref 4.0–10.5)
nRBC: 0 % (ref 0.0–0.2)

## 2019-03-07 MED ORDER — CLOPIDOGREL BISULFATE 75 MG PO TABS
75.0000 mg | ORAL_TABLET | Freq: Every day | ORAL | Status: DC
  Administered 2019-03-07: 75 mg via ORAL
  Filled 2019-03-07: qty 1

## 2019-03-07 MED ORDER — ASPIRIN EC 81 MG PO TBEC
81.0000 mg | DELAYED_RELEASE_TABLET | Freq: Every day | ORAL | Status: DC
  Administered 2019-03-07: 81 mg via ORAL
  Filled 2019-03-07: qty 1

## 2019-03-07 MED ORDER — PANTOPRAZOLE SODIUM 40 MG PO TBEC: 40.0000 mg | DELAYED_RELEASE_TABLET | Freq: Every day | ORAL | Status: DC

## 2019-03-07 MED ORDER — CLOPIDOGREL BISULFATE 75 MG PO TABS: 75.0000 mg | ORAL_TABLET | Freq: Every day | ORAL | Status: DC

## 2019-03-07 MED ORDER — ATORVASTATIN CALCIUM 40 MG PO TABS: 40.0000 mg | ORAL_TABLET | Freq: Every day | ORAL | Status: AC

## 2019-03-07 MED ORDER — ASPIRIN 81 MG PO TBEC: 81.0000 mg | DELAYED_RELEASE_TABLET | Freq: Every day | ORAL | Status: DC

## 2019-03-07 NOTE — Progress Notes (Signed)
STROKE TEAM PROGRESS NOTE   INTERVAL HISTORY Patient lying in bed, awake alert.  Fully orientated.  Still has right sided weakness and mild dysarthria.  Vitals:   03/06/19 1948 03/07/19 0347 03/07/19 0801 03/07/19 1139  BP: (!) 164/70 (!) 181/82 (!) 185/84 (!) 159/75  Pulse: 60 62 62 60  Resp: 16 16 18 17   Temp: 98.4 F (36.9 C) 98 F (36.7 C) 98.4 F (36.9 C) 97.8 F (36.6 C)  TempSrc: Oral Oral Oral Oral  SpO2: 99% 97% 96% 97%    CBC:  Recent Labs  Lab 03/05/19 1156 03/07/19 0457  WBC 9.2 8.9  NEUTROABS 5.2  --   HGB 13.0 12.5  HCT 40.0 39.0  MCV 99.5 96.8  PLT 209 193    Basic Metabolic Panel:  Recent Labs  Lab 03/06/19 0542 03/07/19 0457  NA 142 141  K 3.9 3.8  CL 107 108  CO2 26 25  GLUCOSE 98 105*  BUN 17 20  CREATININE 1.03* 0.91  CALCIUM 9.4 9.3   Lipid Panel:     Component Value Date/Time   CHOL 234 (H) 03/06/2019 0542   TRIG 110 03/06/2019 0542   HDL 37 (L) 03/06/2019 0542   CHOLHDL 6.3 03/06/2019 0542   VLDL 22 03/06/2019 0542   LDLCALC 175 (H) 03/06/2019 0542   LDLCALC 179 (H) 07/17/2018 0815   HgbA1c:  Lab Results  Component Value Date   HGBA1C 5.9 (H) 03/06/2019   Urine Drug Screen: No results found for: LABOPIA, COCAINSCRNUR, LABBENZ, AMPHETMU, THCU, LABBARB  Alcohol Level     Component Value Date/Time   ETH <10 03/05/2019 1156    IMAGING Dg Chest 2 View  Result Date: 03/05/2019 CLINICAL DATA:  Acute CVA EXAM: CHEST - 2 VIEW COMPARISON:  January 20, 2018 FINDINGS: There is no appreciable edema or consolidation. Heart is borderline enlarged with pulmonary vascularity normal. No adenopathy. There is aortic atherosclerosis. There is degenerative change in the thoracic spine. IMPRESSION: Line cardiac enlargement. No edema or consolidation. Aortic Atherosclerosis (ICD10-I70.0). Electronically Signed   By: Bretta Bang III M.D.   On: 03/05/2019 18:04   Mr Brain Wo Contrast  Result Date: 03/05/2019 CLINICAL DATA:  Focal  neurological deficit. Right sided weakness. Slurred speech. EXAM: MRI HEAD WITHOUT CONTRAST MRA HEAD WITHOUT CONTRAST TECHNIQUE: Multiplanar, multiecho pulse sequences of the brain and surrounding structures were obtained without intravenous contrast. Angiographic images of the head were obtained using MRA technique without contrast. COMPARISON:  Head CT earlier same day FINDINGS: MRI HEAD FINDINGS Brain: Acute infarction of the left para median pons. No other acute infarction. No swelling or hemorrhage. Elsewhere, there chronic small-vessel ischemic changes of the pons. No focal cerebellar insult. Cerebral hemispheres show moderate chronic small-vessel ischemic changes of the deep and subcortical white matter. No mass lesion, acute hemorrhage, hydrocephalus or extra-axial collection. There is some old hemosiderin deposition along the surface of the brain in the right parieto-occipital region. Vascular: Major vessels at the base of the brain show flow. Skull and upper cervical spine: Negative Sinuses/Orbits: Clear/normal Other: None MRA HEAD FINDINGS Both internal carotid arteries are patent through the skull base and siphon regions. No flow limiting siphon stenosis suspected. The anterior and middle cerebral vessels are patent. There are stenoses in the MCA bifurcation regions, worse on the left than the right. Mild stenoses are seen in the A1 segments. Both vertebral arteries are patent to the basilar. There is mild atherosclerotic change of the basilar artery. There is a patent left  posterior inferior cerebellar artery. There is a patent right anterior inferior cerebellar artery. Both superior cerebellar arteries show flow. Both posterior cerebral arteries are patent, though there are serial stenoses, with diminished visualization of the more distal branch vessels. IMPRESSION: Acute infarction of the left para median pons. No swelling or hemorrhage in that location. Chronic small-vessel ischemic changes elsewhere  within the pons and within the hemispheric white matter. MR angiography shows extensive intracranial atherosclerotic disease, with stenoses in the MCA bifurcation regions left more than right and of the posterior cerebral arteries on both sides. No large vessel occlusion identified. Electronically Signed   By: Paulina Fusi M.D.   On: 03/05/2019 17:54   Mr Maxine Glenn Head Wo Contrast  Result Date: 03/05/2019 CLINICAL DATA:  Focal neurological deficit. Right sided weakness. Slurred speech. EXAM: MRI HEAD WITHOUT CONTRAST MRA HEAD WITHOUT CONTRAST TECHNIQUE: Multiplanar, multiecho pulse sequences of the brain and surrounding structures were obtained without intravenous contrast. Angiographic images of the head were obtained using MRA technique without contrast. COMPARISON:  Head CT earlier same day FINDINGS: MRI HEAD FINDINGS Brain: Acute infarction of the left para median pons. No other acute infarction. No swelling or hemorrhage. Elsewhere, there chronic small-vessel ischemic changes of the pons. No focal cerebellar insult. Cerebral hemispheres show moderate chronic small-vessel ischemic changes of the deep and subcortical white matter. No mass lesion, acute hemorrhage, hydrocephalus or extra-axial collection. There is some old hemosiderin deposition along the surface of the brain in the right parieto-occipital region. Vascular: Major vessels at the base of the brain show flow. Skull and upper cervical spine: Negative Sinuses/Orbits: Clear/normal Other: None MRA HEAD FINDINGS Both internal carotid arteries are patent through the skull base and siphon regions. No flow limiting siphon stenosis suspected. The anterior and middle cerebral vessels are patent. There are stenoses in the MCA bifurcation regions, worse on the left than the right. Mild stenoses are seen in the A1 segments. Both vertebral arteries are patent to the basilar. There is mild atherosclerotic change of the basilar artery. There is a patent left  posterior inferior cerebellar artery. There is a patent right anterior inferior cerebellar artery. Both superior cerebellar arteries show flow. Both posterior cerebral arteries are patent, though there are serial stenoses, with diminished visualization of the more distal branch vessels. IMPRESSION: Acute infarction of the left para median pons. No swelling or hemorrhage in that location. Chronic small-vessel ischemic changes elsewhere within the pons and within the hemispheric white matter. MR angiography shows extensive intracranial atherosclerotic disease, with stenoses in the MCA bifurcation regions left more than right and of the posterior cerebral arteries on both sides. No large vessel occlusion identified. Electronically Signed   By: Paulina Fusi M.D.   On: 03/05/2019 17:54   Vas US Carotid (at [facility])  Result Date: 03/07/2019 Carotid Arterial Duplex Study Indications:  CVA. Risk Factors: Hypertension, hyperlipidemia. Performing Technologist: Blanch Media RVS  Examination Guidelines: A complete evaluation includes B-mode imaging, spectral Doppler, color Doppler, and power Doppler as needed of all accessible portions of each vessel. Bilateral testing is considered an integral part of a complete examination. Limited examinations for reoccurring indications may be performed as noted.  Right Carotid Findings: +----------+--------+--------+--------+------------+--------+           PSV cm/sEDV cm/sStenosisDescribe    Comments +----------+--------+--------+--------+------------+--------+ CCA Prox  59      16              heterogenous         +----------+--------+--------+--------+------------+--------+  CCA Distal54      7               heterogenous         +----------+--------+--------+--------+------------+--------+ ICA Prox  83      30      1-39%   heterogenous         +----------+--------+--------+--------+------------+--------+ ICA Distal100     27                                    +----------+--------+--------+--------+------------+--------+ ECA       121                                          +----------+--------+--------+--------+------------+--------+ +----------+--------+-------+--------+-------------------+           PSV cm/sEDV cmsDescribeArm Pressure (mmHG) +----------+--------+-------+--------+-------------------+ Subclavian110                                        +----------+--------+-------+--------+-------------------+ +---------+--------+--+--------+-+---------+ VertebralPSV cm/s34EDV cm/s8Antegrade +---------+--------+--+--------+-+---------+  Left Carotid Findings: +----------+--------+--------+--------+------------+--------+           PSV cm/sEDV cm/sStenosisDescribe    Comments +----------+--------+--------+--------+------------+--------+ CCA Prox  84      14              heterogenous         +----------+--------+--------+--------+------------+--------+ CCA Distal74      18              heterogenous         +----------+--------+--------+--------+------------+--------+ ICA Prox  93      25      1-39%   heterogenous         +----------+--------+--------+--------+------------+--------+ ICA Distal88      27                                   +----------+--------+--------+--------+------------+--------+ ECA       120                                          +----------+--------+--------+--------+------------+--------+ +----------+--------+--------+--------+-------------------+ SubclavianPSV cm/sEDV cm/sDescribeArm Pressure (mmHG) +----------+--------+--------+--------+-------------------+           83                                          +----------+--------+--------+--------+-------------------+ +---------+--------+--+--------+-+---------+ VertebralPSV cm/s40EDV cm/s8Antegrade +---------+--------+--+--------+-+---------+  Summary: Right Carotid: Velocities in the right ICA  are consistent with a 1-39% stenosis. Left Carotid: Velocities in the left ICA are consistent with a 1-39% stenosis. Vertebrals: Bilateral vertebral arteries demonstrate antegrade flow. *See table(s) above for measurements and observations.  Electronically signed by Delia Heady MD on 03/07/2019 at 1:12:25 PM.    Final    2D Echocardiogram  1. The left ventricle has low normal systolic function, with an ejection fraction of 50-55%. The cavity size was normal. There is mildly increased left ventricular wall thickness. Left ventricular diastolic Doppler parameters are consistent with  impaired relaxation. Indeterminate filling pressures The E/e' is 8-15.  No evidence of left ventricular regional wall motion abnormalities.  2. The right ventricle has normal systolic function. The cavity was normal. There is no increase in right ventricular wall thickness.  3. The mitral valve is degenerative. Mild thickening of the mitral valve leaflet. Moderate calcification of the mitral valve leaflet. There is mild mitral annular calcification present.  4. The tricuspid valve is grossly normal.  5. The aortic valve was not well visualized Moderate calcification of the aortic valve.  6. The interatrial septum was not well visualized.   PHYSICAL EXAM  Temp:  [97.8 F (36.6 C)-98.4 F (36.9 C)] 97.8 F (36.6 C) (03/25 1139) Pulse Rate:  [60-65] 60 (03/25 1139) Resp:  [16-18] 17 (03/25 1139) BP: (157-185)/(70-84) 159/75 (03/25 1139) SpO2:  [96 %-99 %] 97 % (03/25 1139)  General - Well nourished, well developed, in no apparent distress.  Ophthalmologic - fundi not visualized due to noncooperation.  Cardiovascular - Regular rate and rhythm.  Mental Status -  Level of arousal and orientation to time, place, and person were intact. Language including expression, naming, repetition, comprehension was assessed and found intact. Mild dysarthria  Cranial Nerves II - XII - II - Visual field intact OU. III,  IV, VI - Extraocular movements intact. V - Facial sensation intact bilaterally. VII - right nasolabial fold flattening. VIII - Hearing & vestibular intact bilaterally. X - Palate elevates symmetrically. XI - Chin turning & shoulder shrug intact bilaterally. XII - Tongue protrusion intact.  Motor Strength - The patient's strength was normal in LUE and LLE extremities and 3/5 RUE and RLE.  Bulk was normal and fasciculations were absent.   Motor Tone - Muscle tone was assessed at the neck and appendages and was normal.  Reflexes - The patient's reflexes were symmetrical in all extremities and she had no pathological reflexes.  Sensory - Light touch, temperature/pinprick were assessed and were symmetrical.    Coordination - The patient had normal movements in the right hand with no ataxia or dysmetria.  Tremor was absent.  Gait and Station - deferred.   ASSESSMENT/PLAN Ms. Chapel Silverthorn is a 85 y.o. female with history of dementia, anxiety, HTN, HLD, falls, osteoporosis presenting from SNF with R sided weakness and difficulty with speech x 2 d.   Stroke:  left paramedian pontine infarct secondary to small vessel disease source  CT head No acute stroke. Small vessel disease.     MRI  L paramedian pontine infarct, Small vessel disease.  MRA  Extensive intracranial atherosclerosis, L>R MCA bifurcations and B PCAs.  Carotid Doppler unremarkable  2D Echo EF 50-55%. No source of embolus   LDL 175  HgbA1c 5.9  Lovenox 40 mg sq daily for VTE prophylaxis  Diet-hrt healthy  aspirin 325 mg daily prior to admission, now on aspirin 81 mg daily and clopidogrel 75 mg daily x 3 weeks then PLAVIX alone  Therapy recommendations:   SNF  Disposition:  Return to SNF  Hypertension  Stable . Permissive hypertension (OK if < 220/120) but gradually normalize in 3-5 days . Long-term BP goal normotensive  Hyperlipidemia  Home meds:  Fish oil  Now on lipitor 40  LDL 175, goal  < 70  Continue statin at discharge  Other Stroke Risk Factors  Advanced age  ETOH use, advised to drink no more than 1 drink(s) a day  Other Active Problems  Hx verterbral fx and humeral fx  Baseline dementia on namenda and aricept  Hospital day # 2  Neurology  will sign off. Please call with questions. Pt will follow up with stroke clinic NP at [facility] in about 4 weeks. Thanks for the consult.  Marvel PlanJindong Lillee Mooneyhan, MD PhD Stroke Neurology 03/07/2019 5:26 PM   To contact Stroke Continuity provider, please refer to [email or web address]. After hours, contact General Neurology

## 2019-03-07 NOTE — Discharge Summary (Signed)
Ana [name], is a 85 y.o. female  DOB Jul 27, 1926  MRN [id number].  Admission date:  03/05/2019  Admitting Physician  Drema Dallas, MD  Discharge Date:  03/07/2019   Primary MD  Mast, Man X, NP  Recommendations for primary care physician for things to follow:  -Please inform normal blood pressure in 5 to 7 days as allowing for permissive hypertension initially   Code Status: DNR  Admission Diagnosis  Stroke (cerebrum) (HCC) [I63.9] Cerebrovascular accident (CVA), unspecified mechanism (HCC) [I63.9]   Discharge Diagnosis  Stroke (cerebrum) (HCC) [I63.9] Cerebrovascular accident (CVA), unspecified mechanism (HCC) [I63.9]    Active Problems:   Anxiety   Hyperlipidemia   Hypertension   history of Fracture of right humerus   Memory loss   Balance problem   History of Fracture of right lower leg   Lumbar vertebral fracture (HCC)   Prediabetes   HLD (hyperlipidemia)   Stroke (cerebrum) (HCC)      Past Medical History:  Diagnosis Date   Allergic rhinitis    Anxiety    Balance problem 04/28/2015   Fatigue 04/28/2015   Hearing loss 04/28/2015   history of Fracture of right humerus 04/28/2015   HLD (hyperlipidemia) 03/05/2019   Hyperglycemia    Hyperlipidemia    Hypertension    Memory loss 04/28/2015   04/26/2014 MMSE 28/30. Failed clock drawing. 04/21/15 MMSE 21/30. Failed clock drawing.    Osteoporosis     Past Surgical History:  Procedure Laterality Date   ABDOMINAL HYSTERECTOMY     CATARACT EXTRACTION  2010   ELBOW SURGERY Right 1988   FEMUR FRACTURE SURGERY Right 06/2014   [place], [place] Dr. Dione Housekeeper   HUMERUS FRACTURE SURGERY Right 2015   [place], [place] Dr. Eber Hong   TONSILLECTOMY  [phone]       History of present illness and  Hospital Course:     Kindly see H&P for history of present illness and admission details, please review complete Labs,  Consult reports and Test reports for all details in brief  HPI  from the history and physical done on the day of admission 03/05/2019   HPI: Ana [name] is a 85 y.o.  WF PMHx dementia, anxiety, Hx of falls, lumbar vertebral fracture, Hx fracture RIGHT humerus, HTN, HLD,  presents to the emergency department for stroke symptoms since 2 days ago. Patient provides HPI as there are no family members at bedside. She reports that she has had weakness in her right arm and right leg for the past 2 days. She reports that she is having trouble finding her words and her words are "not coming out right". She reports that she thinks that she lives at home with her husband but she is not quite sure of this. She denies any current pain, cough, fever, bowel changes. She is able to correctly tell me the date and day of the week.  States lives at [facility] nursing facility with her husband room [facility] Course    85 year old  Caucasian female with a past medical history of dementia, anxiety disorder, hypertension, hyperlipidemia presented with complaints of weakness in the right arm and right leg ongoing for 2 days.  She also mentions difficulty speaking, trouble finding words and words not coming out right.  She apparently lives at friend's home [facility] nursing facility although level of care is not known.  Apparently her husband is also in the same facility.  Acute stroke, nonhemorrhagic MRI shows acute stroke involving the left pons.  Patient with dysarthria, right upper and lower extremity weakness.  Stroke work-up is in progress.  LDL 175.  Patient is on a statin.  HbA1c 5.9.  PT and OT evaluation.  Recommendation for SNF placement, MRA head with significant atherosclerosis, carotid Dopplers with no significant large vessel occlusion, as per my discussion with neurology she will be discharged on 3 weeks of dual antiplatelet therapy aspirin 81 mg oral daily, and Plavix, then to continue Plavix  alone.  Essential hypertension Allowing permissive hypertension.  Home medications, blood pressure on the higher side during hospital stay, no medication were initiated, can aim for normal blood pressure in 5 to 7 days  Hyperlipidemia LDL noted to be 175.  HDL 37.  Triglyceride 110.  Review of her home medications suggest that patient was not on cholesterol-lowering medication.  Started on atorvastatin here.  History of vertebral fractures and humeral fracture Not an active issue currently.  PT and OT evaluation.  History of dementia Continue donepezil and memantine.   Discharge Condition:  stable   Follow UP  Contact information for after-discharge care    Destination    [facility] .   Service:  Skilled Nursing Contact information: [address] [place] [address] [phone]                Discharge Instructions  and  Discharge Medications    Discharge Instructions    Discharge instructions   Complete by:  As directed    Follow with Primary MD Mast, Man X, NP in 7 days   Get CBC, CMP,checked  by Primary MD next visit.    Activity: As tolerated with Full fall precautions use walker/cane & assistance as needed   Disposition SNF   Diet: Heart Healthy  , with feeding assistance and aspiration precautions.  For Heart failure patients - Check your Weight same time everyday, if you gain over 2 pounds, or you develop in leg swelling, experience more shortness of breath or chest pain, call your Primary MD immediately. Follow Cardiac Low Salt Diet and 1.5 lit/day fluid restriction.   On your next visit with your primary care physician please Get Medicines reviewed and adjusted.   Please request your Prim.MD to go over all Hospital Tests and Procedure/Radiological results at the follow up, please get all Hospital records sent to your Prim MD by signing hospital release before you go home.   If you experience  worsening of your admission symptoms, develop shortness of breath, life threatening emergency, suicidal or homicidal thoughts you must seek medical attention immediately by calling 911 or calling your MD immediately  if symptoms less severe.  You Must read complete instructions/literature along with all the possible adverse reactions/side effects for all the Medicines you take and that have been prescribed to you. Take any new Medicines after you have completely understood and accpet all the possible adverse reactions/side effects.   Do not drive, operating heavy machinery, perform activities at heights, swimming or participation in water  activities or provide baby sitting services if your were admitted for syncope or siezures until you have seen by Primary MD or a Neurologist and advised to do so again.  Do not drive when taking Pain medications.    Do not take more than prescribed Pain, Sleep and Anxiety Medications  Special Instructions: If you have smoked or chewed Tobacco  in the last 2 yrs please stop smoking, stop any regular Alcohol  and or any Recreational drug use.  Wear Seat belts while driving.   Please note  You were cared for by a hospitalist during your hospital stay. If you have any questions about your discharge medications or the care you received while you were in the hospital after you are discharged, you can call the unit and asked to speak with the hospitalist on call if the hospitalist that took care of you is not available. Once you are discharged, your primary care physician will handle any further medical issues. Please note that NO REFILLS for any discharge medications will be authorized once you are discharged, as it is imperative that you return to your primary care physician (or establish a relationship with a primary care physician if you do not have one) for your aftercare needs so that they can reassess your need for medications and monitor your lab values.    Increase activity slowly   Complete by:  As directed      Allergies as of 03/07/2019      Reactions   Ace Inhibitors Other (See Comments)   Unknown reaction   Almond Oil Rash   Plum Pulp Rash      Medication List    TAKE these medications   aspirin 81 MG EC tablet Take 1 tablet (81 mg total) by mouth daily. Please give for 21 days then stop Start taking on:  March 08, 2019 What changed:    medication strength  how much to take  when to take this  reasons to take this  additional instructions   atorvastatin 40 MG tablet Commonly known as:  LIPITOR Take 1 tablet (40 mg total) by mouth daily at 6 PM.   calcium carbonate 1500 (600 Ca) MG Tabs tablet Commonly known as:  OSCAL Take 600 mg of elemental calcium by mouth daily with breakfast.   clopidogrel 75 MG tablet Commonly known as:  PLAVIX Take 1 tablet (75 mg total) by mouth daily. Start taking on:  March 08, 2019   donepezil 10 MG tablet Commonly known as:  ARICEPT TAKE ONE TABLET BY MOUTH DAILY TO PRESERVE MEMORY What changed:  See the new instructions.   Fish Oil 600 MG Caps Take 1 capsule by mouth daily.   memantine 10 MG tablet Commonly known as:  Namenda Take 1 tablet (10 mg total) by mouth 2 (two) times daily.   pantoprazole 40 MG tablet Commonly known as:  Protonix Take 1 tablet (40 mg total) by mouth daily.   Vitamin D3 25 MCG (1000 UT) Caps Take 1,000 Units daily by mouth.         Diet and Activity recommendation: See Discharge Instructions above   Consults obtained -  neurolgoy   Major procedures and Radiology Reports - PLEASE review detailed and final reports for all details, in brief -    Dg Chest 2 View  Result Date: 03/05/2019 CLINICAL DATA:  Acute CVA EXAM: CHEST - 2 VIEW COMPARISON:  January 20, 2018 FINDINGS: There is no appreciable edema or consolidation. Heart is borderline enlarged  with pulmonary vascularity normal. No adenopathy. There is aortic atherosclerosis. There  is degenerative change in the thoracic spine. IMPRESSION: Line cardiac enlargement. No edema or consolidation. Aortic Atherosclerosis (ICD10-I70.0). Electronically Signed   By: Bretta Bang III M.D.   On: 03/05/2019 18:04   Ct Head Wo Contrast  Result Date: 03/05/2019 CLINICAL DATA:  Right-sided weakness, slurred speech EXAM: CT HEAD WITHOUT CONTRAST TECHNIQUE: Contiguous axial images were obtained from the base of the skull through the vertex without intravenous contrast. COMPARISON:  02/28/2018 FINDINGS: Brain: No evidence of acute infarction, hemorrhage, hydrocephalus, extra-axial collection or mass lesion/mass effect. Periventricular white matter hypodensity. Vascular: No hyperdense vessel or unexpected calcification. Skull: Hyperostosis frontalis. Negative for fracture or focal lesion. Sinuses/Orbits: No acute finding. Other: None. IMPRESSION: No acute intracranial pathology.  Small-vessel white matter disease. Electronically Signed   By: Lauralyn Primes M.D.   On: 03/05/2019 12:29   Mr Brain Wo Contrast  Result Date: 03/05/2019 CLINICAL DATA:  Focal neurological deficit. Right sided weakness. Slurred speech. EXAM: MRI HEAD WITHOUT CONTRAST MRA HEAD WITHOUT CONTRAST TECHNIQUE: Multiplanar, multiecho pulse sequences of the brain and surrounding structures were obtained without intravenous contrast. Angiographic images of the head were obtained using MRA technique without contrast. COMPARISON:  Head CT earlier same day FINDINGS: MRI HEAD FINDINGS Brain: Acute infarction of the left para median pons. No other acute infarction. No swelling or hemorrhage. Elsewhere, there chronic small-vessel ischemic changes of the pons. No focal cerebellar insult. Cerebral hemispheres show moderate chronic small-vessel ischemic changes of the deep and subcortical white matter. No mass lesion, acute hemorrhage, hydrocephalus or extra-axial collection. There is some old hemosiderin deposition along the surface of the brain  in the right parieto-occipital region. Vascular: Major vessels at the base of the brain show flow. Skull and upper cervical spine: Negative Sinuses/Orbits: Clear/normal Other: None MRA HEAD FINDINGS Both internal carotid arteries are patent through the skull base and siphon regions. No flow limiting siphon stenosis suspected. The anterior and middle cerebral vessels are patent. There are stenoses in the MCA bifurcation regions, worse on the left than the right. Mild stenoses are seen in the A1 segments. Both vertebral arteries are patent to the basilar. There is mild atherosclerotic change of the basilar artery. There is a patent left posterior inferior cerebellar artery. There is a patent right anterior inferior cerebellar artery. Both superior cerebellar arteries show flow. Both posterior cerebral arteries are patent, though there are serial stenoses, with diminished visualization of the more distal branch vessels. IMPRESSION: Acute infarction of the left para median pons. No swelling or hemorrhage in that location. Chronic small-vessel ischemic changes elsewhere within the pons and within the hemispheric white matter. MR angiography shows extensive intracranial atherosclerotic disease, with stenoses in the MCA bifurcation regions left more than right and of the posterior cerebral arteries on both sides. No large vessel occlusion identified. Electronically Signed   By: Paulina Fusi M.D.   On: 03/05/2019 17:54   Mr Maxine Glenn Head Wo Contrast  Result Date: 03/05/2019 CLINICAL DATA:  Focal neurological deficit. Right sided weakness. Slurred speech. EXAM: MRI HEAD WITHOUT CONTRAST MRA HEAD WITHOUT CONTRAST TECHNIQUE: Multiplanar, multiecho pulse sequences of the brain and surrounding structures were obtained without intravenous contrast. Angiographic images of the head were obtained using MRA technique without contrast. COMPARISON:  Head CT earlier same day FINDINGS: MRI HEAD FINDINGS Brain: Acute infarction of the left  para median pons. No other acute infarction. No swelling or hemorrhage. Elsewhere, there chronic small-vessel ischemic changes of  the pons. No focal cerebellar insult. Cerebral hemispheres show moderate chronic small-vessel ischemic changes of the deep and subcortical white matter. No mass lesion, acute hemorrhage, hydrocephalus or extra-axial collection. There is some old hemosiderin deposition along the surface of the brain in the right parieto-occipital region. Vascular: Major vessels at the base of the brain show flow. Skull and upper cervical spine: Negative Sinuses/Orbits: Clear/normal Other: None MRA HEAD FINDINGS Both internal carotid arteries are patent through the skull base and siphon regions. No flow limiting siphon stenosis suspected. The anterior and middle cerebral vessels are patent. There are stenoses in the MCA bifurcation regions, worse on the left than the right. Mild stenoses are seen in the A1 segments. Both vertebral arteries are patent to the basilar. There is mild atherosclerotic change of the basilar artery. There is a patent left posterior inferior cerebellar artery. There is a patent right anterior inferior cerebellar artery. Both superior cerebellar arteries show flow. Both posterior cerebral arteries are patent, though there are serial stenoses, with diminished visualization of the more distal branch vessels. IMPRESSION: Acute infarction of the left para median pons. No swelling or hemorrhage in that location. Chronic small-vessel ischemic changes elsewhere within the pons and within the hemispheric white matter. MR angiography shows extensive intracranial atherosclerotic disease, with stenoses in the MCA bifurcation regions left more than right and of the posterior cerebral arteries on both sides. No large vessel occlusion identified. Electronically Signed   By: Paulina Fusi M.D.   On: 03/05/2019 17:54   Vas US Carotid (at [facility] Only)  Result Date: 03/06/2019 Carotid Arterial  Duplex Study Indications:  CVA. Risk Factors: Hypertension, hyperlipidemia. Performing Technologist: Blanch Media RVS  Examination Guidelines: A complete evaluation includes B-mode imaging, spectral Doppler, color Doppler, and power Doppler as needed of all accessible portions of each vessel. Bilateral testing is considered an integral part of a complete examination. Limited examinations for reoccurring indications may be performed as noted.  Right Carotid Findings: +----------+--------+--------+--------+------------+--------+             PSV cm/s EDV cm/s Stenosis Describe     Comments  +----------+--------+--------+--------+------------+--------+  CCA Prox   59       16                heterogenous           +----------+--------+--------+--------+------------+--------+  CCA Distal 54       7                 heterogenous           +----------+--------+--------+--------+------------+--------+  ICA Prox   83       30       1-39%    heterogenous           +----------+--------+--------+--------+------------+--------+  ICA Distal 100      27                                       +----------+--------+--------+--------+------------+--------+  ECA        121                                               +----------+--------+--------+--------+------------+--------+ +----------+--------+-------+--------+-------------------+             PSV cm/s  EDV cms Describe Arm Pressure (mmHG)  +----------+--------+-------+--------+-------------------+  Subclavian 110                                            +----------+--------+-------+--------+-------------------+ +---------+--------+--+--------+-+---------+  Vertebral PSV cm/s 34 EDV cm/s 8 Antegrade  +---------+--------+--+--------+-+---------+  Left Carotid Findings: +----------+--------+--------+--------+------------+--------+             PSV cm/s EDV cm/s Stenosis Describe     Comments  +----------+--------+--------+--------+------------+--------+  CCA Prox   84       14                 heterogenous           +----------+--------+--------+--------+------------+--------+  CCA Distal 74       18                heterogenous           +----------+--------+--------+--------+------------+--------+  ICA Prox   93       25       1-39%    heterogenous           +----------+--------+--------+--------+------------+--------+  ICA Distal 88       27                                       +----------+--------+--------+--------+------------+--------+  ECA        120                                               +----------+--------+--------+--------+------------+--------+ +----------+--------+--------+--------+-------------------+  Subclavian PSV cm/s EDV cm/s Describe Arm Pressure (mmHG)  +----------+--------+--------+--------+-------------------+             83                                              +----------+--------+--------+--------+-------------------+ +---------+--------+--+--------+-+---------+  Vertebral PSV cm/s 40 EDV cm/s 8 Antegrade  +---------+--------+--+--------+-+---------+  Summary: Right Carotid: Velocities in the right ICA are consistent with a 1-39% stenosis. Left Carotid: Velocities in the left ICA are consistent with a 1-39% stenosis. Vertebrals: Bilateral vertebral arteries demonstrate antegrade flow. *See table(s) above for measurements and observations.     Preliminary     Micro Results    No results found for this or any previous visit (from the past 240 hour(s)).     Today   Subjective:   Duffy Rhody today with significant dysarthria, she continues to have weakness in the right side, there is no significant events per staff .   Objective:   Blood pressure (!) 159/75, pulse 60, temperature 97.8 F (36.6 C), temperature source Oral, resp. rate 17, SpO2 97 %.   Intake/Output Summary (Last 24 hours) at 03/07/2019 1224 Last data filed at 03/07/2019 0802 Gross per 24 hour  Intake 1080 ml  Output 600 ml  Net 480 ml    Exam Awake Alert, pleasant,  cooperative, with significant dysarthria Symmetrical Chest wall movement, Good air movement bilaterally, CTAB RRR,No Gallops,Rubs or new Murmurs, No Parasternal Heave +ve B.Sounds, Abd Soft, Non tender,  No rebound -  guarding or rigidity. No Cyanosis, Clubbing or edema, No new Rash or bruise, she is with significant right-sided weakness  Data Review   CBC w Diff:  Lab Results  Component Value Date   WBC 8.9 03/07/2019   HGB 12.5 03/07/2019   HCT 39.0 03/07/2019   PLT 193 03/07/2019   LYMPHOPCT 34 03/05/2019   MONOPCT 8 03/05/2019   EOSPCT 1 03/05/2019   BASOPCT 1 03/05/2019    CMP:  Lab Results  Component Value Date   NA 141 03/07/2019   NA 141 01/20/2018   K 3.8 03/07/2019   K 4.1 01/20/2018   CL 108 03/07/2019   CO2 25 03/07/2019   BUN 20 03/07/2019   BUN 25 (A) 01/20/2018   CREATININE 0.91 03/07/2019   CREATININE 1.01 (H) 11/13/2018   GLU 102 02/09/2016   PROT 5.7 (L) 03/07/2019   PROT 6.1 01/20/2018   ALBUMIN 3.2 (L) 03/07/2019   ALBUMIN 3.6 01/20/2018   BILITOT 0.6 03/07/2019   BILITOT 0.6 01/20/2018   ALKPHOS 47 03/07/2019   ALKPHOS 59 01/20/2018   AST 19 03/07/2019   AST 13 01/20/2018   ALT 12 03/07/2019   ALT 9 01/20/2018  .   Total Time in preparing paper work, data evaluation and todays exam - 35 minutes  Huey Bienenstock M.D on 03/07/2019 at 12:24 PM  [facility]   Office  [phone]

## 2019-03-07 NOTE — TOC Transition Note (Signed)
Transition of Care [facility]) - CM/SW Discharge Note   Patient Details  Name: Ana [name] MRN: [id number] Date of Birth: Sep 04, 1926  [facility] CM/SW Contact:  Baldemar Lenis, LCSW Phone Number: 03/07/2019, 12:49 PM   Clinical Narrative:  Nurse to call report to [phone], Ask for Healthcare, Room 18     Final next level of care: Skilled Nursing Facility Barriers to Discharge: No Barriers Identified   Patient Goals and CMS Choice Patient states their goals for this hospitalization and ongoing recovery are:: to get better CMS Medicare.gov Compare Post Acute Care list provided to:: Patient Choice offered to / list presented to : Patient  Discharge Placement              Patient [facility] bed at: [facility] Patient to be transferred to facility by: [facility] Name of family member notified: [name] Patient and family notified of of transfer: 03/07/19  Discharge Plan and Services     Post Acute Care Choice: Skilled Nursing Facility                    Social Determinants of Health (SDOH) Interventions     Readmission Risk Interventions No flowsheet data found.

## 2019-03-07 NOTE — Discharge Instructions (Signed)
Follow with Primary MD Mast, Man X, NP in 7 days   Get CBC, CMP,checked  by Primary MD next visit.    Activity: As tolerated with Full fall precautions use walker/cane & assistance as needed   Disposition SNF   Diet: Heart Healthy  , with feeding assistance and aspiration precautions.  For Heart failure patients - Check your Weight same time everyday, if you gain over 2 pounds, or you develop in leg swelling, experience more shortness of breath or chest pain, call your Primary MD immediately. Follow Cardiac Low Salt Diet and 1.5 lit/day fluid restriction.   On your next visit with your primary care physician please Get Medicines reviewed and adjusted.   Please request your Prim.MD to go over all Hospital Tests and Procedure/Radiological results at the follow up, please get all Hospital records sent to your Prim MD by signing hospital release before you go home.   If you experience worsening of your admission symptoms, develop shortness of breath, life threatening emergency, suicidal or homicidal thoughts you must seek medical attention immediately by calling 911 or calling your MD immediately  if symptoms less severe.  You Must read complete instructions/literature along with all the possible adverse reactions/side effects for all the Medicines you take and that have been prescribed to you. Take any new Medicines after you have completely understood and accpet all the possible adverse reactions/side effects.   Do not drive, operating heavy machinery, perform activities at heights, swimming or participation in water activities or provide baby sitting services if your were admitted for syncope or siezures until you have seen by Primary MD or a Neurologist and advised to do so again.  Do not drive when taking Pain medications.    Do not take more than prescribed Pain, Sleep and Anxiety Medications  Special Instructions: If you have smoked or chewed Tobacco  in the last 2 yrs please stop  smoking, stop any regular Alcohol  and or any Recreational drug use.  Wear Seat belts while driving.   Please note  You were cared for by a hospitalist during your hospital stay. If you have any questions about your discharge medications or the care you received while you were in the hospital after you are discharged, you can call the unit and asked to speak with the hospitalist on call if the hospitalist that took care of you is not available. Once you are discharged, your primary care physician will handle any further medical issues. Please note that NO REFILLS for any discharge medications will be authorized once you are discharged, as it is imperative that you return to your primary care physician (or establish a relationship with a primary care physician if you do not have one) for your aftercare needs so that they can reassess your need for medications and monitor your lab values.

## 2019-03-07 NOTE — Plan of Care (Signed)
Adequate for discharge.

## 2019-03-20 ENCOUNTER — Non-Acute Institutional Stay (SKILLED_NURSING_FACILITY): Payer: Medicare Other | Admitting: Internal Medicine

## 2019-03-20 ENCOUNTER — Encounter: Payer: Self-pay | Admitting: Internal Medicine

## 2019-03-20 DIAGNOSIS — E7849 Other hyperlipidemia: Secondary | ICD-10-CM

## 2019-03-20 DIAGNOSIS — I10 Essential (primary) hypertension: Secondary | ICD-10-CM

## 2019-03-20 DIAGNOSIS — F0150 Vascular dementia without behavioral disturbance: Secondary | ICD-10-CM | POA: Insufficient documentation

## 2019-03-20 DIAGNOSIS — I6302 Cerebral infarction due to thrombosis of basilar artery: Secondary | ICD-10-CM | POA: Diagnosis not present

## 2019-03-20 DIAGNOSIS — F0390 Unspecified dementia without behavioral disturbance: Secondary | ICD-10-CM | POA: Diagnosis not present

## 2019-03-20 NOTE — Progress Notes (Signed)
Provider:  [name] MD Location:  [facility] Room Number: 18 Place of Service:  SNF ([phone])  PCP: [name], MD Patient Care Team: [name], MD as PCP - General (Internal Medicine) Ngetich, Donalee Citrin, NP as Nurse Practitioner [facility] Medicine)  Extended Emergency Contact Information Primary Emergency Contact: AnaWelch Address: [address]          [place], [place] [address] [place] Home Phone: [phone] Relation: Daughter Secondary Emergency Contact: Ana [name] Address: [address]          [place], [place] [address] [place] Home Phone: [phone] Relation: Spouse  Code Status:DNR Goals of Care: Advanced Directive information Advanced Directives 03/20/2019  Does Patient Have a Medical Advance Directive? Yes  Type of Estate agent of [place];Out of facility DNR (pink MOST or yellow form)  Does patient want to make changes to medical advance directive? No - Patient declined  Copy of Healthcare Power of Attorney in Chart? -  Pre-existing out of facility DNR order (yellow form or pink MOST form) Yellow form placed in chart (order not valid for inpatient use)      Chief Complaint  Patient presents with   New Admit To SNF    New admit to facility     HPI: Patient is a 85 y.o. female seen today [facility] for [facility] and Possible Long term Care Patient was admitted in the hospital from 03/23-03/25 for Acute CVA.  Patient has history of dementia, anxiety, history of right humerus fracture, hypertension and hyperlipidemia.  She was brought to ED 2 for evaluation of stroke symptoms.  Patient says that she noticed weakness in her right arm and right leg for few days.  She also noticed some aphasia. She had MRI done which showed acute stroke involving left pons.  Her MRI of the head did not show any atherosclerosis.  Carotid Dopplers did not show any occlusion.   Neurology recommended 3 weeks of dual antiplatelet therapy and then to continue on Plavix alone. Also started on statin for hyperlipidemia. Patient is now in SNF for therapy.  She did have few comments to nurses that she is depressed.  She told me she feels okay.  She is sleeping good through night, eating good and is working with therapy.  Did not have any acute problems. Patient lives with her husband in an independent unit.  He is her main caregiver.  She was independent in her ADLs.  Past Medical History:  Diagnosis Date   Allergic rhinitis    Anxiety    Balance problem 04/28/2015   Fatigue 04/28/2015   Hearing loss 04/28/2015   history of Fracture of right humerus 04/28/2015   HLD (hyperlipidemia) 03/05/2019   Hyperglycemia    Hyperlipidemia    Hypertension    Memory loss 04/28/2015   04/26/2014 MMSE 28/30. Failed clock drawing. 04/21/15 MMSE 21/30. Failed clock drawing.    Osteoporosis    Past Surgical History:  Procedure Laterality Date   ABDOMINAL HYSTERECTOMY     CATARACT EXTRACTION  2010   ELBOW SURGERY Right 1988   FEMUR FRACTURE SURGERY Right 06/2014   [place], [place] Dr. Dione Housekeeper   HUMERUS FRACTURE SURGERY Right 2015   [place], [place] Dr. Eber Hong   TONSILLECTOMY  [phone]    reports that she has never smoked. She has never used smokeless tobacco. She reports current alcohol use of about 1.0 - 2.0 standard drinks  of alcohol per week. She reports that she does not use drugs. Social History   Socioeconomic History   Marital status: Unknown    Spouse name: Not on file   Number of children: Not on file   Years of education: Not on file   Highest education level: Not on file  Occupational History   Occupation: Housewife  Social Needs   Financial resource strain: Not on file   Food insecurity:    Worry: Not on file    Inability: Not on file   Transportation needs:    Medical: Not on file    Non-medical: Not on file  Tobacco Use   Smoking  status: Never Smoker   Smokeless tobacco: Never Used  Substance and Sexual Activity   Alcohol use: Yes    Alcohol/week: 1.0 - 2.0 standard drinks    Types: 1 - 2 Glasses of wine per week    Comment: 1-2 glasses 1-2 times a week   Drug use: No   Sexual activity: Not on file  Lifestyle   Physical activity:    Days per week: Not on file    Minutes per session: Not on file   Stress: Not on file  Relationships   Social connections:    Talks on phone: Not on file    Gets together: Not on file    Attends religious service: Not on file    Active member of club or organization: Not on file    Attends meetings of clubs or organizations: Not on file    Relationship status: Not on file   Intimate partner violence:    Fear of current or ex partner: Not on file    Emotionally abused: Not on file    Physically abused: Not on file    Forced sexual activity: Not on file  Other Topics Concern   Not on file  Social History Narrative   [facility]   Married Ana HillDonald   Never smoked   Alcohol -wine 1-2 daily   Caffeine coffee 2 daily   Exercise walking   Walks with walker   Living Will, POA, DNR    Functional Status Survey:    Family History  Problem Relation Age of Onset   Heart disease Mother    Diabetes Daughter     Health Maintenance  Topic Date Due   DEXA SCAN  03/21/1993   INFLUENZA VACCINE  07/14/2019   TETANUS/TDAP  12/14/2023   PNA vac Low Risk Adult  Completed    Allergies  Allergen Reactions   Ace Inhibitors Other (See Comments)    Unknown reaction   Almond Oil Rash   Plum Pulp Rash    Outpatient Encounter Medications as of 03/20/2019  Medication Sig   aspirin EC 81 MG EC tablet Take 1 tablet (81 mg total) by mouth daily. Please give for 21 days then stop   atorvastatin (LIPITOR) 40 MG tablet Take 1 tablet (40 mg total) by mouth daily at 6 PM.   calcium carbonate (OSCAL) 1500 (600 Ca) MG TABS tablet Take 600 mg  of elemental calcium by mouth daily with breakfast.   Cholecalciferol (VITAMIN D3) 1000 UNITS CAPS Take 1,000 Units daily by mouth.    clopidogrel (PLAVIX) 75 MG tablet Take 1 tablet (75 mg total) by mouth daily.   donepezil (ARICEPT) 10 MG tablet TAKE ONE TABLET BY MOUTH DAILY TO PRESERVE MEMORY   memantine (NAMENDA) 10 MG tablet Take 1 tablet (10 mg  total) by mouth 2 (two) times daily.   Omega-3 Fatty Acids (FISH OIL) 500 MG CAPS Take 1 capsule by mouth daily.   pantoprazole (PROTONIX) 40 MG tablet Take 1 tablet (40 mg total) by mouth daily.   [DISCONTINUED] Omega-3 Fatty Acids (FISH OIL) 600 MG CAPS Take 1 capsule by mouth daily.   No facility-administered encounter medications on file as of 03/20/2019.     Review of Systems  Constitutional: Negative.   HENT: Negative.   Respiratory: Negative for cough and shortness of breath.   Cardiovascular: Negative.   Gastrointestinal: Negative.   Genitourinary: Negative.   Musculoskeletal: Positive for gait problem.  Skin: Negative.   Neurological: Positive for weakness.  Psychiatric/Behavioral: Positive for confusion. The patient is nervous/anxious.     Vitals:   03/20/19 0858  BP: 123/63  Pulse: 71  Resp: 20  Temp: 97.6 F (36.4 C)  SpO2: 96%  Weight: 126 lb 4.8 oz (57.3 kg)  Height: 5\' 2"  (1.575 m)   Body mass index is 23.1 kg/m. Physical Exam Vitals signs reviewed.  Constitutional:      Appearance: Normal appearance.  HENT:     Head: Normocephalic.     Nose: Nose normal.     Mouth/Throat:     Mouth: Mucous membranes are moist.     Pharynx: Oropharynx is clear.  Eyes:     Pupils: Pupils are equal, round, and reactive to light.  Cardiovascular:     Rate and Rhythm: Normal rate and regular rhythm.     Pulses: Normal pulses.     Heart sounds: Normal heart sounds.  Pulmonary:     Effort: Pulmonary effort is normal. No respiratory distress.     Breath sounds: Normal breath sounds. No wheezing or rales.  Abdominal:       General: Abdomen is flat. Bowel sounds are normal. There is no distension.     Palpations: Abdomen is soft.     Tenderness: There is no abdominal tenderness. There is no guarding.  Musculoskeletal:        General: No swelling.     Comments: Trace swelling in Right LE  Skin:    General: Skin is warm and dry.  Neurological:     Mental Status: She is alert.     Comments: Patient knew her DOB. And age She knew the year could not tell me the name of the place or her older Apartment Had 3-4 /5 weakness in Right UE and LE Good strength in Left Upper and lower extremity  Psychiatric:        Attention and Perception: Attention normal.        Mood and Affect: Mood is depressed.        Speech: Speech normal.        Cognition and Memory: Cognition is impaired.     Labs reviewed: Basic Metabolic Panel: Recent Labs    03/05/19 1156 03/06/19 0542 03/07/19 0457  NA 144 142 141  K 3.8 3.9 3.8  CL 109 107 108  CO2 26 26 25   GLUCOSE 96 98 105*  BUN 17 17 20   CREATININE 0.92 1.03* 0.91  CALCIUM 9.4 9.4 9.3   Liver Function Tests: Recent Labs    03/05/19 1156 03/06/19 0542 03/07/19 0457  AST 20 18 19   ALT 14 13 12   ALKPHOS 49 43 47  BILITOT 0.6 1.0 0.6  PROT 6.2* 5.7* 5.7*  ALBUMIN 3.6 3.2* 3.2*   No results for input(s): LIPASE, AMYLASE in the last 8760  hours. No results for input(s): AMMONIA in the last 8760 hours. CBC: Recent Labs    07/17/18 0815 03/05/19 1156 03/07/19 0457  WBC 9.8 9.2 8.9  NEUTROABS  --  5.2  --   HGB 13.6 13.0 12.5  HCT 41.2 40.0 39.0  MCV 94.9 99.5 96.8  PLT 223 209 193   Cardiac Enzymes: No results for input(s): CKTOTAL, CKMB, CKMBINDEX, TROPONINI in the last 8760 hours. BNP: Invalid input(s): POCBNP Lab Results  Component Value Date   HGBA1C 5.9 (H) 03/06/2019   Lab Results  Component Value Date   TSH 1.95 11/13/2018   Lab Results  Component Value Date   VITAMINB12 1,269 (H) 07/17/2018   No results found for: FOLATE No  results found for: IRON, TIBC, FERRITIN  Imaging and Procedures obtained prior to SNF admission: Dg Chest 2 View  Result Date: 03/05/2019 CLINICAL DATA:  Acute CVA EXAM: CHEST - 2 VIEW COMPARISON:  January 20, 2018 FINDINGS: There is no appreciable edema or consolidation. Heart is borderline enlarged with pulmonary vascularity normal. No adenopathy. There is aortic atherosclerosis. There is degenerative change in the thoracic spine. IMPRESSION: Line cardiac enlargement. No edema or consolidation. Aortic Atherosclerosis (ICD10-I70.0). Electronically Signed   By: Bretta Bang III M.D.   On: 03/05/2019 18:04   Ct Head Wo Contrast  Result Date: 03/05/2019 CLINICAL DATA:  Right-sided weakness, slurred speech EXAM: CT HEAD WITHOUT CONTRAST TECHNIQUE: Contiguous axial images were obtained from the base of the skull through the vertex without intravenous contrast. COMPARISON:  02/28/2018 FINDINGS: Brain: No evidence of acute infarction, hemorrhage, hydrocephalus, extra-axial collection or mass lesion/mass effect. Periventricular white matter hypodensity. Vascular: No hyperdense vessel or unexpected calcification. Skull: Hyperostosis frontalis. Negative for fracture or focal lesion. Sinuses/Orbits: No acute finding. Other: None. IMPRESSION: No acute intracranial pathology.  Small-vessel white matter disease. Electronically Signed   By: Lauralyn Primes M.D.   On: 03/05/2019 12:29   Mr Brain Wo Contrast  Result Date: 03/05/2019 CLINICAL DATA:  Focal neurological deficit. Right sided weakness. Slurred speech. EXAM: MRI HEAD WITHOUT CONTRAST MRA HEAD WITHOUT CONTRAST TECHNIQUE: Multiplanar, multiecho pulse sequences of the brain and surrounding structures were obtained without intravenous contrast. Angiographic images of the head were obtained using MRA technique without contrast. COMPARISON:  Head CT earlier same day FINDINGS: MRI HEAD FINDINGS Brain: Acute infarction of the left para median pons. No other acute  infarction. No swelling or hemorrhage. Elsewhere, there chronic small-vessel ischemic changes of the pons. No focal cerebellar insult. Cerebral hemispheres show moderate chronic small-vessel ischemic changes of the deep and subcortical white matter. No mass lesion, acute hemorrhage, hydrocephalus or extra-axial collection. There is some old hemosiderin deposition along the surface of the brain in the right parieto-occipital region. Vascular: Major vessels at the base of the brain show flow. Skull and upper cervical spine: Negative Sinuses/Orbits: Clear/normal Other: None MRA HEAD FINDINGS Both internal carotid arteries are patent through the skull base and siphon regions. No flow limiting siphon stenosis suspected. The anterior and middle cerebral vessels are patent. There are stenoses in the MCA bifurcation regions, worse on the left than the right. Mild stenoses are seen in the A1 segments. Both vertebral arteries are patent to the basilar. There is mild atherosclerotic change of the basilar artery. There is a patent left posterior inferior cerebellar artery. There is a patent right anterior inferior cerebellar artery. Both superior cerebellar arteries show flow. Both posterior cerebral arteries are patent, though there are serial stenoses, with diminished visualization of the  more distal branch vessels. IMPRESSION: Acute infarction of the left para median pons. No swelling or hemorrhage in that location. Chronic small-vessel ischemic changes elsewhere within the pons and within the hemispheric white matter. MR angiography shows extensive intracranial atherosclerotic disease, with stenoses in the MCA bifurcation regions left more than right and of the posterior cerebral arteries on both sides. No large vessel occlusion identified. Electronically Signed   By: Paulina Fusi M.D.   On: 03/05/2019 17:54   Mr Maxine Glenn Head Wo Contrast  Result Date: 03/05/2019 CLINICAL DATA:  Focal neurological deficit. Right sided  weakness. Slurred speech. EXAM: MRI HEAD WITHOUT CONTRAST MRA HEAD WITHOUT CONTRAST TECHNIQUE: Multiplanar, multiecho pulse sequences of the brain and surrounding structures were obtained without intravenous contrast. Angiographic images of the head were obtained using MRA technique without contrast. COMPARISON:  Head CT earlier same day FINDINGS: MRI HEAD FINDINGS Brain: Acute infarction of the left para median pons. No other acute infarction. No swelling or hemorrhage. Elsewhere, there chronic small-vessel ischemic changes of the pons. No focal cerebellar insult. Cerebral hemispheres show moderate chronic small-vessel ischemic changes of the deep and subcortical white matter. No mass lesion, acute hemorrhage, hydrocephalus or extra-axial collection. There is some old hemosiderin deposition along the surface of the brain in the right parieto-occipital region. Vascular: Major vessels at the base of the brain show flow. Skull and upper cervical spine: Negative Sinuses/Orbits: Clear/normal Other: None MRA HEAD FINDINGS Both internal carotid arteries are patent through the skull base and siphon regions. No flow limiting siphon stenosis suspected. The anterior and middle cerebral vessels are patent. There are stenoses in the MCA bifurcation regions, worse on the left than the right. Mild stenoses are seen in the A1 segments. Both vertebral arteries are patent to the basilar. There is mild atherosclerotic change of the basilar artery. There is a patent left posterior inferior cerebellar artery. There is a patent right anterior inferior cerebellar artery. Both superior cerebellar arteries show flow. Both posterior cerebral arteries are patent, though there are serial stenoses, with diminished visualization of the more distal branch vessels. IMPRESSION: Acute infarction of the left para median pons. No swelling or hemorrhage in that location. Chronic small-vessel ischemic changes elsewhere within the pons and within the  hemispheric white matter. MR angiography shows extensive intracranial atherosclerotic disease, with stenoses in the MCA bifurcation regions left more than right and of the posterior cerebral arteries on both sides. No large vessel occlusion identified. Electronically Signed   By: Paulina Fusi M.D.   On: 03/05/2019 17:54   Vas US Carotid (at [facility])  Result Date: 03/07/2019 Carotid Arterial Duplex Study Indications:  CVA. Risk Factors: Hypertension, hyperlipidemia. Performing Technologist: Blanch Media RVS  Examination Guidelines: A complete evaluation includes B-mode imaging, spectral Doppler, color Doppler, and power Doppler as needed of all accessible portions of each vessel. Bilateral testing is considered an integral part of a complete examination. Limited examinations for reoccurring indications may be performed as noted.  Right Carotid Findings: +----------+--------+--------+--------+------------+--------+             PSV cm/s EDV cm/s Stenosis Describe     Comments  +----------+--------+--------+--------+------------+--------+  CCA Prox   59       16                heterogenous           +----------+--------+--------+--------+------------+--------+  CCA Distal 54       7  heterogenous           +----------+--------+--------+--------+------------+--------+  ICA Prox   83       30       1-39%    heterogenous           +----------+--------+--------+--------+------------+--------+  ICA Distal 100      27                                       +----------+--------+--------+--------+------------+--------+  ECA        121                                               +----------+--------+--------+--------+------------+--------+ +----------+--------+-------+--------+-------------------+             PSV cm/s EDV cms Describe Arm Pressure (mmHG)  +----------+--------+-------+--------+-------------------+  Subclavian 110                                             +----------+--------+-------+--------+-------------------+ +---------+--------+--+--------+-+---------+  Vertebral PSV cm/s 34 EDV cm/s 8 Antegrade  +---------+--------+--+--------+-+---------+  Left Carotid Findings: +----------+--------+--------+--------+------------+--------+             PSV cm/s EDV cm/s Stenosis Describe     Comments  +----------+--------+--------+--------+------------+--------+  CCA Prox   84       14                heterogenous           +----------+--------+--------+--------+------------+--------+  CCA Distal 74       18                heterogenous           +----------+--------+--------+--------+------------+--------+  ICA Prox   93       25       1-39%    heterogenous           +----------+--------+--------+--------+------------+--------+  ICA Distal 88       27                                       +----------+--------+--------+--------+------------+--------+  ECA        120                                               +----------+--------+--------+--------+------------+--------+ +----------+--------+--------+--------+-------------------+  Subclavian PSV cm/s EDV cm/s Describe Arm Pressure (mmHG)  +----------+--------+--------+--------+-------------------+             83                                              +----------+--------+--------+--------+-------------------+ +---------+--------+--+--------+-+---------+  Vertebral PSV cm/s 40 EDV cm/s 8 Antegrade  +---------+--------+--+--------+-+---------+  Summary: Right Carotid: Velocities in the right ICA are consistent with a 1-39% stenosis. Left Carotid: Velocities in the left ICA are consistent with a 1-39% stenosis. Vertebrals: Bilateral  vertebral arteries demonstrate antegrade flow. *See table(s) above for measurements and observations.  Electronically signed by Delia Heady MD on 03/07/2019 at 1:12:25 PM.    Final     Assessment/Plan  Acute CVA On dual therapy for 3 weeks then only Plavix Work up including Echo and  Carotids was negative On Statin now BP controlled Therapy both OT/PT  Hypertension Not on Any Meds Seem controlled here in facility Hyperlipidemia  On Statin Repeat Fasting Lipid after 3 months on statin Goal LDL less then 70 Dementia Continue Aricept and Namenda Depression Will Start her on Zoloft 25 mg Increase if necessary  Family/ staff Communication:   Labs/tests ordered:  Total time spent in this patient care encounter was 45_ minutes; greater than 50% of the visit spent counseling patient, reviewing records , Labs and coordinating care for problems addressed at this encounter.

## 2019-04-09 ENCOUNTER — Telehealth: Payer: Self-pay | Admitting: Adult Health

## 2019-04-09 NOTE — Telephone Encounter (Signed)
LVM to discuss changing appt to a VV

## 2019-04-12 ENCOUNTER — Inpatient Hospital Stay: Payer: Medicare Other | Admitting: Adult Health

## 2019-04-12 NOTE — Telephone Encounter (Signed)
Due to current COVID 19 pandemic, our office is severely reducing in office visits for at least the next 2 weeks, in order to minimize the risk to our patients and healthcare providers. Pt understands that although there may be some limitations with this type of visit, we will take all precautions to reduce any security or privacy concerns.  Pt understands that this will be treated like an in office visit and we will file with pt's insurance, and there may be a patient responsible charge related to this service.  Spoke with Raiford Simmonds with [facility] to schedule pt for a doxy.me appt- Pt agreed to appt. Email sent to [email or web address]

## 2019-04-16 NOTE — Telephone Encounter (Signed)
Email sent to [facility] for [facility] video at [facility] for pt visit.

## 2019-04-17 ENCOUNTER — Encounter: Payer: Self-pay | Admitting: Adult Health

## 2019-04-17 ENCOUNTER — Non-Acute Institutional Stay (SKILLED_NURSING_FACILITY): Payer: Medicare Other | Admitting: Nurse Practitioner

## 2019-04-17 ENCOUNTER — Encounter: Payer: Self-pay | Admitting: Nurse Practitioner

## 2019-04-17 ENCOUNTER — Ambulatory Visit (INDEPENDENT_AMBULATORY_CARE_PROVIDER_SITE_OTHER): Payer: Medicare Other | Admitting: Adult Health

## 2019-04-17 ENCOUNTER — Other Ambulatory Visit: Payer: Self-pay

## 2019-04-17 DIAGNOSIS — K219 Gastro-esophageal reflux disease without esophagitis: Secondary | ICD-10-CM | POA: Diagnosis not present

## 2019-04-17 DIAGNOSIS — F0390 Unspecified dementia without behavioral disturbance: Secondary | ICD-10-CM

## 2019-04-17 DIAGNOSIS — I63512 Cerebral infarction due to unspecified occlusion or stenosis of left middle cerebral artery: Secondary | ICD-10-CM

## 2019-04-17 DIAGNOSIS — I10 Essential (primary) hypertension: Secondary | ICD-10-CM

## 2019-04-17 DIAGNOSIS — E7849 Other hyperlipidemia: Secondary | ICD-10-CM

## 2019-04-17 DIAGNOSIS — F419 Anxiety disorder, unspecified: Secondary | ICD-10-CM | POA: Diagnosis not present

## 2019-04-17 NOTE — Assessment & Plan Note (Signed)
Continue SNF [facility] for safety and care assistance, continue Memantine 10mg  bid, Donepezil 10mg  qd for memory.

## 2019-04-17 NOTE — Assessment & Plan Note (Signed)
Stable, continue Pantoprazole 40mg qd.  

## 2019-04-17 NOTE — Assessment & Plan Note (Signed)
Her mood is stable, continue Sertraline 25mg qd.  

## 2019-04-17 NOTE — Progress Notes (Signed)
Location:   [facility] Room Number: 18/A Place of Service:  SNF (31) Provider: [facility] Bralen Wiltgen NP  [name], MD  Patient Care Team: [name], MD as PCP - General (Internal Medicine) Ngetich, Donalee Citrin, NP as Nurse Practitioner [facility])  Extended Emergency Contact Information Primary Emergency Contact: Gordan,[place] Address: [address]          [place], [place] [address] [place] of [place] Home Phone: [phone] Relation: Daughter Secondary Emergency Contact: Iven Finn Address: [address]          [place], [place] [address] [place] of [place] Home Phone: [phone] Relation: Spouse  Code Status:  DNR Goals of care: Advanced Directive information Advanced Directives 04/17/2019  Does Patient Have a Medical Advance Directive? Yes  Type of Estate agent of [place];Out of facility DNR (pink MOST or yellow form)  Does patient want to make changes to medical advance directive? No - Patient declined  Copy of Healthcare Power of Attorney in Chart? Yes - validated most recent copy scanned in chart (See row information)  Pre-existing out of facility DNR order (yellow form or pink MOST form) Yellow form placed in chart (order not valid for inpatient use)     Chief Complaint  Patient presents with  . Medical Management of Chronic Issues    Routine visit   . Health Maintenance    due for dexa scan    HPI:  Pt is a 85 y.o. female seen today for medical management of chronic diseases.    The patient resides in SNF [facility] for safety and care assistance, on Memantine 10mg  bid, Donepezil 10mg . Her mood is stable, on Sertraline 25mg  qd. GERD, stable on Pantoprazole 40mg  qd.    Past Medical History:  Diagnosis Date  . Allergic rhinitis   . Anxiety   . Balance problem 04/28/2015  . Fatigue 04/28/2015  . Hearing loss 04/28/2015  . history of Fracture of right humerus 04/28/2015  . HLD (hyperlipidemia) 03/05/2019  .  Hyperglycemia   . Hyperlipidemia   . Hypertension   . Memory loss 04/28/2015   04/26/2014 MMSE 28/30. Failed clock drawing. 04/21/15 MMSE 21/30. Failed clock drawing.   . Osteoporosis    Past Surgical History:  Procedure Laterality Date  . ABDOMINAL HYSTERECTOMY    . CATARACT EXTRACTION  2010  . ELBOW SURGERY Right 1988  . FEMUR FRACTURE SURGERY Right 06/2014   [place], [place] Dr. Dione Housekeeper  . HUMERUS FRACTURE SURGERY Right 2015   [place], [place] Dr. Eber Hong  . TONSILLECTOMY  1935    Allergies  Allergen Reactions  . Ace Inhibitors Other (See Comments)    Unknown reaction  . Almond Oil Rash  . Plum Pulp Rash    Allergies as of 04/17/2019      Reactions   Ace Inhibitors Other (See Comments)   Unknown reaction   Almond Oil Rash   Plum Pulp Rash      Medication List       Accurate as of Apr 17, 2019 11:59 PM. Always use your most recent med list.        atorvastatin 40 MG tablet Commonly known as:  LIPITOR Take 1 tablet (40 mg total) by mouth daily at 6 PM.   calcium carbonate 1500 (600 Ca) MG Tabs tablet Commonly known as:  OSCAL Take 600 mg of elemental calcium by mouth daily with breakfast.   clopidogrel 75 MG tablet Commonly known as:  PLAVIX Take 1 tablet (  75 mg total) by mouth daily.   donepezil 10 MG tablet Commonly known as:  ARICEPT TAKE ONE TABLET BY MOUTH DAILY TO PRESERVE MEMORY   Fish Oil 500 MG Caps Take 1 capsule by mouth daily.   memantine 10 MG tablet Commonly known as:  Namenda Take 1 tablet (10 mg total) by mouth 2 (two) times daily.   pantoprazole 40 MG tablet Commonly known as:  Protonix Take 1 tablet (40 mg total) by mouth daily.   sertraline 25 MG tablet Commonly known as:  ZOLOFT Take 25 mg by mouth daily.   Vitamin D3 25 MCG (1000 UT) Caps Take 1,000 Units daily by mouth.   zinc oxide 20 % ointment Apply 1 application topically as needed for irritation. Apply to peri/buttocks after every incontinent episode      ROS  was provided with assistance of staff Review of Systems  Constitutional: Negative for activity change, appetite change, chills, diaphoresis, fatigue, fever and unexpected weight change.  HENT: Positive for hearing loss. Negative for congestion and voice change.   Respiratory: Negative for cough, shortness of breath and wheezing.   Cardiovascular: Positive for leg swelling. Negative for chest pain and palpitations.  Gastrointestinal: Negative for abdominal distention, abdominal pain, constipation, diarrhea, nausea and vomiting.  Genitourinary: Negative for difficulty urinating, dysuria and urgency.  Musculoskeletal: Positive for gait problem.  Skin: Negative for color change and pallor.  Neurological: Positive for weakness. Negative for dizziness, speech difficulty and headaches.       Dementia, right sided weakness  Psychiatric/Behavioral: Negative for agitation, hallucinations and sleep disturbance. The patient is nervous/anxious.     Immunization History  Administered Date(s) Administered  . DTaP 05/12/2014  . Influenza, High Dose Seasonal PF 09/21/2017  . Influenza,inj,Quad PF,6+ Mos 09/14/2018  . Influenza-Unspecified 10/07/2014, 09/11/2015, 09/23/2016  . PPD Test 10/16/2014  . Pneumococcal Conjugate-13 10/20/2017  . Pneumococcal Polysaccharide-23 08/20/2011   Pertinent  Health Maintenance Due  Topic Date Due  . DEXA SCAN  03/21/1993  . INFLUENZA VACCINE  07/14/2019  . PNA vac Low Risk Adult  Completed   Fall Risk  11/21/2018 10/14/2017 02/17/2016 01/06/2015  Falls in the past year? 0 No No Yes  Comment - - - fx (R) humerus; fx (R) femur  Number falls in past yr: 0 - - 2 or more  Injury with Fall? 0 - - -  Risk for fall due to : - - - History of fall(s)   Functional Status Survey:    Vitals:   04/17/19 1238  BP: 124/71  Pulse: 69  Resp: 20  Temp: 98.1 F (36.7 C)  SpO2: 96%  Weight: 126 lb 12.8 oz (57.5 kg)  Height: 5\' 2"  (1.575 m)   Body mass index is 23.19 kg/m.  Physical Exam Vitals signs and nursing note reviewed.  Constitutional:      General: She is not in acute distress.    Appearance: Normal appearance. She is normal weight. She is not ill-appearing or toxic-appearing.  HENT:     Head: Normocephalic and atraumatic.     Nose: Nose normal. No congestion or rhinorrhea.     Mouth/Throat:     Mouth: Mucous membranes are moist.  Eyes:     Extraocular Movements: Extraocular movements intact.     Conjunctiva/sclera: Conjunctivae normal.     Pupils: Pupils are equal, round, and reactive to light.  Neck:     Musculoskeletal: Normal range of motion and neck supple.  Cardiovascular:     Rate and Rhythm:  Normal rate and regular rhythm.     Heart sounds: No murmur.  Pulmonary:     Effort: Pulmonary effort is normal.     Breath sounds: No wheezing, rhonchi or rales.  Abdominal:     General: There is no distension.     Palpations: Abdomen is soft.     Tenderness: There is no abdominal tenderness. There is no right CVA tenderness, left CVA tenderness, guarding or rebound.  Musculoskeletal:     Right lower leg: Edema present.     Left lower leg: Edema present.     Comments: Trace edema  BLE, right sided weakness.   Skin:    General: Skin is warm and dry.  Neurological:     General: No focal deficit present.     Mental Status: She is alert. Mental status is at baseline.     Motor: Weakness present.     Coordination: Coordination normal.     Gait: Gait abnormal.     Comments: Oriented to person and her room on unit. Right sided weakness.   Psychiatric:        Mood and Affect: Mood normal.        Behavior: Behavior normal.     Labs reviewed: Recent Labs    03/05/19 1156 03/06/19 0542 03/07/19 0457  NA 144 142 141  K 3.8 3.9 3.8  CL 109 107 108  CO2 GLUCOSE 96 98 105*  BUN CREATININE 0.92 1.03* 0.91  CALCIUM 9.4 9.4 9.3   Recent Labs    03/05/19 1156 03/06/19 0542 03/07/19 0457  AST ALT ALKPHOS 49 43 47  BILITOT 0.6 1.0 0.6  PROT 6.2* 5.7* 5.7*  ALBUMIN 3.6 3.2* 3.2*   Recent Labs    07/17/18 0815 03/05/19 1156 03/07/19 0457  WBC 9.8 9.2 8.9  NEUTROABS  --  5.2  --   HGB 13.6 13.0 12.5  HCT 41.2 40.0 39.0  MCV 94.9 99.5 96.8  PLT 223 209 193   Lab Results  Component Value Date   TSH 1.95 11/13/2018   Lab Results  Component Value Date   HGBA1C 5.9 (H) 03/06/2019   Lab Results  Component Value Date   CHOL 234 (H) 03/06/2019   HDL 37 (L) 03/06/2019   LDLCALC 175 (H) 03/06/2019   TRIG 110 03/06/2019   CHOLHDL 6.3 03/06/2019    Significant Diagnostic Results in last 30 days:  No results found.  Assessment/Plan Dementia (HCC) Continue SNF [facility] for safety and care assistance, continue Memantine  bid, Donepezil  qd for memory.   Anxiety Her mood is stable, continue Sertraline  qd.   GERD (gastroesophageal reflux disease) Stable, continue Pantoprazole  qd.    Family/ staff Communication: plan of care reviewed with the patient and charge nurse.   Labs/tests ordered: none  Time spend 25 minutes

## 2019-04-17 NOTE — Progress Notes (Signed)
[facility] [address] [place]. [place] [address] [phone]       VIRTUAL VISIT FOLLOW UP NOTE  [name] Date of Birth:  Feb 15, 1926 Medical Record Number:  [id number]   Reason for Referral:  hospital stroke follow up    Virtual Visit via telephone  I connected with Ana [name] on 04/17/19 at 10:15 AM EDT by telephone due to difficulty enabling video visit via [email or web address]. Provider located remotely in my own home and verified that I am speaking with the correct person using two identifiers who was located at [facility] and accompanied by Ana East, RN (supervisor).   Visit scheduled by Ana Patella, RN. She discussed the limitations of evaluation and management by telemedicine and the availability of in person appointments. The patient expressed understanding and agreed to proceed.Please see telephone note for additional scheduling information and consent.    CHIEF COMPLAINT:  Chief Complaint  Patient presents with   Follow-up    Hospital stroke follow-up    HPI: Ana [name] was initially scheduled today for in office hospital follow-up regarding left paramedian pontine infarct secondary to small vessel disease on 03/06/2019 but due to COVID-19 safety precautions, visit transition to telemedicine via [email or web address] with patients consent.  Unfortunately, difficulty with Internet connection within SNF facility therefore transitioned visit to telephone visit.  History obtained from patient, nursing staff and chart review. Reviewed all radiology images and labs personally.  Ana [name] is a 85 y.o. female with history of dementia, anxiety, HTN, HLD, falls, and osteoporosis  who presented from SNF with R sided weakness and difficulty with speech x 2 days.  CT head reviewed and was negative for acute stroke.  MRI brain reviewed and showed left paramedian pontine infarct along with small vessel disease.  MRA showed  extensive intracranial arthrosclerosis and L>R MCA bifurcations and bilateral PCAs.  Carotid Dopplers unremarkable.  2D echo showed an EF of 50 to 55% without cardiac source of embolus.  LDL 175 and A1c 5.9.  Infarcts secondary to small vessel disease and recommended DAPT for 3 weeks and Plavix alone along with initiating atorvastatin 40 mg daily.  HTN stable.  Other stroke risk factors include advanced age and EtOH use but no prior history of stroke.  Residual deficits of right-sided weakness and mild dysarthria and was discharged back to SNF in stable condition. She is [facility] residing [facility] for [facility] care and therapy.  She was previously living in the independent section of friends home with her husband.  She continues to receive PT/OT/ST for ongoing residual deficits of right-sided weakness and dysarthria.  Is able to ambulate with a RW short distance and uses wheelchair for long distance. Dysarthria per nurse is "glitchy" and slurred but does note improvement.  She does need assistance with bathing and dressing and staff is unsure if she will be returning to independent living.  She has completed 3 weeks DAPT and continues on Plavix alone without side effects bleeding or bruising.  Continues on atorvastatin without side effects myalgias.  Blood pressure obtained yesterday, 04/16/2019, with reading at 124/71.  Underlying dementia without any worsening.  Denies new or worsening stroke/TIA symptoms.   ROS:   14 system review of systems performed and negative with exception of memory loss, confusion, speech difficulty and weakness  PMH:  Past Medical History:  Diagnosis Date   Allergic rhinitis    Anxiety    Balance problem 04/28/2015   Fatigue  04/28/2015   Hearing loss 04/28/2015   history of Fracture of right humerus 04/28/2015   HLD (hyperlipidemia) 03/05/2019   Hyperglycemia    Hyperlipidemia    Hypertension    Memory loss 04/28/2015   04/26/2014 MMSE 28/30. Failed  clock drawing. 04/21/15 MMSE 21/30. Failed clock drawing.    Osteoporosis     PSH:  Past Surgical History:  Procedure Laterality Date   ABDOMINAL HYSTERECTOMY     CATARACT EXTRACTION  2010   ELBOW SURGERY Right 1988   FEMUR FRACTURE SURGERY Right 06/2014   [place], [place] Dr. Dione Housekeeper   HUMERUS FRACTURE SURGERY Right 2015   [place], [place] Dr. Eber Hong   TONSILLECTOMY  [phone]    Social History:  Social History   Socioeconomic History   Marital status: Unknown    Spouse name: Not on file   Number of children: Not on file   Years of education: Not on file   Highest education level: Not on file  Occupational History   Occupation: Housewife  Social Needs   Financial resource strain: Not on file   Food insecurity:    Worry: Not on file    Inability: Not on file   Transportation needs:    Medical: Not on file    Non-medical: Not on file  Tobacco Use   Smoking status: Never Smoker   Smokeless tobacco: Never Used  Substance and Sexual Activity   Alcohol use: Yes    Alcohol/week: 1.0 - 2.0 standard drinks    Types: 1 - 2 Glasses of wine per week    Comment: 1-2 glasses 1-2 times a week   Drug use: No   Sexual activity: Not on file  Lifestyle   Physical activity:    Days per week: Not on file    Minutes per session: Not on file   Stress: Not on file  Relationships   Social connections:    Talks on phone: Not on file    Gets together: Not on file    Attends religious service: Not on file    Active member of club or organization: Not on file    Attends meetings of clubs or organizations: Not on file    Relationship status: Not on file   Intimate partner violence:    Fear of current or ex partner: Not on file    Emotionally abused: Not on file    Physically abused: Not on file    Forced sexual activity: Not on file  Other Topics Concern   Not on file  Social History Narrative   [facility]   Married Ana [name]    Never smoked   Alcohol -wine 1-2 daily   Caffeine coffee 2 daily   Exercise walking   Walks with walker   Living Will, POA, DNR    Family History:  Family History  Problem Relation Age of Onset   Heart disease Mother    Diabetes Daughter     Medications:   Current Outpatient Medications on File Prior to Visit  Medication Sig Dispense Refill   atorvastatin (LIPITOR) 40 MG tablet Take 1 tablet (40 mg total) by mouth daily at 6 PM.     calcium carbonate (OSCAL) 1500 (600 Ca) MG TABS tablet Take 600 mg of elemental calcium by mouth daily with breakfast.     Cholecalciferol (VITAMIN D3) 1000 UNITS CAPS Take 1,000 Units daily by mouth.      clopidogrel (PLAVIX) 75 MG tablet Take 1  tablet (75 mg total) by mouth daily.     donepezil (ARICEPT) 10 MG tablet TAKE ONE TABLET BY MOUTH DAILY TO PRESERVE MEMORY 90 tablet 1   memantine (NAMENDA) 10 MG tablet Take 1 tablet (10 mg total) by mouth 2 (two) times daily. 180 tablet 3   Omega-3 Fatty Acids (FISH OIL) 500 MG CAPS Take 1 capsule by mouth daily.     pantoprazole (PROTONIX) 40 MG tablet Take 1 tablet (40 mg total) by mouth daily.     No current facility-administered medications on file prior to visit.     Allergies:   Allergies  Allergen Reactions   Ace Inhibitors Other (See Comments)    Unknown reaction   Almond Oil Rash   Plum Pulp Rash     Physical Exam Limited exam due to visit type   Depression screen [facility] 2/9 04/17/2019  Decreased Interest 0  Down, Depressed, Hopeless 0  PHQ - 2 Score 0  Altered sleeping -  Tired, decreased energy -  Change in appetite -  Feeling bad or failure about yourself  -  Trouble concentrating -  Moving slowly or fidgety/restless -  Suicidal thoughts -  PHQ-9 Score -     General: well developed, well nourished, seated, in no evident distress  Neurologic Exam Mental Status: Awake and fully alert. Disoriented to place and time. Recent and remote memory diminished. Attention  span, concentration and fund of knowledge diminished. Mood and affect appropriate and cooperative with visit.    NIHSS UTA Modified Rankin  3-4   Diagnostic Data (Labs, Imaging, Testing)  CT HEAD WO CONTRAST 03/05/2019 IMPRESSION: No acute intracranial pathology.  Small-vessel white matter disease.  MR BRAIN WO CONTRAST MR MRA HEAD  03/05/2019 IMPRESSION: Acute infarction of the left para median pons. No swelling or hemorrhage in that location. Chronic small-vessel ischemic changes elsewhere within the pons and within the hemispheric white matter. MR angiography shows extensive intracranial atherosclerotic disease, with stenoses in the MCA bifurcation regions left more than right and of the posterior cerebral arteries on both sides. No large vessel occlusion identified.   ECHOCARDIOGRAM 03/06/2019 IMPRESSIONS  1. The left ventricle has low normal systolic function, with an ejection fraction of 50-55%. The cavity size was normal. There is mildly increased left ventricular wall thickness. Left ventricular diastolic Doppler parameters are consistent with  impaired relaxation. Indeterminate filling pressures The E/e' is 8-15. No evidence of left ventricular regional wall motion abnormalities.  2. The right ventricle has normal systolic function. The cavity was normal. There is no increase in right ventricular wall thickness.  3. The mitral valve is degenerative. Mild thickening of the mitral valve leaflet. Moderate calcification of the mitral valve leaflet. There is mild mitral annular calcification present.  4. The tricuspid valve is grossly normal.  5. The aortic valve was not well visualized Moderate calcification of the aortic valve.  6. The interatrial septum was not well visualized.    ASSESSMENT: Ana [name] is a 85 y.o. year old female here with left paramedian pontine infarct on 03/05/2019 secondary to small vessel disease. Vascular risk factors include dementia,  HTN and HLD.  She has been stable from a stroke standpoint with residual deficits of right hemiparesis and dysarthria.    PLAN:  1. Left paramedian infarct: Continue clopidogrel 75 mg daily  and atorvastatin for secondary stroke prevention. Maintain strict control of hypertension with blood pressure goal below 130/90, diabetes with hemoglobin A1c goal below 6.5% and cholesterol with LDL cholesterol (bad  cholesterol) goal below 70 mg/dL.  I also advised the patient to eat a healthy diet with plenty of whole grains, cereals, fruits and vegetables, exercise regularly with at least 30 minutes of continuous activity daily and maintain ideal body weight.  Recommend ongoing participation with therapies including PT/OT/ST for ongoing improvement 2. HTN: Advised to continue current treatment regimen.  Advised to continue to monitor at home along with continued follow-up with PCP for management 3. HLD: Advised to continue current treatment regimen along with continued follow-up with PCP for future prescribing and monitoring of lipid panel 4. Dementia: Managed and monitored by PCP -currently on Namenda and Aricept     Follow up in 3 months or call earlier if needed   Greater than 50% of time during this 26 minute non-face-to-face telephone visit was spent on counseling, explanation of diagnosis of left paramedian infarct, reviewing risk factor management of HTN and HLD, planning of further management along with potential future management, and discussion with patient and family answering all questions.    George HughJessica Eleri Ruben, AGNP-BC  [facility] [address] [place], [place] [id number]  Phone [phone] Fax [phone] Note: This document was prepared with digital dictation and possible smart phrase technology. Any transcriptional errors that result from this process are unintentional.

## 2019-04-17 NOTE — Progress Notes (Signed)
I agree with the above plan 

## 2019-04-18 ENCOUNTER — Encounter: Payer: Self-pay | Admitting: Nurse Practitioner

## 2019-05-15 ENCOUNTER — Encounter: Payer: Self-pay | Admitting: Nurse Practitioner

## 2019-05-15 ENCOUNTER — Non-Acute Institutional Stay (SKILLED_NURSING_FACILITY): Payer: Medicare Other | Admitting: Nurse Practitioner

## 2019-05-15 DIAGNOSIS — K219 Gastro-esophageal reflux disease without esophagitis: Secondary | ICD-10-CM | POA: Diagnosis not present

## 2019-05-15 DIAGNOSIS — R634 Abnormal weight loss: Secondary | ICD-10-CM | POA: Diagnosis not present

## 2019-05-15 DIAGNOSIS — F0390 Unspecified dementia without behavioral disturbance: Secondary | ICD-10-CM | POA: Diagnosis not present

## 2019-05-15 DIAGNOSIS — F419 Anxiety disorder, unspecified: Secondary | ICD-10-CM

## 2019-05-16 ENCOUNTER — Encounter: Payer: Self-pay | Admitting: Nurse Practitioner

## 2019-05-16 DIAGNOSIS — I69322 Dysarthria following cerebral infarction: Secondary | ICD-10-CM | POA: Diagnosis not present

## 2019-05-16 DIAGNOSIS — M6281 Muscle weakness (generalized): Secondary | ICD-10-CM | POA: Diagnosis not present

## 2019-05-16 DIAGNOSIS — R2689 Other abnormalities of gait and mobility: Secondary | ICD-10-CM | POA: Diagnosis not present

## 2019-05-16 DIAGNOSIS — R634 Abnormal weight loss: Secondary | ICD-10-CM | POA: Insufficient documentation

## 2019-05-16 DIAGNOSIS — R2681 Unsteadiness on feet: Secondary | ICD-10-CM | POA: Diagnosis not present

## 2019-05-16 DIAGNOSIS — R278 Other lack of coordination: Secondary | ICD-10-CM | POA: Diagnosis not present

## 2019-05-16 NOTE — Progress Notes (Signed)
Location:   [facility] Room Number: 18/A Place of Service:  SNF (31) Provider: [facility] Ajah Vanhoose NP  [name], MD  Patient Care Team: [name], MD as PCP - General (Internal Medicine) Ngetich, Donalee Citrin, NP as Nurse Practitioner [facility])  Extended Emergency Contact Information Primary Emergency Contact: Gordan,[place] Address: [address]          [place], [place] [address] [place] Home Phone: [phone] Relation: Daughter Secondary Emergency Contact: Iven Finn Address: [address]          [place], [place] [address] [place] Home Phone: [phone] Relation: Spouse  Code Status:  DNR Goals of care: Advanced Directive information Advanced Directives 05/15/2019  Does Patient Have a Medical Advance Directive? Yes  Type of Advance Directive Out of facility DNR (pink MOST or yellow form);Healthcare Power of Attorney  Does patient want to make changes to medical advance directive? No - Patient declined  Copy of Healthcare Power of Attorney in Chart? -  Pre-existing out of facility DNR order (yellow form or pink MOST form) Yellow form placed in chart (order not valid for inpatient use)     Chief Complaint  Patient presents with  . Medical Management of Chronic Issues    Routine visit   . Health Maintenance    Dexa Scan     HPI:  Pt is a 85 y.o. female seen today for medical management of chronic diseases.    The patient resides in SNF [facility] for safety and care assistance, w/c for mobility, her memory is preserved on Memantine  bid, Donepezil  qd. Her mood is stable on Sertraline  qd. GERD stabl eon Pantoprazole  qd. Weight loss about # 3 Ibs in the past month, f/u dietary.    Past Medical History:  Diagnosis Date  . Allergic rhinitis   . Anxiety   . Balance problem 04/28/2015  . Fatigue 04/28/2015  . Hearing loss 04/28/2015  . history of Fracture of right humerus 04/28/2015  . HLD (hyperlipidemia)  03/05/2019  . Hyperglycemia   . Hyperlipidemia   . Hypertension   . Memory loss 04/28/2015   04/26/2014 MMSE 28/30. Failed clock drawing. 04/21/15 MMSE 21/30. Failed clock drawing.   . Osteoporosis    Past Surgical History:  Procedure Laterality Date  . ABDOMINAL HYSTERECTOMY    . CATARACT EXTRACTION  2010  . ELBOW SURGERY Right 1988  . FEMUR FRACTURE SURGERY Right 06/2014   [place], [place] Dr. Dione Housekeeper  . HUMERUS FRACTURE SURGERY Right 2015   [place], [place] Dr. Eber Hong  . TONSILLECTOMY  1935    Allergies  Allergen Reactions  . Ace Inhibitors Other (See Comments)    Unknown reaction  . Almond Oil Rash  . Plum Pulp Rash    Allergies as of 05/15/2019      Reactions   Ace Inhibitors Other (See Comments)   Unknown reaction   Almond Oil Rash   Plum Pulp Rash      Medication List       Accurate as of May 15, 2019 11:59 PM. If you have any questions, ask your nurse or doctor.        atorvastatin 40 MG tablet Commonly known as:  LIPITOR Take 1 tablet (40 mg total) by mouth daily at 6 PM.   calcium carbonate 1500 (600 Ca) MG Tabs tablet Commonly known as:  OSCAL Take 600 mg of elemental calcium by mouth daily with breakfast.   clopidogrel  75 MG tablet Commonly known as:  PLAVIX Take 1 tablet (75 mg total) by mouth daily.   donepezil 10 MG tablet Commonly known as:  ARICEPT TAKE ONE TABLET BY MOUTH DAILY TO PRESERVE MEMORY   Fish Oil 500 MG Caps Take 1 capsule by mouth daily.   memantine 10 MG tablet Commonly known as:  Namenda Take 1 tablet (10 mg total) by mouth 2 (two) times daily.   pantoprazole 40 MG tablet Commonly known as:  Protonix Take 1 tablet (40 mg total) by mouth daily.   sertraline 25 MG tablet Commonly known as:  ZOLOFT Take 25 mg by mouth daily.   Vitamin D3 25 MCG (1000 UT) Caps Take 1,000 Units daily by mouth.   zinc oxide 20 % ointment Apply 1 application topically as needed for irritation. Apply to peri/buttocks after every  incontinent episode      ROS was provided with assistance of staff Review of Systems  Constitutional: Negative for activity change, appetite change, chills, diaphoresis, fatigue and unexpected weight change.  HENT: Positive for hearing loss. Negative for congestion and voice change.   Eyes: Negative for visual disturbance.  Respiratory: Negative for cough, shortness of breath and wheezing.   Cardiovascular: Positive for leg swelling. Negative for chest pain and palpitations.  Gastrointestinal: Negative for abdominal distention, abdominal pain, blood in stool, constipation, diarrhea, nausea and vomiting.  Genitourinary: Negative for difficulty urinating, dysuria and urgency.  Musculoskeletal: Positive for gait problem.  Skin: Negative for color change and pallor.  Neurological: Negative for speech difficulty, weakness and headaches.       Dementia  Psychiatric/Behavioral: Negative for agitation, behavioral problems, hallucinations and sleep disturbance. The patient is not nervous/anxious.     Immunization History  Administered Date(s) Administered  . DTaP 05/12/2014  . Influenza, High Dose Seasonal PF 09/21/2017  . Influenza,inj,Quad PF,6+ Mos 09/14/2018  . Influenza-Unspecified 10/07/2014, 09/11/2015, 09/23/2016  . PPD Test 10/16/2014  . Pneumococcal Conjugate-13 10/20/2017  . Pneumococcal Polysaccharide-23 08/20/2011   Pertinent  Health Maintenance Due  Topic Date Due  . DEXA SCAN  03/21/1993  . INFLUENZA VACCINE  07/14/2019  . PNA vac Low Risk Adult  Completed   Fall Risk  11/21/2018 10/14/2017 02/17/2016 01/06/2015  Falls in the past year? 0 No No Yes  Comment - - - fx (R) humerus; fx (R) femur  Number falls in past yr: 0 - - 2 or more  Injury with Fall? 0 - - -  Risk for fall due to : - - - History of fall(s)   Functional Status Survey:    Vitals:   05/15/19 0913  BP: (!) 147/78  Pulse: 68  Resp: 20  Temp: (!) 97.2 F (36.2 C)  SpO2: 96%  Weight: 123 lb 3.2 oz  (55.9 kg)  Height: 5\' 2"  (1.575 m)   Body mass index is 22.53 kg/m. Physical Exam Vitals signs and nursing note reviewed.  Constitutional:      Appearance: Normal appearance.  HENT:     Head: Normocephalic and atraumatic.     Nose: Nose normal.     Mouth/Throat:     Mouth: Mucous membranes are moist.  Eyes:     Extraocular Movements: Extraocular movements intact.     Conjunctiva/sclera: Conjunctivae normal.     Pupils: Pupils are equal, round, and reactive to light.  Neck:     Musculoskeletal: Normal range of motion and neck supple.  Cardiovascular:     Rate and Rhythm: Normal rate and regular rhythm.  Heart sounds: No murmur.  Pulmonary:     Effort: Pulmonary effort is normal.     Breath sounds: No wheezing or rhonchi.  Abdominal:     General: Bowel sounds are normal. There is no distension.     Palpations: Abdomen is soft.     Tenderness: There is no abdominal tenderness. There is no right CVA tenderness, left CVA tenderness, guarding or rebound.  Musculoskeletal:     Right lower leg: Edema present.     Left lower leg: Edema present.     Comments: Trace edema BLE  Skin:    General: Skin is warm and dry.  Neurological:     General: No focal deficit present.     Mental Status: She is alert. Mental status is at baseline.     Cranial Nerves: No cranial nerve deficit.     Motor: No weakness.     Coordination: Coordination normal.     Gait: Gait abnormal.     Comments: Oriented to self.   Psychiatric:        Mood and Affect: Mood normal.        Behavior: Behavior normal.     Labs reviewed: Recent Labs    03/05/19 1156 03/06/19 0542 03/07/19 0457  NA 144 142 141  K 3.8 3.9 3.8  CL 109 107 108  CO2 26 26 25   GLUCOSE 96 98 105*  BUN 17 17 20   CREATININE 0.92 1.03* 0.91  CALCIUM 9.4 9.4 9.3   Recent Labs    03/05/19 1156 03/06/19 0542 03/07/19 0457  AST 20 18 19   ALT 14 13 12   ALKPHOS 49 43 47  BILITOT 0.6 1.0 0.6  PROT 6.2* 5.7* 5.7*  ALBUMIN 3.6  3.2* 3.2*   Recent Labs    07/17/18 0815 03/05/19 1156 03/07/19 0457  WBC 9.8 9.2 8.9  NEUTROABS  --  5.2  --   HGB 13.6 13.0 12.5  HCT 41.2 40.0 39.0  MCV 94.9 99.5 96.8  PLT 223 209 193   Lab Results  Component Value Date   TSH 1.95 11/13/2018   Lab Results  Component Value Date   HGBA1C 5.9 (H) 03/06/2019   Lab Results  Component Value Date   CHOL 234 (H) 03/06/2019   HDL 37 (L) 03/06/2019   LDLCALC 175 (H) 03/06/2019   TRIG 110 03/06/2019   CHOLHDL 6.3 03/06/2019    Significant Diagnostic Results in last 30 days:  No results found.  Assessment/Plan GERD (gastroesophageal reflux disease) Stable, continue Pantoprazole 40mg  qd.   Dementia (HCC) Continue SNF [facility] for safety and care assistance, continue Memantine 10mg  bid, Donepezil 10mg  qd, w/c for mobility.   Anxiety Her mood is stable, continue Sertraline 25mg  qd.   Weight loss About #3Ibs in the past month, she denied nausea, vomiting, abd pain, indigestion, constipation or diarrhea, dietary to follow, observe.    Family/ staff Communication: plan of care reviewed with the patient and charge nurse.   Labs/tests ordered: none  Time spend 25 minutes.

## 2019-05-16 NOTE — Assessment & Plan Note (Signed)
Continue SNF [facility] for safety and care assistance, continue Memantine 10mg  bid, Donepezil 10mg  qd, w/c for mobility.

## 2019-05-16 NOTE — Assessment & Plan Note (Signed)
Her mood is stable, continue Sertraline 25mg qd.  

## 2019-05-16 NOTE — Assessment & Plan Note (Signed)
Stable, continue Pantoprazole 40mg qd.  

## 2019-05-16 NOTE — Assessment & Plan Note (Signed)
About #3Ibs in the past month, she denied nausea, vomiting, abd pain, indigestion, constipation or diarrhea, dietary to follow, observe.

## 2019-05-17 ENCOUNTER — Other Ambulatory Visit: Payer: Self-pay

## 2019-05-17 DIAGNOSIS — R2689 Other abnormalities of gait and mobility: Secondary | ICD-10-CM | POA: Diagnosis not present

## 2019-05-17 DIAGNOSIS — M6281 Muscle weakness (generalized): Secondary | ICD-10-CM | POA: Diagnosis not present

## 2019-05-17 DIAGNOSIS — R2681 Unsteadiness on feet: Secondary | ICD-10-CM | POA: Diagnosis not present

## 2019-05-17 DIAGNOSIS — R278 Other lack of coordination: Secondary | ICD-10-CM | POA: Diagnosis not present

## 2019-05-17 DIAGNOSIS — I69322 Dysarthria following cerebral infarction: Secondary | ICD-10-CM | POA: Diagnosis not present

## 2019-05-18 DIAGNOSIS — I69322 Dysarthria following cerebral infarction: Secondary | ICD-10-CM | POA: Diagnosis not present

## 2019-05-18 DIAGNOSIS — R2689 Other abnormalities of gait and mobility: Secondary | ICD-10-CM | POA: Diagnosis not present

## 2019-05-18 DIAGNOSIS — R278 Other lack of coordination: Secondary | ICD-10-CM | POA: Diagnosis not present

## 2019-05-18 DIAGNOSIS — R2681 Unsteadiness on feet: Secondary | ICD-10-CM | POA: Diagnosis not present

## 2019-05-18 DIAGNOSIS — M6281 Muscle weakness (generalized): Secondary | ICD-10-CM | POA: Diagnosis not present

## 2019-05-21 DIAGNOSIS — R2689 Other abnormalities of gait and mobility: Secondary | ICD-10-CM | POA: Diagnosis not present

## 2019-05-21 DIAGNOSIS — R2681 Unsteadiness on feet: Secondary | ICD-10-CM | POA: Diagnosis not present

## 2019-05-21 DIAGNOSIS — R278 Other lack of coordination: Secondary | ICD-10-CM | POA: Diagnosis not present

## 2019-05-21 DIAGNOSIS — I69322 Dysarthria following cerebral infarction: Secondary | ICD-10-CM | POA: Diagnosis not present

## 2019-05-21 DIAGNOSIS — M6281 Muscle weakness (generalized): Secondary | ICD-10-CM | POA: Diagnosis not present

## 2019-05-23 DIAGNOSIS — R2681 Unsteadiness on feet: Secondary | ICD-10-CM | POA: Diagnosis not present

## 2019-05-23 DIAGNOSIS — M6281 Muscle weakness (generalized): Secondary | ICD-10-CM | POA: Diagnosis not present

## 2019-05-23 DIAGNOSIS — R2689 Other abnormalities of gait and mobility: Secondary | ICD-10-CM | POA: Diagnosis not present

## 2019-05-23 DIAGNOSIS — I69322 Dysarthria following cerebral infarction: Secondary | ICD-10-CM | POA: Diagnosis not present

## 2019-05-23 DIAGNOSIS — R278 Other lack of coordination: Secondary | ICD-10-CM | POA: Diagnosis not present

## 2019-05-24 ENCOUNTER — Other Ambulatory Visit: Payer: Self-pay

## 2019-05-24 ENCOUNTER — Other Ambulatory Visit: Payer: Medicare Other

## 2019-05-24 DIAGNOSIS — N183 Chronic kidney disease, stage 3 unspecified: Secondary | ICD-10-CM

## 2019-05-24 DIAGNOSIS — R2681 Unsteadiness on feet: Secondary | ICD-10-CM | POA: Diagnosis not present

## 2019-05-24 DIAGNOSIS — M6281 Muscle weakness (generalized): Secondary | ICD-10-CM | POA: Diagnosis not present

## 2019-05-24 DIAGNOSIS — R2689 Other abnormalities of gait and mobility: Secondary | ICD-10-CM | POA: Diagnosis not present

## 2019-05-24 DIAGNOSIS — R278 Other lack of coordination: Secondary | ICD-10-CM | POA: Diagnosis not present

## 2019-05-24 DIAGNOSIS — I69322 Dysarthria following cerebral infarction: Secondary | ICD-10-CM | POA: Diagnosis not present

## 2019-05-28 DIAGNOSIS — R278 Other lack of coordination: Secondary | ICD-10-CM | POA: Diagnosis not present

## 2019-05-28 DIAGNOSIS — R2681 Unsteadiness on feet: Secondary | ICD-10-CM | POA: Diagnosis not present

## 2019-05-28 DIAGNOSIS — R2689 Other abnormalities of gait and mobility: Secondary | ICD-10-CM | POA: Diagnosis not present

## 2019-05-28 DIAGNOSIS — I69322 Dysarthria following cerebral infarction: Secondary | ICD-10-CM | POA: Diagnosis not present

## 2019-05-28 DIAGNOSIS — M6281 Muscle weakness (generalized): Secondary | ICD-10-CM | POA: Diagnosis not present

## 2019-05-29 ENCOUNTER — Encounter: Payer: Self-pay | Admitting: Family

## 2019-05-30 ENCOUNTER — Encounter: Payer: Medicare Other | Admitting: Internal Medicine

## 2019-05-30 DIAGNOSIS — R2689 Other abnormalities of gait and mobility: Secondary | ICD-10-CM | POA: Diagnosis not present

## 2019-05-30 DIAGNOSIS — I69322 Dysarthria following cerebral infarction: Secondary | ICD-10-CM | POA: Diagnosis not present

## 2019-05-30 DIAGNOSIS — R278 Other lack of coordination: Secondary | ICD-10-CM | POA: Diagnosis not present

## 2019-05-30 DIAGNOSIS — R2681 Unsteadiness on feet: Secondary | ICD-10-CM | POA: Diagnosis not present

## 2019-05-30 DIAGNOSIS — M6281 Muscle weakness (generalized): Secondary | ICD-10-CM | POA: Diagnosis not present

## 2019-05-31 DIAGNOSIS — I69322 Dysarthria following cerebral infarction: Secondary | ICD-10-CM | POA: Diagnosis not present

## 2019-05-31 DIAGNOSIS — R278 Other lack of coordination: Secondary | ICD-10-CM | POA: Diagnosis not present

## 2019-05-31 DIAGNOSIS — R2681 Unsteadiness on feet: Secondary | ICD-10-CM | POA: Diagnosis not present

## 2019-05-31 DIAGNOSIS — R2689 Other abnormalities of gait and mobility: Secondary | ICD-10-CM | POA: Diagnosis not present

## 2019-05-31 DIAGNOSIS — M6281 Muscle weakness (generalized): Secondary | ICD-10-CM | POA: Diagnosis not present

## 2019-06-01 DIAGNOSIS — I69322 Dysarthria following cerebral infarction: Secondary | ICD-10-CM | POA: Diagnosis not present

## 2019-06-01 DIAGNOSIS — R2689 Other abnormalities of gait and mobility: Secondary | ICD-10-CM | POA: Diagnosis not present

## 2019-06-01 DIAGNOSIS — R278 Other lack of coordination: Secondary | ICD-10-CM | POA: Diagnosis not present

## 2019-06-01 DIAGNOSIS — R2681 Unsteadiness on feet: Secondary | ICD-10-CM | POA: Diagnosis not present

## 2019-06-01 DIAGNOSIS — M6281 Muscle weakness (generalized): Secondary | ICD-10-CM | POA: Diagnosis not present

## 2019-06-05 DIAGNOSIS — M6281 Muscle weakness (generalized): Secondary | ICD-10-CM | POA: Diagnosis not present

## 2019-06-05 DIAGNOSIS — R278 Other lack of coordination: Secondary | ICD-10-CM | POA: Diagnosis not present

## 2019-06-05 DIAGNOSIS — R2689 Other abnormalities of gait and mobility: Secondary | ICD-10-CM | POA: Diagnosis not present

## 2019-06-05 DIAGNOSIS — R2681 Unsteadiness on feet: Secondary | ICD-10-CM | POA: Diagnosis not present

## 2019-06-05 DIAGNOSIS — I69322 Dysarthria following cerebral infarction: Secondary | ICD-10-CM | POA: Diagnosis not present

## 2019-06-07 DIAGNOSIS — R278 Other lack of coordination: Secondary | ICD-10-CM | POA: Diagnosis not present

## 2019-06-07 DIAGNOSIS — I69322 Dysarthria following cerebral infarction: Secondary | ICD-10-CM | POA: Diagnosis not present

## 2019-06-07 DIAGNOSIS — R2681 Unsteadiness on feet: Secondary | ICD-10-CM | POA: Diagnosis not present

## 2019-06-07 DIAGNOSIS — R2689 Other abnormalities of gait and mobility: Secondary | ICD-10-CM | POA: Diagnosis not present

## 2019-06-07 DIAGNOSIS — M6281 Muscle weakness (generalized): Secondary | ICD-10-CM | POA: Diagnosis not present

## 2019-06-08 DIAGNOSIS — M6281 Muscle weakness (generalized): Secondary | ICD-10-CM | POA: Diagnosis not present

## 2019-06-08 DIAGNOSIS — R278 Other lack of coordination: Secondary | ICD-10-CM | POA: Diagnosis not present

## 2019-06-08 DIAGNOSIS — I69322 Dysarthria following cerebral infarction: Secondary | ICD-10-CM | POA: Diagnosis not present

## 2019-06-08 DIAGNOSIS — R2689 Other abnormalities of gait and mobility: Secondary | ICD-10-CM | POA: Diagnosis not present

## 2019-06-08 DIAGNOSIS — R2681 Unsteadiness on feet: Secondary | ICD-10-CM | POA: Diagnosis not present

## 2019-06-09 DIAGNOSIS — Z03818 Encounter for observation for suspected exposure to other biological agents ruled out: Secondary | ICD-10-CM | POA: Diagnosis not present

## 2019-06-11 LAB — NOVEL CORONAVIRUS, NAA: SARS-CoV-2, NAA: NOT DETECTED

## 2019-06-12 DIAGNOSIS — R278 Other lack of coordination: Secondary | ICD-10-CM | POA: Diagnosis not present

## 2019-06-12 DIAGNOSIS — R2681 Unsteadiness on feet: Secondary | ICD-10-CM | POA: Diagnosis not present

## 2019-06-12 DIAGNOSIS — R2689 Other abnormalities of gait and mobility: Secondary | ICD-10-CM | POA: Diagnosis not present

## 2019-06-12 DIAGNOSIS — M6281 Muscle weakness (generalized): Secondary | ICD-10-CM | POA: Diagnosis not present

## 2019-06-12 DIAGNOSIS — I69322 Dysarthria following cerebral infarction: Secondary | ICD-10-CM | POA: Diagnosis not present

## 2019-06-13 ENCOUNTER — Encounter: Payer: Medicare Other | Admitting: Internal Medicine

## 2019-06-13 ENCOUNTER — Other Ambulatory Visit: Payer: Self-pay

## 2019-06-14 DIAGNOSIS — R278 Other lack of coordination: Secondary | ICD-10-CM | POA: Diagnosis not present

## 2019-06-14 DIAGNOSIS — R2689 Other abnormalities of gait and mobility: Secondary | ICD-10-CM | POA: Diagnosis not present

## 2019-06-14 DIAGNOSIS — I69322 Dysarthria following cerebral infarction: Secondary | ICD-10-CM | POA: Diagnosis not present

## 2019-06-14 DIAGNOSIS — Z8781 Personal history of (healed) traumatic fracture: Secondary | ICD-10-CM | POA: Diagnosis not present

## 2019-06-14 DIAGNOSIS — M6281 Muscle weakness (generalized): Secondary | ICD-10-CM | POA: Diagnosis not present

## 2019-06-14 DIAGNOSIS — M810 Age-related osteoporosis without current pathological fracture: Secondary | ICD-10-CM | POA: Diagnosis not present

## 2019-06-15 DIAGNOSIS — R278 Other lack of coordination: Secondary | ICD-10-CM | POA: Diagnosis not present

## 2019-06-15 DIAGNOSIS — I69322 Dysarthria following cerebral infarction: Secondary | ICD-10-CM | POA: Diagnosis not present

## 2019-06-15 DIAGNOSIS — Z8781 Personal history of (healed) traumatic fracture: Secondary | ICD-10-CM | POA: Diagnosis not present

## 2019-06-15 DIAGNOSIS — M810 Age-related osteoporosis without current pathological fracture: Secondary | ICD-10-CM | POA: Diagnosis not present

## 2019-06-15 DIAGNOSIS — R2689 Other abnormalities of gait and mobility: Secondary | ICD-10-CM | POA: Diagnosis not present

## 2019-06-15 DIAGNOSIS — M6281 Muscle weakness (generalized): Secondary | ICD-10-CM | POA: Diagnosis not present

## 2019-06-25 ENCOUNTER — Encounter: Payer: Self-pay | Admitting: Internal Medicine

## 2019-06-25 ENCOUNTER — Non-Acute Institutional Stay (SKILLED_NURSING_FACILITY): Payer: Medicare Other | Admitting: Internal Medicine

## 2019-06-25 DIAGNOSIS — I10 Essential (primary) hypertension: Secondary | ICD-10-CM | POA: Diagnosis not present

## 2019-06-25 DIAGNOSIS — E7849 Other hyperlipidemia: Secondary | ICD-10-CM | POA: Diagnosis not present

## 2019-06-25 DIAGNOSIS — F0390 Unspecified dementia without behavioral disturbance: Secondary | ICD-10-CM

## 2019-06-25 DIAGNOSIS — I6302 Cerebral infarction due to thrombosis of basilar artery: Secondary | ICD-10-CM

## 2019-06-25 NOTE — Progress Notes (Signed)
Location:  [facility] Room Number: 18/A Place of Service:  SNF (31)  Provider:Alda Gaultney Geralynn RileAnjali L,MD    Code Status: DNR Goals of Care:  Advanced Directives 06/25/2019  Does Patient Have a Medical Advance Directive? Yes  Type of Advance Directive Living will;Out of facility DNR (pink MOST or yellow form);Healthcare Power of Attorney  Does patient want to make changes to medical advance directive? No - Patient declined  Copy of Healthcare Power of Attorney in Chart? Yes - validated most recent copy scanned in chart (See row information)  Pre-existing out of facility DNR order (yellow form or pink MOST form) Yellow form placed in chart (order not valid for inpatient use)     Chief Complaint  Patient presents with  . Medical Management of Chronic Issues    Routine visit   . Health Maintenance    Dexa Scan     HPI: Patient is a 85 y.o. female seen today for medical management of chronic diseases.    Patient has history of dementia, anxiety, history of right humerus fracture, hypertension and hyperlipidemia. Patient was admitted in the hospital from 03/23-03/25 for Acute CVA With weakness in her Right Leg and Arm Patient was treated with 3 weeks of dual antiplatelet therapy and then was continued on Plavix.  She also is on statin for her hyperlipidemia.  She was started on Zoloft for her mood. Patient has done well since then. Her weight is stable. No new nursing issues. Patient was little upset today as she said she gets too many medications.  She says she does not understand why she needs to stay in SNF.  Per nurses patient is able to transfer herself and plan is to eventually go to [place] with her husband.   Past Medical History:  Diagnosis Date  . Allergic rhinitis   . Anxiety   . Balance problem 04/28/2015  . Fatigue 04/28/2015  . Hearing loss 04/28/2015  . history of Fracture of right humerus 04/28/2015  . HLD (hyperlipidemia) 03/05/2019  . Hyperglycemia   .  Hyperlipidemia   . Hypertension   . Memory loss 04/28/2015   04/26/2014 MMSE 28/30. Failed clock drawing. 04/21/15 MMSE 21/30. Failed clock drawing.   . Osteoporosis     Past Surgical History:  Procedure Laterality Date  . ABDOMINAL HYSTERECTOMY    . CATARACT EXTRACTION  2010  . ELBOW SURGERY Right 1988  . FEMUR FRACTURE SURGERY Right 06/2014   [place], [place] Dr. Dione HousekeeperJohn [place]ho[place]  . HUMERUS FRACTURE SURGERY Right 2015   [place], [place] Dr. Eber HongBrian Miller  . TONSILLECTOMY  1935    Allergies  Allergen Reactions  . Ace Inhibitors Other (See Comments)    Unknown reaction  . Almond Oil Rash  . Plum Pulp Rash    Outpatient Encounter Medications as of 06/25/2019  Medication Sig  . atorvastatin (LIPITOR) 40 MG tablet Take 1 tablet (40 mg total) by mouth daily at 6 PM.  . calcium carbonate (OSCAL) 1500 (600 Ca) MG TABS tablet Take 600 mg of elemental calcium by mouth daily with breakfast.  . Cholecalciferol (VITAMIN D3) 1000 UNITS CAPS Take 1,000 Units daily by mouth.   . clopidogrel (PLAVIX) 75 MG tablet Take 1 tablet (75 mg total) by mouth daily.  Marland Kitchen. donepezil (ARICEPT) 10 MG tablet TAKE ONE TABLET BY MOUTH DAILY TO PRESERVE MEMORY  . memantine (NAMENDA) 10 MG tablet Take 1 tablet (10 mg total) by mouth 2 (two) times daily.  . Omega-3 Fatty Acids (FISH  OIL) 500 MG CAPS Take 1 capsule by mouth daily.  . pantoprazole (PROTONIX) 40 MG tablet Take 1 tablet (40 mg total) by mouth daily.  . sertraline (ZOLOFT) 25 MG tablet Take 25 mg by mouth daily.  Marland Kitchen zinc oxide 20 % ointment Apply 1 application topically as needed for irritation. Apply to peri/buttocks after every incontinent episode   No facility-administered encounter medications on file as of 06/25/2019.     Review of Systems:  Review of Systems  Review of Systems  Constitutional: Negative for activity change, appetite change, chills, diaphoresis, fatigue and fever.  HENT: Negative for mouth sores, postnasal drip, rhinorrhea, sinus pain and  sore throat.   Respiratory: Negative for apnea, cough, chest tightness, shortness of breath and wheezing.   Cardiovascular: Negative for chest pain, palpitations and leg swelling.  Gastrointestinal: Negative for abdominal distention, abdominal pain, constipation, diarrhea, nausea and vomiting.  Genitourinary: Negative for dysuria and frequency.  Musculoskeletal: Negative for arthralgias, joint swelling and myalgias.  Skin: Negative for rash.  Neurological: Negative for dizziness, syncope, weakness, light-headedness and numbness.  Psychiatric/Behavioral: Negative for behavioral problems,and sleep disturbance.     Health Maintenance  Topic Date Due  . DEXA SCAN  03/21/1993  . INFLUENZA VACCINE  07/14/2019  . TETANUS/TDAP  12/14/2023  . PNA vac Low Risk Adult  Completed    Physical Exam: Vitals:   06/25/19 1416  BP: 128/68  Pulse: 73  Resp: 18  Temp: 97.6 F (36.4 C)  SpO2: 96%  Weight: 125 lb 14.4 oz (57.1 kg)  Height: 5\' 2"  (1.575 m)   Body mass index is 23.03 kg/m. Physical Exam  Constitutional:  Well-developed and well-nourished.  HENT:  Head: Normocephalic.  Mouth/Throat: Oropharynx is clear and moist.  Eyes: Pupils are equal, round, and reactive to light.  Neck: Neck supple.  Cardiovascular: Normal rate and normal heart sounds.  No murmur heard. Pulmonary/Chest: Effort normal and breath sounds normal. No respiratory distress. No wheezes. She has no rales.  Abdominal: Soft. Bowel sounds are normal. No distension. There is no tenderness. There is no rebound.  Musculoskeletal: No edema.  Lymphadenopathy: none Neurological: NO focal Deficits Speech was Normal Skin: Skin is warm and dry.  Psychiatric: Normal mood and affect. Behavior is normal. Thought content normal.    Labs reviewed: Basic Metabolic Panel: Recent Labs    11/13/18 0800 03/05/19 1156 03/06/19 0542 03/07/19 0457  NA 146 144 142 141  K 4.0 3.8 3.9 3.8  CL 110 109 107 108  CO2 25 26 26 25    GLUCOSE 108* 96 98 105*  BUN 17 17 17 20   CREATININE 1.01* 0.92 1.03* 0.91  CALCIUM 9.2 9.4 9.4 9.3  TSH 1.95  --   --   --    Liver Function Tests: Recent Labs    03/05/19 1156 03/06/19 0542 03/07/19 0457  AST 20 18 19   ALT 14 13 12   ALKPHOS 49 43 47  BILITOT 0.6 1.0 0.6  PROT 6.2* 5.7* 5.7*  ALBUMIN 3.6 3.2* 3.2*   No results for input(s): LIPASE, AMYLASE in the last 8760 hours. No results for input(s): AMMONIA in the last 8760 hours. CBC: Recent Labs    07/17/18 0815 03/05/19 1156 03/07/19 0457  WBC 9.8 9.2 8.9  NEUTROABS  --  5.2  --   HGB 13.6 13.0 12.5  HCT 41.2 40.0 39.0  MCV 94.9 99.5 96.8  PLT 223 209 193   Lipid Panel: Recent Labs    07/17/18 0815 03/06/19 0542  CHOL 260* 234*  HDL 52 37*  LDLCALC 179* 175*  TRIG 148 110  CHOLHDL 5.0* 6.3   Lab Results  Component Value Date   HGBA1C 5.9 (H) 03/06/2019    Procedures since last visit: No results found.  Assessment/Plan  Acute CVA Work up including Echo and Carotids was negative On Statin now BP controlled Repeat Lipid Profile Continue on Plavix Hypertension Not on Any meds. BP controlled Hyperlipidemia  On Statin Repeat Lipid Profile Goal LDL less then 70 Dementia Continue Aricept and Namenda Depression Continue on Zoloft Will consider GDR later.as patient saying today that she is not depressed  Labs/tests ordered:  CMP, CBC and Lipid Profile  Total time spent in this patient care encounter was  25_  minutes; greater than 50% of the visit spent counseling patient and staff, reviewing records , Labs and coordinating care for problems addressed at this encounter.

## 2019-06-28 DIAGNOSIS — E785 Hyperlipidemia, unspecified: Secondary | ICD-10-CM | POA: Diagnosis not present

## 2019-06-28 DIAGNOSIS — R7989 Other specified abnormal findings of blood chemistry: Secondary | ICD-10-CM | POA: Diagnosis not present

## 2019-06-28 DIAGNOSIS — R6889 Other general symptoms and signs: Secondary | ICD-10-CM | POA: Diagnosis not present

## 2019-06-28 LAB — LIPID PANEL
Cholesterol: 119 (ref 0–200)
HDL: 38 (ref 35–70)
LDL Cholesterol: 65
LDl/HDL Ratio: 3.1
Triglycerides: 78 (ref 40–160)

## 2019-06-28 LAB — BASIC METABOLIC PANEL
BUN: 24 — AB (ref 4–21)
Creatinine: 0.8 (ref 0.5–1.1)
Glucose: 88
Potassium: 4.2 (ref 3.4–5.3)
Sodium: 143 (ref 137–147)

## 2019-06-28 LAB — CBC AND DIFFERENTIAL
HCT: 35 — AB (ref 36–46)
Hemoglobin: 11.4 — AB (ref 12.0–16.0)
Platelets: 202 (ref 150–399)
WBC: 7.7

## 2019-06-28 LAB — HEPATIC FUNCTION PANEL
ALT: 15 (ref 7–35)
AST: 11 — AB (ref 13–35)
Alkaline Phosphatase: 60 (ref 25–125)
Bilirubin, Total: 0.4

## 2019-07-17 ENCOUNTER — Non-Acute Institutional Stay (SKILLED_NURSING_FACILITY): Payer: Medicare Other | Admitting: Nurse Practitioner

## 2019-07-17 ENCOUNTER — Encounter: Payer: Self-pay | Admitting: Nurse Practitioner

## 2019-07-17 ENCOUNTER — Other Ambulatory Visit: Payer: Self-pay | Admitting: *Deleted

## 2019-07-17 DIAGNOSIS — K219 Gastro-esophageal reflux disease without esophagitis: Secondary | ICD-10-CM | POA: Diagnosis not present

## 2019-07-17 DIAGNOSIS — F419 Anxiety disorder, unspecified: Secondary | ICD-10-CM

## 2019-07-17 DIAGNOSIS — F0390 Unspecified dementia without behavioral disturbance: Secondary | ICD-10-CM | POA: Diagnosis not present

## 2019-07-17 LAB — CHLORIDE
Albumin: 3.5
Calcium: 8.9
Carbon Dioxide, Total: 26
Chloride: 108
EGFR (Non-African Amer.): 68
Globulin: 1.9
Total Protein: 5.4 g/dL

## 2019-07-17 NOTE — Assessment & Plan Note (Signed)
Her mood is stable, continue Sertraline 25mg qd.  

## 2019-07-17 NOTE — Progress Notes (Signed)
Location:  [facility] Room Number: 18 Place of Service:  SNF (31) Provider:  Chipper OmanMast, ManXie  NP  Mahlon GammonGupta, Anjali L, MD  Patient Care Team: Mahlon GammonGupta, Anjali L, MD as PCP - General (Internal Medicine) Ngetich, Donalee Citrininah C, NP as Nurse Practitioner [facility](Family Medicine)  Extended Emergency Contact Information Primary Emergency Contact: Gordan,[place] Address: [address]          [place], [place] [address] [place] Home Phone: [phone] Relation: Daughter Secondary Emergency Contact: Iven FinnGarrigan,Donald Address: [address]          [place], [place] [address] [place] Home Phone: [phone] Relation: Spouse  Code Status:  DNR Goals of care: Advanced Directive information Advanced Directives 06/25/2019  Does Patient Have a Medical Advance Directive? Yes  Type of Advance Directive Living will;Out of facility DNR (pink MOST or yellow form);Healthcare Power of Attorney  Does patient want to make changes to medical advance directive? No - Patient declined  Copy of Healthcare Power of Attorney in Chart? Yes - validated most recent copy scanned in chart (See row information)  Pre-existing out of facility DNR order (yellow form or pink MOST form) Yellow form placed in chart (order not valid for inpatient use)     Chief Complaint  Patient presents with  . Medical Management of Chronic Issues    HPI:  Pt is a 85 y.o. female seen today for medical management of chronic diseases.    The [facility] for [facility] and care assistance, w/c for mobility, taking Donepezil 10mg  qd, Memantine 10mg  bid for memory. Her mood is stable, taking Sertraline 25mg  qd. Hx of GERD, stable, on Pantoprazole 40mg  qd.    Past Medical History:  Diagnosis Date  . Allergic rhinitis   . Anxiety   . Balance problem 04/28/2015  . Fatigue 04/28/2015  . Hearing loss 04/28/2015  . history of Fracture of right humerus 04/28/2015  . HLD (hyperlipidemia) 03/05/2019  .  Hyperglycemia   . Hyperlipidemia   . Hypertension   . Memory loss 04/28/2015   04/26/2014 MMSE 28/30. Failed clock drawing. 04/21/15 MMSE 21/30. Failed clock drawing.   . Osteoporosis    Past Surgical History:  Procedure Laterality Date  . ABDOMINAL HYSTERECTOMY    . CATARACT EXTRACTION  2010  . ELBOW SURGERY Right 1988  . FEMUR FRACTURE SURGERY Right 06/2014   [place] Dr. Dione HousekeeperJohn Vanderhoof  . HUMERUS FRACTURE SURGERY Right 2015   [place] Dr. Eber HongBrian Miller  . TONSILLECTOMY  1935    Allergies  Allergen Reactions  . Ace Inhibitors Other (See Comments)    Unknown reaction  . Almond Oil Rash  . Plum Pulp Rash    Outpatient Encounter Medications as of 07/17/2019  Medication Sig  . atorvastatin (LIPITOR) 40 MG tablet Take 1 tablet (40 mg total) by mouth daily at 6 PM.  . calcium carbonate (OSCAL) 1500 (600 Ca) MG TABS tablet Take 600 mg of elemental calcium by mouth daily with breakfast.  . Cholecalciferol (VITAMIN D3) 1000 UNITS CAPS Take 1,000 Units daily by mouth.   . clopidogrel (PLAVIX) 75 MG tablet Take 1 tablet (75 mg total) by mouth daily.  Marland Kitchen. donepezil (ARICEPT) 10 MG tablet TAKE ONE TABLET BY MOUTH DAILY TO PRESERVE MEMORY  . memantine (NAMENDA) 10 MG tablet Take 1 tablet (10 mg total) by mouth 2 (two) times daily.  . Omega-3 Fatty Acids (FISH OIL) 500 MG CAPS Take 1 capsule by mouth daily.  .Marland Kitchen  pantoprazole (PROTONIX) 40 MG tablet Take 1 tablet (40 mg total) by mouth daily.  . sertraline (ZOLOFT) 25 MG tablet Take 25 mg by mouth daily.  Marland Kitchen. zinc oxide 20 % ointment Apply 1 application topically as needed for irritation. Apply to peri/buttocks after every incontinent episode   No facility-administered encounter medications on file as of 07/17/2019.    ROS was provided with assistance of staff Review of Systems  Constitutional: Negative for activity change, appetite change, chills, diaphoresis, fatigue, fever and unexpected weight change.  HENT: Positive for hearing loss.  Negative for congestion and voice change.   Respiratory: Negative for cough, shortness of breath and wheezing.   Gastrointestinal: Negative for abdominal distention, abdominal pain, constipation, diarrhea, nausea and vomiting.  Genitourinary: Negative for difficulty urinating, dysuria and urgency.  Musculoskeletal: Positive for gait problem.  Skin: Negative for color change.  Neurological: Negative for speech difficulty, weakness and headaches.       Dementia  Psychiatric/Behavioral: Negative for agitation, behavioral problems, hallucinations and sleep disturbance. The patient is not nervous/anxious.     Immunization History  Administered Date(s) Administered  . DTaP 05/12/2014  . Influenza, High Dose Seasonal PF 09/21/2017  . Influenza,inj,Quad PF,6+ Mos 09/14/2018  . Influenza-Unspecified 10/07/2014, 09/11/2015, 09/23/2016  . PPD Test 10/16/2014  . Pneumococcal Conjugate-13 10/20/2017  . Pneumococcal Polysaccharide-23 08/20/2011   Pertinent  Health Maintenance Due  Topic Date Due  . DEXA SCAN  03/21/1993  . INFLUENZA VACCINE  07/14/2019  . PNA vac Low Risk Adult  Completed   Fall Risk  11/21/2018 10/14/2017 02/17/2016 01/06/2015  Falls in the past year? 0 No No Yes  Comment - - - fx (R) humerus; fx (R) femur  Number falls in past yr: 0 - - 2 or more  Injury with Fall? 0 - - -  Risk for fall due to : - - - History of fall(s)   Functional Status Survey:    Vitals:   07/17/19 1315  BP: (!) 110/52  Pulse: 74  Resp: 18  Temp: (!) 96.8 F (36 C)  SpO2: 94%  Weight: 126 lb (57.2 kg)  Height: 5\' 2"  (1.575 m)   Body mass index is 23.05 kg/m. Physical Exam Vitals signs and nursing note reviewed.  Constitutional:      General: She is not in acute distress.    Appearance: Normal appearance. She is not ill-appearing, toxic-appearing or diaphoretic.  HENT:     Head: Normocephalic and atraumatic.     Mouth/Throat:     Mouth: Mucous membranes are moist.  Eyes:      Extraocular Movements: Extraocular movements intact.     Conjunctiva/sclera: Conjunctivae normal.     Pupils: Pupils are equal, round, and reactive to light.  Neck:     Musculoskeletal: Normal range of motion and neck supple.  Cardiovascular:     Rate and Rhythm: Normal rate and regular rhythm.  Pulmonary:     Effort: Pulmonary effort is normal.     Breath sounds: No wheezing, rhonchi or rales.  Chest:     Chest wall: No tenderness.  Abdominal:     General: There is no distension.     Palpations: Abdomen is soft.     Tenderness: There is no abdominal tenderness. There is no right CVA tenderness, left CVA tenderness, guarding or rebound.  Musculoskeletal:     Right lower leg: No edema.     Comments: W/c for mobility  Skin:    General: Skin is warm and dry.  Neurological:     General: No focal deficit present.     Mental Status: She is alert. Mental status is at baseline.     Cranial Nerves: No cranial nerve deficit.     Motor: No weakness.     Coordination: Coordination normal.     Gait: Gait abnormal.     Comments: Oriented to self  Psychiatric:        Mood and Affect: Mood normal.     Labs reviewed: Recent Labs    03/05/19 1156 03/06/19 0542 03/07/19 0457 06/28/19  NA 144 142 141 143  K 3.8 3.9 3.8 4.2  CL 109 107 108 108  CO2 26 26 25 26   GLUCOSE 96 98 105*  --   BUN 17 17 20  24*  CREATININE 0.92 1.03* 0.91 0.8  CALCIUM 9.4 9.4 9.3 8.9   Recent Labs    03/05/19 1156 03/06/19 0542 03/07/19 0457 06/28/19  AST 20 18 19  11*  ALT 14 13 12 15   ALKPHOS 49 43 47 60  BILITOT 0.6 1.0 0.6  --   PROT 6.2* 5.7* 5.7* 5.4  ALBUMIN 3.6 3.2* 3.2* 3.5   Recent Labs    03/05/19 1156 03/07/19 0457 06/28/19  WBC 9.2 8.9 7.7  NEUTROABS 5.2  --   --   HGB 13.0 12.5 11.4*  HCT 40.0 39.0 35*  MCV 99.5 96.8  --   PLT 209 193 202   Lab Results  Component Value Date   TSH 1.95 11/13/2018   Lab Results  Component Value Date   HGBA1C 5.9 (H) 03/06/2019   Lab  Results  Component Value Date   CHOL 119 06/28/2019   HDL 38 06/28/2019   LDLCALC 65 06/28/2019   TRIG 78 06/28/2019   CHOLHDL 6.3 03/06/2019    Significant Diagnostic Results in last 30 days:  No results found.  Assessment/Plan GERD (gastroesophageal reflux disease) Stable, continue Pantoprazole 40mg  qd.   Dementia ([place]) Table, scontinue SNF [facility] for safety and care assistance, w/c for mobility, continue Memantine, Donepezil for memory.  Anxiety Her mood is stable, continue Sertraline 25mg  qd     Family/ staff Communication: plan of care reviewed with the patient and charge nurse.   Labs/tests ordered:  none  Time spend 25 minutes.

## 2019-07-17 NOTE — Assessment & Plan Note (Addendum)
Stable, continue Pantoprazole 40mg qd.  

## 2019-07-17 NOTE — Assessment & Plan Note (Signed)
Table, scontinue SNF [facility] for safety and care assistance, w/c for mobility, continue Memantine, Donepezil for memory.

## 2019-08-03 ENCOUNTER — Ambulatory Visit: Payer: Medicare Other | Admitting: Adult Health

## 2019-08-07 ENCOUNTER — Ambulatory Visit (INDEPENDENT_AMBULATORY_CARE_PROVIDER_SITE_OTHER): Payer: Medicare Other | Admitting: Adult Health

## 2019-08-07 ENCOUNTER — Encounter: Payer: Self-pay | Admitting: Adult Health

## 2019-08-07 ENCOUNTER — Other Ambulatory Visit: Payer: Self-pay

## 2019-08-07 VITALS — BP 172/86 | HR 68 | Temp 98.2°F | Ht 64.5 in | Wt 128.8 lb

## 2019-08-07 DIAGNOSIS — F0390 Unspecified dementia without behavioral disturbance: Secondary | ICD-10-CM

## 2019-08-07 DIAGNOSIS — I63512 Cerebral infarction due to unspecified occlusion or stenosis of left middle cerebral artery: Secondary | ICD-10-CM | POA: Diagnosis not present

## 2019-08-07 DIAGNOSIS — I10 Essential (primary) hypertension: Secondary | ICD-10-CM

## 2019-08-07 DIAGNOSIS — E785 Hyperlipidemia, unspecified: Secondary | ICD-10-CM

## 2019-08-07 NOTE — Progress Notes (Signed)
[facility] [address] [place]. [place] [address] [phone]       OFFICE FOLLOW UP NOTE  [name] Date of Birth:  10-12-1926 Medical Record Number:  [id number]   Reason for visit: Stroke follow up   CHIEF COMPLAINT:  Chief Complaint  Patient presents with  . Follow-up    Room 9, with daughter. stroke f/u. "a mood elevator has helped her. sometimes she gets confused"    HPI:   Stroke admission 03/06/2019: [name] with history of dementia, anxiety, HTN, HLD, falls, and osteoporosis  who presented from SNF with R sided weakness and difficulty with speech x 2 days.  CT head reviewed and was negative for acute stroke.  MRI brain reviewed and showed left paramedian pontine infarct along with small vessel disease.  MRA showed extensive intracranial arthrosclerosis and L>R MCA bifurcations and bilateral PCAs.  Carotid Dopplers unremarkable.  2D echo showed an EF of 50 to 55% without cardiac source of embolus.  LDL 175 and A1c 5.9.  Infarcts secondary to small vessel disease and recommended DAPT for 3 weeks and Plavix alone along with initiating atorvastatin 40 mg daily.  HTN stable.  Other stroke risk factors include advanced age and EtOH use but no prior history of stroke.  Residual deficits of right-sided weakness and mild dysarthria and was discharged back to SNF in stable condition.  04/17/2019 update: [facility].  [facility].  She continues to receive PT/OT/ST for ongoing residual deficits of right-sided weakness and dysarthria.  Is able to ambulate with a RW short distance and uses wheelchair for long distance. Dysarthria per nurse is "glitchy" and slurred but does note improvement.  She does need assistance with bathing and dressing and staff is unsure if she will be returning to  independent living.  She has completed 3 weeks DAPT and continues on Plavix alone without side effects bleeding or bruising.  Continues on atorvastatin without side effects myalgias.  Blood pressure obtained yesterday, 04/16/2019, with reading at 124/71.  Underlying dementia without any worsening.  Denies new or worsening stroke/TIA symptoms.  08/07/2019 update: [name] who is being seen today for stroke follow-up.  [facility] and care [facility].  Residual deficits of right hemiparesis.  Has since completed physical therapy but has experienced worsening gait since that time.  Transfers via wheelchair as she is nonambulatory.  Is able to transfer self with limited assistance.  Continues on clopidogrel and atorvastatin for secondary stroke prevention without side effects.  Blood pressure today elevated but per review of facility notes, typically stable.  Per review at facility, dementia has been stable with ongoing use of Aricept and Namenda.  MMSE at today's visit 20.  She also continues on Zoloft for depression management.  No further concerns at this time.   ROS:   14 system review of systems performed and negative with exception of see HPI  PMH:  Past Medical History:  Diagnosis Date  . Allergic rhinitis   . Anxiety   . Balance problem 04/28/2015  . Fatigue 04/28/2015  . Hearing loss 04/28/2015  . history of Fracture of right humerus 04/28/2015  . HLD (hyperlipidemia) 03/05/2019  . Hyperglycemia   . Hyperlipidemia   . Hypertension   . Memory loss  04/28/2015   04/26/2014 MMSE 28/30. Failed clock drawing. 04/21/15 MMSE 21/30. Failed clock drawing.   . Osteoporosis     PSH:  Past Surgical History:  Procedure Laterality Date  . ABDOMINAL HYSTERECTOMY    . CATARACT EXTRACTION  2010  . ELBOW SURGERY Right 1988  . FEMUR FRACTURE SURGERY Right 06/2014   [place] Dr. Dione HousekeeperJohn [name]  . HUMERUS FRACTURE SURGERY Right 2015    [place] Dr. Eber HongBrian [name]  . TONSILLECTOMY  1935    Social History:  Social History   Socioeconomic History  . Marital status: Unknown    Spouse name: Not on file  . Number of children: Not on file  . Years of education: Not on file  . Highest education level: Not on file  Occupational History  . Occupation: Housewife  Social Needs  . Financial resource strain: Not on file  . Food insecurity    Worry: Not on file    Inability: Not on file  . Transportation needs    Medical: Not on file    Non-medical: Not on file  Tobacco Use  . Smoking status: Never Smoker  . Smokeless tobacco: Never Used  Substance and Sexual Activity  . Alcohol use: Yes    Alcohol/week: 1.0 - 2.0 standard drinks    Types: 1 - 2 Glasses of wine per week    Comment: 1-2 glasses 1-2 times a week  . Drug use: No  . Sexual activity: Not on file  Lifestyle  . Physical activity    Days per week: Not on file    Minutes per session: Not on file  . Stress: Not on file  Relationships  . Social Musicianconnections    Talks on phone: Not on file    Gets together: Not on file    Attends religious service: Not on file    Active member of club or organization: Not on file    Attends meetings of clubs or organizations: Not on file    Relationship status: Not on file  . Intimate partner violence    Fear of current or ex partner: Not on file    Emotionally abused: Not on file    Physically abused: Not on file    Forced sexual activity: Not on file  Other Topics Concern  . Not on file  Social History Narrative   [facility]   Married Ana HillDonald   Never smoked   Alcohol -wine 1-2 daily   Caffeine coffee 2 daily   Exercise walking   Walks with walker   Living Will, POA, DNR    Family History:  Family History  Problem Relation Age of Onset  . Heart disease Mother   . Diabetes Daughter     Medications:   Current Outpatient Medications on File Prior to Visit  Medication Sig  Dispense Refill  . atorvastatin (LIPITOR) 40 MG tablet Take 1 tablet (40 mg total) by mouth daily at 6 PM.    . calcium carbonate (OSCAL) 1500 (600 Ca) MG TABS tablet Take 600 mg of elemental calcium by mouth daily with breakfast.    . Cholecalciferol (VITAMIN D3) 1000 UNITS CAPS Take 1,000 Units daily by mouth.     . clopidogrel (PLAVIX) 75 MG tablet Take 1 tablet (75 mg total) by mouth daily.    Marland Kitchen. donepezil (ARICEPT) 10 MG tablet TAKE ONE TABLET BY MOUTH DAILY TO PRESERVE MEMORY 90 tablet 1  . memantine (NAMENDA) 10 MG tablet Take  1 tablet (10 mg total) by mouth 2 (two) times daily. 180 tablet 3  . Omega-3 Fatty Acids (FISH OIL) 500 MG CAPS Take 1 capsule by mouth daily.    . pantoprazole (PROTONIX) 40 MG tablet Take 1 tablet (40 mg total) by mouth daily.    . sertraline (ZOLOFT) 25 MG tablet Take 25 mg by mouth daily.    Marland Kitchen. zinc oxide 20 % ointment Apply 1 application topically as needed for irritation. Apply to peri/buttocks after every incontinent episode     No current facility-administered medications on file prior to visit.     Allergies:   Allergies  Allergen Reactions  . Ace Inhibitors Other (See Comments)    Unknown reaction  . Almond Oil Rash  . Plum Pulp Rash     Physical Exam  Today's Vitals   08/07/19 1331  BP: (!) 172/86  Pulse: 68  Temp: 98.2 F (36.8 C)  Weight: 128 lb 12.8 oz (58.4 kg)  Height: 5' 4.5" (1.638 m)   Body mass index is 21.77 kg/m.   General: well developed, well nourished,  pleasant elderly Caucasian female, seated, in no evident distress Head: head normocephalic and atraumatic.   Neck: supple with no carotid or supraclavicular bruits Cardiovascular: regular rate and rhythm, no murmurs Musculoskeletal: no deformity Skin:  no rash/petichiae Vascular:  Normal pulses all extremities   Neurologic Exam Mental Status: Awake and fully alert. Oriented to place and time. Recent and remote memory diminished. Attention span, concentration and fund  of knowledge diminished. Mood and affect appropriate.  MMSE - Mini Mental State Exam 08/07/2019 07/19/2018 02/15/2018  Not completed: - (No Data) (No Data)  Orientation to time 5 4 5   Orientation to Place 4 5 5   Registration 3 3 2   Attention/ Calculation 0 4 2  Attention/Calculation-comments refused - -  Recall 1 2 0  Language- name 2 objects 2 2 2   Language- repeat 1 1 1   Language- follow 3 step command 3 3 3   Language- read & follow direction 1 1 1   Write a sentence 0 1 1  Copy design 0 1 0  Total score 20 27 22    Cranial Nerves: Fundoscopic exam reveals sharp disc margins. Pupils equal, briskly reactive to light. Extraocular movements full without nystagmus. Visual fields full to confrontation. Hearing intact. Facial sensation intact. Face, tongue, palate moves normally and symmetrically.  Motor: Normal bulk and tone.  Mild right hemiparesis greatest in right hip flexor Sensory.: intact to touch , pinprick , position and vibratory sensation.  Coordination: Rapid alternating movements normal in all extremities. Finger-to-nose and heel-to-shin performed accurately bilaterally. Gait and Station: Deferred Reflexes: 1+ and symmetric. Toes downgoing.      Diagnostic Data (Labs, Imaging, Testing)  CT HEAD WO CONTRAST 03/05/2019 IMPRESSION: No acute intracranial pathology.  Small-vessel white matter disease.  MR BRAIN WO CONTRAST MR MRA HEAD  03/05/2019 IMPRESSION: Acute infarction of the left para median pons. No swelling or hemorrhage in that location. Chronic small-vessel ischemic changes elsewhere within the pons and within the hemispheric white matter. MR angiography shows extensive intracranial atherosclerotic disease, with stenoses in the MCA bifurcation regions left more than right and of the posterior cerebral arteries on both sides. No large vessel occlusion identified.   ECHOCARDIOGRAM 03/06/2019 IMPRESSIONS  1. The left ventricle has low normal systolic function,  with an ejection fraction of 50-55%. The cavity size was normal. There is mildly increased left ventricular wall thickness. Left ventricular diastolic Doppler parameters are consistent with  impaired relaxation. Indeterminate filling pressures The E/e' is 8-15. No evidence of left ventricular regional wall motion abnormalities.  2. The right ventricle has normal systolic function. The cavity was normal. There is no increase in right ventricular wall thickness.  3. The mitral valve is degenerative. Mild thickening of the mitral valve leaflet. Moderate calcification of the mitral valve leaflet. There is mild mitral annular calcification present.  4. The tricuspid valve is grossly normal.  5. The aortic valve was not well visualized Moderate calcification of the aortic valve.  6. The interatrial septum was not well visualized.    ASSESSMENT: Ana [name] is a 85 y.o. year old female here with left paramedian pontine infarct on 03/05/2019 secondary to small vessel disease. Vascular risk factors include dementia, HTN and HLD.  Residual deficits of mild right hemiparesis    PLAN:  1. Left paramedian infarct: Continue clopidogrel 75 mg daily  and atorvastatin for secondary stroke prevention. Maintain strict control of hypertension with blood pressure goal below 130/90, diabetes with hemoglobin A1c goal below 6.5% and cholesterol with LDL cholesterol (bad cholesterol) goal below 70 mg/dL.  I also advised the patient to eat a healthy diet with plenty of whole grains, cereals, fruits and vegetables, exercise regularly with at least 30 minutes of continuous activity daily and maintain ideal body weight.   2. Mild right hemiparesis: Mild worsening since therapy completion likely secondary to deconditioning.  Recommend potentially restarting therapy if able and advised to continue to do exercises from a seated position 3. HTN: Advised to continue current treatment regimen.  Advised to continue to monitor  at home along with continued follow-up with PCP for management 4. HLD: Advised to continue current treatment regimen along with continued follow-up with PCP for future prescribing and monitoring of lipid panel 5. Dementia: Managed and monitored by PCP -currently on Namenda and Aricept     Follow up in 4 months or call earlier if needed   Greater than 50% of time during this 25-minute face-to-face visit was spent on counseling, explanation of diagnosis of left paramedian infarct, reviewing risk factor management of HTN and HLD, planning of further management along with potential future management, and discussion with patient and family answering all questions.    Venancio Poisson, AGNP-BC  [facility] [address] [place], [place] [address]  Phone [phone] Fax [phone] Note: This document was prepared with digital dictation and possible smart phrase technology. Any transcriptional errors that result from this process are unintentional.

## 2019-08-07 NOTE — Patient Instructions (Signed)
Potentially restart physical therapy if able to at facility  Continue clopidogrel 75 mg daily  and lipitor  for secondary stroke prevention  Continue to follow up with PCP regarding cholesterol and blood pressure management   Continue to monitor blood pressure at home  Maintain strict control of hypertension with blood pressure goal below 130/90, diabetes with hemoglobin A1c goal below 6.5% and cholesterol with LDL cholesterol (bad cholesterol) goal below 70 mg/dL. I also advised the patient to eat a healthy diet with plenty of whole grains, cereals, fruits and vegetables, exercise regularly and maintain ideal body weight.  Followup in the future with me in 4 months or call earlier if needed       Thank you for coming to see [place] at [facility]. I hope we have been able to provide you high quality care today.  You may receive a patient satisfaction survey over the next few weeks. We would appreciate your feedback and comments so that we may continue to improve ourselves and the health of our patients.

## 2019-08-08 NOTE — Progress Notes (Signed)
I agree with the above plan 

## 2019-08-09 DIAGNOSIS — M6281 Muscle weakness (generalized): Secondary | ICD-10-CM | POA: Diagnosis not present

## 2019-08-09 DIAGNOSIS — R2681 Unsteadiness on feet: Secondary | ICD-10-CM | POA: Diagnosis not present

## 2019-08-09 DIAGNOSIS — Z8781 Personal history of (healed) traumatic fracture: Secondary | ICD-10-CM | POA: Diagnosis not present

## 2019-08-09 DIAGNOSIS — R2689 Other abnormalities of gait and mobility: Secondary | ICD-10-CM | POA: Diagnosis not present

## 2019-08-09 DIAGNOSIS — I10 Essential (primary) hypertension: Secondary | ICD-10-CM | POA: Diagnosis not present

## 2019-08-09 DIAGNOSIS — M810 Age-related osteoporosis without current pathological fracture: Secondary | ICD-10-CM | POA: Diagnosis not present

## 2019-08-10 DIAGNOSIS — M6281 Muscle weakness (generalized): Secondary | ICD-10-CM | POA: Diagnosis not present

## 2019-08-10 DIAGNOSIS — R2681 Unsteadiness on feet: Secondary | ICD-10-CM | POA: Diagnosis not present

## 2019-08-10 DIAGNOSIS — R2689 Other abnormalities of gait and mobility: Secondary | ICD-10-CM | POA: Diagnosis not present

## 2019-08-10 DIAGNOSIS — M810 Age-related osteoporosis without current pathological fracture: Secondary | ICD-10-CM | POA: Diagnosis not present

## 2019-08-10 DIAGNOSIS — Z8781 Personal history of (healed) traumatic fracture: Secondary | ICD-10-CM | POA: Diagnosis not present

## 2019-08-10 DIAGNOSIS — I10 Essential (primary) hypertension: Secondary | ICD-10-CM | POA: Diagnosis not present

## 2019-08-14 ENCOUNTER — Encounter: Payer: Self-pay | Admitting: Nurse Practitioner

## 2019-08-14 ENCOUNTER — Non-Acute Institutional Stay (SKILLED_NURSING_FACILITY): Payer: Medicare Other | Admitting: Nurse Practitioner

## 2019-08-14 DIAGNOSIS — M810 Age-related osteoporosis without current pathological fracture: Secondary | ICD-10-CM | POA: Diagnosis not present

## 2019-08-14 DIAGNOSIS — K219 Gastro-esophageal reflux disease without esophagitis: Secondary | ICD-10-CM

## 2019-08-14 DIAGNOSIS — I10 Essential (primary) hypertension: Secondary | ICD-10-CM | POA: Diagnosis not present

## 2019-08-14 DIAGNOSIS — Z8781 Personal history of (healed) traumatic fracture: Secondary | ICD-10-CM | POA: Diagnosis not present

## 2019-08-14 DIAGNOSIS — F0390 Unspecified dementia without behavioral disturbance: Secondary | ICD-10-CM

## 2019-08-14 DIAGNOSIS — F419 Anxiety disorder, unspecified: Secondary | ICD-10-CM | POA: Diagnosis not present

## 2019-08-14 DIAGNOSIS — M6281 Muscle weakness (generalized): Secondary | ICD-10-CM | POA: Diagnosis not present

## 2019-08-14 DIAGNOSIS — I69392 Facial weakness following cerebral infarction: Secondary | ICD-10-CM | POA: Diagnosis not present

## 2019-08-14 DIAGNOSIS — I69322 Dysarthria following cerebral infarction: Secondary | ICD-10-CM | POA: Diagnosis not present

## 2019-08-14 NOTE — Assessment & Plan Note (Signed)
Her mood is stable, continue Sertraline 25mg qd.  

## 2019-08-14 NOTE — Assessment & Plan Note (Signed)
Blood pressure is controlled, continue Atorvastatin, Plavix for cardiovascular risk reduction.

## 2019-08-14 NOTE — Assessment & Plan Note (Signed)
Continue SNF [facility] for safety, care assistance, continue Memantine 10mg  bid, Donepezil 10mg  qd for memory.

## 2019-08-14 NOTE — Progress Notes (Signed)
Location:  [facility] Room Number: 18 Place of Service:  SNF (31) Provider:  Marlana Latus  NP  Virgie Dad, MD  Patient Care Team: Virgie Dad, MD as PCP - General (Internal Medicine) Ngetich, Nelda Bucks, NP as Nurse Practitioner [facility] Medicine)  Extended Emergency Contact Information Primary Emergency Contact: Gordan,[place] Address: [address]          [place], [place] [address] Johnnette Litter of [place] Phone: [phone] Relation: Daughter Secondary Emergency Contact: Verneda Skill Address: [address]          [place], [place] [address] [place] of [place] Phone: [phone] Relation: Spouse  Code Status:  DNR Goals of care: Advanced Directive information Advanced Directives 08/14/2019  Does Patient Have a Medical Advance Directive? Yes  Type of Paramedic of [place];Living will;Out of facility DNR (pink MOST or yellow form)  Does patient want to make changes to medical advance directive? No - Patient declined  Copy of [place] in Chart? Yes - validated most recent copy scanned in chart (See row information)  Pre-existing out of facility DNR order (yellow form or pink MOST form) Yellow form placed in chart (order not valid for inpatient use)     Chief Complaint  Patient presents with  . Medical Management of Chronic Issues    HPI:  Pt is a 85 y.o. female seen today for medical management of chronic diseases.    The patient resides in SNF [facility] for safety, care assistance, on Memantine 10mg  bid, Donepezil 10mg  qd for memory, ambulates with walker. Hx of depression, her mood is stable, on Sertraline 25mg  qd. GERD, stable, on Pantoprazole 40mg  qd.    Past Medical History:  Diagnosis Date  . Allergic rhinitis   . Anxiety   . Balance problem 04/28/2015  . Fatigue 04/28/2015  . Hearing loss 04/28/2015  . history of Fracture of right humerus 04/28/2015  . HLD (hyperlipidemia) 03/05/2019  .  Hyperglycemia   . Hyperlipidemia   . Hypertension   . Memory loss 04/28/2015   04/26/2014 MMSE 28/30. Failed clock drawing. 04/21/15 MMSE 21/30. Failed clock drawing.   . Osteoporosis    Past Surgical History:  Procedure Laterality Date  . ABDOMINAL HYSTERECTOMY    . CATARACT EXTRACTION  2010  . ELBOW SURGERY Right 1988  . FEMUR FRACTURE SURGERY Right 06/2014   [place], [place] Dr. Dorita Fray  . HUMERUS FRACTURE SURGERY Right 2015   [place], [place] Dr. Noemi Chapel  . TONSILLECTOMY  1935    Allergies  Allergen Reactions  . Ace Inhibitors Other (See Comments)    Unknown reaction  . Almond Oil Rash  . Plum Pulp Rash    Outpatient Encounter Medications as of 08/14/2019  Medication Sig  . atorvastatin (LIPITOR) 40 MG tablet Take 1 tablet (40 mg total) by mouth daily at 6 PM.  . calcium carbonate (OSCAL) 1500 (600 Ca) MG TABS tablet Take 600 mg of elemental calcium by mouth daily with breakfast.  . Cholecalciferol (VITAMIN D3) 1000 UNITS CAPS Take 1,000 Units daily by mouth.   . clopidogrel (PLAVIX) 75 MG tablet Take 1 tablet (75 mg total) by mouth daily.  Marland Kitchen donepezil (ARICEPT) 10 MG tablet TAKE ONE TABLET BY MOUTH DAILY TO PRESERVE MEMORY  . memantine (NAMENDA) 10 MG tablet Take 1 tablet (10 mg total) by mouth 2 (two) times daily.  . Omega-3 Fatty Acids (FISH OIL) 500 MG CAPS Take 1 capsule by mouth daily.  Marland Kitchen  pantoprazole (PROTONIX) 40 MG tablet Take 1 tablet (40 mg total) by mouth daily.  . sertraline (ZOLOFT) 25 MG tablet Take 25 mg by mouth daily.  Marland Kitchen zinc oxide 20 % ointment Apply 1 application topically as needed for irritation. Apply to peri/buttocks after every incontinent episode   No facility-administered encounter medications on file as of 08/14/2019.    ROS was provided with assistance of staff.  Review of Systems  Constitutional: Negative for activity change, appetite change, chills, diaphoresis, fatigue, fever and unexpected weight change.  HENT: Positive for hearing  loss. Negative for voice change.   Respiratory: Negative for cough, shortness of breath and wheezing.   Cardiovascular: Negative for chest pain, palpitations and leg swelling.  Gastrointestinal: Negative for abdominal distention, abdominal pain, constipation, diarrhea, nausea and vomiting.  Genitourinary: Negative for difficulty urinating, dysuria and urgency.  Musculoskeletal: Positive for gait problem.  Skin: Negative for color change and pallor.  Neurological: Negative for dizziness, speech difficulty, weakness and headaches.       Dementia.   Psychiatric/Behavioral: Negative for agitation, behavioral problems, hallucinations and sleep disturbance. The patient is not nervous/anxious.     Immunization History  Administered Date(s) Administered  . DTaP 05/12/2014  . Influenza, High Dose Seasonal PF 09/21/2017  . Influenza,inj,Quad PF,6+ Mos 09/14/2018  . Influenza-Unspecified 10/07/2014, 09/11/2015, 09/23/2016  . PPD Test 10/16/2014  . Pneumococcal Conjugate-13 10/20/2017  . Pneumococcal Polysaccharide-23 08/20/2011   Pertinent  Health Maintenance Due  Topic Date Due  . DEXA SCAN  03/21/1993  . INFLUENZA VACCINE  07/14/2019  . PNA vac Low Risk Adult  Completed   Fall Risk  11/21/2018 10/14/2017 02/17/2016 01/06/2015  Falls in the past year? 0 No No Yes  Comment - - - fx (R) humerus; fx (R) femur  Number falls in past yr: 0 - - 2 or more  Injury with Fall? 0 - - -  Risk for fall due to : - - - History of fall(s)   Functional Status Survey:    Vitals:   08/14/19 1001  BP: 136/74  Pulse: 77  Resp: 20  Temp: 97.7 F (36.5 C)  SpO2: 95%  Weight: 128 lb 1.6 oz (58.1 kg)  Height: 5\' 2"  (1.575 m)   Body mass index is 23.43 kg/m. Physical Exam Vitals signs and nursing note reviewed.  Constitutional:      General: She is not in acute distress.    Appearance: Normal appearance. She is normal weight. She is not ill-appearing or diaphoretic.  HENT:     Head: Normocephalic and  atraumatic.     Nose: Nose normal.  Eyes:     Extraocular Movements: Extraocular movements intact.     Conjunctiva/sclera: Conjunctivae normal.     Pupils: Pupils are equal, round, and reactive to light.  Neck:     Musculoskeletal: Normal range of motion and neck supple.  Cardiovascular:     Rate and Rhythm: Normal rate and regular rhythm.     Heart sounds: No murmur.  Pulmonary:     Breath sounds: No wheezing, rhonchi or rales.  Chest:     Chest wall: No tenderness.  Abdominal:     General: Bowel sounds are normal.     Palpations: Abdomen is soft.     Tenderness: There is no abdominal tenderness. There is no right CVA tenderness, left CVA tenderness or guarding.  Musculoskeletal:     Right lower leg: No edema.     Left lower leg: No edema.  Comments: W/c for mobility.   Skin:    General: Skin is warm and dry.  Neurological:     General: No focal deficit present.     Mental Status: She is alert. Mental status is at baseline.     Comments: Oriented to self.   Psychiatric:        Mood and Affect: Mood normal.        Behavior: Behavior normal.     Labs reviewed: Recent Labs    03/05/19 1156 03/06/19 0542 03/07/19 0457 06/28/19  NA 144 142 141 143  K 3.8 3.9 3.8 4.2  CL 109 107 108 108  CO2 26 26 25 26   GLUCOSE 96 98 105*  --   BUN 17 17 20  24*  CREATININE 0.92 1.03* 0.91 0.8  CALCIUM 9.4 9.4 9.3 8.9   Recent Labs    03/05/19 1156 03/06/19 0542 03/07/19 0457 06/28/19  AST 20 18 19  11*  ALT 14 13 12 15   ALKPHOS 49 43 47 60  BILITOT 0.6 1.0 0.6  --   PROT 6.2* 5.7* 5.7* 5.4  ALBUMIN 3.6 3.2* 3.2* 3.5   Recent Labs    03/05/19 1156 03/07/19 0457 06/28/19  WBC 9.2 8.9 7.7  NEUTROABS 5.2  --   --   HGB 13.0 12.5 11.4*  HCT 40.0 39.0 35*  MCV 99.5 96.8  --   PLT 209 193 202   Lab Results  Component Value Date   TSH 1.95 11/13/2018   Lab Results  Component Value Date   HGBA1C 5.9 (H) 03/06/2019   Lab Results  Component Value Date   CHOL 119  06/28/2019   HDL 38 06/28/2019   LDLCALC 65 06/28/2019   TRIG 78 06/28/2019   CHOLHDL 6.3 03/06/2019    Significant Diagnostic Results in last 30 days:  No results found.  Assessment/Plan Hypertension Blood pressure is controlled, continue Atorvastatin, Plavix for cardiovascular risk reduction.   GERD (gastroesophageal reflux disease) Stable, continue Pantoprazole 40mg  qd.   Dementia (HCC) Continue SNF [facility] for safety, care assistance, continue Memantine 10mg  bid, Donepezil 10mg  qd for memory.   Anxiety Her mood is stable, continue Sertraline 25mg  qd.      Family/ staff Communication: plan of care reviewed with the patient and charge nurse.   Labs/tests ordered:  none  Time spend 25 minutes.

## 2019-08-14 NOTE — Assessment & Plan Note (Signed)
Stable, continue Pantoprazole 40mg qd.  

## 2019-08-15 DIAGNOSIS — M6281 Muscle weakness (generalized): Secondary | ICD-10-CM | POA: Diagnosis not present

## 2019-08-15 DIAGNOSIS — M810 Age-related osteoporosis without current pathological fracture: Secondary | ICD-10-CM | POA: Diagnosis not present

## 2019-08-15 DIAGNOSIS — I69392 Facial weakness following cerebral infarction: Secondary | ICD-10-CM | POA: Diagnosis not present

## 2019-08-15 DIAGNOSIS — Z8781 Personal history of (healed) traumatic fracture: Secondary | ICD-10-CM | POA: Diagnosis not present

## 2019-08-15 DIAGNOSIS — I69322 Dysarthria following cerebral infarction: Secondary | ICD-10-CM | POA: Diagnosis not present

## 2019-08-15 DIAGNOSIS — I10 Essential (primary) hypertension: Secondary | ICD-10-CM | POA: Diagnosis not present

## 2019-08-17 DIAGNOSIS — I10 Essential (primary) hypertension: Secondary | ICD-10-CM | POA: Diagnosis not present

## 2019-08-17 DIAGNOSIS — Z8781 Personal history of (healed) traumatic fracture: Secondary | ICD-10-CM | POA: Diagnosis not present

## 2019-08-17 DIAGNOSIS — I69322 Dysarthria following cerebral infarction: Secondary | ICD-10-CM | POA: Diagnosis not present

## 2019-08-17 DIAGNOSIS — M6281 Muscle weakness (generalized): Secondary | ICD-10-CM | POA: Diagnosis not present

## 2019-08-17 DIAGNOSIS — M810 Age-related osteoporosis without current pathological fracture: Secondary | ICD-10-CM | POA: Diagnosis not present

## 2019-08-17 DIAGNOSIS — I69392 Facial weakness following cerebral infarction: Secondary | ICD-10-CM | POA: Diagnosis not present

## 2019-08-21 DIAGNOSIS — M6281 Muscle weakness (generalized): Secondary | ICD-10-CM | POA: Diagnosis not present

## 2019-08-21 DIAGNOSIS — I69392 Facial weakness following cerebral infarction: Secondary | ICD-10-CM | POA: Diagnosis not present

## 2019-08-21 DIAGNOSIS — M810 Age-related osteoporosis without current pathological fracture: Secondary | ICD-10-CM | POA: Diagnosis not present

## 2019-08-21 DIAGNOSIS — Z8781 Personal history of (healed) traumatic fracture: Secondary | ICD-10-CM | POA: Diagnosis not present

## 2019-08-21 DIAGNOSIS — I10 Essential (primary) hypertension: Secondary | ICD-10-CM | POA: Diagnosis not present

## 2019-08-21 DIAGNOSIS — I69322 Dysarthria following cerebral infarction: Secondary | ICD-10-CM | POA: Diagnosis not present

## 2019-08-22 DIAGNOSIS — I69322 Dysarthria following cerebral infarction: Secondary | ICD-10-CM | POA: Diagnosis not present

## 2019-08-22 DIAGNOSIS — M810 Age-related osteoporosis without current pathological fracture: Secondary | ICD-10-CM | POA: Diagnosis not present

## 2019-08-22 DIAGNOSIS — I69392 Facial weakness following cerebral infarction: Secondary | ICD-10-CM | POA: Diagnosis not present

## 2019-08-22 DIAGNOSIS — M6281 Muscle weakness (generalized): Secondary | ICD-10-CM | POA: Diagnosis not present

## 2019-08-22 DIAGNOSIS — I10 Essential (primary) hypertension: Secondary | ICD-10-CM | POA: Diagnosis not present

## 2019-08-22 DIAGNOSIS — Z8781 Personal history of (healed) traumatic fracture: Secondary | ICD-10-CM | POA: Diagnosis not present

## 2019-08-23 DIAGNOSIS — M6281 Muscle weakness (generalized): Secondary | ICD-10-CM | POA: Diagnosis not present

## 2019-08-23 DIAGNOSIS — I69322 Dysarthria following cerebral infarction: Secondary | ICD-10-CM | POA: Diagnosis not present

## 2019-08-23 DIAGNOSIS — I69392 Facial weakness following cerebral infarction: Secondary | ICD-10-CM | POA: Diagnosis not present

## 2019-08-23 DIAGNOSIS — M810 Age-related osteoporosis without current pathological fracture: Secondary | ICD-10-CM | POA: Diagnosis not present

## 2019-08-23 DIAGNOSIS — I10 Essential (primary) hypertension: Secondary | ICD-10-CM | POA: Diagnosis not present

## 2019-08-23 DIAGNOSIS — Z8781 Personal history of (healed) traumatic fracture: Secondary | ICD-10-CM | POA: Diagnosis not present

## 2019-08-27 DIAGNOSIS — I69392 Facial weakness following cerebral infarction: Secondary | ICD-10-CM | POA: Diagnosis not present

## 2019-08-27 DIAGNOSIS — M810 Age-related osteoporosis without current pathological fracture: Secondary | ICD-10-CM | POA: Diagnosis not present

## 2019-08-27 DIAGNOSIS — Z8781 Personal history of (healed) traumatic fracture: Secondary | ICD-10-CM | POA: Diagnosis not present

## 2019-08-27 DIAGNOSIS — I10 Essential (primary) hypertension: Secondary | ICD-10-CM | POA: Diagnosis not present

## 2019-08-27 DIAGNOSIS — M6281 Muscle weakness (generalized): Secondary | ICD-10-CM | POA: Diagnosis not present

## 2019-08-27 DIAGNOSIS — I69322 Dysarthria following cerebral infarction: Secondary | ICD-10-CM | POA: Diagnosis not present

## 2019-08-28 DIAGNOSIS — Z8781 Personal history of (healed) traumatic fracture: Secondary | ICD-10-CM | POA: Diagnosis not present

## 2019-08-28 DIAGNOSIS — M810 Age-related osteoporosis without current pathological fracture: Secondary | ICD-10-CM | POA: Diagnosis not present

## 2019-08-28 DIAGNOSIS — I69392 Facial weakness following cerebral infarction: Secondary | ICD-10-CM | POA: Diagnosis not present

## 2019-08-28 DIAGNOSIS — I69322 Dysarthria following cerebral infarction: Secondary | ICD-10-CM | POA: Diagnosis not present

## 2019-08-28 DIAGNOSIS — I10 Essential (primary) hypertension: Secondary | ICD-10-CM | POA: Diagnosis not present

## 2019-08-28 DIAGNOSIS — M6281 Muscle weakness (generalized): Secondary | ICD-10-CM | POA: Diagnosis not present

## 2019-08-30 DIAGNOSIS — I69392 Facial weakness following cerebral infarction: Secondary | ICD-10-CM | POA: Diagnosis not present

## 2019-08-30 DIAGNOSIS — Z8781 Personal history of (healed) traumatic fracture: Secondary | ICD-10-CM | POA: Diagnosis not present

## 2019-08-30 DIAGNOSIS — M810 Age-related osteoporosis without current pathological fracture: Secondary | ICD-10-CM | POA: Diagnosis not present

## 2019-08-30 DIAGNOSIS — M6281 Muscle weakness (generalized): Secondary | ICD-10-CM | POA: Diagnosis not present

## 2019-08-30 DIAGNOSIS — I10 Essential (primary) hypertension: Secondary | ICD-10-CM | POA: Diagnosis not present

## 2019-08-30 DIAGNOSIS — I69322 Dysarthria following cerebral infarction: Secondary | ICD-10-CM | POA: Diagnosis not present

## 2019-08-31 DIAGNOSIS — I69322 Dysarthria following cerebral infarction: Secondary | ICD-10-CM | POA: Diagnosis not present

## 2019-08-31 DIAGNOSIS — Z8781 Personal history of (healed) traumatic fracture: Secondary | ICD-10-CM | POA: Diagnosis not present

## 2019-08-31 DIAGNOSIS — I69392 Facial weakness following cerebral infarction: Secondary | ICD-10-CM | POA: Diagnosis not present

## 2019-08-31 DIAGNOSIS — M6281 Muscle weakness (generalized): Secondary | ICD-10-CM | POA: Diagnosis not present

## 2019-08-31 DIAGNOSIS — M810 Age-related osteoporosis without current pathological fracture: Secondary | ICD-10-CM | POA: Diagnosis not present

## 2019-08-31 DIAGNOSIS — I10 Essential (primary) hypertension: Secondary | ICD-10-CM | POA: Diagnosis not present

## 2019-09-03 DIAGNOSIS — Z8781 Personal history of (healed) traumatic fracture: Secondary | ICD-10-CM | POA: Diagnosis not present

## 2019-09-03 DIAGNOSIS — M6281 Muscle weakness (generalized): Secondary | ICD-10-CM | POA: Diagnosis not present

## 2019-09-03 DIAGNOSIS — M810 Age-related osteoporosis without current pathological fracture: Secondary | ICD-10-CM | POA: Diagnosis not present

## 2019-09-03 DIAGNOSIS — I69392 Facial weakness following cerebral infarction: Secondary | ICD-10-CM | POA: Diagnosis not present

## 2019-09-03 DIAGNOSIS — I10 Essential (primary) hypertension: Secondary | ICD-10-CM | POA: Diagnosis not present

## 2019-09-03 DIAGNOSIS — I69322 Dysarthria following cerebral infarction: Secondary | ICD-10-CM | POA: Diagnosis not present

## 2019-09-04 DIAGNOSIS — Z8781 Personal history of (healed) traumatic fracture: Secondary | ICD-10-CM | POA: Diagnosis not present

## 2019-09-04 DIAGNOSIS — I10 Essential (primary) hypertension: Secondary | ICD-10-CM | POA: Diagnosis not present

## 2019-09-04 DIAGNOSIS — I69392 Facial weakness following cerebral infarction: Secondary | ICD-10-CM | POA: Diagnosis not present

## 2019-09-04 DIAGNOSIS — M6281 Muscle weakness (generalized): Secondary | ICD-10-CM | POA: Diagnosis not present

## 2019-09-04 DIAGNOSIS — I69322 Dysarthria following cerebral infarction: Secondary | ICD-10-CM | POA: Diagnosis not present

## 2019-09-04 DIAGNOSIS — M810 Age-related osteoporosis without current pathological fracture: Secondary | ICD-10-CM | POA: Diagnosis not present

## 2019-09-05 DIAGNOSIS — M810 Age-related osteoporosis without current pathological fracture: Secondary | ICD-10-CM | POA: Diagnosis not present

## 2019-09-05 DIAGNOSIS — I69392 Facial weakness following cerebral infarction: Secondary | ICD-10-CM | POA: Diagnosis not present

## 2019-09-05 DIAGNOSIS — Z8781 Personal history of (healed) traumatic fracture: Secondary | ICD-10-CM | POA: Diagnosis not present

## 2019-09-05 DIAGNOSIS — M6281 Muscle weakness (generalized): Secondary | ICD-10-CM | POA: Diagnosis not present

## 2019-09-05 DIAGNOSIS — I10 Essential (primary) hypertension: Secondary | ICD-10-CM | POA: Diagnosis not present

## 2019-09-05 DIAGNOSIS — I69322 Dysarthria following cerebral infarction: Secondary | ICD-10-CM | POA: Diagnosis not present

## 2019-09-06 DIAGNOSIS — I69392 Facial weakness following cerebral infarction: Secondary | ICD-10-CM | POA: Diagnosis not present

## 2019-09-06 DIAGNOSIS — Z8781 Personal history of (healed) traumatic fracture: Secondary | ICD-10-CM | POA: Diagnosis not present

## 2019-09-06 DIAGNOSIS — I69322 Dysarthria following cerebral infarction: Secondary | ICD-10-CM | POA: Diagnosis not present

## 2019-09-06 DIAGNOSIS — M810 Age-related osteoporosis without current pathological fracture: Secondary | ICD-10-CM | POA: Diagnosis not present

## 2019-09-06 DIAGNOSIS — M6281 Muscle weakness (generalized): Secondary | ICD-10-CM | POA: Diagnosis not present

## 2019-09-06 DIAGNOSIS — I10 Essential (primary) hypertension: Secondary | ICD-10-CM | POA: Diagnosis not present

## 2019-09-11 DIAGNOSIS — I69392 Facial weakness following cerebral infarction: Secondary | ICD-10-CM | POA: Diagnosis not present

## 2019-09-11 DIAGNOSIS — M6281 Muscle weakness (generalized): Secondary | ICD-10-CM | POA: Diagnosis not present

## 2019-09-11 DIAGNOSIS — Z8781 Personal history of (healed) traumatic fracture: Secondary | ICD-10-CM | POA: Diagnosis not present

## 2019-09-11 DIAGNOSIS — I69322 Dysarthria following cerebral infarction: Secondary | ICD-10-CM | POA: Diagnosis not present

## 2019-09-11 DIAGNOSIS — M810 Age-related osteoporosis without current pathological fracture: Secondary | ICD-10-CM | POA: Diagnosis not present

## 2019-09-11 DIAGNOSIS — I10 Essential (primary) hypertension: Secondary | ICD-10-CM | POA: Diagnosis not present

## 2019-09-12 DIAGNOSIS — I69322 Dysarthria following cerebral infarction: Secondary | ICD-10-CM | POA: Diagnosis not present

## 2019-09-12 DIAGNOSIS — I69392 Facial weakness following cerebral infarction: Secondary | ICD-10-CM | POA: Diagnosis not present

## 2019-09-12 DIAGNOSIS — M6281 Muscle weakness (generalized): Secondary | ICD-10-CM | POA: Diagnosis not present

## 2019-09-12 DIAGNOSIS — Z8781 Personal history of (healed) traumatic fracture: Secondary | ICD-10-CM | POA: Diagnosis not present

## 2019-09-12 DIAGNOSIS — M810 Age-related osteoporosis without current pathological fracture: Secondary | ICD-10-CM | POA: Diagnosis not present

## 2019-09-12 DIAGNOSIS — I10 Essential (primary) hypertension: Secondary | ICD-10-CM | POA: Diagnosis not present

## 2019-09-13 DIAGNOSIS — I10 Essential (primary) hypertension: Secondary | ICD-10-CM | POA: Diagnosis not present

## 2019-09-13 DIAGNOSIS — M6281 Muscle weakness (generalized): Secondary | ICD-10-CM | POA: Diagnosis not present

## 2019-09-13 DIAGNOSIS — R2681 Unsteadiness on feet: Secondary | ICD-10-CM | POA: Diagnosis not present

## 2019-09-13 DIAGNOSIS — M810 Age-related osteoporosis without current pathological fracture: Secondary | ICD-10-CM | POA: Diagnosis not present

## 2019-09-13 DIAGNOSIS — Z8781 Personal history of (healed) traumatic fracture: Secondary | ICD-10-CM | POA: Diagnosis not present

## 2019-09-13 DIAGNOSIS — R279 Unspecified lack of coordination: Secondary | ICD-10-CM | POA: Diagnosis not present

## 2019-09-14 DIAGNOSIS — I10 Essential (primary) hypertension: Secondary | ICD-10-CM | POA: Diagnosis not present

## 2019-09-14 DIAGNOSIS — M6281 Muscle weakness (generalized): Secondary | ICD-10-CM | POA: Diagnosis not present

## 2019-09-14 DIAGNOSIS — R2681 Unsteadiness on feet: Secondary | ICD-10-CM | POA: Diagnosis not present

## 2019-09-14 DIAGNOSIS — Z8781 Personal history of (healed) traumatic fracture: Secondary | ICD-10-CM | POA: Diagnosis not present

## 2019-09-14 DIAGNOSIS — M810 Age-related osteoporosis without current pathological fracture: Secondary | ICD-10-CM | POA: Diagnosis not present

## 2019-09-14 DIAGNOSIS — R279 Unspecified lack of coordination: Secondary | ICD-10-CM | POA: Diagnosis not present

## 2019-09-17 ENCOUNTER — Non-Acute Institutional Stay (SKILLED_NURSING_FACILITY): Payer: Medicare Other | Admitting: Internal Medicine

## 2019-09-17 ENCOUNTER — Encounter: Payer: Self-pay | Admitting: Internal Medicine

## 2019-09-17 DIAGNOSIS — R2681 Unsteadiness on feet: Secondary | ICD-10-CM | POA: Diagnosis not present

## 2019-09-17 DIAGNOSIS — M810 Age-related osteoporosis without current pathological fracture: Secondary | ICD-10-CM | POA: Diagnosis not present

## 2019-09-17 DIAGNOSIS — R279 Unspecified lack of coordination: Secondary | ICD-10-CM | POA: Diagnosis not present

## 2019-09-17 DIAGNOSIS — E7849 Other hyperlipidemia: Secondary | ICD-10-CM | POA: Diagnosis not present

## 2019-09-17 DIAGNOSIS — M6281 Muscle weakness (generalized): Secondary | ICD-10-CM | POA: Diagnosis not present

## 2019-09-17 DIAGNOSIS — F0390 Unspecified dementia without behavioral disturbance: Secondary | ICD-10-CM | POA: Diagnosis not present

## 2019-09-17 DIAGNOSIS — I6302 Cerebral infarction due to thrombosis of basilar artery: Secondary | ICD-10-CM

## 2019-09-17 DIAGNOSIS — Z8781 Personal history of (healed) traumatic fracture: Secondary | ICD-10-CM | POA: Diagnosis not present

## 2019-09-17 DIAGNOSIS — I10 Essential (primary) hypertension: Secondary | ICD-10-CM | POA: Diagnosis not present

## 2019-09-17 NOTE — Progress Notes (Signed)
[facility] Room Number: 7118 Place of Service:  SNF (31) Provider:  Einar CrowGupta,  MD  Mahlon GammonGupta,  L, MD  Patient Care Team: Mahlon GammonGupta,  L, MD as PCP - General (Internal Medicine) Ngetich, Donalee Citrininah C, NP as Nurse Practitioner [facility](Family Medicine)  Extended Emergency Contact Information Primary Emergency Contact: Gordan,[place] Address: [address]          [place], [place] [address] [place] Home Phone: [phone] Relation: Daughter Secondary Emergency Contact: Iven FinnGarrigan,Donald Address: [address]          [place], [place] [address] [place] Home Phone: [phone] Relation: Spouse  Code Status:  DNR Goals of care: Advanced Directive information Advanced Directives 09/17/2019  Does Patient Have a Medical Advance Directive? Yes  Type of Estate agentAdvance Directive Healthcare Power of [place];Living will;Out of facility DNR (pink MOST or yellow form)  Does patient want to make changes to medical advance directive? No - Patient declined  Copy of Healthcare Power of Attorney in Chart? Yes - validated most recent copy scanned in chart (See row information)  Pre-existing out of facility DNR order (yellow form or pink MOST form) Yellow form placed in chart (order not valid for inpatient use)     Chief Complaint  Patient presents with  . Medical Management of Chronic Issues    HPI:  Pt is a 85 y.o. female seen today for medical management of chronic diseases.   Patient has history of dementia, anxiety,history of right humerus fracture, hypertension and hyperlipidemia. Patient was admitted in the hospitalfrom 03/23-03/25 for Acute CVA With weakness in her Right Leg and Arm MRI showed Acute infarction on Left Para median POns  Has recovered well since then.  She is now a long-term resident of SNF Has adjusted  to the facility well.  Her mood is better.  Did not have any acute complaints today. Has gained some weight.  Past Medical History:   Diagnosis Date  . Allergic rhinitis   . Anxiety   . Balance problem 04/28/2015  . Fatigue 04/28/2015  . Hearing loss 04/28/2015  . history of Fracture of right humerus 04/28/2015  . HLD (hyperlipidemia) 03/05/2019  . Hyperglycemia   . Hyperlipidemia   . Hypertension   . Memory loss 04/28/2015   04/26/2014 MMSE 28/30. Failed clock drawing. 04/21/15 MMSE 21/30. Failed clock drawing.   . Osteoporosis    Past Surgical History:  Procedure Laterality Date  . ABDOMINAL HYSTERECTOMY    . CATARACT EXTRACTION  2010  . ELBOW SURGERY Right 1988  . FEMUR FRACTURE SURGERY Right 06/2014   [place], [place] Dr. Dione HousekeeperJohn Vanderhoof  . HUMERUS FRACTURE SURGERY Right 2015   [place], [place] Dr. Eber HongBrian Miller  . TONSILLECTOMY  1935    Allergies  Allergen Reactions  . Ace Inhibitors Other (See Comments)    Unknown reaction  . Almond Oil Rash  . Plum Pulp Rash    Allergies as of 09/17/2019      Reactions   Ace Inhibitors Other (See Comments)   Unknown reaction   Almond Oil Rash   Plum Pulp Rash      Medication List       Accurate as of September 17, 2019 10:13 AM. If you have any questions, ask your nurse or doctor.        atorvastatin 40 MG tablet Commonly known as: LIPITOR Take 1 tablet (40 mg total) by mouth daily at 6 PM.   calcium carbonate 1500 (600 Ca) MG Tabs  tablet Commonly known as: OSCAL Take 600 mg of elemental calcium by mouth daily with breakfast.   clopidogrel 75 MG tablet Commonly known as: PLAVIX Take 1 tablet (75 mg total) by mouth daily.   donepezil 10 MG tablet Commonly known as: ARICEPT TAKE ONE TABLET BY MOUTH DAILY TO PRESERVE MEMORY   Fish Oil 500 MG Caps Take 1 capsule by mouth daily.   memantine 10 MG tablet Commonly known as: Namenda Take 1 tablet (10 mg total) by mouth 2 (two) times daily.   pantoprazole 40 MG tablet Commonly known as: Protonix Take 1 tablet (40 mg total) by mouth daily.   sertraline 25 MG tablet Commonly known as: ZOLOFT Take 25 mg by  mouth daily.   Vitamin D3 25 MCG (1000 UT) Caps Take 1,000 Units daily by mouth.   zinc oxide 20 % ointment Apply 1 application topically as needed for irritation. Apply to peri/buttocks after every incontinent episode       Review of Systems  Review of Systems  Constitutional: Negative for activity change, appetite change, chills, diaphoresis, fatigue and fever.  HENT: Negative for mouth sores, postnasal drip, rhinorrhea, sinus pain and sore throat.   Respiratory: Negative for apnea, cough, chest tightness, shortness of breath and wheezing.   Cardiovascular: Negative for chest pain, palpitations and leg swelling.  Gastrointestinal: Negative for abdominal distention, abdominal pain, constipation, diarrhea, nausea and vomiting.  Genitourinary: Negative for dysuria and frequency.  Musculoskeletal: Negative for arthralgias, joint swelling and myalgias.  Skin: Negative for rash.  Neurological: Negative for dizziness, syncope, weakness, light-headedness and numbness.  Psychiatric/Behavioral: Negative for behavioral problems, confusion and sleep disturbance.     Immunization History  Administered Date(s) Administered  . DTaP 05/12/2014  . Influenza, High Dose Seasonal PF 09/21/2017  . Influenza,inj,Quad PF,6+ Mos 09/14/2018  . Influenza-Unspecified 10/07/2014, 09/11/2015, 09/23/2016  . PPD Test 10/16/2014  . Pneumococcal Conjugate-13 10/20/2017  . Pneumococcal Polysaccharide-23 08/20/2011   Pertinent  Health Maintenance Due  Topic Date Due  . DEXA SCAN  03/21/1993  . INFLUENZA VACCINE  07/14/2019  . PNA vac Low Risk Adult  Completed   Fall Risk  11/21/2018 10/14/2017 02/17/2016 01/06/2015  Falls in the past year? 0 No No Yes  Comment - - - fx (R) humerus; fx (R) femur  Number falls in past yr: 0 - - 2 or more  Injury with Fall? 0 - - -  Risk for fall due to : - - - History of fall(s)   Functional Status Survey:    Vitals:   09/17/19 1009  BP: (!) 158/78  Pulse: 65  Resp:  20  Temp: 97.9 F (36.6 C)  SpO2: 95%  Weight: 130 lb 8 oz (59.2 kg)  Height: 5\' 2"  (1.575 m)   Body mass index is 23.87 kg/m. Physical Exam  Constitutional:  Well-developed and well-nourished.  HENT:  Head: Normocephalic.  Mouth/Throat: Oropharynx is clear and moist.  Eyes: Pupils are equal, round, and reactive to light.  Neck: Neck supple.  Cardiovascular: Normal rate and normal heart sounds.  No murmur heard. Pulmonary/Chest: Effort normal and breath sounds normal. No respiratory distress. No wheezes. She has no rales.  Abdominal: Soft. Bowel sounds are normal. No distension. There is no tenderness. There is no rebound.  Musculoskeletal: No edema.  Lymphadenopathy: none Neurological:MIld weakness in Right LE Follows all commands Skin: Skin is warm and dry.  Psychiatric: Normal mood and affect. Behavior is normal. Thought content normal.    Labs reviewed: Recent Labs  03/05/19 1156 03/06/19 0542 03/07/19 0457 06/28/19  NA 144 142 141 143  K 3.8 3.9 3.8 4.2  CL 109 107 108 108  CO2 26 26 25 26   GLUCOSE 96 98 105*  --   BUN 17 17 20  24*  CREATININE 0.92 1.03* 0.91 0.8  CALCIUM 9.4 9.4 9.3 8.9   Recent Labs    03/05/19 1156 03/06/19 0542 03/07/19 0457 06/28/19  AST 20 18 19  11*  ALT 14 13 12 15   ALKPHOS 49 43 47 60  BILITOT 0.6 1.0 0.6  --   PROT 6.2* 5.7* 5.7* 5.4  ALBUMIN 3.6 3.2* 3.2* 3.5   Recent Labs    03/05/19 1156 03/07/19 0457 06/28/19  WBC 9.2 8.9 7.7  NEUTROABS 5.2  --   --   HGB 13.0 12.5 11.4*  HCT 40.0 39.0 35*  MCV 99.5 96.8  --   PLT 209 193 202   Lab Results  Component Value Date   TSH 1.95 11/13/2018   Lab Results  Component Value Date   HGBA1C 5.9 (H) 03/06/2019   Lab Results  Component Value Date   CHOL 119 06/28/2019   HDL 38 06/28/2019   LDLCALC 65 06/28/2019   TRIG 78 06/28/2019   CHOLHDL 6.3 03/06/2019    Significant Diagnostic Results in last 30 days:  No results found.  Assessment/Plan H/O  CVA Work up  including Echo and Carotids was negative On Statin now Also ON Plavix Continue to dependent for her transfers and ADLS  Hypertension BP has been midily high in facility Will start her on Norvasc 2.5 mg QD  Hyperlipidemia On Statin Repeat Fasting Lipid Panel  Dementia MMSE 20/30 In Neurology Office On Aricept and Namenda Continue Supportive Care  Depression Continue on Zoloft  Family/ staff Communication:   Labs/tests ordered:  CBC,CMP,TSH, Lipid Panel  Total time spent in this patient care encounter was  25_  minutes; greater than 50% of the visit spent counseling patient and staff, reviewing records , Labs and coordinating care for problems addressed at this encounter.

## 2019-09-18 DIAGNOSIS — M6281 Muscle weakness (generalized): Secondary | ICD-10-CM | POA: Diagnosis not present

## 2019-09-18 DIAGNOSIS — M810 Age-related osteoporosis without current pathological fracture: Secondary | ICD-10-CM | POA: Diagnosis not present

## 2019-09-18 DIAGNOSIS — R279 Unspecified lack of coordination: Secondary | ICD-10-CM | POA: Diagnosis not present

## 2019-09-18 DIAGNOSIS — R2681 Unsteadiness on feet: Secondary | ICD-10-CM | POA: Diagnosis not present

## 2019-09-18 DIAGNOSIS — Z8781 Personal history of (healed) traumatic fracture: Secondary | ICD-10-CM | POA: Diagnosis not present

## 2019-09-18 DIAGNOSIS — I10 Essential (primary) hypertension: Secondary | ICD-10-CM | POA: Diagnosis not present

## 2019-09-19 DIAGNOSIS — R2681 Unsteadiness on feet: Secondary | ICD-10-CM | POA: Diagnosis not present

## 2019-09-19 DIAGNOSIS — M6281 Muscle weakness (generalized): Secondary | ICD-10-CM | POA: Diagnosis not present

## 2019-09-19 DIAGNOSIS — I10 Essential (primary) hypertension: Secondary | ICD-10-CM | POA: Diagnosis not present

## 2019-09-19 DIAGNOSIS — Z8781 Personal history of (healed) traumatic fracture: Secondary | ICD-10-CM | POA: Diagnosis not present

## 2019-09-19 DIAGNOSIS — M810 Age-related osteoporosis without current pathological fracture: Secondary | ICD-10-CM | POA: Diagnosis not present

## 2019-09-19 DIAGNOSIS — R279 Unspecified lack of coordination: Secondary | ICD-10-CM | POA: Diagnosis not present

## 2019-09-20 DIAGNOSIS — M6281 Muscle weakness (generalized): Secondary | ICD-10-CM | POA: Diagnosis not present

## 2019-09-20 DIAGNOSIS — Z8781 Personal history of (healed) traumatic fracture: Secondary | ICD-10-CM | POA: Diagnosis not present

## 2019-09-20 DIAGNOSIS — I10 Essential (primary) hypertension: Secondary | ICD-10-CM | POA: Diagnosis not present

## 2019-09-20 DIAGNOSIS — R6889 Other general symptoms and signs: Secondary | ICD-10-CM | POA: Diagnosis not present

## 2019-09-20 DIAGNOSIS — R279 Unspecified lack of coordination: Secondary | ICD-10-CM | POA: Diagnosis not present

## 2019-09-20 DIAGNOSIS — R2681 Unsteadiness on feet: Secondary | ICD-10-CM | POA: Diagnosis not present

## 2019-09-20 DIAGNOSIS — R7989 Other specified abnormal findings of blood chemistry: Secondary | ICD-10-CM | POA: Diagnosis not present

## 2019-09-20 DIAGNOSIS — M810 Age-related osteoporosis without current pathological fracture: Secondary | ICD-10-CM | POA: Diagnosis not present

## 2019-09-20 LAB — CBC AND DIFFERENTIAL
HCT: 38 (ref 36–46)
Hemoglobin: 12 (ref 12.0–16.0)
Platelets: 233 (ref 150–399)
WBC: 7.5

## 2019-09-20 LAB — HEPATIC FUNCTION PANEL
ALT: 16 (ref 7–35)
AST: 12 — AB (ref 13–35)
Alkaline Phosphatase: 72 (ref 25–125)
Bilirubin, Total: 0.4

## 2019-09-20 LAB — BASIC METABOLIC PANEL
BUN: 33 — AB (ref 4–21)
Creatinine: 0.9 (ref 0.5–1.1)
Glucose: 100
Potassium: 4.2 (ref 3.4–5.3)
Sodium: 144 (ref 137–147)

## 2019-09-21 DIAGNOSIS — R2681 Unsteadiness on feet: Secondary | ICD-10-CM | POA: Diagnosis not present

## 2019-09-21 DIAGNOSIS — M810 Age-related osteoporosis without current pathological fracture: Secondary | ICD-10-CM | POA: Diagnosis not present

## 2019-09-21 DIAGNOSIS — Z8781 Personal history of (healed) traumatic fracture: Secondary | ICD-10-CM | POA: Diagnosis not present

## 2019-09-21 DIAGNOSIS — M6281 Muscle weakness (generalized): Secondary | ICD-10-CM | POA: Diagnosis not present

## 2019-09-21 DIAGNOSIS — R279 Unspecified lack of coordination: Secondary | ICD-10-CM | POA: Diagnosis not present

## 2019-09-21 DIAGNOSIS — I10 Essential (primary) hypertension: Secondary | ICD-10-CM | POA: Diagnosis not present

## 2019-09-25 DIAGNOSIS — M810 Age-related osteoporosis without current pathological fracture: Secondary | ICD-10-CM | POA: Diagnosis not present

## 2019-09-25 DIAGNOSIS — R279 Unspecified lack of coordination: Secondary | ICD-10-CM | POA: Diagnosis not present

## 2019-09-25 DIAGNOSIS — M6281 Muscle weakness (generalized): Secondary | ICD-10-CM | POA: Diagnosis not present

## 2019-09-25 DIAGNOSIS — Z8781 Personal history of (healed) traumatic fracture: Secondary | ICD-10-CM | POA: Diagnosis not present

## 2019-09-25 DIAGNOSIS — I10 Essential (primary) hypertension: Secondary | ICD-10-CM | POA: Diagnosis not present

## 2019-09-25 DIAGNOSIS — R2681 Unsteadiness on feet: Secondary | ICD-10-CM | POA: Diagnosis not present

## 2019-09-26 DIAGNOSIS — Z8781 Personal history of (healed) traumatic fracture: Secondary | ICD-10-CM | POA: Diagnosis not present

## 2019-09-26 DIAGNOSIS — R2681 Unsteadiness on feet: Secondary | ICD-10-CM | POA: Diagnosis not present

## 2019-09-26 DIAGNOSIS — M6281 Muscle weakness (generalized): Secondary | ICD-10-CM | POA: Diagnosis not present

## 2019-09-26 DIAGNOSIS — I10 Essential (primary) hypertension: Secondary | ICD-10-CM | POA: Diagnosis not present

## 2019-09-26 DIAGNOSIS — R279 Unspecified lack of coordination: Secondary | ICD-10-CM | POA: Diagnosis not present

## 2019-09-26 DIAGNOSIS — M810 Age-related osteoporosis without current pathological fracture: Secondary | ICD-10-CM | POA: Diagnosis not present

## 2019-09-27 DIAGNOSIS — M810 Age-related osteoporosis without current pathological fracture: Secondary | ICD-10-CM | POA: Diagnosis not present

## 2019-09-27 DIAGNOSIS — M6281 Muscle weakness (generalized): Secondary | ICD-10-CM | POA: Diagnosis not present

## 2019-09-27 DIAGNOSIS — Z8781 Personal history of (healed) traumatic fracture: Secondary | ICD-10-CM | POA: Diagnosis not present

## 2019-09-27 DIAGNOSIS — I10 Essential (primary) hypertension: Secondary | ICD-10-CM | POA: Diagnosis not present

## 2019-09-27 DIAGNOSIS — R2681 Unsteadiness on feet: Secondary | ICD-10-CM | POA: Diagnosis not present

## 2019-09-27 DIAGNOSIS — R279 Unspecified lack of coordination: Secondary | ICD-10-CM | POA: Diagnosis not present

## 2019-10-02 DIAGNOSIS — M6281 Muscle weakness (generalized): Secondary | ICD-10-CM | POA: Diagnosis not present

## 2019-10-02 DIAGNOSIS — M810 Age-related osteoporosis without current pathological fracture: Secondary | ICD-10-CM | POA: Diagnosis not present

## 2019-10-02 DIAGNOSIS — I10 Essential (primary) hypertension: Secondary | ICD-10-CM | POA: Diagnosis not present

## 2019-10-02 DIAGNOSIS — Z8781 Personal history of (healed) traumatic fracture: Secondary | ICD-10-CM | POA: Diagnosis not present

## 2019-10-02 DIAGNOSIS — R279 Unspecified lack of coordination: Secondary | ICD-10-CM | POA: Diagnosis not present

## 2019-10-02 DIAGNOSIS — R2681 Unsteadiness on feet: Secondary | ICD-10-CM | POA: Diagnosis not present

## 2019-10-03 DIAGNOSIS — Z8781 Personal history of (healed) traumatic fracture: Secondary | ICD-10-CM | POA: Diagnosis not present

## 2019-10-03 DIAGNOSIS — I10 Essential (primary) hypertension: Secondary | ICD-10-CM | POA: Diagnosis not present

## 2019-10-03 DIAGNOSIS — M810 Age-related osteoporosis without current pathological fracture: Secondary | ICD-10-CM | POA: Diagnosis not present

## 2019-10-03 DIAGNOSIS — R279 Unspecified lack of coordination: Secondary | ICD-10-CM | POA: Diagnosis not present

## 2019-10-03 DIAGNOSIS — R2681 Unsteadiness on feet: Secondary | ICD-10-CM | POA: Diagnosis not present

## 2019-10-03 DIAGNOSIS — M6281 Muscle weakness (generalized): Secondary | ICD-10-CM | POA: Diagnosis not present

## 2019-10-05 DIAGNOSIS — Z8781 Personal history of (healed) traumatic fracture: Secondary | ICD-10-CM | POA: Diagnosis not present

## 2019-10-05 DIAGNOSIS — M6281 Muscle weakness (generalized): Secondary | ICD-10-CM | POA: Diagnosis not present

## 2019-10-05 DIAGNOSIS — R2681 Unsteadiness on feet: Secondary | ICD-10-CM | POA: Diagnosis not present

## 2019-10-05 DIAGNOSIS — R279 Unspecified lack of coordination: Secondary | ICD-10-CM | POA: Diagnosis not present

## 2019-10-05 DIAGNOSIS — I10 Essential (primary) hypertension: Secondary | ICD-10-CM | POA: Diagnosis not present

## 2019-10-05 DIAGNOSIS — M810 Age-related osteoporosis without current pathological fracture: Secondary | ICD-10-CM | POA: Diagnosis not present

## 2019-10-08 DIAGNOSIS — R2681 Unsteadiness on feet: Secondary | ICD-10-CM | POA: Diagnosis not present

## 2019-10-08 DIAGNOSIS — M810 Age-related osteoporosis without current pathological fracture: Secondary | ICD-10-CM | POA: Diagnosis not present

## 2019-10-08 DIAGNOSIS — M6281 Muscle weakness (generalized): Secondary | ICD-10-CM | POA: Diagnosis not present

## 2019-10-08 DIAGNOSIS — I10 Essential (primary) hypertension: Secondary | ICD-10-CM | POA: Diagnosis not present

## 2019-10-08 DIAGNOSIS — R279 Unspecified lack of coordination: Secondary | ICD-10-CM | POA: Diagnosis not present

## 2019-10-08 DIAGNOSIS — Z8781 Personal history of (healed) traumatic fracture: Secondary | ICD-10-CM | POA: Diagnosis not present

## 2019-10-09 ENCOUNTER — Non-Acute Institutional Stay (SKILLED_NURSING_FACILITY): Payer: Medicare Other | Admitting: Nurse Practitioner

## 2019-10-09 ENCOUNTER — Encounter: Payer: Self-pay | Admitting: Nurse Practitioner

## 2019-10-09 DIAGNOSIS — I10 Essential (primary) hypertension: Secondary | ICD-10-CM

## 2019-10-09 DIAGNOSIS — K219 Gastro-esophageal reflux disease without esophagitis: Secondary | ICD-10-CM

## 2019-10-09 DIAGNOSIS — R2681 Unsteadiness on feet: Secondary | ICD-10-CM | POA: Diagnosis not present

## 2019-10-09 DIAGNOSIS — M810 Age-related osteoporosis without current pathological fracture: Secondary | ICD-10-CM | POA: Diagnosis not present

## 2019-10-09 DIAGNOSIS — F419 Anxiety disorder, unspecified: Secondary | ICD-10-CM

## 2019-10-09 DIAGNOSIS — F0150 Vascular dementia without behavioral disturbance: Secondary | ICD-10-CM | POA: Diagnosis not present

## 2019-10-09 DIAGNOSIS — R279 Unspecified lack of coordination: Secondary | ICD-10-CM | POA: Diagnosis not present

## 2019-10-09 DIAGNOSIS — Z8781 Personal history of (healed) traumatic fracture: Secondary | ICD-10-CM | POA: Diagnosis not present

## 2019-10-09 DIAGNOSIS — M6281 Muscle weakness (generalized): Secondary | ICD-10-CM | POA: Diagnosis not present

## 2019-10-09 LAB — CHLORIDE
Albumin: 3.8
Calcium: 9.2
Carbon Dioxide, Total: 29
Chloride: 107
Globulin: 2.1
Total Protein: 5.9 — AB (ref 6.4–8.2)

## 2019-10-09 NOTE — Assessment & Plan Note (Signed)
Stable, continue Pantoprazole 40mg qd.  

## 2019-10-09 NOTE — Assessment & Plan Note (Signed)
Her mood is stable, weight is stable, continue Sertraline 25mg  qd.

## 2019-10-09 NOTE — Assessment & Plan Note (Signed)
Blood pressure is controlled, continue Amlodipine 2.5mg  qd, continue Plavix and Atorvastatin for cardiovascular risk reduction and Hx of stroke.

## 2019-10-09 NOTE — Assessment & Plan Note (Signed)
Continue SNF, [facility] for safety, care assistance, continue Memantine 10mg  bid, Donepezil 10mg  qd.

## 2019-10-09 NOTE — Progress Notes (Signed)
Location:   SNF [place] Room Number: 18 Place of Service:  SNF (31) Provider:  Janaia Kozel. Hiliana Eilts NP  Virgie Dad, MD  Patient Care Team: Virgie Dad, MD as PCP - General (Internal Medicine) Ngetich, Nelda Bucks, NP as Nurse Practitioner [facility])  Extended Emergency Contact Information Primary Emergency Contact: Gordan,[place] Address: [address]          [place], [place] [address] Johnnette Litter of [place] Phone: [phone] Relation: Daughter Secondary Emergency Contact: Verneda Skill Address: [address]          [place], [place] [address] [place] Phone: [phone] Relation: Spouse  Code Status:  DNR Goals of care: Advanced Directive information Advanced Directives 10/09/2019  Does Patient Have a Medical Advance Directive? Yes  Type of Advance Directive Out of facility DNR (pink MOST or yellow form);Living will  Does patient want to make changes to medical advance directive? No - Patient declined  Copy of [place] in Chart? Yes - validated most recent copy scanned in chart (See row information)  Pre-existing out of facility DNR order (yellow form or pink MOST form) Yellow form placed in chart (order not valid for inpatient use)     Chief Complaint  Patient presents with  . Medical Management of Chronic Issues  . Health Maintenance    Dexa and influenza vaccine    HPI:  Pt is a 85 y.o. female seen today for medical management of chronic diseases.    The patient resides in SNF [facility] for safety, care assistance, taking Memantine 10mg  bid, Donepezil 10mg  qd for memory. Her mood is stable, on Sertraline 25mg  qd. Hx of GERD, stable, on Pantoprazole 40mg  qd. HTN, blood pressure is controlled on Amlodipine 2.5mg  qd, Atorvastatin 40mg  qd, Plavix 75mg  qd for cardiovascular risk reduction.    Past Medical History:  Diagnosis Date  . Allergic rhinitis   . Anxiety   . Balance problem 04/28/2015  . Fatigue 04/28/2015  .  Hearing loss 04/28/2015  . history of Fracture of right humerus 04/28/2015  . HLD (hyperlipidemia) 03/05/2019  . Hyperglycemia   . Hyperlipidemia   . Hypertension   . Memory loss 04/28/2015   04/26/2014 MMSE 28/30. Failed clock drawing. 04/21/15 MMSE 21/30. Failed clock drawing.   . Osteoporosis    Past Surgical History:  Procedure Laterality Date  . ABDOMINAL HYSTERECTOMY    . CATARACT EXTRACTION  2010  . ELBOW SURGERY Right 1988  . FEMUR FRACTURE SURGERY Right 06/2014   [place], [place] Dr. Dorita Fray  . HUMERUS FRACTURE SURGERY Right 2015   [place], [place] Dr. Noemi Chapel  . TONSILLECTOMY  1935    Allergies  Allergen Reactions  . Ace Inhibitors Other (See Comments)    Unknown reaction  . Almond Oil Rash  . Plum Pulp Rash    Allergies as of 10/09/2019      Reactions   Ace Inhibitors Other (See Comments)   Unknown reaction   Almond Oil Rash   Plum Pulp Rash      Medication List       Accurate as of October 09, 2019  2:58 PM. If you have any questions, ask your nurse or doctor.        amLODipine 2.5 MG tablet Commonly known as: NORVASC Take 2.5 mg by mouth daily.   atorvastatin 40 MG tablet Commonly known as: LIPITOR Take 1 tablet (40 mg total) by mouth daily at 6 PM.   calcium carbonate 1500 (  600 Ca) MG Tabs tablet Commonly known as: OSCAL Take 600 mg of elemental calcium by mouth daily with breakfast.   clopidogrel 75 MG tablet Commonly known as: PLAVIX Take 1 tablet (75 mg total) by mouth daily.   donepezil 10 MG tablet Commonly known as: ARICEPT TAKE ONE TABLET BY MOUTH DAILY TO PRESERVE MEMORY   Fish Oil 500 MG Caps Take 1 capsule by mouth daily.   memantine 10 MG tablet Commonly known as: Namenda Take 1 tablet (10 mg total) by mouth 2 (two) times daily.   pantoprazole 40 MG tablet Commonly known as: Protonix Take 1 tablet (40 mg total) by mouth daily.   sertraline 25 MG tablet Commonly known as: ZOLOFT Take 25 mg by mouth daily.    Vitamin D3 25 MCG (1000 UT) Caps Take 1,000 Units daily by mouth.   zinc oxide 20 % ointment Apply 1 application topically as needed for irritation. Apply to peri/buttocks after every incontinent episode      ROS was provided with assistance of staff.  Review of Systems  Constitutional: Negative for activity change, appetite change, chills, diaphoresis, fatigue, fever and unexpected weight change.  HENT: Positive for hearing loss. Negative for congestion and voice change.   Respiratory: Negative for cough, shortness of breath and wheezing.   Cardiovascular: Negative for chest pain, palpitations and leg swelling.  Gastrointestinal: Negative for abdominal distention, abdominal pain, constipation, diarrhea, nausea and vomiting.  Genitourinary: Negative for difficulty urinating, dysuria and urgency.  Musculoskeletal: Positive for gait problem.  Skin: Negative for color change and pallor.  Neurological: Negative for dizziness, speech difficulty, weakness and headaches.       Dementia  Psychiatric/Behavioral: Positive for confusion. Negative for agitation, behavioral problems, hallucinations and sleep disturbance. The patient is not nervous/anxious.     Immunization History  Administered Date(s) Administered  . DTaP 05/12/2014  . Influenza, High Dose Seasonal PF 09/21/2017  . Influenza,inj,Quad PF,6+ Mos 09/14/2018  . Influenza-Unspecified 10/07/2014, 09/11/2015, 09/23/2016  . PPD Test 10/16/2014  . Pneumococcal Conjugate-13 10/20/2017  . Pneumococcal Polysaccharide-23 08/20/2011   Pertinent  Health Maintenance Due  Topic Date Due  . DEXA SCAN  03/21/1993  . INFLUENZA VACCINE  07/14/2019  . PNA vac Low Risk Adult  Completed   Fall Risk  11/21/2018 10/14/2017 02/17/2016 01/06/2015  Falls in the past year? 0 No No Yes  Comment - - - fx (R) humerus; fx (R) femur  Number falls in past yr: 0 - - 2 or more  Injury with Fall? 0 - - -  Risk for fall due to : - - - History of fall(s)    Functional Status Survey:    Vitals:   10/09/19 0953  BP: 121/75  Pulse: 76  Temp: (!) 96.9 F (36.1 C)  SpO2: 97%  Weight: 132 lb 8 oz (60.1 kg)  Height: 5\' 2"  (1.575 m)   Body mass index is 24.23 kg/m. Physical Exam Vitals signs and nursing note reviewed.  Constitutional:      General: She is not in acute distress.    Appearance: Normal appearance. She is normal weight. She is not ill-appearing, toxic-appearing or diaphoretic.  HENT:     Head: Normocephalic and atraumatic.     Nose: Nose normal.     Mouth/Throat:     Mouth: Mucous membranes are moist.  Eyes:     Extraocular Movements: Extraocular movements intact.     Conjunctiva/sclera: Conjunctivae normal.     Pupils: Pupils are equal, round, and reactive to  light.  Neck:     Musculoskeletal: Normal range of motion and neck supple.  Cardiovascular:     Rate and Rhythm: Normal rate and regular rhythm.     Heart sounds: No murmur.  Pulmonary:     Breath sounds: No wheezing, rhonchi or rales.  Abdominal:     General: Bowel sounds are normal. There is no distension.     Palpations: Abdomen is soft.     Tenderness: There is no abdominal tenderness. There is no right CVA tenderness, left CVA tenderness, guarding or rebound.  Musculoskeletal:     Right lower leg: No edema.     Left lower leg: No edema.  Skin:    General: Skin is warm and dry.  Neurological:     General: No focal deficit present.     Mental Status: She is alert. Mental status is at baseline.     Motor: No weakness.     Coordination: Coordination normal.     Gait: Gait abnormal.     Comments: Oriented to self.   Psychiatric:        Mood and Affect: Mood normal.        Behavior: Behavior normal.     Labs reviewed: Recent Labs    03/05/19 1156 03/06/19 0542 03/07/19 0457 06/28/19 09/20/19  NA 144 142 141 143 144  K 3.8 3.9 3.8 4.2 4.2  CL 109 107 108 108 107  CO2 26 26 25 26 29   GLUCOSE 96 98 105*  --   --   BUN 17 17 20  24* 33*   CREATININE 0.92 1.03* 0.91 0.8 0.9  CALCIUM 9.4 9.4 9.3 8.9 9.2   Recent Labs    03/05/19 1156 03/06/19 0542 03/07/19 0457 06/28/19 09/20/19  AST 20 18 19  11* 12*  ALT 14 13 12 15 16   ALKPHOS 49 43 47 60 72  BILITOT 0.6 1.0 0.6  --   --   PROT 6.2* 5.7* 5.7* 5.4 5.9*  ALBUMIN 3.6 3.2* 3.2* 3.5 3.8   Recent Labs    03/05/19 1156 03/07/19 0457 06/28/19 09/20/19  WBC 9.2 8.9 7.7 7.5  NEUTROABS 5.2  --   --   --   HGB 13.0 12.5 11.4* 12.0  HCT 40.0 39.0 35* 38  MCV 99.5 96.8  --   --   PLT 209 193 202 233   Lab Results  Component Value Date   TSH 1.95 11/13/2018   Lab Results  Component Value Date   HGBA1C 5.9 (H) 03/06/2019   Lab Results  Component Value Date   CHOL 119 06/28/2019   HDL 38 06/28/2019   LDLCALC 65 06/28/2019   TRIG 78 06/28/2019   CHOLHDL 6.3 03/06/2019    Significant Diagnostic Results in last 30 days:  No results found.  Assessment/Plan Anxiety Her mood is stable, weight is stable, continue Sertraline 25mg  qd.   Hypertension Blood pressure is controlled, continue Amlodipine 2.5mg  qd, continue Plavix and Atorvastatin for cardiovascular risk reduction and Hx of stroke.   GERD (gastroesophageal reflux disease) Stable, continue Pantoprazole 40mg  qd.   Dementia (HCC) Continue SNF, [facility] for safety, care assistance, continue Memantine 10mg  bid, Donepezil 10mg  qd.   Osteoporosis Dr. 06/30/2019 advised no DEXA R>B at age of 65 in setting of advanced dementia.      Family/ staff Communication: plan of care reviewed with the patient and charge nurse.   Labs/tests ordered:  none  Time spend 25 minutes.

## 2019-10-09 NOTE — Assessment & Plan Note (Signed)
Dr. Lyndel Safe advised no DEXA R>B at age of 7 in setting of advanced dementia.

## 2019-11-16 ENCOUNTER — Non-Acute Institutional Stay (SKILLED_NURSING_FACILITY): Payer: Medicare Other | Admitting: Nurse Practitioner

## 2019-11-16 ENCOUNTER — Encounter: Payer: Self-pay | Admitting: Nurse Practitioner

## 2019-11-16 DIAGNOSIS — I6302 Cerebral infarction due to thrombosis of basilar artery: Secondary | ICD-10-CM

## 2019-11-16 DIAGNOSIS — F419 Anxiety disorder, unspecified: Secondary | ICD-10-CM

## 2019-11-16 DIAGNOSIS — F0150 Vascular dementia without behavioral disturbance: Secondary | ICD-10-CM

## 2019-11-16 DIAGNOSIS — I10 Essential (primary) hypertension: Secondary | ICD-10-CM

## 2019-11-16 DIAGNOSIS — M810 Age-related osteoporosis without current pathological fracture: Secondary | ICD-10-CM | POA: Diagnosis not present

## 2019-11-16 DIAGNOSIS — K219 Gastro-esophageal reflux disease without esophagitis: Secondary | ICD-10-CM

## 2019-11-16 DIAGNOSIS — R635 Abnormal weight gain: Secondary | ICD-10-CM

## 2019-11-16 NOTE — Progress Notes (Signed)
Location:   [facility] Room Number: 18 Place of Service:  SNF (31) Provider:  Mast, Man NP  Mahlon GammonGupta, Anjali L, MD  Patient Care Team: Mahlon GammonGupta, Anjali L, MD as PCP - General (Internal Medicine) Ngetich, Donalee Citrininah C, NP as Nurse Practitioner [facility](Family Medicine)  Extended Emergency Contact Information Primary Emergency Contact: Gordan,[place] Address: [address]          [place], [place] [address] [place] Phone: [phone] Relation: Daughter Secondary Emergency Contact: Iven FinnGarrigan,Donald Address: [address]          [place], [place] [address] [place] Home Phone: [phone] Relation: Spouse  Code Status:  DNR Goals of care: Advanced Directive information Advanced Directives 10/09/2019  Does Patient Have a Medical Advance Directive? Yes  Type of Advance Directive Out of facility DNR (pink MOST or yellow form);Living will  Does patient want to make changes to medical advance directive? No - Patient declined  Copy of Healthcare Power of Attorney in Chart? Yes - validated most recent copy scanned in chart (See row information)  Pre-existing out of facility DNR order (yellow form or pink MOST form) Yellow form placed in chart (order not valid for inpatient use)     Chief Complaint  Patient presents with  . Medical Management of Chronic Issues  . Health Maintenance    Dexa scan    HPI:  Pt is a 85 y.o. female seen today for medical management of chronic diseases.    [facility], on Memantine 10mg  bid, Donepezil 10mg  qd. for memory, her mood is stable, on Sertraline 25mg  qd. GERD, stable, taking Pantoprazole 40mg  qd. HTN, blood pressure is controlled, on Amlodipine 2.5mg  qd. HX of CVA, on Plavix, Atorvastatin.    Past Medical History:  Diagnosis Date  . Allergic rhinitis   . Anxiety   . Balance problem 04/28/2015  . Fatigue 04/28/2015  . Hearing loss 04/28/2015  . history of Fracture of right  humerus 04/28/2015  . HLD (hyperlipidemia) 03/05/2019  . Hyperglycemia   . Hyperlipidemia   . Hypertension   . Memory loss 04/28/2015   04/26/2014 MMSE 28/30. Failed clock drawing. 04/21/15 MMSE 21/30. Failed clock drawing.   . Osteoporosis    Past Surgical History:  Procedure Laterality Date  . ABDOMINAL HYSTERECTOMY    . CATARACT EXTRACTION  2010  . ELBOW SURGERY Right 1988  . FEMUR FRACTURE SURGERY Right 06/2014   [place], [place] Dr. Dione HousekeeperJohn [place]hoof  . HUMERUS FRACTURE SURGERY Right 2015   [place], [place] Dr. Eber HongBrian Miller  . TONSILLECTOMY  1935    Allergies  Allergen Reactions  . Ace Inhibitors Other (See Comments)    Unknown reaction  . Almond Oil Rash  . Plum Pulp Rash    Allergies as of 11/16/2019      Reactions   Ace Inhibitors Other (See Comments)   Unknown reaction   Almond Oil Rash   Plum Pulp Rash      Medication List       Accurate as of November 16, 2019 11:59 PM. If you have any questions, ask your nurse or doctor.        amLODipine 2.5 MG tablet Commonly known as: NORVASC Take 2.5 mg by mouth daily.   atorvastatin 40 MG tablet Commonly known as: LIPITOR Take 1 tablet (40 mg total) by mouth daily at 6 PM.   calcium carbonate 1500 (600 Ca) MG Tabs tablet Commonly known as: OSCAL  Take 600 mg of elemental calcium by mouth daily with breakfast.   clopidogrel 75 MG tablet Commonly known as: PLAVIX Take 1 tablet (75 mg total) by mouth daily.   donepezil 10 MG tablet Commonly known as: ARICEPT TAKE ONE TABLET BY MOUTH DAILY TO PRESERVE MEMORY   Fish Oil 500 MG Caps Take 1 capsule by mouth daily.   memantine 10 MG tablet Commonly known as: Namenda Take 1 tablet (10 mg total) by mouth 2 (two) times daily.   pantoprazole 40 MG tablet Commonly known as: Protonix Take 1 tablet (40 mg total) by mouth daily.   sertraline 25 MG tablet Commonly known as: ZOLOFT Take 25 mg by mouth daily.   Vitamin D3 25 MCG (1000 UT) Caps Take 1,000 Units daily by  mouth.   zinc oxide 20 % ointment Apply 1 application topically as needed for irritation. Apply to peri/buttocks after every incontinent episode      ROS was provided with assistance of staff.  Review of Systems  Constitutional: Positive for unexpected weight change. Negative for activity change, appetite change, chills, diaphoresis, fatigue and fever.       Weight gained #5 Ibs in the past 4-6 wks.   HENT: Positive for hearing loss. Negative for congestion and voice change.   Eyes: Negative for visual disturbance.  Respiratory: Negative for cough, shortness of breath and wheezing.   Cardiovascular: Negative for chest pain, palpitations and leg swelling.  Gastrointestinal: Negative for abdominal distention, abdominal pain, constipation, diarrhea, nausea and vomiting.  Genitourinary: Negative for difficulty urinating, dysuria and urgency.  Musculoskeletal: Positive for gait problem.  Skin: Negative for color change and pallor.  Neurological: Negative for dizziness, speech difficulty, weakness and headaches.       Dementia  Psychiatric/Behavioral: Positive for confusion. Negative for agitation, behavioral problems, hallucinations and sleep disturbance. The patient is not nervous/anxious.     Immunization History  Administered Date(s) Administered  . DTaP 05/12/2014  . Influenza, High Dose Seasonal PF 09/21/2017  . Influenza,inj,Quad PF,6+ Mos 09/14/2018  . Influenza-Unspecified 10/07/2014, 09/11/2015, 09/23/2016  . PPD Test 10/16/2014  . Pneumococcal Conjugate-13 10/20/2017  . Pneumococcal Polysaccharide-23 08/20/2011   Pertinent  Health Maintenance Due  Topic Date Due  . DEXA SCAN  03/21/1993  . INFLUENZA VACCINE  Completed  . PNA vac Low Risk Adult  Completed   Fall Risk  11/21/2018 10/14/2017 02/17/2016 01/06/2015  Falls in the past year? 0 No No Yes  Comment - - - fx (R) humerus; fx (R) femur  Number falls in past yr: 0 - - 2 or more  Injury with Fall? 0 - - -  Risk for  fall due to : - - - History of fall(s)   Functional Status Survey:    Vitals:   11/16/19 1606  BP: 117/68  Pulse: 67  Resp: 20  Temp: 98 F (36.7 C)  SpO2: 96%  Weight: 137 lb 3.2 oz (62.2 kg)  Height: 5\' 2"  (1.575 m)   Body mass index is 25.09 kg/m. Physical Exam Vitals signs and nursing note reviewed.  Constitutional:      General: She is not in acute distress.    Appearance: Normal appearance. She is normal weight. She is not ill-appearing, toxic-appearing or diaphoretic.  HENT:     Head: Normocephalic and atraumatic.     Nose: Nose normal.     Mouth/Throat:     Mouth: Mucous membranes are moist.  Eyes:     Extraocular Movements: Extraocular movements intact.  Conjunctiva/sclera: Conjunctivae normal.     Pupils: Pupils are equal, round, and reactive to light.  Neck:     Musculoskeletal: Normal range of motion and neck supple.  Cardiovascular:     Rate and Rhythm: Normal rate and regular rhythm.     Heart sounds: No murmur.  Pulmonary:     Breath sounds: No wheezing or rales.  Abdominal:     General: Bowel sounds are normal. There is no distension.     Palpations: Abdomen is soft.     Tenderness: There is no abdominal tenderness. There is no right CVA tenderness, left CVA tenderness, guarding or rebound.  Musculoskeletal:     Right lower leg: No edema.     Left lower leg: No edema.  Skin:    General: Skin is warm and dry.  Neurological:     General: No focal deficit present.     Mental Status: She is alert. Mental status is at baseline.     Motor: No weakness.     Coordination: Coordination normal.     Gait: Gait abnormal.     Comments: Oriented to self.   Psychiatric:        Mood and Affect: Mood normal.        Behavior: Behavior normal.     Labs reviewed: Recent Labs    03/05/19 1156 03/06/19 0542 03/07/19 0457 06/28/19 09/20/19  NA 144 142 141 143 144  K 3.8 3.9 3.8 4.2 4.2  CL 109 107 108 108 107  CO2 GLUCOSE 96 98 105*   --   --   BUN 24* 33*  CREATININE 0.92 1.03* 0.91 0.8 0.9  CALCIUM 9.4 9.4 9.3 8.9 9.2   Recent Labs    03/05/19 1156 03/06/19 0542 03/07/19 0457 06/28/19 09/20/19  AST 11* 12*  ALT ALKPHOS 49 43 47 60 72  BILITOT 0.6 1.0 0.6  --   --   PROT 6.2* 5.7* 5.7* 5.4 5.9*  ALBUMIN 3.6 3.2* 3.2* 3.5 3.8   Recent Labs    03/05/19 1156 03/07/19 0457 06/28/19 09/20/19  WBC 9.2 8.9 7.7 7.5  NEUTROABS 5.2  --   --   --   HGB 13.0 12.5 11.4* 12.0  HCT 40.0 39.0 35* 38  MCV 99.5 96.8  --   --   PLT 209 193 202 233   Lab Results  Component Value Date   TSH 1.95 11/13/2018   Lab Results  Component Value Date   HGBA1C 5.9 (H) 03/06/2019   Lab Results  Component Value Date   CHOL 119 06/28/2019   HDL 38 06/28/2019   LDLCALC 65 06/28/2019   TRIG 78 06/28/2019   CHOLHDL 6.3 03/06/2019    Significant Diagnostic Results in last 30 days:  No results found.  Assessment/Plan Osteoporosis Continue Vit D, Ca.   Dementia (HCC) Continue SNF [facility] for safety, care assistance, continue Memantine, Donepezil.   Anxiety Stable, continue Sertraline  qd.   GERD (gastroesophageal reflux disease) Stable, continue Pantoprazole  qd.   Stroke (cerebrum) (HCC) Right sided weakness even if with muscle strength 4-5/5, continue Plavix, Atorvastatin.   Hypertension Blood pressure is controlled, continue Amlodipine 2.5mg  qd.   Weight gain #5Ibs in the past 4-6 wks, f/u dietary, observe weight.      Family/ staff Communication: plan of care reviewed with the patient and charge nurse.   Labs/tests ordered:  none  Time spend 25 minutes.

## 2019-11-17 ENCOUNTER — Encounter: Payer: Self-pay | Admitting: Nurse Practitioner

## 2019-11-17 DIAGNOSIS — R635 Abnormal weight gain: Secondary | ICD-10-CM | POA: Insufficient documentation

## 2019-11-17 NOTE — Assessment & Plan Note (Signed)
Continue SNF [facility] for safety, care assistance, continue Memantine, Donepezil.  

## 2019-11-17 NOTE — Assessment & Plan Note (Signed)
Stable, continue Sertraline 25mg qd.  

## 2019-11-17 NOTE — Assessment & Plan Note (Signed)
Blood pressure is controlled, continue Amlodipine 2.5mg qd.  

## 2019-11-17 NOTE — Assessment & Plan Note (Signed)
Stable, continue Pantoprazole 40mg qd.  

## 2019-11-17 NOTE — Assessment & Plan Note (Signed)
#  5Ibs in the past 4-6 wks, f/u dietary, observe weight.

## 2019-11-17 NOTE — Assessment & Plan Note (Signed)
Right sided weakness even if with muscle strength 4-5/5, continue Plavix, Atorvastatin.

## 2019-11-17 NOTE — Assessment & Plan Note (Signed)
Continue Vit D, Ca 

## 2019-12-18 ENCOUNTER — Ambulatory Visit: Payer: Medicare Other | Admitting: Adult Health

## 2019-12-21 ENCOUNTER — Encounter: Payer: Self-pay | Admitting: Nurse Practitioner

## 2019-12-21 ENCOUNTER — Non-Acute Institutional Stay (SKILLED_NURSING_FACILITY): Payer: PPO | Admitting: Nurse Practitioner

## 2019-12-21 DIAGNOSIS — F419 Anxiety disorder, unspecified: Secondary | ICD-10-CM

## 2019-12-21 DIAGNOSIS — F0150 Vascular dementia without behavioral disturbance: Secondary | ICD-10-CM | POA: Diagnosis not present

## 2019-12-21 DIAGNOSIS — W19XXXA Unspecified fall, initial encounter: Secondary | ICD-10-CM | POA: Diagnosis not present

## 2019-12-21 DIAGNOSIS — I6302 Cerebral infarction due to thrombosis of basilar artery: Secondary | ICD-10-CM | POA: Diagnosis not present

## 2019-12-21 DIAGNOSIS — R296 Repeated falls: Secondary | ICD-10-CM | POA: Insufficient documentation

## 2019-12-21 DIAGNOSIS — R269 Unspecified abnormalities of gait and mobility: Secondary | ICD-10-CM | POA: Diagnosis not present

## 2019-12-21 DIAGNOSIS — I10 Essential (primary) hypertension: Secondary | ICD-10-CM | POA: Diagnosis not present

## 2019-12-21 NOTE — Progress Notes (Signed)
Location:  [facility] Room Number: 18 Place of Service:  SNF (31) Provider: Yoselyn Mcglade X. Ashlynd Michna, NP  [name], MD  Patient Care Team: [name], MD as PCP - General (Internal Medicine) Ngetich, Donalee Citrin, NP as Nurse Practitioner [facility] Medicine)  Extended Emergency Contact Information Primary Emergency Contact: Gordan,[place] Address: [address]          [place], [place] [address] [place] of [place] Home Phone: [phone] Relation: Daughter Secondary Emergency Contact: Iven Finn Address: [address]          [place], [place] [address] [place] of [place] Home Phone: [phone] Relation: Spouse  Code Status:  DNR Goals of care: Advanced Directive information Advanced Directives 12/21/2019  Does Patient Have a Medical Advance Directive? Yes  Type of Advance Directive Out of facility DNR (pink MOST or yellow form)  Does patient want to make changes to medical advance directive? No - Patient declined  Copy of Healthcare Power of Attorney in Chart? -  Pre-existing out of facility DNR order (yellow form or pink MOST form) Yellow form placed in chart (order not valid for inpatient use)     Chief Complaint  Patient presents with  . Acute Visit    Fall    HPI:  Pt is a 86 y.o. female seen today for an acute visit for fall 12/20/19, mechanical fall, no apparent injury. The patient resides in SNF [facility] for safety, care assistance, on Memantine 10mg  bid, Donepezil 10mg  qd for memory, ambulates with w/c. Hx of HTN, blood pressure is controlled, on Amlodipine. Hx of CVA, slightly weakness RLEl, taking Plavix, Atorvastatin. Her mood is stable, on Sertraline 25mg  qd.    Past Medical History:  Diagnosis Date  . Allergic rhinitis   . Anxiety   . Balance problem 04/28/2015  . Fatigue 04/28/2015  . Hearing loss 04/28/2015  . history of Fracture of right humerus 04/28/2015  . HLD (hyperlipidemia) 03/05/2019  . Hyperglycemia   . Hyperlipidemia   .  Hypertension   . Memory loss 04/28/2015   04/26/2014 MMSE 28/30. Failed clock drawing. 04/21/15 MMSE 21/30. Failed clock drawing.   . Osteoporosis    Past Surgical History:  Procedure Laterality Date  . ABDOMINAL HYSTERECTOMY    . CATARACT EXTRACTION  2010  . ELBOW SURGERY Right 1988  . FEMUR FRACTURE SURGERY Right 06/2014   [place]15 Dr. 2011  . HUMERUS FRACTURE SURGERY Right 2015   [place] Dr. 07/2014  . TONSILLECTOMY  1935    Allergies  Allergen Reactions  . Ace Inhibitors Other (See Comments)    Unknown reaction  . Almond Oil Rash  . Plum Pulp Rash    Outpatient Encounter Medications as of 12/21/2019  Medication Sig  . amLODipine (NORVASC) 2.5 MG tablet Take 2.5 mg by mouth daily.  Dione Housekeeper atorvastatin (LIPITOR) 40 MG tablet Take 1 tablet (40 mg total) by mouth daily at 6 PM.  . calcium carbonate (OSCAL) 1500 (600 Ca) MG TABS tablet Take 600 mg of elemental calcium by mouth daily with breakfast.  . Cholecalciferol (VITAMIN D3) 1000 UNITS CAPS Take 1,000 Units daily by mouth.   . clopidogrel (PLAVIX) 75 MG tablet Take 1 tablet (75 mg total) by mouth daily.  Eber Hong donepezil (ARICEPT) 10 MG tablet TAKE ONE TABLET BY MOUTH DAILY TO PRESERVE MEMORY  . memantine (NAMENDA) 10 MG tablet Take 1 tablet (10 mg total) by mouth 2 (two) times daily.  . Omega-3 Fatty Acids (FISH OIL)  500 MG CAPS Take 1 capsule by mouth daily.  . pantoprazole (PROTONIX) 40 MG tablet Take 1 tablet (40 mg total) by mouth daily.  . sertraline (ZOLOFT) 25 MG tablet Take 25 mg by mouth daily.  Marland Kitchen zinc oxide 20 % ointment Apply 1 application topically as needed for irritation. Apply to peri/buttocks after every incontinent episode   No facility-administered encounter medications on file as of 12/21/2019.   ROS was provided with assistance of staff.  Review of Systems  Constitutional: Negative for activity change, appetite change, chills, diaphoresis, fatigue and fever.  HENT: Positive for hearing  loss. Negative for congestion and voice change.   Respiratory: Negative for cough, shortness of breath and wheezing.   Cardiovascular: Negative for chest pain, palpitations and leg swelling.  Gastrointestinal: Negative for abdominal distention, abdominal pain, constipation, diarrhea, nausea and vomiting.  Genitourinary: Negative for difficulty urinating, dysuria and urgency.  Musculoskeletal: Positive for arthralgias and gait problem.  Skin: Negative for color change and pallor.  Neurological: Positive for weakness. Negative for dizziness, facial asymmetry, speech difficulty and headaches.       Dementia.   Psychiatric/Behavioral: Positive for confusion. Negative for agitation, behavioral problems, hallucinations and sleep disturbance. The patient is not nervous/anxious.     Immunization History  Administered Date(s) Administered  . DTaP 05/12/2014  . Influenza, High Dose Seasonal PF 09/21/2017  . Influenza,inj,Quad PF,6+ Mos 09/14/2018  . Influenza-Unspecified 10/07/2014, 09/11/2015, 09/23/2016  . PPD Test 10/16/2014  . Pneumococcal Conjugate-13 10/20/2017  . Pneumococcal Polysaccharide-23 08/20/2011   Pertinent  Health Maintenance Due  Topic Date Due  . DEXA SCAN  03/21/1993  . INFLUENZA VACCINE  Completed  . PNA vac Low Risk Adult  Completed   Fall Risk  11/21/2018 10/14/2017 02/17/2016 01/06/2015  Falls in the past year? 0 No No Yes  Comment - - - fx (R) humerus; fx (R) femur  Number falls in past yr: 0 - - 2 or more  Injury with Fall? 0 - - -  Risk for fall due to : - - - History of fall(s)   Functional Status Survey:    Vitals:   12/21/19 1600  BP: (!) 145/89  Pulse: 77  Resp: 14  Temp: (!) 97.5 F (36.4 C)  TempSrc: Oral  SpO2: 95%  Weight: 141 lb 3.2 oz (64 kg)  Height: 5\' 2"  (1.575 m)   Body mass index is 25.83 kg/m. Physical Exam Vitals and nursing note reviewed.  Constitutional:      General: She is not in acute distress.    Appearance: Normal  appearance. She is not ill-appearing, toxic-appearing or diaphoretic.  HENT:     Head: Normocephalic and atraumatic.     Nose: Nose normal.     Mouth/Throat:     Mouth: Mucous membranes are moist.  Eyes:     Extraocular Movements: Extraocular movements intact.     Conjunctiva/sclera: Conjunctivae normal.     Pupils: Pupils are equal, round, and reactive to light.  Cardiovascular:     Rate and Rhythm: Normal rate and regular rhythm.     Heart sounds: No murmur.  Pulmonary:     Breath sounds: No wheezing, rhonchi or rales.  Chest:     Chest wall: No tenderness.  Abdominal:     General: Bowel sounds are normal. There is no distension.     Palpations: Abdomen is soft.     Tenderness: There is no abdominal tenderness. There is no right CVA tenderness, left CVA tenderness, guarding or rebound.  Musculoskeletal:     Cervical back: Normal range of motion and neck supple.     Right lower leg: No edema.     Left lower leg: No edema.  Skin:    General: Skin is warm and dry.  Neurological:     General: No focal deficit present.     Mental Status: She is alert. Mental status is at baseline.     Motor: No weakness.     Coordination: Coordination normal.     Gait: Gait abnormal.     Comments: Oriented to self. Slightly weakness in RLE  Psychiatric:        Mood and Affect: Mood normal.        Behavior: Behavior normal.     Labs reviewed: Recent Labs    03/05/19 1156 03/06/19 0542 03/07/19 0457 06/28/19 0000 09/20/19 0000  NA 144 142 141 143 144  K 3.8 3.9 3.8 4.2 4.2  CL 109 107 108 108 107  CO2 26 26 25 26 29   GLUCOSE 96 98 105*  --   --   BUN 17 17 20  24* 33*  CREATININE 0.92 1.03* 0.91 0.8 0.9  CALCIUM 9.4 9.4 9.3 8.9 9.2   Recent Labs    03/05/19 1156 03/06/19 0542 03/07/19 0457 06/28/19 0000 09/20/19 0000  AST 20 18 19  11* 12*  ALT 14 13 12 15 16   ALKPHOS 49 43 47 60 72  BILITOT 0.6 1.0 0.6  --   --   PROT 6.2* 5.7* 5.7* 5.4 5.9*  ALBUMIN 3.6 3.2* 3.2* 3.5  3.8   Recent Labs    03/05/19 1156 03/07/19 0457 06/28/19 0000 09/20/19 0000  WBC 9.2 8.9 7.7 7.5  NEUTROABS 5.2  --   --   --   HGB 13.0 12.5 11.4* 12.0  HCT 40.0 39.0 35* 38  MCV 99.5 96.8  --   --   PLT 209 193 202 233   Lab Results  Component Value Date   TSH 1.95 11/13/2018   Lab Results  Component Value Date   HGBA1C 5.9 (H) 03/06/2019   Lab Results  Component Value Date   CHOL 119 06/28/2019   HDL 38 06/28/2019   LDLCALC 65 06/28/2019   TRIG 78 06/28/2019   CHOLHDL 6.3 03/06/2019    Significant Diagnostic Results in last 30 days:   Assessment/Plan Fall Fall 12/20/19, mechanical fall, no apparent injury, lack of safety awareness and unsteady gait are contributory, close supervision/assistance for safety.   Dementia ([place]) Continue SNF [facility] for safety, care assistance, continue Memantine, Donepezil for memory, w/c for mobility.   Hypertension Blood pressure is controlled, continue Amlodipine.   Stroke (cerebrum) (HCC) Slightly weakness RLE,  continue Atorvastatin, Plavix.   Anxiety Her mood is stable, continue Sertraline 25mg  qd.   Gait abnormality Continue w/c for mobility. The patient needs assistance for transfer     Family/ staff Communication: plan of care reviewed with the patient and charge nurse.   Labs/tests ordered: none  Time spend 25 minutes.

## 2019-12-23 ENCOUNTER — Encounter: Payer: Self-pay | Admitting: Nurse Practitioner

## 2019-12-23 DIAGNOSIS — R269 Unspecified abnormalities of gait and mobility: Secondary | ICD-10-CM | POA: Insufficient documentation

## 2019-12-23 NOTE — Assessment & Plan Note (Signed)
Blood pressure is controlled, continue Amlodipine.  

## 2019-12-23 NOTE — Assessment & Plan Note (Signed)
Continue w/c for mobility. The patient needs assistance for transfer

## 2019-12-23 NOTE — Assessment & Plan Note (Signed)
Continue SNF [facility] for safety, care assistance, continue Memantine, Donepezil for memory, w/c for mobility.

## 2019-12-23 NOTE — Assessment & Plan Note (Signed)
Fall 12/20/19, mechanical fall, no apparent injury, lack of safety awareness and unsteady gait are contributory, close supervision/assistance for safety.

## 2019-12-23 NOTE — Assessment & Plan Note (Signed)
Her mood is stable, continue Sertraline 25mg qd.  

## 2019-12-23 NOTE — Assessment & Plan Note (Addendum)
Slightly weakness RLE,  continue Atorvastatin, Plavix.

## 2019-12-25 DIAGNOSIS — Z20828 Contact with and (suspected) exposure to other viral communicable diseases: Secondary | ICD-10-CM | POA: Diagnosis not present

## 2019-12-27 DIAGNOSIS — R41841 Cognitive communication deficit: Secondary | ICD-10-CM | POA: Diagnosis not present

## 2019-12-27 DIAGNOSIS — I69928 Other speech and language deficits following unspecified cerebrovascular disease: Secondary | ICD-10-CM | POA: Diagnosis not present

## 2019-12-27 DIAGNOSIS — Z8781 Personal history of (healed) traumatic fracture: Secondary | ICD-10-CM | POA: Diagnosis not present

## 2019-12-27 DIAGNOSIS — M6281 Muscle weakness (generalized): Secondary | ICD-10-CM | POA: Diagnosis not present

## 2019-12-27 DIAGNOSIS — R2681 Unsteadiness on feet: Secondary | ICD-10-CM | POA: Diagnosis not present

## 2019-12-27 DIAGNOSIS — R2689 Other abnormalities of gait and mobility: Secondary | ICD-10-CM | POA: Diagnosis not present

## 2019-12-27 DIAGNOSIS — M810 Age-related osteoporosis without current pathological fracture: Secondary | ICD-10-CM | POA: Diagnosis not present

## 2019-12-31 ENCOUNTER — Non-Acute Institutional Stay (SKILLED_NURSING_FACILITY): Payer: PPO | Admitting: Internal Medicine

## 2019-12-31 ENCOUNTER — Encounter: Payer: Self-pay | Admitting: Internal Medicine

## 2019-12-31 DIAGNOSIS — I6302 Cerebral infarction due to thrombosis of basilar artery: Secondary | ICD-10-CM | POA: Diagnosis not present

## 2019-12-31 DIAGNOSIS — F0390 Unspecified dementia without behavioral disturbance: Secondary | ICD-10-CM | POA: Diagnosis not present

## 2019-12-31 DIAGNOSIS — I10 Essential (primary) hypertension: Secondary | ICD-10-CM

## 2019-12-31 DIAGNOSIS — E7849 Other hyperlipidemia: Secondary | ICD-10-CM | POA: Diagnosis not present

## 2019-12-31 NOTE — Progress Notes (Signed)
Location:   [facility] Room Number: 18 Place of Service:  SNF [phone]) Provider:  [name], MD  [name], MD  Patient Care Team: [name], MD as PCP - General (Internal Medicine) Ngetich, Donalee Citrin, NP as Nurse Practitioner [facility] Medicine)  Extended Emergency Contact Information Primary Emergency Contact: Gordan,[place] Address: [address]          [place], [place] [address] Darden Amber of [place] Home Phone: [phone] Relation: Daughter Secondary Emergency Contact: Iven Finn Address: [address]          [place], [place] [address] [place] of [place] Home Phone: [phone] Relation: Spouse  Code Status:  DNR Goals of care: Advanced Directive information Advanced Directives 12/31/2019  Does Patient Have a Medical Advance Directive? Yes  Type of Estate agent of [place];Living will;Out of facility DNR (pink MOST or yellow form)  Does patient want to make changes to medical advance directive? No - Patient declined  Copy of Healthcare Power of Attorney in Chart? Yes - validated most recent copy scanned in chart (See row information)  Pre-existing out of facility DNR order (yellow form or pink MOST form) Yellow form placed in chart (order not valid for inpatient use)     Chief Complaint  Patient presents with  . Medical Management of Chronic Issues  . Health Maintenance    Dexa scan    HPI:  Pt is a 86 y.o. female seen today for medical management of chronic diseases.   Patient has history of dementia, anxiety,history of right humerus fracture, hypertension and hyperlipidemia. Patient was admitted in the hospitalfrom 03/23-03/25 for Acute CVAWith weakness in her Right Leg and Arm MRI showed Acute infarction on Left Para median Pons  Has recovered well since then, No New Complains. Stable per Nurses.Continues to gain weight.  Past Medical History:  Diagnosis Date  . Allergic rhinitis   . Anxiety    . Balance problem 04/28/2015  . Fatigue 04/28/2015  . Hearing loss 04/28/2015  . history of Fracture of right humerus 04/28/2015  . HLD (hyperlipidemia) 03/05/2019  . Hyperglycemia   . Hyperlipidemia   . Hypertension   . Memory loss 04/28/2015   04/26/2014 MMSE 28/30. Failed clock drawing. 04/21/15 MMSE 21/30. Failed clock drawing.   . Osteoporosis    Past Surgical History:  Procedure Laterality Date  . ABDOMINAL HYSTERECTOMY    . CATARACT EXTRACTION  2010  . ELBOW SURGERY Right 1988  . FEMUR FRACTURE SURGERY Right 06/2014   [place], [place] Dr. Dione Housekeeper  . HUMERUS FRACTURE SURGERY Right 2015   [place] Dr. Eber Hong  . TONSILLECTOMY  1935    Allergies  Allergen Reactions  . Ace Inhibitors Other (See Comments)    Unknown reaction  . Almond Oil Rash  . Plum Pulp Rash    Allergies as of 12/31/2019      Reactions   Ace Inhibitors Other (See Comments)   Unknown reaction   Almond Oil Rash   Plum Pulp Rash      Medication List       Accurate as of December 31, 2019 10:09 AM. If you have any questions, ask your nurse or doctor.        amLODipine 2.5 MG tablet Commonly known as: NORVASC Take 2.5 mg by mouth daily.   atorvastatin 40 MG tablet Commonly known as: LIPITOR Take 1 tablet (40 mg total) by mouth daily at 6 PM.   calcium carbonate 1500 (600  Ca) MG Tabs tablet Commonly known as: OSCAL Take 600 mg of elemental calcium by mouth daily with breakfast.   clopidogrel 75 MG tablet Commonly known as: PLAVIX Take 1 tablet (75 mg total) by mouth daily.   donepezil 10 MG tablet Commonly known as: ARICEPT TAKE ONE TABLET BY MOUTH DAILY TO PRESERVE MEMORY   Fish Oil 500 MG Caps Take 1 capsule by mouth daily.   memantine 10 MG tablet Commonly known as: Namenda Take 1 tablet (10 mg total) by mouth 2 (two) times daily.   pantoprazole 40 MG tablet Commonly known as: Protonix Take 1 tablet (40 mg total) by mouth daily.   sertraline 25 MG tablet Commonly  known as: ZOLOFT Take 25 mg by mouth daily.   Vitamin D3 25 MCG (1000 UT) Caps Take 1,000 Units daily by mouth.   zinc oxide 20 % ointment Apply 1 application topically as needed for irritation. Apply to peri/buttocks after every incontinent episode       Review of Systems  Review of Systems  Constitutional: Negative for activity change, appetite change, chills, diaphoresis, fatigue and fever.  HENT: Negative for mouth sores, postnasal drip, rhinorrhea, sinus pain and sore throat.   Respiratory: Negative for apnea, cough, chest tightness, shortness of breath and wheezing.   Cardiovascular: Negative for chest pain, palpitations and leg swelling.  Gastrointestinal: Negative for abdominal distention, abdominal pain, constipation, diarrhea, nausea and vomiting.  Genitourinary: Negative for dysuria and frequency.  Musculoskeletal: Negative for arthralgias, joint swelling and myalgias.  Skin: Negative for rash.  Neurological: Negative for dizziness, syncope, weakness, light-headedness and numbness.  Psychiatric/Behavioral: Negative for behavioral problems, confusion and sleep disturbance.     Immunization History  Administered Date(s) Administered  . DTaP 05/12/2014  . Influenza, High Dose Seasonal PF 09/21/2017  . Influenza,inj,Quad PF,6+ Mos 09/14/2018  . Influenza-Unspecified 10/07/2014, 09/11/2015, 09/23/2016  . PPD Test 10/16/2014  . Pneumococcal Conjugate-13 10/20/2017  . Pneumococcal Polysaccharide-23 08/20/2011   Pertinent  Health Maintenance Due  Topic Date Due  . DEXA SCAN  03/21/1993  . INFLUENZA VACCINE  Completed  . PNA vac Low Risk Adult  Completed   Fall Risk  11/21/2018 10/14/2017 02/17/2016 01/06/2015  Falls in the past year? 0 No No Yes  Comment - - - fx (R) humerus; fx (R) femur  Number falls in past yr: 0 - - 2 or more  Injury with Fall? 0 - - -  Risk for fall due to : - - - History of fall(s)   Functional Status Survey:    Vitals:   12/31/19 1005  BP:  (!) 160/88  Pulse: 61  Resp: 20  Temp: (!) 97.1 F (36.2 C)  SpO2: 95%  Weight: 141 lb 3.2 oz (64 kg)  Height: 5\' 2"  (1.575 m)   Body mass index is 25.83 kg/m. Physical Exam  Constitutional:. Well-developed and well-nourished.  HENT:  Head: Normocephalic.  Mouth/Throat: Oropharynx is clear and moist.  Eyes: Pupils are equal, round, and reactive to light.  Neck: Neck supple.  Cardiovascular: Normal rate and normal heart sounds.  No murmur heard. Pulmonary/Chest: Effort normal and breath sounds normal. No respiratory distress. No wheezes. She has no rales.  Abdominal: Soft. Bowel sounds are normal. No distension. There is no tenderness. There is no rebound.  Musculoskeletal: No edema.  Lymphadenopathy: none Neurological: No Focal Deficits  Skin: Skin is warm and dry.  Psychiatric: Normal mood and affect. Behavior is normal. Thought content normal.    Labs reviewed: Recent Labs  03/05/19 1156 03/05/19 1156 03/06/19 0542 03/06/19 0542 03/07/19 0457 06/28/19 0000 09/20/19 0000  NA 144   < > 142   < > 141 143 144  K 3.8   < > 3.9   < > 3.8 4.2 4.2  CL 109   < > 107   < > 108 108 107  CO2 26   < > 26   < > 25 26 29   GLUCOSE 96  --  98  --  105*  --   --   BUN 17   < > 17   < > 20 24* 33*  CREATININE 0.92   < > 1.03*   < > 0.91 0.8 0.9  CALCIUM 9.4   < > 9.4   < > 9.3 8.9 9.2   < > = values in this interval not displayed.   Recent Labs    03/05/19 1156 03/05/19 1156 03/06/19 0542 03/06/19 0542 03/07/19 0457 06/28/19 0000 09/20/19 0000  AST 20   < > 18   < > 19 11* 12*  ALT 14   < > 13   < > 12 15 16   ALKPHOS 49   < > 43   < > 47 60 72  BILITOT 0.6  --  1.0  --  0.6  --   --   PROT 6.2*   < > 5.7*   < > 5.7* 5.4 5.9*  ALBUMIN 3.6   < > 3.2*   < > 3.2* 3.5 3.8   < > = values in this interval not displayed.   Recent Labs    03/05/19 1156 03/05/19 1156 03/07/19 0457 06/28/19 0000 09/20/19 0000  WBC 9.2   < > 8.9 7.7 7.5  NEUTROABS 5.2  --   --   --   --    HGB 13.0   < > 12.5 11.4* 12.0  HCT 40.0   < > 39.0 35* 38  MCV 99.5  --  96.8  --   --   PLT 209   < > 193 202 233   < > = values in this interval not displayed.   Lab Results  Component Value Date   TSH 1.95 11/13/2018   Lab Results  Component Value Date   HGBA1C 5.9 (H) 03/06/2019   Lab Results  Component Value Date   CHOL 119 06/28/2019   HDL 38 06/28/2019   LDLCALC 65 06/28/2019   TRIG 78 06/28/2019   CHOLHDL 6.3 03/06/2019    Significant Diagnostic Results in last 30 days:  No results found.  Assessment/Plan  Essential hypertension BP elevated Will check it QD for 1 week and then Reval  Cerebrovascular accident (CVA) due to thrombosis of basilar artery (HCC) BP Control On Statin Also ON Plavix  Other hyperlipidemia Doing well on Lipitor  Dementia without behavioral disturbance, unspecified dementia type (HCC) Continue Supportive care On Namenda and Aricept Depression On Zoloft Not candidate for 06/30/2019 staff Communication:   Labs/tests ordered:    Total time spent in this patient care encounter was  25_  minutes; greater than 50% of the visit spent counseling patient and staff, reviewing records , Labs and coordinating care for problems addressed at this encounter.

## 2020-01-01 DIAGNOSIS — Z20828 Contact with and (suspected) exposure to other viral communicable diseases: Secondary | ICD-10-CM | POA: Diagnosis not present

## 2020-01-04 DIAGNOSIS — L84 Corns and callosities: Secondary | ICD-10-CM | POA: Diagnosis not present

## 2020-01-04 DIAGNOSIS — M79672 Pain in left foot: Secondary | ICD-10-CM | POA: Diagnosis not present

## 2020-01-04 DIAGNOSIS — M79671 Pain in right foot: Secondary | ICD-10-CM | POA: Diagnosis not present

## 2020-01-04 DIAGNOSIS — L602 Onychogryphosis: Secondary | ICD-10-CM | POA: Diagnosis not present

## 2020-01-08 DIAGNOSIS — Z20828 Contact with and (suspected) exposure to other viral communicable diseases: Secondary | ICD-10-CM | POA: Diagnosis not present

## 2020-01-14 ENCOUNTER — Encounter: Payer: Self-pay | Admitting: Internal Medicine

## 2020-01-14 ENCOUNTER — Non-Acute Institutional Stay (SKILLED_NURSING_FACILITY): Payer: PPO | Admitting: Internal Medicine

## 2020-01-14 DIAGNOSIS — I10 Essential (primary) hypertension: Secondary | ICD-10-CM | POA: Diagnosis not present

## 2020-01-14 DIAGNOSIS — F424 Excoriation (skin-picking) disorder: Secondary | ICD-10-CM | POA: Diagnosis not present

## 2020-01-14 DIAGNOSIS — I6302 Cerebral infarction due to thrombosis of basilar artery: Secondary | ICD-10-CM | POA: Diagnosis not present

## 2020-01-14 DIAGNOSIS — I69928 Other speech and language deficits following unspecified cerebrovascular disease: Secondary | ICD-10-CM | POA: Diagnosis not present

## 2020-01-14 DIAGNOSIS — I639 Cerebral infarction, unspecified: Secondary | ICD-10-CM | POA: Diagnosis not present

## 2020-01-14 DIAGNOSIS — M6281 Muscle weakness (generalized): Secondary | ICD-10-CM | POA: Diagnosis not present

## 2020-01-14 DIAGNOSIS — R2681 Unsteadiness on feet: Secondary | ICD-10-CM | POA: Diagnosis not present

## 2020-01-14 DIAGNOSIS — Z9181 History of falling: Secondary | ICD-10-CM | POA: Diagnosis not present

## 2020-01-14 DIAGNOSIS — R21 Rash and other nonspecific skin eruption: Secondary | ICD-10-CM | POA: Diagnosis not present

## 2020-01-14 DIAGNOSIS — R2689 Other abnormalities of gait and mobility: Secondary | ICD-10-CM | POA: Diagnosis not present

## 2020-01-14 DIAGNOSIS — F0390 Unspecified dementia without behavioral disturbance: Secondary | ICD-10-CM | POA: Diagnosis not present

## 2020-01-14 DIAGNOSIS — R41841 Cognitive communication deficit: Secondary | ICD-10-CM | POA: Diagnosis not present

## 2020-01-14 NOTE — Progress Notes (Signed)
Location:   [facility] Room Number: 18 Place of Service:  SNF [phone]) Provider:  [name], MD  [name], MD  Patient Care Team: [name], MD as PCP - General (Internal Medicine) Ngetich, Donalee Citrin, NP as Nurse Practitioner [facility] Medicine)  Extended Emergency Contact Information Primary Emergency Contact: Gordan,[place] Address: [address]          [place], [place] [address] [place] of [place] Home Phone: [phone] Relation: Daughter Secondary Emergency Contact: Iven Finn Address: [address]          [place], [place] [address] [place] Home Phone: [phone] Relation: Spouse  Code Status:  DNR Goals of care: Advanced Directive information Advanced Directives 01/14/2020  Does Patient Have a Medical Advance Directive? Yes  Type of Estate agent of [place];Living will;Out of facility DNR (pink MOST or yellow form)  Does patient want to make changes to medical advance directive? No - Patient declined  Copy of Healthcare Power of Attorney in Chart? Yes - validated most recent copy scanned in chart (See row information)  Pre-existing out of facility DNR order (yellow form or pink MOST form) Yellow form placed in chart (order not valid for inpatient use)     Chief Complaint  Patient presents with  . Acute Visit    Rash    HPI:  Pt is a 86 y.o. female seen today for an acute visit for Vesicular rash in Left Upper Arm. Patient has history of dementia, anxiety,history of right humerus fracture, hypertension and hyperlipidemia. Patient was admitted in the hospitalfrom 03/23-03/25 for Acute CVAWith weakness in her Right Leg and ArmMRI showed Acute infarction on Left Para median Pons  Unable to give any history Has these vesicular Rash in her left Upper Arm. It seems she has been Picking her skin there. Patient denies any itching or Pain. NO Swelling. NO Fever  Past Medical History:    Diagnosis Date  . Allergic rhinitis   . Anxiety   . Balance problem 04/28/2015  . Fatigue 04/28/2015  . Hearing loss 04/28/2015  . history of Fracture of right humerus 04/28/2015  . HLD (hyperlipidemia) 03/05/2019  . Hyperglycemia   . Hyperlipidemia   . Hypertension   . Memory loss 04/28/2015   04/26/2014 MMSE 28/30. Failed clock drawing. 04/21/15 MMSE 21/30. Failed clock drawing.   . Osteoporosis    Past Surgical History:  Procedure Laterality Date  . ABDOMINAL HYSTERECTOMY    . CATARACT EXTRACTION  2010  . ELBOW SURGERY Right 1988  . FEMUR FRACTURE SURGERY Right 06/2014   [place], [place] Dr. Dione Housekeeper  . HUMERUS FRACTURE SURGERY Right 2015   [place], [place] Dr. Eber Hong  . TONSILLECTOMY  1935    Allergies  Allergen Reactions  . Ace Inhibitors Other (See Comments)    Unknown reaction  . Almond Oil Rash  . Plum Pulp Rash    Allergies as of 01/14/2020      Reactions   Ace Inhibitors Other (See Comments)   Unknown reaction   Almond Oil Rash   Plum Pulp Rash      Medication List       Accurate as of January 14, 2020 11:49 AM. If you have any questions, ask your nurse or doctor.        amLODipine 2.5 MG tablet Commonly known as: NORVASC Take 2.5 mg by mouth daily.   atorvastatin 40 MG tablet Commonly known as: LIPITOR Take 1 tablet (40  mg total) by mouth daily at 6 PM.   calcium carbonate 1500 (600 Ca) MG Tabs tablet Commonly known as: OSCAL Take 600 mg of elemental calcium by mouth daily with breakfast.   clopidogrel 75 MG tablet Commonly known as: PLAVIX Take 1 tablet (75 mg total) by mouth daily.   donepezil 10 MG tablet Commonly known as: ARICEPT TAKE ONE TABLET BY MOUTH DAILY TO PRESERVE MEMORY   Fish Oil 500 MG Caps Take 1 capsule by mouth daily.   memantine 10 MG tablet Commonly known as: Namenda Take 1 tablet (10 mg total) by mouth 2 (two) times daily.   pantoprazole 40 MG tablet Commonly known as: Protonix Take 1 tablet (40 mg total) by  mouth daily.   sertraline 25 MG tablet Commonly known as: ZOLOFT Take 25 mg by mouth daily.   Vitamin D3 25 MCG (1000 UT) Caps Take 1,000 Units daily by mouth.   zinc oxide 20 % ointment Apply 1 application topically as needed for irritation. Apply to peri/buttocks after every incontinent episode       Review of Systems  Unable to perform ROS: Dementia    Immunization History  Administered Date(s) Administered  . DTaP 05/12/2014  . Influenza, High Dose Seasonal PF 09/21/2017  . Influenza,inj,Quad PF,6+ Mos 09/14/2018  . Influenza-Unspecified 10/07/2014, 09/11/2015, 09/23/2016  . PPD Test 10/16/2014  . Pneumococcal Conjugate-13 10/20/2017  . Pneumococcal Polysaccharide-23 08/20/2011   Pertinent  Health Maintenance Due  Topic Date Due  . DEXA SCAN  03/21/1993  . INFLUENZA VACCINE  Completed  . PNA vac Low Risk Adult  Completed   Fall Risk  11/21/2018 10/14/2017 02/17/2016 01/06/2015  Falls in the past year? 0 No No Yes  Comment - - - fx (R) humerus; fx (R) femur  Number falls in past yr: 0 - - 2 or more  Injury with Fall? 0 - - -  Risk for fall due to : - - - History of fall(s)   Functional Status Survey:    Vitals:   01/14/20 1145  BP: 127/85  Pulse: 70  Resp: 20  Temp: (!) 97.4 F (36.3 C)  SpO2: 95%  Weight: 141 lb 3.2 oz (64 kg)  Height: 5\' 2"  (1.575 m)   Body mass index is 25.83 kg/m. Physical Exam Constitutional:      Appearance: Normal appearance.  HENT:     Head: Normocephalic.     Nose: Nose normal.     Mouth/Throat:     Mouth: Mucous membranes are moist.     Pharynx: Oropharynx is clear.  Eyes:     Pupils: Pupils are equal, round, and reactive to light.  Cardiovascular:     Rate and Rhythm: Normal rate.     Pulses: Normal pulses.  Pulmonary:     Effort: Pulmonary effort is normal.     Breath sounds: Normal breath sounds.  Abdominal:     General: Abdomen is flat. Bowel sounds are normal.     Palpations: Abdomen is soft.    Musculoskeletal:     Cervical back: Normal range of motion and neck supple.  Skin:    General: Skin is warm and dry.     Comments: Has Vesicular rash in Left Upper Arm looks like she has picking on her Slkin  Neurological:     General: No focal deficit present.     Mental Status: She is alert.     Labs reviewed: Recent Labs    03/05/19 1156 03/05/19 1156 03/06/19 0542 03/06/19  6213 03/07/19 0457 06/28/19 0000 09/20/19 0000  NA 144   < > 142   < > 141 143 144  K 3.8   < > 3.9   < > 3.8 4.2 4.2  CL 109   < > 107   < > 108 108 107  CO2 26   < > 26   < > 25 26 29   GLUCOSE 96  --  98  --  105*  --   --   BUN 17   < > 17   < > 20 24* 33*  CREATININE 0.92   < > 1.03*   < > 0.91 0.8 0.9  CALCIUM 9.4   < > 9.4   < > 9.3 8.9 9.2   < > = values in this interval not displayed.   Recent Labs    03/05/19 1156 03/05/19 1156 03/06/19 0542 03/06/19 0542 03/07/19 0457 06/28/19 0000 09/20/19 0000  AST 20   < > 18   < > 19 11* 12*  ALT 14   < > 13   < > 12 15 16   ALKPHOS 49   < > 43   < > 47 60 72  BILITOT 0.6  --  1.0  --  0.6  --   --   PROT 6.2*   < > 5.7*   < > 5.7* 5.4 5.9*  ALBUMIN 3.6   < > 3.2*   < > 3.2* 3.5 3.8   < > = values in this interval not displayed.   Recent Labs    03/05/19 1156 03/05/19 1156 03/07/19 0457 06/28/19 0000 09/20/19 0000  WBC 9.2   < > 8.9 7.7 7.5  NEUTROABS 5.2  --   --   --   --   HGB 13.0   < > 12.5 11.4* 12.0  HCT 40.0   < > 39.0 35* 38  MCV 99.5  --  96.8  --   --   PLT 209   < > 193 202 233   < > = values in this interval not displayed.   Lab Results  Component Value Date   TSH 1.95 11/13/2018   Lab Results  Component Value Date   HGBA1C 5.9 (H) 03/06/2019   Lab Results  Component Value Date   CHOL 119 06/28/2019   HDL 38 06/28/2019   LDLCALC 65 06/28/2019   TRIG 78 06/28/2019   CHOLHDL 6.3 03/06/2019    Significant Diagnostic Results in last 30 days:  No results found.  Assessment/Plan  Rash and nonspecific skin  eruption Triamcinolone cream BID for 2 weeks Does not look Infected  Skin picking habit Does look like she was picking on her skin She denies it Will continue to monitor  Other Issues  Essential hypertension BP seems well controlled right now   Cerebrovascular accident (CVA) due to thrombosis of basilar artery ([place]) ON Statin Plavix Dementia without behavioral disturbance, unspecified dementia type ([place]) Aricept and Namenda Depression On Zoloft Not candidate for [employer] staff Communication:   Labs/tests ordered:   Total time spent in this patient care encounter was  _25  minutes; greater than 50% of the visit spent counseling patient and staff, reviewing records , Labs and coordinating care for problems addressed at this encounter.

## 2020-01-15 DIAGNOSIS — Z20828 Contact with and (suspected) exposure to other viral communicable diseases: Secondary | ICD-10-CM | POA: Diagnosis not present

## 2020-01-24 DIAGNOSIS — Z20828 Contact with and (suspected) exposure to other viral communicable diseases: Secondary | ICD-10-CM | POA: Diagnosis not present

## 2020-01-29 DIAGNOSIS — Z20828 Contact with and (suspected) exposure to other viral communicable diseases: Secondary | ICD-10-CM | POA: Diagnosis not present

## 2020-02-05 DIAGNOSIS — Z20828 Contact with and (suspected) exposure to other viral communicable diseases: Secondary | ICD-10-CM | POA: Diagnosis not present

## 2020-02-08 ENCOUNTER — Non-Acute Institutional Stay (SKILLED_NURSING_FACILITY): Payer: PPO | Admitting: Nurse Practitioner

## 2020-02-08 ENCOUNTER — Encounter: Payer: Self-pay | Admitting: Nurse Practitioner

## 2020-02-08 DIAGNOSIS — R269 Unspecified abnormalities of gait and mobility: Secondary | ICD-10-CM | POA: Diagnosis not present

## 2020-02-08 DIAGNOSIS — I10 Essential (primary) hypertension: Secondary | ICD-10-CM

## 2020-02-08 DIAGNOSIS — F0150 Vascular dementia without behavioral disturbance: Secondary | ICD-10-CM

## 2020-02-08 DIAGNOSIS — M810 Age-related osteoporosis without current pathological fracture: Secondary | ICD-10-CM | POA: Diagnosis not present

## 2020-02-08 DIAGNOSIS — K219 Gastro-esophageal reflux disease without esophagitis: Secondary | ICD-10-CM | POA: Diagnosis not present

## 2020-02-08 DIAGNOSIS — F419 Anxiety disorder, unspecified: Secondary | ICD-10-CM

## 2020-02-08 NOTE — Assessment & Plan Note (Signed)
W/c for mobility °

## 2020-02-08 NOTE — Assessment & Plan Note (Signed)
10/09/19 Dr. Chales Abrahams advised no DEXA R>B at age of 29 in setting of advanced dementia. Continue Vit D, Ca

## 2020-02-08 NOTE — Assessment & Plan Note (Signed)
Blood pressure is controlled, continue Amlodipine.  

## 2020-02-08 NOTE — Assessment & Plan Note (Signed)
Her mood is stable, continue Sertraline.  

## 2020-02-08 NOTE — Assessment & Plan Note (Signed)
Stable, continue Pantoprazole.  

## 2020-02-08 NOTE — Assessment & Plan Note (Signed)
Hx of CVA, on Statin, Plavix. Continue SNF [facility] for safety, care assistance, continue Memantine, Donepezil for memory.

## 2020-02-08 NOTE — Progress Notes (Signed)
Location:   [facility] Room Number: 18 Place of Service:  SNF (31) Provider:  Lamond Glantz NP  [name], MD  Patient Care Team: [name], MD as PCP - General (Internal Medicine) Ngetich, Donalee Citrin, NP as Nurse Practitioner [facility] Medicine)  Extended Emergency Contact Information Primary Emergency Contact: Gordan,[place] Address: [address]          [place], [place] [address] [place] of [place] Home Phone: [phone] Relation: Daughter Secondary Emergency Contact: Iven Finn Address: [address]          [place], [place] [address] [place] Home Phone: [phone] Relation: Spouse  Code Status:  DNR Goals of care: Advanced Directive information Advanced Directives 02/08/2020  Does Patient Have a Medical Advance Directive? Yes  Type of Advance Directive Out of facility DNR (pink MOST or yellow form);Living will;Healthcare Power of Attorney  Does patient want to make changes to medical advance directive? No - Patient declined  Copy of Healthcare Power of Attorney in Chart? Yes - validated most recent copy scanned in chart (See row information)  Pre-existing out of facility DNR order (yellow form or pink MOST form) Yellow form placed in chart (order not valid for inpatient use)     Chief Complaint  Patient presents with  . Medical Management of Chronic Issues  . Health Maintenance    Dexa scan    HPI:  Pt is a 86 y.o. female seen today for medical management of chronic diseases.    The patient resides in SNF [facility] for safety, care assistance, w/c for mobility, on Donepezil, Memantine for memory, her mood is stable on Sertraline 25mg  qd. Hx of HTN, blood pressure is controlled, on Amlodipine 2.5mg  qd. GERD, stable, on Pantoprazole 40mg  qd.   Past Medical History:  Diagnosis Date  . Allergic rhinitis   . Anxiety   . Balance problem 04/28/2015  . Fatigue 04/28/2015  . Hearing loss 04/28/2015  . history of Fracture of right  humerus 04/28/2015  . HLD (hyperlipidemia) 03/05/2019  . Hyperglycemia   . Hyperlipidemia   . Hypertension   . Memory loss 04/28/2015   04/26/2014 MMSE 28/30. Failed clock drawing. 04/21/15 MMSE 21/30. Failed clock drawing.   . Osteoporosis    Past Surgical History:  Procedure Laterality Date  . ABDOMINAL HYSTERECTOMY    . CATARACT EXTRACTION  2010  . ELBOW SURGERY Right 1988  . FEMUR FRACTURE SURGERY Right 06/2014   [place] Dr. 07/2014  . HUMERUS FRACTURE SURGERY Right 2015   [place]  . TONSILLECTOMY  1935    Allergies  Allergen Reactions  . Ace Inhibitors Other (See Comments)    Unknown reaction  . Almond Oil Rash  . Plum Pulp Rash    Allergies as of 02/08/2020      Reactions   Ace Inhibitors Other (See Comments)   Unknown reaction   Almond Oil Rash   Plum Pulp Rash      Medication List       Accurate as of February 08, 2020  4:00 PM. If you have any questions, ask your nurse or doctor.        amLODipine 2.5 MG tablet Commonly known as: NORVASC Take 2.5 mg by mouth daily.   atorvastatin 40 MG tablet Commonly known as: LIPITOR Take 1 tablet (40 mg total) by mouth daily at 6 PM.   calcium carbonate 1500 (600 Ca) MG Tabs tablet Commonly known as: OSCAL Take  600 mg of elemental calcium by mouth daily with breakfast.   clopidogrel 75 MG tablet Commonly known as: PLAVIX Take 1 tablet (75 mg total) by mouth daily.   donepezil 10 MG tablet Commonly known as: ARICEPT TAKE ONE TABLET BY MOUTH DAILY TO PRESERVE MEMORY   Fish Oil 500 MG Caps Take 1 capsule by mouth daily.   memantine 10 MG tablet Commonly known as: Namenda Take 1 tablet (10 mg total) by mouth 2 (two) times daily.   pantoprazole 40 MG tablet Commonly known as: Protonix Take 1 tablet (40 mg total) by mouth daily.   sertraline 25 MG tablet Commonly known as: ZOLOFT Take 25 mg by mouth daily.   Vitamin D3 25 MCG (1000 UT) Caps Take 1,000 Units daily by  mouth.   zinc oxide 20 % ointment Apply 1 application topically as needed for irritation. Apply to peri/buttocks after every incontinent episode      ROS was provided with assistance of staff.  Review of Systems  Constitutional: Negative for activity change, appetite change, chills, diaphoresis, fatigue, fever and unexpected weight change.  HENT: Positive for hearing loss. Negative for congestion and voice change.   Eyes: Negative for visual disturbance.  Respiratory: Negative for cough, shortness of breath and wheezing.   Cardiovascular: Negative for palpitations and leg swelling.  Gastrointestinal: Negative for abdominal distention, abdominal pain, constipation, nausea and vomiting.  Genitourinary: Negative for difficulty urinating, dysuria and urgency.  Musculoskeletal: Positive for arthralgias and gait problem.  Skin: Negative for color change.  Neurological: Positive for weakness. Negative for dizziness, facial asymmetry, speech difficulty and headaches.       Dementia.   Psychiatric/Behavioral: Positive for confusion. Negative for agitation, behavioral problems and sleep disturbance. The patient is not nervous/anxious.     Immunization History  Administered Date(s) Administered  . DTaP 05/12/2014  . Influenza, High Dose Seasonal PF 09/21/2017, 09/27/2019  . Influenza,inj,Quad PF,6+ Mos 09/14/2018  . Influenza-Unspecified 10/07/2014, 09/11/2015, 09/23/2016  . Moderna SARS-COVID-2 Vaccination 12/17/2019, 01/14/2020  . PPD Test 10/16/2014  . Pneumococcal Conjugate-13 10/20/2017  . Pneumococcal Polysaccharide-23 08/20/2011   Pertinent  Health Maintenance Due  Topic Date Due  . DEXA SCAN  03/21/1993  . INFLUENZA VACCINE  Completed  . PNA vac Low Risk Adult  Completed   Fall Risk  11/21/2018 10/14/2017 02/17/2016 01/06/2015  Falls in the past year? 0 No No Yes  Comment - - - fx (R) humerus; fx (R) femur  Number falls in past yr: 0 - - 2 or more  Injury with Fall? 0 - - -    Risk for fall due to : - - - History of fall(s)   Functional Status Survey:    Vitals:   02/08/20 0955  BP: 127/61  Pulse: 66  Resp: 18  Temp: (!) 97.1 F (36.2 C)  SpO2: 94%  Weight: 141 lb 8 oz (64.2 kg)  Height: 5\' 2"  (1.575 m)   Body mass index is 25.88 kg/m. Physical Exam Vitals and nursing note reviewed.  Constitutional:      General: She is not in acute distress.    Appearance: Normal appearance. She is not ill-appearing.  HENT:     Head: Normocephalic and atraumatic.     Nose: Nose normal.     Mouth/Throat:     Mouth: Mucous membranes are moist.  Eyes:     Extraocular Movements: Extraocular movements intact.     Conjunctiva/sclera: Conjunctivae normal.     Pupils: Pupils are equal, round, and reactive  to light.  Cardiovascular:     Rate and Rhythm: Normal rate and regular rhythm.     Heart sounds: No murmur.  Pulmonary:     Breath sounds: No wheezing or rales.  Abdominal:     General: Bowel sounds are normal. There is no distension.     Palpations: Abdomen is soft.     Tenderness: There is no abdominal tenderness. There is no guarding or rebound.  Musculoskeletal:     Cervical back: Normal range of motion and neck supple.     Right lower leg: No edema.     Left lower leg: No edema.  Skin:    General: Skin is warm and dry.  Neurological:     General: No focal deficit present.     Mental Status: She is alert. Mental status is at baseline.     Motor: No weakness.     Coordination: Coordination normal.     Gait: Gait abnormal.     Comments: Oriented to self. Slightly weakness in RLE  Psychiatric:        Mood and Affect: Mood normal.        Behavior: Behavior normal.     Labs reviewed: Recent Labs    03/05/19 1156 03/05/19 1156 03/06/19 0542 03/06/19 0542 03/07/19 0457 06/28/19 0000 09/20/19 0000  NA 144   < > 142   < > 141 143 144  K 3.8   < > 3.9   < > 3.8 4.2 4.2  CL 109   < > 107   < > 108 108 107  CO2 26   < > 26   < > 25 26 29    GLUCOSE 96  --  98  --  105*  --   --   BUN 17   < > 17   < > 20 24* 33*  CREATININE 0.92   < > 1.03*   < > 0.91 0.8 0.9  CALCIUM 9.4   < > 9.4   < > 9.3 8.9 9.2   < > = values in this interval not displayed.   Recent Labs    03/05/19 1156 03/05/19 1156 03/06/19 0542 03/06/19 0542 03/07/19 0457 06/28/19 0000 09/20/19 0000  AST 20   < > 18   < > 19 11* 12*  ALT 14   < > 13   < > 12 15 16   ALKPHOS 49   < > 43   < > 47 60 72  BILITOT 0.6  --  1.0  --  0.6  --   --   PROT 6.2*   < > 5.7*   < > 5.7* 5.4 5.9*  ALBUMIN 3.6   < > 3.2*   < > 3.2* 3.5 3.8   < > = values in this interval not displayed.   Recent Labs    03/05/19 1156 03/05/19 1156 03/07/19 0457 06/28/19 0000 09/20/19 0000  WBC 9.2   < > 8.9 7.7 7.5  NEUTROABS 5.2  --   --   --   --   HGB 13.0   < > 12.5 11.4* 12.0  HCT 40.0   < > 39.0 35* 38  MCV 99.5  --  96.8  --   --   PLT 209   < > 193 202 233   < > = values in this interval not displayed.   Lab Results  Component Value Date   TSH 1.95 11/13/2018   Lab Results  Component Value  Date   HGBA1C 5.9 (H) 03/06/2019   Lab Results  Component Value Date   CHOL 119 06/28/2019   HDL 38 06/28/2019   LDLCALC 65 06/28/2019   TRIG 78 06/28/2019   CHOLHDL 6.3 03/06/2019    Significant Diagnostic Results in last 30 days:  No results found.  Assessment/Plan Hypertension Blood pressure is controlled, continue Amlodipine.   GERD (gastroesophageal reflux disease) Stable, continue Pantoprazole.   Vascular dementia (HCC) Hx of CVA, on Statin, Plavix. Continue SNF [facility] for safety, care assistance, continue Memantine, Donepezil for memory.   Osteoporosis 10/09/19 Dr. Chales Abrahams advised no DEXA R>B at age of 67 in setting of advanced dementia. Continue Vit D, Ca  Anxiety Her mood is stable, continue Sertraline.   Gait abnormality W/c for mobility.      Family/ staff Communication: plan of care reviewed with the patient and charge nurse.   Labs/tests  ordered:  none  Time spend 25 minutes.

## 2020-02-12 DIAGNOSIS — F0390 Unspecified dementia without behavioral disturbance: Secondary | ICD-10-CM | POA: Diagnosis not present

## 2020-02-12 DIAGNOSIS — Z20828 Contact with and (suspected) exposure to other viral communicable diseases: Secondary | ICD-10-CM | POA: Diagnosis not present

## 2020-02-12 DIAGNOSIS — M6281 Muscle weakness (generalized): Secondary | ICD-10-CM | POA: Diagnosis not present

## 2020-02-12 DIAGNOSIS — Z8781 Personal history of (healed) traumatic fracture: Secondary | ICD-10-CM | POA: Diagnosis not present

## 2020-02-12 DIAGNOSIS — R2689 Other abnormalities of gait and mobility: Secondary | ICD-10-CM | POA: Diagnosis not present

## 2020-02-12 DIAGNOSIS — I639 Cerebral infarction, unspecified: Secondary | ICD-10-CM | POA: Diagnosis not present

## 2020-02-12 DIAGNOSIS — R2681 Unsteadiness on feet: Secondary | ICD-10-CM | POA: Diagnosis not present

## 2020-02-29 ENCOUNTER — Encounter: Payer: Self-pay | Admitting: Nurse Practitioner

## 2020-02-29 ENCOUNTER — Non-Acute Institutional Stay (SKILLED_NURSING_FACILITY): Payer: PPO | Admitting: Nurse Practitioner

## 2020-02-29 DIAGNOSIS — K219 Gastro-esophageal reflux disease without esophagitis: Secondary | ICD-10-CM

## 2020-02-29 DIAGNOSIS — F0150 Vascular dementia without behavioral disturbance: Secondary | ICD-10-CM

## 2020-02-29 DIAGNOSIS — M810 Age-related osteoporosis without current pathological fracture: Secondary | ICD-10-CM

## 2020-02-29 DIAGNOSIS — I10 Essential (primary) hypertension: Secondary | ICD-10-CM

## 2020-02-29 DIAGNOSIS — F419 Anxiety disorder, unspecified: Secondary | ICD-10-CM | POA: Diagnosis not present

## 2020-02-29 NOTE — Assessment & Plan Note (Signed)
Her mood is stable, continue Sertraline.  

## 2020-02-29 NOTE — Assessment & Plan Note (Signed)
Continue SNF [facility] for safety, care assistance, continue Donepezil, Memantine.

## 2020-02-29 NOTE — Progress Notes (Signed)
Location:   [facility]  Nursing Home Room Number: 18/A Place of Service:  SNF (31) Provider:  Haniel Fix, NP  [name], MD  Patient Care Team: [name], MD as PCP - General (Internal Medicine) Ngetich, Donalee Citrin, NP as Nurse Practitioner [facility])  Extended Emergency Contact Information Primary Emergency Contact: Gordan,[place] Address: [address]          [place], [place] [address] [place] of [place] Home Phone: [phone] Relation: Daughter Secondary Emergency Contact: Iven Finn Address: [address]          [place], [place] [address] [place] of [place] Home Phone: [phone] Relation: Spouse  Code Status:  DNR Goals of care: Advanced Directive information Advanced Directives 02/29/2020  Does Patient Have a Medical Advance Directive? Yes  Type of Estate agent of [place];Out of facility DNR (pink MOST or yellow form)  Does patient want to make changes to medical advance directive? No - Patient declined  Copy of Healthcare Power of Attorney in Chart? Yes - validated most recent copy scanned in chart (See row information)  Pre-existing out of facility DNR order (yellow form or pink MOST form) Yellow form placed in chart (order not valid for inpatient use)     Chief Complaint  Patient presents with  . Medical Management of Chronic Issues    Routine Visit   . Health Maintenance    Discuss the need for Dexa Scan.    HPI:  Pt is a 86 y.o. female seen today for medical management of chronic diseases.    The patient resides in SNF [facility] for safety, care assistance, on Donepezil 10mg  qd, Memantine 10mg  bid for memory, her mood is stable, on Sertraline 25mg  qd. Hx of HTN, blood pressure is controlled on Amlodipine 2.5mg  qd, on Atorvastatin 40mg  qd, Plavix 75mg  qd. GERD, stable, on Pantoprazole 40mg  qd.    Past Medical History:  Diagnosis Date  . Allergic rhinitis   . Anxiety   . Balance problem 04/28/2015  .  Fatigue 04/28/2015  . Hearing loss 04/28/2015  . history of Fracture of right humerus 04/28/2015  . HLD (hyperlipidemia) 03/05/2019  . Hyperglycemia   . Hyperlipidemia   . Hypertension   . Memory loss 04/28/2015   04/26/2014 MMSE 28/30. Failed clock drawing. 04/21/15 MMSE 21/30. Failed clock drawing.   . Osteoporosis    Past Surgical History:  Procedure Laterality Date  . ABDOMINAL HYSTERECTOMY    . CATARACT EXTRACTION  2010  . ELBOW SURGERY Right 1988  . FEMUR FRACTURE SURGERY Right 06/2014   [place] Dr. 04/30/2015  . HUMERUS FRACTURE SURGERY Right 2015   [place] Dr. 04/28/2014  . TONSILLECTOMY  1935    Allergies  Allergen Reactions  . Ace Inhibitors Other (See Comments)    Unknown reaction  . Almond Oil Rash  . Plum Pulp Rash    Allergies as of 02/29/2020      Reactions   Ace Inhibitors Other (See Comments)   Unknown reaction   Almond Oil Rash   Plum Pulp Rash      Medication List       Accurate as of February 29, 2020 11:59 PM. If you have any questions, ask your nurse or doctor.        amLODipine 2.5 MG tablet Commonly known as: NORVASC Take 2.5 mg by mouth daily.   atorvastatin 40 MG tablet Commonly known as: LIPITOR Take 1 tablet (40 mg total) by mouth daily  at 6 PM.   calcium carbonate 1500 (600 Ca) MG Tabs tablet Commonly known as: OSCAL Take 600 mg of elemental calcium by mouth daily with breakfast.   clopidogrel 75 MG tablet Commonly known as: PLAVIX Take 1 tablet (75 mg total) by mouth daily.   donepezil 10 MG tablet Commonly known as: ARICEPT TAKE ONE TABLET BY MOUTH DAILY TO PRESERVE MEMORY   Fish Oil 500 MG Caps Take 1 capsule by mouth daily.   memantine 10 MG tablet Commonly known as: Namenda Take 1 tablet (10 mg total) by mouth 2 (two) times daily.   pantoprazole 40 MG tablet Commonly known as: Protonix Take 1 tablet (40 mg total) by mouth daily.   PREP PROTECTIVE SKIN BARRIER EX Apply topically. Apply skin prep to  heels twice a day   sertraline 25 MG tablet Commonly known as: ZOLOFT Take 25 mg by mouth daily.   Vitamin D3 25 MCG (1000 UT) Caps Take 1,000 Units daily by mouth.   zinc oxide 20 % ointment Apply 1 application topically as needed for irritation. Apply to peri/buttocks after every incontinent episode      ROS was provided with assistance of staff.  Review of Systems  Constitutional: Negative for activity change, appetite change, fatigue, fever and unexpected weight change.  HENT: Positive for hearing loss. Negative for congestion and voice change.   Eyes: Negative for visual disturbance.  Respiratory: Negative for cough and shortness of breath.   Cardiovascular: Negative for palpitations and leg swelling.  Gastrointestinal: Negative for abdominal distention, abdominal pain and constipation.  Genitourinary: Negative for difficulty urinating, dysuria and urgency.  Musculoskeletal: Positive for arthralgias and gait problem.  Skin: Negative for color change.  Neurological: Positive for weakness. Negative for dizziness, facial asymmetry and speech difficulty.       Dementia.   Psychiatric/Behavioral: Positive for confusion. Negative for agitation and sleep disturbance. The patient is not nervous/anxious.     Immunization History  Administered Date(s) Administered  . DTaP 05/12/2014  . Influenza, High Dose Seasonal PF 09/21/2017, 09/27/2019  . Influenza,inj,Quad PF,6+ Mos 09/14/2018  . Influenza-Unspecified 10/07/2014, 09/11/2015, 09/23/2016  . Moderna SARS-COVID-2 Vaccination 12/17/2019, 01/14/2020  . PPD Test 10/16/2014  . Pneumococcal Conjugate-13 10/20/2017  . Pneumococcal Polysaccharide-23 08/20/2011   Pertinent  Health Maintenance Due  Topic Date Due  . DEXA SCAN  Never done  . INFLUENZA VACCINE  Completed  . PNA vac Low Risk Adult  Completed   Fall Risk  11/21/2018 10/14/2017 02/17/2016 01/06/2015  Falls in the past year? 0 No No Yes  Comment - - - fx (R) humerus; fx  (R) femur  Number falls in past yr: 0 - - 2 or more  Injury with Fall? 0 - - -  Risk for fall due to : - - - History of fall(s)   Functional Status Survey:    Vitals:   02/29/20 1110  BP: 123/61  Pulse: 60  Resp: 16  Temp: 97.6 F (36.4 C)  SpO2: 96%  Weight: 141 lb 8 oz (64.2 kg)  Height: 5\' 2"  (1.575 m)   Body mass index is 25.88 kg/m. Physical Exam Vitals and nursing note reviewed.  Constitutional:      General: She is not in acute distress.    Appearance: Normal appearance. She is not ill-appearing, toxic-appearing or diaphoretic.  HENT:     Head: Normocephalic and atraumatic.     Mouth/Throat:     Mouth: Mucous membranes are moist.  Eyes:     Extraocular  Movements: Extraocular movements intact.     Conjunctiva/sclera: Conjunctivae normal.     Pupils: Pupils are equal, round, and reactive to light.  Cardiovascular:     Rate and Rhythm: Normal rate and regular rhythm.     Heart sounds: No murmur.  Pulmonary:     Breath sounds: No wheezing or rales.  Abdominal:     General: Bowel sounds are normal. There is no distension.     Palpations: Abdomen is soft.     Tenderness: There is no abdominal tenderness.  Musculoskeletal:     Cervical back: Normal range of motion and neck supple.     Right lower leg: No edema.     Left lower leg: No edema.  Skin:    General: Skin is warm and dry.  Neurological:     General: No focal deficit present.     Mental Status: She is alert. Mental status is at baseline.     Motor: No weakness.     Coordination: Coordination normal.     Gait: Gait abnormal.     Comments: Oriented to self. Slightly weakness in RLE  Psychiatric:        Mood and Affect: Mood normal.        Behavior: Behavior normal.     Labs reviewed: Recent Labs    03/05/19 1156 03/05/19 1156 03/06/19 0542 03/06/19 0542 03/07/19 0457 06/28/19 0000 09/20/19 0000  NA 144   < > 142   < > 141 143 144  K 3.8   < > 3.9   < > 3.8 4.2 4.2  CL 109   < > 107   < >  108 108 107  CO2 26   < > 26   < > 25 26 29   GLUCOSE 96  --  98  --  105*  --   --   BUN 17   < > 17   < > 20 24* 33*  CREATININE 0.92   < > 1.03*   < > 0.91 0.8 0.9  CALCIUM 9.4   < > 9.4   < > 9.3 8.9 9.2   < > = values in this interval not displayed.   Recent Labs    03/05/19 1156 03/05/19 1156 03/06/19 0542 03/06/19 0542 03/07/19 0457 06/28/19 0000 09/20/19 0000  AST 20   < > 18   < > 19 11* 12*  ALT 14   < > 13   < > 12 15 16   ALKPHOS 49   < > 43   < > 47 60 72  BILITOT 0.6  --  1.0  --  0.6  --   --   PROT 6.2*   < > 5.7*   < > 5.7* 5.4 5.9*  ALBUMIN 3.6   < > 3.2*   < > 3.2* 3.5 3.8   < > = values in this interval not displayed.   Recent Labs    03/05/19 1156 03/05/19 1156 03/07/19 0457 06/28/19 0000 09/20/19 0000  WBC 9.2   < > 8.9 7.7 7.5  NEUTROABS 5.2  --   --   --   --   HGB 13.0   < > 12.5 11.4* 12.0  HCT 40.0   < > 39.0 35* 38  MCV 99.5  --  96.8  --   --   PLT 209   < > 193 202 233   < > = values in this interval not displayed.   Lab Results  Component Value Date   TSH 1.95 11/13/2018   Lab Results  Component Value Date   HGBA1C 5.9 (H) 03/06/2019   Lab Results  Component Value Date   CHOL 119 06/28/2019   HDL 38 06/28/2019   LDLCALC 65 06/28/2019   TRIG 78 06/28/2019   CHOLHDL 6.3 03/06/2019    Significant Diagnostic Results in last 30 days:  No results found.  Assessment/Plan Hypertension Blood pressure is controlled, continue Amlodipine,  Atorvastatin, Plavix, Hx of stroke.   GERD (gastroesophageal reflux disease) Stable, continue Pantoprazole.   Vascular dementia ([place]) Continue SNF [facility] for safety, care assistance, continue Donepezil, Memantine.   Anxiety Her mood is stable, continue Sertraline.   Osteoporosis Aged out.      Family/ staff Communication: plan of care reviewed with the patient and charge nurse.   Labs/tests ordered:  none  Time spend 25 minutes.

## 2020-02-29 NOTE — Assessment & Plan Note (Signed)
Stable, continue Pantoprazole.  

## 2020-02-29 NOTE — Assessment & Plan Note (Addendum)
Blood pressure is controlled, continue Amlodipine,  Atorvastatin, Plavix, Hx of stroke.

## 2020-02-29 NOTE — Assessment & Plan Note (Signed)
Aged out.

## 2020-03-31 ENCOUNTER — Non-Acute Institutional Stay (SKILLED_NURSING_FACILITY): Payer: PPO | Admitting: Internal Medicine

## 2020-03-31 ENCOUNTER — Encounter: Payer: Self-pay | Admitting: Internal Medicine

## 2020-03-31 DIAGNOSIS — F424 Excoriation (skin-picking) disorder: Secondary | ICD-10-CM | POA: Diagnosis not present

## 2020-03-31 DIAGNOSIS — R21 Rash and other nonspecific skin eruption: Secondary | ICD-10-CM | POA: Diagnosis not present

## 2020-03-31 DIAGNOSIS — K219 Gastro-esophageal reflux disease without esophagitis: Secondary | ICD-10-CM

## 2020-03-31 DIAGNOSIS — F0150 Vascular dementia without behavioral disturbance: Secondary | ICD-10-CM

## 2020-03-31 DIAGNOSIS — I10 Essential (primary) hypertension: Secondary | ICD-10-CM | POA: Diagnosis not present

## 2020-03-31 DIAGNOSIS — M810 Age-related osteoporosis without current pathological fracture: Secondary | ICD-10-CM

## 2020-03-31 NOTE — Progress Notes (Signed)
Location:  [facility] Room Number: 18 Place of Service:  SNF [phone])  Provider: Einar Crow MD  Code Status: DNR Goals of Care:  Advanced Directives 03/31/2020  Does Patient Have a Medical Advance Directive? Yes  Type of Advance Directive Out of facility DNR (pink MOST or yellow form);Healthcare Power of [place];Living will  Does patient want to make changes to medical advance directive? No - Patient declined  Copy of Healthcare Power of Attorney in Chart? Yes - validated most recent copy scanned in chart (See row information)  Pre-existing out of facility DNR order (yellow form or pink MOST form) Yellow form placed in chart (order not valid for inpatient use)     Chief Complaint  Patient presents with  . Medical Management of Chronic Issues  . Health Maintenance    Dexa scan    HPI: Patient is a 86 y.o. female seen today for medical management of chronic diseases.    Patient has history of dementia, anxiety,history of right humerus fracture, hypertension and hyperlipidemia. History of  Acute CVAMRI showed Acute infarction on Left Para median Pons Lives in SNF. Wheel chair Dependent Weight stable. No New Nursing issues But continues to have this vesicular rash in her Left arm which looks like the Place she has been Picking. She told me that she itches and that's why she picks on it No Other Complain today   Past Medical History:  Diagnosis Date  . Allergic rhinitis   . Anxiety   . Balance problem 04/28/2015  . Fatigue 04/28/2015  . Hearing loss 04/28/2015  . history of Fracture of right humerus 04/28/2015  . HLD (hyperlipidemia) 03/05/2019  . Hyperglycemia   . Hyperlipidemia   . Hypertension   . Memory loss 04/28/2015   04/26/2014 MMSE 28/30. Failed clock drawing. 04/21/15 MMSE 21/30. Failed clock drawing.   . Osteoporosis     Past Surgical History:  Procedure Laterality Date  . ABDOMINAL HYSTERECTOMY    . CATARACT EXTRACTION  2010  . ELBOW  SURGERY Right 1988  . FEMUR FRACTURE SURGERY Right 06/2014   [place], [place] Dr. Dione Housekeeper  . HUMERUS FRACTURE SURGERY Right 2015   [place] Dr. Eber Hong  . TONSILLECTOMY  1935    Allergies  Allergen Reactions  . Ace Inhibitors Other (See Comments)    Unknown reaction  . Almond Oil Rash  . Plum Pulp Rash    Outpatient Encounter Medications as of 03/31/2020  Medication Sig  . amLODipine (NORVASC) 2.5 MG tablet Take 2.5 mg by mouth daily.  Marland Kitchen atorvastatin (LIPITOR) 40 MG tablet Take 1 tablet (40 mg total) by mouth daily at 6 PM.  . calcium carbonate (OSCAL) 1500 (600 Ca) MG TABS tablet Take 600 mg of elemental calcium by mouth daily with breakfast.  . Chloroxylenol, Antiseptic, (PREP PROTECTIVE SKIN BARRIER EX) Apply topically. Apply skin prep to heels twice a day  . Cholecalciferol (VITAMIN D3) 1000 UNITS CAPS Take 1,000 Units daily by mouth.   . clopidogrel (PLAVIX) 75 MG tablet Take 1 tablet (75 mg total) by mouth daily.  Marland Kitchen donepezil (ARICEPT) 10 MG tablet TAKE ONE TABLET BY MOUTH DAILY TO PRESERVE MEMORY  . memantine (NAMENDA) 10 MG tablet Take 1 tablet (10 mg total) by mouth 2 (two) times daily.  . Omega-3 Fatty Acids (FISH OIL) 500 MG CAPS Take 1 capsule by mouth daily.  . pantoprazole (PROTONIX) 40 MG tablet Take 1 tablet (40 mg total) by mouth daily.  . sertraline (  ZOLOFT) 25 MG tablet Take 25 mg by mouth daily.  Marland Kitchen zinc oxide 20 % ointment Apply 1 application topically as needed for irritation. Apply to peri/buttocks after every incontinent episode   No facility-administered encounter medications on file as of 03/31/2020.    Review of Systems:  Review of Systems  Health Maintenance  Topic Date Due  . DEXA SCAN  Never done  . INFLUENZA VACCINE  07/13/2020  . TETANUS/TDAP  12/14/2023  . COVID-19 Vaccine  Completed  . PNA vac Low Risk Adult  Completed    Physical Exam: Vitals:   03/31/20 1434  BP: (!) 162/88  Pulse: 67  Resp: 17  Temp: 97.6 F (36.4 C)    SpO2: 95%  Weight: 143 lb 9.6 oz (65.1 kg)  Height: 5\' 2"  (1.575 m)   Body mass index is 26.26 kg/m. Physical Exam Constitutional:  Well-developed and well-nourished.  HENT:  Head: Normocephalic.  Mouth/Throat: Oropharynx is clear and moist.  Eyes: Pupils are equal, round, and reactive to light.  Neck: Neck supple.  Cardiovascular: Normal rate and normal heart sounds.  No murmur heard. Pulmonary/Chest: Effort normal and breath sounds normal. No respiratory distress. No wheezes. She has no rales.  Abdominal: Soft. Bowel sounds are normal. No distension. There is no tenderness. There is no rebound.  Musculoskeletal: No edema.  Lymphadenopathy: none Neurological: No Focal Deficits  Skin: Skin is warm and dry. Has Vesicular Rash in her Left Upper and Lower arm Psychiatric: Normal mood and affect. Behavior is normal. Thought content normal.   Labs reviewed: Basic Metabolic Panel: Recent Labs    06/28/19 0000 09/20/19 0000  NA 143 144  K 4.2 4.2  CL 108 107  CO2 26 29  BUN 24* 33*  CREATININE 0.8 0.9  CALCIUM 8.9 9.2   Liver Function Tests: Recent Labs    06/28/19 0000 09/20/19 0000  AST 11* 12*  ALT 15 16  ALKPHOS 60 72  PROT 5.4 5.9*  ALBUMIN 3.5 3.8   No results for input(s): LIPASE, AMYLASE in the last 8760 hours. No results for input(s): AMMONIA in the last 8760 hours. CBC: Recent Labs    06/28/19 0000 09/20/19 0000  WBC 7.7 7.5  HGB 11.4* 12.0  HCT 35* 38  PLT 202 233   Lipid Panel: Recent Labs    06/28/19 0000  CHOL 119  HDL 38  LDLCALC 65  TRIG 78   Lab Results  Component Value Date   HGBA1C 5.9 (H) 03/06/2019    Procedures since last visit: No results found.  Assessment/Plan  Essential hypertension Mildily High Again Today But most Readings are below 140/90 Will monitor  Rash and nonspecific skin eruption Trimicinolone Cream BID PRN for Red areas in her Left Hand ? Skin Picking as no where else beside Left arm Vascular  dementia without behavioral disturbance ([facility]) On Aricept and Namenda GERD On Prilosec  Osteoporosis  Not candidate for any Stronger treatment Continue Calcium and Vit D Check the level  Hyperlipidemia On Lipitor Repeat Fasting Lipid S/P CVA On Plavix and Statin Depression Stable on Zoloft Not candidate for GDR Labs/tests ordered:  Routine Labs including CBC,CMP, TSH, Vit D level, Fasting Lipid Next appt:  Visit date not found

## 2020-04-03 DIAGNOSIS — F0390 Unspecified dementia without behavioral disturbance: Secondary | ICD-10-CM | POA: Diagnosis not present

## 2020-04-03 DIAGNOSIS — Z13220 Encounter for screening for lipoid disorders: Secondary | ICD-10-CM | POA: Diagnosis not present

## 2020-04-03 DIAGNOSIS — E559 Vitamin D deficiency, unspecified: Secondary | ICD-10-CM | POA: Diagnosis not present

## 2020-04-03 DIAGNOSIS — R6889 Other general symptoms and signs: Secondary | ICD-10-CM | POA: Diagnosis not present

## 2020-04-03 DIAGNOSIS — E039 Hypothyroidism, unspecified: Secondary | ICD-10-CM | POA: Diagnosis not present

## 2020-04-03 LAB — COMPREHENSIVE METABOLIC PANEL
Albumin: 3.6 (ref 3.5–5.0)
Calcium: 9.1 (ref 8.7–10.7)
Globulin: 2.2

## 2020-04-03 LAB — CBC AND DIFFERENTIAL
HCT: 35 — AB (ref 36–46)
Hemoglobin: 11.4 — AB (ref 12.0–16.0)
Neutrophils Absolute: 4645
Platelets: 228 (ref 150–399)
WBC: 7.9

## 2020-04-03 LAB — BASIC METABOLIC PANEL
BUN: 23 — AB (ref 4–21)
CO2: 29 — AB (ref 13–22)
Chloride: 106 (ref 99–108)
Creatinine: 1 (ref 0.5–1.1)
Glucose: 84
Potassium: 4 (ref 3.4–5.3)
Sodium: 143 (ref 137–147)

## 2020-04-03 LAB — CBC: RBC: 3.74 — AB (ref 3.87–5.11)

## 2020-04-03 LAB — LIPID PANEL
Cholesterol: 131 (ref 0–200)
HDL: 43 (ref 35–70)
LDL Cholesterol: 72
LDl/HDL Ratio: 3
Triglycerides: 80 (ref 40–160)

## 2020-04-03 LAB — HEPATIC FUNCTION PANEL
ALT: 15 (ref 7–35)
AST: 13 (ref 13–35)
Alkaline Phosphatase: 70 (ref 25–125)
Bilirubin, Total: 0.5

## 2020-04-03 LAB — TSH: TSH: 2.39 (ref 0.41–5.90)

## 2020-04-11 ENCOUNTER — Encounter: Payer: Self-pay | Admitting: Nurse Practitioner

## 2020-04-11 ENCOUNTER — Non-Acute Institutional Stay (SKILLED_NURSING_FACILITY): Payer: PPO | Admitting: Nurse Practitioner

## 2020-04-11 DIAGNOSIS — F419 Anxiety disorder, unspecified: Secondary | ICD-10-CM

## 2020-04-11 DIAGNOSIS — F0150 Vascular dementia without behavioral disturbance: Secondary | ICD-10-CM | POA: Diagnosis not present

## 2020-04-11 DIAGNOSIS — K219 Gastro-esophageal reflux disease without esophagitis: Secondary | ICD-10-CM

## 2020-04-11 DIAGNOSIS — I10 Essential (primary) hypertension: Secondary | ICD-10-CM | POA: Diagnosis not present

## 2020-04-11 NOTE — Progress Notes (Signed)
Location:    [facility] Room Number: 18 Place of Service:  SNF (31) Provider:  Sydell Axon, Arna Snipe NP   [name], MD  Patient Care Team: [name], MD as PCP - General (Internal Medicine) Ngetich, Donalee Citrin, NP as Nurse Practitioner [facility] Medicine)  Extended Emergency Contact Information Primary Emergency Contact: Gordan,[place] Address: [address]          [place], [place] [address] Darden Amber of [place] Home Phone: [phone] Relation: Daughter Secondary Emergency Contact: Iven Finn Address: [address]          [place], [place] [address] [place] of [place] Home Phone: [phone] Relation: Spouse  Code Status:  DNR Goals of care: Advanced Directive information Advanced Directives 04/11/2020  Does Patient Have a Medical Advance Directive? Yes  Type of Estate agent of [place];Out of facility DNR (pink MOST or yellow form);Living will  Does patient want to make changes to medical advance directive? No - Patient declined  Copy of Healthcare Power of Attorney in Chart? Yes - validated most recent copy scanned in chart (See row information)  Pre-existing out of facility DNR order (yellow form or pink MOST form) Yellow form placed in chart (order not valid for inpatient use)     Chief Complaint  Patient presents with  . Medical Management of Chronic Issues  . Health Maintenance    Dexa scan    HPI:  Pt is a 86 y.o. female seen today for medical management of chronic diseases.  The patient resides in SNF [facility] for safety, care assistance, w/c for mobility, on Menmantine 10mg  bid, Donepezil 10mg  qd for memory, her mood is stable, on Sertraline 25mg  qd. GERD, stable, on Pantoprazole 40mg  qd. HTN, noted elevated Bp, on amlodipine 2.5mg , the patient denied headache, dizziness, change of vision, chest pain/pressure, palpitation, nausea, vomiting or abd pain.   Past Medical History:  Diagnosis Date  . Allergic rhinitis    . Anxiety   . Balance problem 04/28/2015  . Fatigue 04/28/2015  . Hearing loss 04/28/2015  . history of Fracture of right humerus 04/28/2015  . HLD (hyperlipidemia) 03/05/2019  . Hyperglycemia   . Hyperlipidemia   . Hypertension   . Memory loss 04/28/2015   04/26/2014 MMSE 28/30. Failed clock drawing. 04/21/15 MMSE 21/30. Failed clock drawing.   . Osteoporosis    Past Surgical History:  Procedure Laterality Date  . ABDOMINAL HYSTERECTOMY    . CATARACT EXTRACTION  2010  . ELBOW SURGERY Right 1988  . FEMUR FRACTURE SURGERY Right 06/2014   [place] Dr. 06/21/15  . HUMERUS FRACTURE SURGERY Right 2015   [place] Dr. 2011  . TONSILLECTOMY  1935    Allergies  Allergen Reactions  . Ace Inhibitors Other (See Comments)    Unknown reaction  . Almond Oil Rash  . Plum Pulp Rash    Allergies as of 04/11/2020      Reactions   Ace Inhibitors Other (See Comments)   Unknown reaction   Almond Oil Rash   Plum Pulp Rash      Medication List       Accurate as of April 11, 2020 11:59 PM. If you have any questions, ask your nurse or doctor.        amLODipine 2.5 MG tablet Commonly known as: NORVASC Take 2.5 mg by mouth daily.   atorvastatin 40 MG tablet Commonly known as: LIPITOR Take 1 tablet (40 mg total) by mouth daily at 6 PM.  calcium carbonate 1500 (600 Ca) MG Tabs tablet Commonly known as: OSCAL Take 600 mg of elemental calcium by mouth daily with breakfast.   clopidogrel 75 MG tablet Commonly known as: PLAVIX Take 1 tablet (75 mg total) by mouth daily.   donepezil 10 MG tablet Commonly known as: ARICEPT TAKE ONE TABLET BY MOUTH DAILY TO PRESERVE MEMORY   Fish Oil 500 MG Caps Take 1 capsule by mouth daily.   memantine 10 MG tablet Commonly known as: Namenda Take 1 tablet (10 mg total) by mouth 2 (two) times daily.   pantoprazole 40 MG tablet Commonly known as: Protonix Take 1 tablet (40 mg total) by mouth daily.   PREP PROTECTIVE SKIN  BARRIER EX Apply topically. Apply skin prep to heels twice a day   sertraline 25 MG tablet Commonly known as: ZOLOFT Take 25 mg by mouth daily.   triamcinolone cream 0.1 % Commonly known as: KENALOG Apply 1 application topically 2 (two) times daily as needed.   Vitamin D3 25 MCG (1000 UT) Caps Take 1,000 Units daily by mouth.   zinc oxide 20 % ointment Apply 1 application topically as needed for irritation. Apply to peri/buttocks after every incontinent episode       Review of Systems  Constitutional: Negative for activity change, appetite change, fatigue, fever and unexpected weight change.  HENT: Positive for hearing loss. Negative for congestion and voice change.   Eyes: Negative for visual disturbance.  Respiratory: Negative for cough, chest tightness and shortness of breath.   Cardiovascular: Negative for chest pain, palpitations and leg swelling.  Gastrointestinal: Negative for abdominal distention, abdominal pain and constipation.  Genitourinary: Negative for difficulty urinating, dysuria and urgency.  Musculoskeletal: Positive for arthralgias and gait problem.  Skin: Negative for color change and rash.  Neurological: Negative for dizziness, facial asymmetry and speech difficulty.       Dementia.   Psychiatric/Behavioral: Positive for confusion. Negative for agitation and sleep disturbance. The patient is not nervous/anxious.     Immunization History  Administered Date(s) Administered  . DTaP 05/12/2014  . Influenza, High Dose Seasonal PF 09/21/2017, 09/27/2019  . Influenza,inj,Quad PF,6+ Mos 09/14/2018  . Influenza-Unspecified 10/07/2014, 09/11/2015, 09/23/2016  . Moderna SARS-COVID-2 Vaccination 12/17/2019, 01/14/2020  . PPD Test 10/16/2014  . Pneumococcal Conjugate-13 10/20/2017  . Pneumococcal Polysaccharide-23 08/20/2011   Pertinent  Health Maintenance Due  Topic Date Due  . DEXA SCAN  Never done  . INFLUENZA VACCINE  07/13/2020  . PNA vac Low Risk Adult   Completed   Fall Risk  11/21/2018 10/14/2017 02/17/2016 01/06/2015  Falls in the past year? 0 No No Yes  Comment - - - fx (R) humerus; fx (R) femur  Number falls in past yr: 0 - - 2 or more  Injury with Fall? 0 - - -  Risk for fall due to : - - - History of fall(s)   Functional Status Survey:    Vitals:   04/11/20 1350  BP: (!) 160/90  Pulse: 63  Resp: (!) 21  Temp: 97.6 F (36.4 C)  SpO2: 96%  Weight: 143 lb 9.6 oz (65.1 kg)  Height: 5\' 2"  (1.575 m)   Body mass index is 26.26 kg/m. Physical Exam Vitals and nursing note reviewed.  Constitutional:      Appearance: Normal appearance.  HENT:     Head: Normocephalic and atraumatic.     Mouth/Throat:     Mouth: Mucous membranes are moist.  Eyes:     Extraocular Movements: Extraocular movements intact.  Conjunctiva/sclera: Conjunctivae normal.     Pupils: Pupils are equal, round, and reactive to light.  Cardiovascular:     Rate and Rhythm: Normal rate and regular rhythm.     Heart sounds: No murmur.  Pulmonary:     Breath sounds: No wheezing or rales.  Abdominal:     General: Bowel sounds are normal. There is no distension.     Palpations: Abdomen is soft.     Tenderness: There is no abdominal tenderness.  Musculoskeletal:     Cervical back: Normal range of motion and neck supple.     Right lower leg: No edema.     Left lower leg: No edema.  Skin:    General: Skin is warm and dry.     Findings: No rash.  Neurological:     General: No focal deficit present.     Mental Status: She is alert. Mental status is at baseline.     Motor: No weakness.     Coordination: Coordination normal.     Gait: Gait abnormal.     Comments: Oriented to self. Slightly weakness in RLE  Psychiatric:        Mood and Affect: Mood normal.        Behavior: Behavior normal.     Labs reviewed: Recent Labs    06/28/19 0000 09/20/19 0000  NA 143 144  K 4.2 4.2  CL 108 107  CO2 26 29  BUN 24* 33*  CREATININE 0.8 0.9  CALCIUM 8.9  9.2   Recent Labs    06/28/19 0000 09/20/19 0000  AST 11* 12*  ALT 15 16  ALKPHOS 60 72  PROT 5.4 5.9*  ALBUMIN 3.5 3.8   Recent Labs    06/28/19 0000 09/20/19 0000  WBC 7.7 7.5  HGB 11.4* 12.0  HCT 35* 38  PLT 202 233   Lab Results  Component Value Date   TSH 1.95 11/13/2018   Lab Results  Component Value Date   HGBA1C 5.9 (H) 03/06/2019   Lab Results  Component Value Date   CHOL 119 06/28/2019   HDL 38 06/28/2019   LDLCALC 65 06/28/2019   TRIG 78 06/28/2019   CHOLHDL 6.3 03/06/2019    Significant Diagnostic Results in last 30 days:  No results found.  Assessment/Plan Hypertension Noted elevated Bp 160/90, symptomatic, will increase Amlodipine to 5mg /2.5mg  po qd, observe.   GERD (gastroesophageal reflux disease) Stable, continue Pantoprazole.   Vascular dementia ([place]) No behavioral issues, continue SNF [facility] for safety, care assistance, continue Memantine, Donepezil for memory.   Anxiety Her mood is stable, continue Sertraline 25mg  qd.      Family/ staff Communication: plan of care reviewed with the patient and charge nurse.   Labs/tests ordered: none  Time spend 25 minutes.

## 2020-04-11 NOTE — Assessment & Plan Note (Signed)
No behavioral issues, continue SNF [facility] for safety, care assistance, continue Memantine, Donepezil for memory.

## 2020-04-11 NOTE — Assessment & Plan Note (Signed)
Noted elevated Bp 160/90, symptomatic, will increase Amlodipine to 5mg /2.5mg  po qd, observe.

## 2020-04-11 NOTE — Assessment & Plan Note (Signed)
Stable, continue Pantoprazole.  

## 2020-04-11 NOTE — Assessment & Plan Note (Signed)
Her mood is stable, continue Sertraline 25mg qd.  

## 2020-04-17 DIAGNOSIS — E559 Vitamin D deficiency, unspecified: Secondary | ICD-10-CM | POA: Diagnosis not present

## 2020-04-17 LAB — VITAMIN D 25 HYDROXY (VIT D DEFICIENCY, FRACTURES): Vit D, 25-Hydroxy: 43

## 2020-05-22 DIAGNOSIS — R278 Other lack of coordination: Secondary | ICD-10-CM | POA: Diagnosis not present

## 2020-05-22 DIAGNOSIS — Z8781 Personal history of (healed) traumatic fracture: Secondary | ICD-10-CM | POA: Diagnosis not present

## 2020-05-22 DIAGNOSIS — M810 Age-related osteoporosis without current pathological fracture: Secondary | ICD-10-CM | POA: Diagnosis not present

## 2020-05-22 DIAGNOSIS — R2689 Other abnormalities of gait and mobility: Secondary | ICD-10-CM | POA: Diagnosis not present

## 2020-05-22 DIAGNOSIS — R2681 Unsteadiness on feet: Secondary | ICD-10-CM | POA: Diagnosis not present

## 2020-05-22 DIAGNOSIS — M6281 Muscle weakness (generalized): Secondary | ICD-10-CM | POA: Diagnosis not present

## 2020-05-26 ENCOUNTER — Encounter: Payer: Self-pay | Admitting: Internal Medicine

## 2020-05-26 ENCOUNTER — Non-Acute Institutional Stay (SKILLED_NURSING_FACILITY): Payer: PPO | Admitting: Internal Medicine

## 2020-05-26 DIAGNOSIS — S00521A Blister (nonthermal) of lip, initial encounter: Secondary | ICD-10-CM

## 2020-05-26 DIAGNOSIS — R220 Localized swelling, mass and lump, head: Secondary | ICD-10-CM

## 2020-05-26 DIAGNOSIS — L089 Local infection of the skin and subcutaneous tissue, unspecified: Secondary | ICD-10-CM | POA: Diagnosis not present

## 2020-05-26 DIAGNOSIS — F0150 Vascular dementia without behavioral disturbance: Secondary | ICD-10-CM

## 2020-05-26 DIAGNOSIS — I10 Essential (primary) hypertension: Secondary | ICD-10-CM | POA: Diagnosis not present

## 2020-05-26 NOTE — Progress Notes (Signed)
Location:    [facility] Room Number: 18 Place of Service:  SNF [phone]) Provider:  Veleta Miners MD   Virgie Dad, MD  Patient Care Team: Virgie Dad, MD as PCP - General (Internal Medicine) Ngetich, Nelda Bucks, NP as Nurse Practitioner [facility] Medicine)  Extended Emergency Contact Information Primary Emergency Contact: Gordan,[place] Address: [address]          [place], [place] [address] Johnnette Litter of [place] Phone: [phone] Relation: Daughter Secondary Emergency Contact: Verneda Skill Address: [address]          [place], [place] [address] [place] of [place] Phone: [phone] Relation: Spouse  Code Status:  DNR Goals of care: Advanced Directive information Advanced Directives 05/26/2020  Does Patient Have a Medical Advance Directive? Yes  Type of Paramedic of [place];Living will;Out of facility DNR (pink MOST or yellow form)  Does patient want to make changes to medical advance directive? No - Patient declined  Copy of [place] in Chart? Yes - validated most recent copy scanned in chart (See row information)  Pre-existing out of facility DNR order (yellow form or pink MOST form) Yellow form placed in chart (order not valid for inpatient use)     Chief Complaint  Patient presents with  . Acute Visit    Lip swelling    HPI:  Pt is a 86 y.o. female seen today for an acute visit for Swelling of her Lip  Patient has history of dementia, anxiety,history of right humerus fracture, hypertension and hyperlipidemia. History of  Acute CVAMRI showed Acute infarction on Left Para median Pons Lives in SNF. Wheel chair Dependent  Patient was noticed to have swollen lip few days ago.  Initially for [facility] allergic reaction she was given Benadryl.  But then it got more red and painful.  The provider on call was called and she started on doxycycline.  Patient is feeling much better today  states it does not hurt that much.  No fever chills or any other complaints  Past Medical History:  Diagnosis Date  . Allergic rhinitis   . Anxiety   . Balance problem 04/28/2015  . Fatigue 04/28/2015  . Hearing loss 04/28/2015  . history of Fracture of right humerus 04/28/2015  . HLD (hyperlipidemia) 03/05/2019  . Hyperglycemia   . Hyperlipidemia   . Hypertension   . Memory loss 04/28/2015   04/26/2014 MMSE 28/30. Failed clock drawing. 04/21/15 MMSE 21/30. Failed clock drawing.   . Osteoporosis    Past Surgical History:  Procedure Laterality Date  . ABDOMINAL HYSTERECTOMY    . CATARACT EXTRACTION  2010  . ELBOW SURGERY Right 1988  . FEMUR FRACTURE SURGERY Right 06/2014   [place], [place] Dr. Dorita Fray  . HUMERUS FRACTURE SURGERY Right 2015   [place] Dr. Noemi Chapel  . TONSILLECTOMY  1935    Allergies  Allergen Reactions  . Ace Inhibitors Other (See Comments)    Unknown reaction  . Almond Oil Rash  . Plum Pulp Rash    Allergies as of 05/26/2020      Reactions   Ace Inhibitors Other (See Comments)   Unknown reaction   Almond Oil Rash   Plum Pulp Rash      Medication List       Accurate as of May 26, 2020 11:51 AM. If you have any questions, ask your nurse or doctor.        amLODipine 5 MG tablet  Commonly known as: NORVASC Take 5 mg by mouth daily.   atorvastatin 40 MG tablet Commonly known as: LIPITOR Take 1 tablet (40 mg total) by mouth daily at 6 PM.   calcium carbonate 1500 (600 Ca) MG Tabs tablet Commonly known as: OSCAL Take 600 mg of elemental calcium by mouth daily with breakfast.   clopidogrel 75 MG tablet Commonly known as: PLAVIX Take 1 tablet (75 mg total) by mouth daily.   donepezil 10 MG tablet Commonly known as: ARICEPT TAKE ONE TABLET BY MOUTH DAILY TO PRESERVE MEMORY   doxycycline 100 MG capsule Commonly known as: VIBRAMYCIN Take 100 mg by mouth 2 (two) times daily. For 7days   Fish Oil 500 MG Caps Take 1 capsule by mouth  daily.   memantine 10 MG tablet Commonly known as: Namenda Take 1 tablet (10 mg total) by mouth 2 (two) times daily.   pantoprazole 40 MG tablet Commonly known as: Protonix Take 1 tablet (40 mg total) by mouth daily.   PREP PROTECTIVE SKIN BARRIER EX Apply topically. Apply skin prep to heels twice a day   sertraline 25 MG tablet Commonly known as: ZOLOFT Take 25 mg by mouth daily.   triamcinolone cream 0.1 % Commonly known as: KENALOG Apply 1 application topically 2 (two) times daily as needed.   Vitamin D3 25 MCG (1000 UT) Caps Take 1,000 Units daily by mouth.   zinc oxide 20 % ointment Apply 1 application topically as needed for irritation. Apply to peri/buttocks after every incontinent episode       Review of Systems  Review of Systems  Constitutional: Negative for activity change, appetite change, chills, diaphoresis, fatigue and fever.  HENT: Negative for mouth sores, postnasal drip, rhinorrhea, sinus pain and sore throat.   Respiratory: Negative for apnea, cough, chest tightness, shortness of breath and wheezing.   Cardiovascular: Negative for chest pain, palpitations and leg swelling.  Gastrointestinal: Negative for abdominal distention, abdominal pain, constipation, diarrhea, nausea and vomiting.  Genitourinary: Negative for dysuria and frequency.  Musculoskeletal: Negative for arthralgias, joint swelling and myalgias.  Skin: Negative for rash.  Neurological: Negative for dizziness, syncope, weakness, light-headedness and numbness.  Psychiatric/Behavioral: Negative for behavioral problems, confusion and sleep disturbance.     Immunization History  Administered Date(s) Administered  . DTaP 05/12/2014  . Influenza, High Dose Seasonal PF 09/21/2017, 09/27/2019  . Influenza,inj,Quad PF,6+ Mos 09/14/2018  . Influenza-Unspecified 10/07/2014, 09/11/2015, 09/23/2016  . Moderna SARS-COVID-2 Vaccination 12/17/2019, 01/14/2020  . PPD Test 10/16/2014  . Pneumococcal  Conjugate-13 10/20/2017  . Pneumococcal Polysaccharide-23 08/20/2011   Pertinent  Health Maintenance Due  Topic Date Due  . DEXA SCAN  Never done  . INFLUENZA VACCINE  07/13/2020  . PNA vac Low Risk Adult  Completed   Fall Risk  11/21/2018 10/14/2017 02/17/2016 01/06/2015  Falls in the past year? 0 No No Yes  Comment - - - fx (R) humerus; fx (R) femur  Number falls in past yr: 0 - - 2 or more  Injury with Fall? 0 - - -  Risk for fall due to : - - - History of fall(s)   Functional Status Survey:    Vitals:   05/26/20 1145  BP: (!) 143/83  Pulse: 71  Resp: 20  Temp: 98.1 F (36.7 C)  SpO2: 97%  Weight: 149 lb (67.6 kg)  Height: 5\' 2"  (1.575 m)   Body mass index is 27.25 kg/m. Physical Exam  Constitutional: . Well-developed and well-nourished.  HENT:  Head: Normocephalic.  Mouth/Throat: Oropharynx is clear and moist.  Has Infected Blister in Lower Lip Eyes: Pupils are equal, round, and reactive to light.  Neck: Neck supple.  Cardiovascular: Normal rate and normal heart sounds.  No murmur heard. Pulmonary/Chest: Effort normal and breath sounds normal. No respiratory distress. No wheezes. She has no rales.  Abdominal: Soft. Bowel sounds are normal. No distension. There is no tenderness. There is no rebound.  Musculoskeletal: No edema.  Lymphadenopathy: none Neurological: No Focal deficits  Skin: Skin is warm and dry.  Psychiatric: Normal mood and affect. Behavior is normal. Thought content normal.    Labs reviewed: Recent Labs    06/28/19 0000 09/20/19 0000 04/03/20 0000  NA 143 144 143  K 4.2 4.2 4.0  CL 108 107 106  CO2 26 29 29*  BUN 24* 33* 23*  CREATININE 0.8 0.9 1.0  CALCIUM 8.9 9.2 9.1   Recent Labs    06/28/19 0000 09/20/19 0000 04/03/20 0000  AST 11* 12* 13  ALT 15 16 15   ALKPHOS 60 72 70  PROT 5.4 5.9*  --   ALBUMIN 3.5 3.8 3.6   Recent Labs    06/28/19 0000 09/20/19 0000 04/03/20 0000  WBC 7.7 7.5 7.9  NEUTROABS  --   --  4,645  HGB  11.4* 12.0 11.4*  HCT 35* 38 35*  PLT 202 233 228   Lab Results  Component Value Date   TSH 2.39 04/03/2020   Lab Results  Component Value Date   HGBA1C 5.9 (H) 03/06/2019   Lab Results  Component Value Date   CHOL 131 04/03/2020   HDL 43 04/03/2020   LDLCALC 72 04/03/2020   TRIG 80 04/03/2020   CHOLHDL 6.3 03/06/2019    Significant Diagnostic Results in last 30 days:  No results found.  Assessment/Plan Lip swelling and Blister of lip with infection, initial encounter Was thought to be due to allergiuc reaction but seems like Blister got infected Better with Doxycyline   Essential hypertension Continue Norvasc Vascular dementia without behavioral disturbance (HCC) Aricept and Namenda Also on Lipitor    Family/ staff Communication:   Labs/tests ordered:

## 2020-06-05 ENCOUNTER — Non-Acute Institutional Stay (SKILLED_NURSING_FACILITY): Payer: PPO | Admitting: Nurse Practitioner

## 2020-06-05 ENCOUNTER — Encounter: Payer: Self-pay | Admitting: Nurse Practitioner

## 2020-06-05 DIAGNOSIS — K219 Gastro-esophageal reflux disease without esophagitis: Secondary | ICD-10-CM

## 2020-06-05 DIAGNOSIS — F0150 Vascular dementia without behavioral disturbance: Secondary | ICD-10-CM | POA: Diagnosis not present

## 2020-06-05 DIAGNOSIS — I6302 Cerebral infarction due to thrombosis of basilar artery: Secondary | ICD-10-CM

## 2020-06-05 DIAGNOSIS — R635 Abnormal weight gain: Secondary | ICD-10-CM | POA: Diagnosis not present

## 2020-06-05 DIAGNOSIS — F419 Anxiety disorder, unspecified: Secondary | ICD-10-CM

## 2020-06-05 NOTE — Progress Notes (Signed)
Location:    [facility] Room Number: 18 Place of Service:  SNF (31) Provider:  Sydell Axon, Arna Snipe NP   [name], MD  Patient Care Team: [name], MD as PCP - General (Internal Medicine) Ngetich, Donalee Citrin, NP as Nurse Practitioner [facility] Medicine)  Extended Emergency Contact Information Primary Emergency Contact: Gordan,[place] Address: [address]          [place], [place] [address] [place] of [place] Home Phone: [phone] Relation: Daughter Secondary Emergency Contact: Iven Finn Address: [address]          [place], [place] [address] [place] of [place] Home Phone: [phone] Relation: Spouse  Code Status:  DNR Goals of care: Advanced Directive information Advanced Directives 05/26/2020  Does Patient Have a Medical Advance Directive? Yes  Type of Estate agent of [place];Living will;Out of facility DNR (pink MOST or yellow form)  Does patient want to make changes to medical advance directive? No - Patient declined  Copy of Healthcare Power of Attorney in Chart? Yes - validated most recent copy scanned in chart (See row information)  Pre-existing out of facility DNR order (yellow form or pink MOST form) Yellow form placed in chart (order not valid for inpatient use)     Chief Complaint  Patient presents with  . Medical Management of Chronic Issues    HPI:  Pt is a 86 y.o. female seen today for medical management of chronic diseases.    Dementia, an resident of SNF [facility], takes Menmantine 10mg  bid, Donepezil 10mg  qd for memory  Her mood is stable, on Sertraline 25mg  qd.   GERD, stable, on Pantoprazole 40mg  qd.   HTN, takes  amlodipine 5mg , Atorvastatin 40mg  qd  Past Medical History:  Diagnosis Date  . Allergic rhinitis   . Anxiety   . Balance problem 04/28/2015  . Fatigue 04/28/2015  . Hearing loss 04/28/2015  . history of Fracture of right humerus 04/28/2015  . HLD (hyperlipidemia) 03/05/2019  .  Hyperglycemia   . Hyperlipidemia   . Hypertension   . Memory loss 04/28/2015   04/26/2014 MMSE 28/30. Failed clock drawing. 04/21/15 MMSE 21/30. Failed clock drawing.   . Osteoporosis    Past Surgical History:  Procedure Laterality Date  . ABDOMINAL HYSTERECTOMY    . CATARACT EXTRACTION  2010  . ELBOW SURGERY Right 1988  . FEMUR FRACTURE SURGERY Right 06/2014   [place] Dr. 04/30/2015  . HUMERUS FRACTURE SURGERY Right 2015   [place] Dr. 04/28/2014  . TONSILLECTOMY  1935    Allergies  Allergen Reactions  . Ace Inhibitors Other (See Comments)    Unknown reaction  . Almond Oil Rash  . Plum Pulp Rash    Allergies as of 06/05/2020      Reactions   Ace Inhibitors Other (See Comments)   Unknown reaction   Almond Oil Rash   Plum Pulp Rash      Medication List       Accurate as of June 05, 2020 11:59 PM. If you have any questions, ask your nurse or doctor.        amLODipine 5 MG tablet Commonly known as: NORVASC Take 5 mg by mouth daily.   atorvastatin 40 MG tablet Commonly known as: LIPITOR Take 1 tablet (40 mg total) by mouth daily at 6 PM.   calcium carbonate 1500 (600 Ca) MG Tabs tablet Commonly known as: OSCAL Take 600 mg of elemental calcium by mouth daily with breakfast.  clopidogrel 75 MG tablet Commonly known as: PLAVIX Take 1 tablet (75 mg total) by mouth daily.   donepezil 10 MG tablet Commonly known as: ARICEPT TAKE ONE TABLET BY MOUTH DAILY TO PRESERVE MEMORY   Fish Oil 500 MG Caps Take 1 capsule by mouth daily.   memantine 10 MG tablet Commonly known as: Namenda Take 1 tablet (10 mg total) by mouth 2 (two) times daily.   pantoprazole 40 MG tablet Commonly known as: Protonix Take 1 tablet (40 mg total) by mouth daily.   PREP PROTECTIVE SKIN BARRIER EX Apply topically. Apply skin prep to heels twice a day   sertraline 25 MG tablet Commonly known as: ZOLOFT Take 25 mg by mouth daily.   triamcinolone cream 0.1 % Commonly  known as: KENALOG Apply 1 application topically 2 (two) times daily as needed.   Vitamin D3 25 MCG (1000 UT) Caps Take 1,000 Units daily by mouth.   zinc oxide 20 % ointment Apply 1 application topically as needed for irritation. Apply to peri/buttocks after every incontinent episode       Review of Systems  Constitutional: Positive for unexpected weight change. Negative for fatigue and fever.       Gradual weight gained.   HENT: Positive for hearing loss. Negative for congestion and voice change.   Eyes: Negative for visual disturbance.  Respiratory: Negative for cough and shortness of breath.   Cardiovascular: Negative for leg swelling.  Gastrointestinal: Negative for abdominal pain and constipation.  Genitourinary: Negative for difficulty urinating, dysuria and urgency.  Musculoskeletal: Positive for arthralgias and gait problem.  Skin: Negative for color change.  Neurological: Negative for speech difficulty, light-headedness and headaches.       Dementia.   Psychiatric/Behavioral: Positive for confusion. Negative for sleep disturbance. The patient is not nervous/anxious.     Immunization History  Administered Date(s) Administered  . DTaP 05/12/2014  . Influenza, High Dose Seasonal PF 09/21/2017, 09/27/2019  . Influenza,inj,Quad PF,6+ Mos 09/14/2018  . Influenza-Unspecified 10/07/2014, 09/11/2015, 09/23/2016  . Moderna SARS-COVID-2 Vaccination 12/17/2019, 01/14/2020  . PPD Test 10/16/2014  . Pneumococcal Conjugate-13 10/20/2017  . Pneumococcal Polysaccharide-23 08/20/2011   Pertinent  Health Maintenance Due  Topic Date Due  . DEXA SCAN  Never done  . INFLUENZA VACCINE  07/13/2020  . PNA vac Low Risk Adult  Completed   Fall Risk  11/21/2018 10/14/2017 02/17/2016 01/06/2015  Falls in the past year? 0 No No Yes  Comment - - - fx (R) humerus; fx (R) femur  Number falls in past yr: 0 - - 2 or more  Injury with Fall? 0 - - -  Risk for fall due to : - - - History of fall(s)     Functional Status Survey:    Vitals:   06/05/20 1318  BP: 135/74  Pulse: 79  Resp: 18  Temp: (!) 97.4 F (36.3 C)  SpO2: 96%  Weight: 149 lb (67.6 kg)  Height: 5\' 2"  (1.575 m)   Body mass index is 27.25 kg/m. Physical Exam Vitals and nursing note reviewed.  Constitutional:      Appearance: Normal appearance.  HENT:     Head: Normocephalic and atraumatic.     Mouth/Throat:     Mouth: Mucous membranes are moist.  Eyes:     Extraocular Movements: Extraocular movements intact.     Conjunctiva/sclera: Conjunctivae normal.     Pupils: Pupils are equal, round, and reactive to light.  Cardiovascular:     Rate and Rhythm: Normal rate and  regular rhythm.     Heart sounds: No murmur heard.   Pulmonary:     Breath sounds: No rales.  Abdominal:     General: Bowel sounds are normal.     Palpations: Abdomen is soft.     Tenderness: There is no abdominal tenderness.  Musculoskeletal:     Cervical back: Normal range of motion and neck supple.     Right lower leg: No edema.     Left lower leg: No edema.  Skin:    General: Skin is warm and dry.     Findings: No rash.  Neurological:     General: No focal deficit present.     Mental Status: She is alert. Mental status is at baseline.     Gait: Gait abnormal.     Comments: Oriented to self. Slightly weakness in RLE  Psychiatric:        Mood and Affect: Mood normal.        Behavior: Behavior normal.     Labs reviewed: Recent Labs    06/28/19 0000 09/20/19 0000 04/03/20 0000  NA 143 144 143  K 4.2 4.2 4.0  CL 108 107 106  CO2 26 29 29*  BUN 24* 33* 23*  CREATININE 0.8 0.9 1.0  CALCIUM 8.9 9.2 9.1   Recent Labs    06/28/19 0000 09/20/19 0000 04/03/20 0000  AST 11* 12* 13  ALT 15 16 15   ALKPHOS 60 72 70  PROT 5.4 5.9*  --   ALBUMIN 3.5 3.8 3.6   Recent Labs    06/28/19 0000 09/20/19 0000 04/03/20 0000  WBC 7.7 7.5 7.9  NEUTROABS  --   --  4,645  HGB 11.4* 12.0 11.4*  HCT 35* 38 35*  PLT 202 233 228    Lab Results  Component Value Date   TSH 2.39 04/03/2020   Lab Results  Component Value Date   HGBA1C 5.9 (H) 03/06/2019   Lab Results  Component Value Date   CHOL 131 04/03/2020   HDL 43 04/03/2020   LDLCALC 72 04/03/2020   TRIG 80 04/03/2020   CHOLHDL 6.3 03/06/2019    Significant Diagnostic Results in last 30 days:  No results found.  Assessment/Plan Stroke (cerebrum) (HCC) Continue Plavix, Atorvastatin.   GERD (gastroesophageal reflux disease) Stable, continue Pantoprazole.   Vascular dementia ([place]) Continue SNF [facility] for safety, care assistance, continue Memantine, Donepezil.   Anxiety Her mood is stable, continue Sertraline.   Weight gain Gradual weight gained, but no apparent fluid overload. Dietary f/u     Family/ staff Communication: plan of care reviewed with the patient and charge nurse.   Labs/tests ordered:  none  Time spend 25 minutes.

## 2020-06-11 ENCOUNTER — Encounter: Payer: Self-pay | Admitting: Nurse Practitioner

## 2020-06-11 NOTE — Assessment & Plan Note (Signed)
Stable, continue Pantoprazole.  

## 2020-06-11 NOTE — Assessment & Plan Note (Signed)
Gradual weight gained, but no apparent fluid overload. Dietary f/u

## 2020-06-11 NOTE — Assessment & Plan Note (Signed)
Continue SNF [facility] for safety, care assistance, continue Memantine, Donepezil.

## 2020-06-11 NOTE — Assessment & Plan Note (Signed)
Continue Plavix, Atorvastatin.

## 2020-06-11 NOTE — Assessment & Plan Note (Signed)
Her mood is stable, continue Sertraline.  

## 2020-06-12 DIAGNOSIS — M810 Age-related osteoporosis without current pathological fracture: Secondary | ICD-10-CM | POA: Diagnosis not present

## 2020-06-12 DIAGNOSIS — Z8781 Personal history of (healed) traumatic fracture: Secondary | ICD-10-CM | POA: Diagnosis not present

## 2020-06-12 DIAGNOSIS — R2681 Unsteadiness on feet: Secondary | ICD-10-CM | POA: Diagnosis not present

## 2020-06-12 DIAGNOSIS — F0390 Unspecified dementia without behavioral disturbance: Secondary | ICD-10-CM | POA: Diagnosis not present

## 2020-06-12 DIAGNOSIS — R278 Other lack of coordination: Secondary | ICD-10-CM | POA: Diagnosis not present

## 2020-06-12 DIAGNOSIS — R2689 Other abnormalities of gait and mobility: Secondary | ICD-10-CM | POA: Diagnosis not present

## 2020-06-12 DIAGNOSIS — M6281 Muscle weakness (generalized): Secondary | ICD-10-CM | POA: Diagnosis not present

## 2020-07-31 ENCOUNTER — Non-Acute Institutional Stay (SKILLED_NURSING_FACILITY): Payer: PPO | Admitting: Internal Medicine

## 2020-07-31 ENCOUNTER — Encounter: Payer: Self-pay | Admitting: Internal Medicine

## 2020-07-31 DIAGNOSIS — E7849 Other hyperlipidemia: Secondary | ICD-10-CM

## 2020-07-31 DIAGNOSIS — I10 Essential (primary) hypertension: Secondary | ICD-10-CM

## 2020-07-31 DIAGNOSIS — F0150 Vascular dementia without behavioral disturbance: Secondary | ICD-10-CM | POA: Diagnosis not present

## 2020-07-31 DIAGNOSIS — R269 Unspecified abnormalities of gait and mobility: Secondary | ICD-10-CM | POA: Diagnosis not present

## 2020-07-31 DIAGNOSIS — I6302 Cerebral infarction due to thrombosis of basilar artery: Secondary | ICD-10-CM

## 2020-07-31 DIAGNOSIS — F424 Excoriation (skin-picking) disorder: Secondary | ICD-10-CM

## 2020-07-31 NOTE — Progress Notes (Signed)
A user error has taken place.

## 2020-07-31 NOTE — Progress Notes (Signed)
Location:  [facility] Room Number: 18-A Place of Service:  SNF [phone]) Provider:  [name], MD  Patient Care Team: [name], MD as PCP - General (Internal Medicine) Ngetich, Donalee Citrin, NP as Nurse Practitioner [facility] Medicine)  Extended Emergency Contact Information Primary Emergency Contact: Gordan,[place] Address: [address]          [place], [place] [address] [place] of [place] Home Phone: [phone] Relation: Daughter Secondary Emergency Contact: Iven Finn Address: [address]          [place], [place] [address] [place] Home Phone: [phone] Relation: Spouse  Code Status:  DNR Goals of care: Advanced Directive information Advanced Directives 05/26/2020  Does Patient Have a Medical Advance Directive? Yes  Type of Estate agent of [place];Living will;Out of facility DNR (pink MOST or yellow form)  Does patient want to make changes to medical advance directive? No - Patient declined  Copy of Healthcare Power of Attorney in Chart? Yes - validated most recent copy scanned in chart (See row information)  Pre-existing out of facility DNR order (yellow form or pink MOST form) Yellow form placed in chart (order not valid for inpatient use)     Chief Complaint  Patient presents with   Medical Management of Chronic Issues    Routine [facility] visit   Quality Metric Gaps    There is no documented DEXA scan.     HPI:  Pt is a 86 y.o. female seen today for medical management of chronic diseases.    Patient has history of dementia, anxiety,history of right humerus fracture, hypertension and hyperlipidemia. History of  Acute CVAMRI showed Acute infarction on Left Para median Pons  Patient is stable in the Facility No New Issues  Wants to know if she can get her Toe Nails Trimmed. Stays mostly in her Bed No Recent Falls Weight is stable Continues to have area in her Neck and face  where she has been picking on   Past Medical History:  Diagnosis Date   Allergic rhinitis    Anxiety    Balance problem 04/28/2015   Fatigue 04/28/2015   Hearing loss 04/28/2015   history of Fracture of right humerus 04/28/2015   HLD (hyperlipidemia) 03/05/2019   Hyperglycemia    Hyperlipidemia    Hypertension    Memory loss 04/28/2015   04/26/2014 MMSE 28/30. Failed clock drawing. 04/21/15 MMSE 21/30. Failed clock drawing.    Osteoporosis    Past Surgical History:  Procedure Laterality Date   ABDOMINAL HYSTERECTOMY     CATARACT EXTRACTION  2010   ELBOW SURGERY Right 1988   FEMUR FRACTURE SURGERY Right 06/2014   [place], [place] Dr. Dione Housekeeper   HUMERUS FRACTURE SURGERY Right 2015   [place], [place] Dr. Eber Hong   TONSILLECTOMY  [phone]    Allergies  Allergen Reactions   Ace Inhibitors Other (See Comments)    Unknown reaction   Almond Oil Rash   Plum Pulp Rash    Outpatient Encounter Medications as of 07/31/2020  Medication Sig   amLODipine (NORVASC) 5 MG tablet Take 5 mg by mouth daily.    atorvastatin (LIPITOR) 40 MG tablet Take 1 tablet (40 mg total) by mouth daily at 6 PM.   calcium carbonate (OSCAL) 1500 (600 Ca) MG TABS tablet Take 600 mg of elemental calcium by mouth daily with breakfast.   Chloroxylenol, Antiseptic, (PREP PROTECTIVE SKIN BARRIER EX) Apply topically. Apply skin prep to heels  twice a day   Cholecalciferol (VITAMIN D3) 1000 UNITS CAPS Take 1,000 Units daily by mouth.    clopidogrel (PLAVIX) 75 MG tablet Take 1 tablet (75 mg total) by mouth daily.   donepezil (ARICEPT) 10 MG tablet TAKE ONE TABLET BY MOUTH DAILY TO PRESERVE MEMORY   memantine (NAMENDA) 10 MG tablet Take 1 tablet (10 mg total) by mouth 2 (two) times daily.   Omega-3 Fatty Acids (FISH OIL) 500 MG CAPS Take 1 capsule by mouth daily.   pantoprazole (PROTONIX) 40 MG tablet Take 1 tablet (40 mg total) by mouth daily.   sertraline (ZOLOFT) 25 MG tablet Take 25 mg  by mouth daily.   triamcinolone cream (KENALOG) 0.1 % Apply 1 application topically 2 (two) times daily as needed.   zinc oxide 20 % ointment Apply 1 application topically as needed for irritation. Apply to peri/buttocks after every incontinent episode   No facility-administered encounter medications on file as of 07/31/2020.    Review of Systems  Unable to perform ROS: Dementia    Immunization History  Administered Date(s) Administered   DTaP 05/12/2014   Influenza, High Dose Seasonal PF 09/21/2017, 09/27/2019   Influenza,inj,Quad PF,6+ Mos 09/14/2018   Influenza-Unspecified 10/07/2014, 09/11/2015, 09/23/2016   Moderna SARS-COVID-2 Vaccination 12/17/2019, 01/14/2020   PPD Test 10/16/2014   Pneumococcal Conjugate-13 10/20/2017   Pneumococcal Polysaccharide-23 08/20/2011   Pertinent  Health Maintenance Due  Topic Date Due   DEXA SCAN  Never done   INFLUENZA VACCINE  07/13/2020   PNA vac Low Risk Adult  Completed   Fall Risk  11/21/2018 10/14/2017 02/17/2016 01/06/2015  Falls in the past year? 0 No No Yes  Comment - - - fx (R) humerus; fx (R) femur  Number falls in past yr: 0 - - 2 or more  Injury with Fall? 0 - - -  Risk for fall due to : - - - History of fall(s)   Functional Status Survey:    Vitals:   07/31/20 1449 07/31/20 1452  BP: (!) 150/90 (!) 146/78  Pulse: 75   Resp: 20   Temp: (!) 97.4 F (36.3 C)   TempSrc: Oral   SpO2: 95%   Weight: 145 lb 9.6 oz (66 kg)   Height: 5\' 2"  (1.575 m)    Body mass index is 26.63 kg/m. Physical Exam  Constitutional: . Well-developed and well-nourished.  HENT:  Head: Normocephalic.  Mouth/Throat: Oropharynx is clear and moist.  Eyes: Pupils are equal, round, and reactive to light.  Neck: Neck supple.  Cardiovascular: Normal rate and normal heart sounds.  No murmur heard. Pulmonary/Chest: Effort normal and breath sounds normal. No respiratory distress. No wheezes. She has no rales.  Abdominal: Soft. Bowel  sounds are normal. No distension. There is no tenderness. There is no rebound.  Musculoskeletal: No edema.  Lymphadenopathy: none Neurological: Oriented to Self Follows Commands .  Skin: Skin is warm and dry. Has Vesicles wehre she has been Picking her skin Psychiatric: Normal mood and affect. Behavior is normal. Thought content normal.    Labs reviewed: Recent Labs    09/20/19 0000 04/03/20 0000  NA 144 143  K 4.2 4.0  CL 107 106  CO2 29 29*  BUN 33* 23*  CREATININE 0.9 1.0  CALCIUM 9.2 9.1   Recent Labs    09/20/19 0000 04/03/20 0000  AST 12* 13  ALT 16 15  ALKPHOS 72 70  PROT 5.9*  --   ALBUMIN 3.8 3.6   Recent Labs  09/20/19 0000 04/03/20 0000  WBC 7.5 7.9  NEUTROABS  --  4,645  HGB 12.0 11.4*  HCT 38 35*  PLT 233 228   Lab Results  Component Value Date   TSH 2.39 04/03/2020   Lab Results  Component Value Date   HGBA1C 5.9 (H) 03/06/2019   Lab Results  Component Value Date   CHOL 131 04/03/2020   HDL 43 04/03/2020   LDLCALC 72 04/03/2020   TRIG 80 04/03/2020   CHOLHDL 6.3 03/06/2019    Significant Diagnostic Results in last 30 days:  No results found.  Assessment/Plan Essential hypertension Continue on Norvasc Cerebrovascular accident (CVA) due to thrombosis of basilar artery (HCC) ON Plavix and statin Vascular dementia without behavioral disturbance (HCC) Continue Supportive care On Aricept and Namenda Skin picking habit Continue Supportive care Other hyperlipidemia On Statin LDL 72 Gait abnormality Supportive care Osteoporosis  Not candidate for any Stronger treatment Continue Calcium and Vit D Check the level GERD On Prilosec Depression On Zoloft Big Toe Nails Refer to Podiatry  Family/ staff Communication:   Labs/tests ordered:

## 2020-08-16 IMAGING — MR MRI HEAD WITHOUT CONTRAST
12 of 14 series · 36 of 48 positions shown · non-contrast
Comparison: Head CT earlier same day

CLINICAL DATA: Focal neurological deficit. Right sided weakness.
Slurred speech.

EXAM:
MRI HEAD WITHOUT CONTRAST
MRA HEAD WITHOUT CONTRAST
TECHNIQUE: Multiplanar, multiecho pulse sequences of the brain and surrounding
structures were obtained without intravenous contrast. Angiographic
images of the head were obtained using MRA technique without
contrast.

[Series 5: DWI · axial · 3.0mm · 0.88mm/px · z∈[-41,+107]mm · 6 of 104 slices shown (1 of 4)]
[im 1/104]
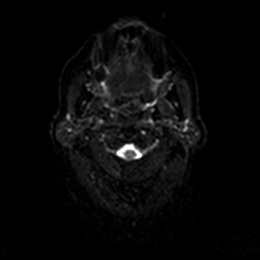
[im 21/104]
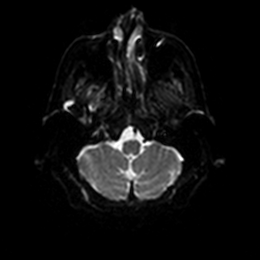
[im 42/104]
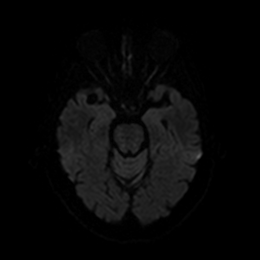
[im 62/104]
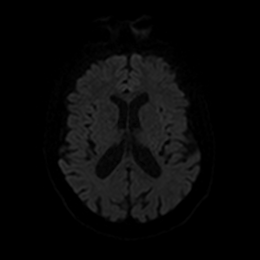
[im 83/104]
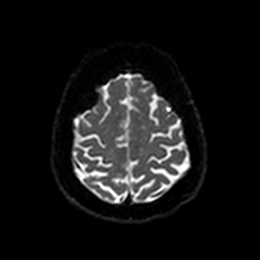
[im 104/104]
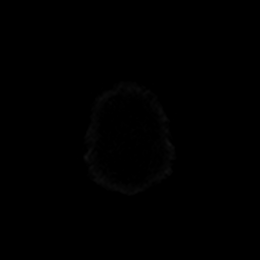

[Series 6: DWI · axial · 3.0mm · 0.88mm/px · z∈[-41,+107]mm · 3 of 52 slices shown (2 of 4)]
[im 1/52]
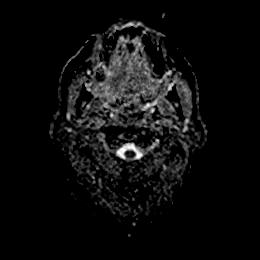
[im 26/52]
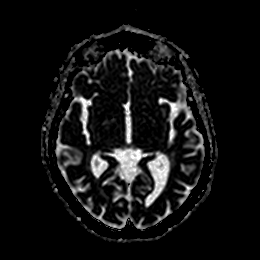
[im 52/52]
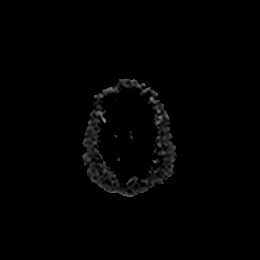

[Series 11: DWI · coronal · 4.0mm · 0.88mm/px · 4 of 72 slices shown (3 of 4)]
[im 1/72]
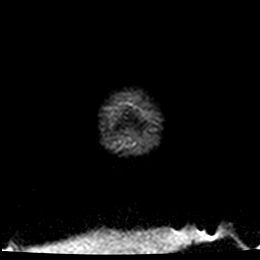
[im 24/72]
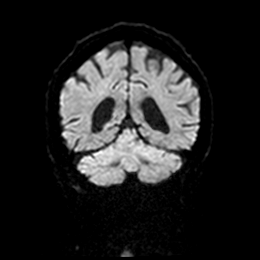
[im 48/72]
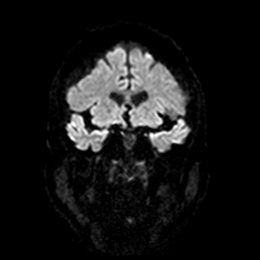
[im 72/72]
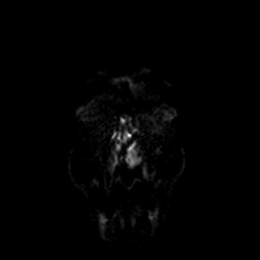

[Series 12: DWI · coronal · 4.0mm · 0.88mm/px · 2 of 36 slices shown (4 of 4)]
[im 1/36]
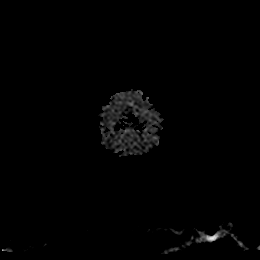
[im 36/36]
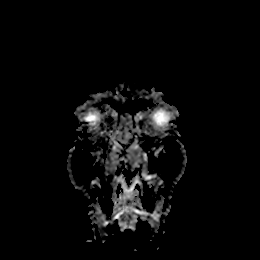

[Series 13: T1 · sagittal · 5.0mm · 0.75mm/px · 1 of 20 slices shown]
[im 1/20]
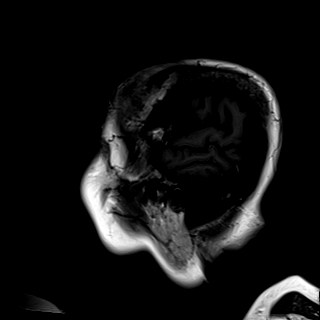

[Series 14: T2 · axial · 5.0mm · 0.72mm/px · z∈[-52,+90]mm · 2 of 25 slices shown (1 of 2)]
[im 1/25]
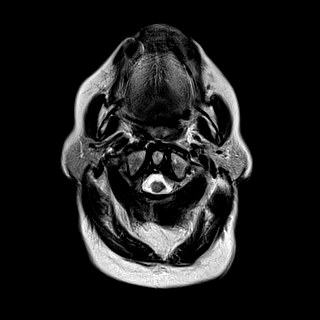
[im 25/25]
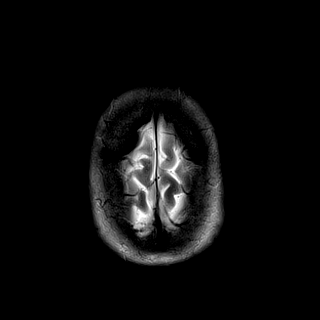

[Series 15: FLAIR · axial · 5.0mm · 0.45mm/px · z∈[-52,+90]mm · 2 of 25 slices shown]
[im 1/25]
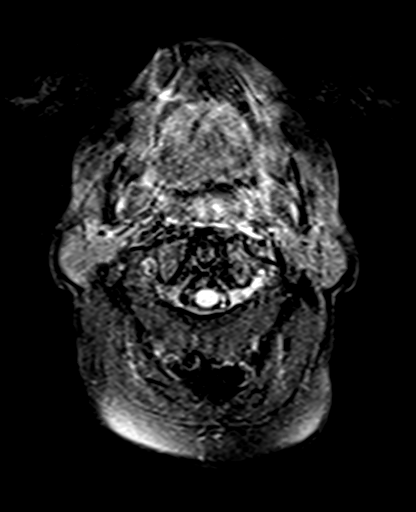
[im 25/25]
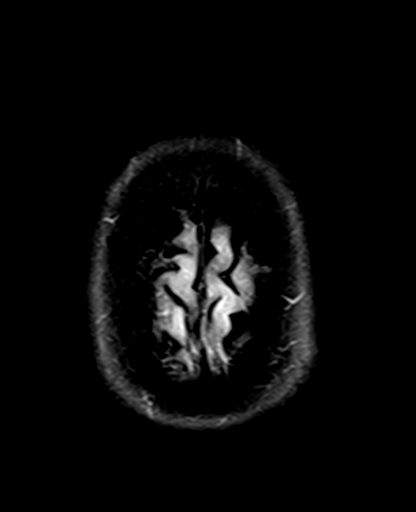

[Series 16: mag_images · axial · 3.0mm · 0.90mm/px · z∈[-59,+115]mm · 4 of 60 slices shown]
[im 1/60]
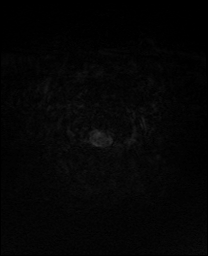
[im 20/60]
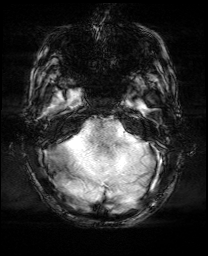
[im 40/60]
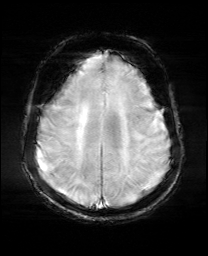
[im 60/60]
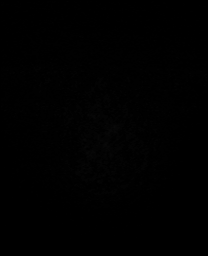

[Series 17: pha_images · axial · 3.0mm · 0.90mm/px · z∈[-53,+112]mm · 3 of 56 slices shown]
[im 1/56]
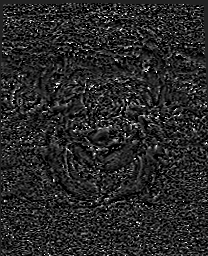
[im 28/56]
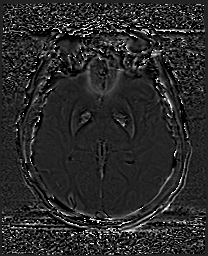
[im 56/56]
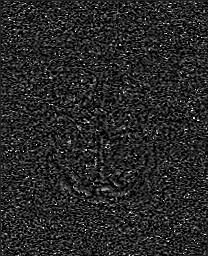

[Series 18: swi_images · axial · 3.0mm · 0.90mm/px · z∈[-59,+115]mm · 4 of 60 slices shown]
[im 1/60]
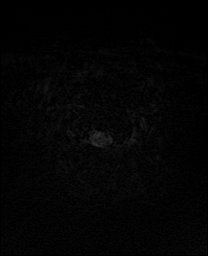
[im 20/60]
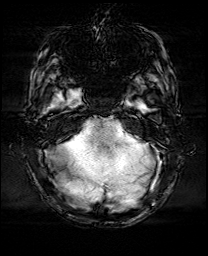
[im 40/60]
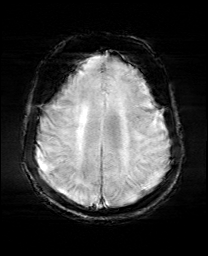
[im 60/60]
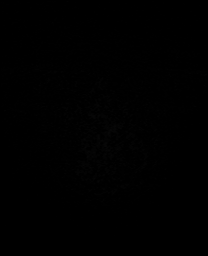

[Series 19: mip_images(sw) · axial · 24.0mm · 0.90mm/px · z∈[-48,+105]mm · 3 of 53 slices shown]
[im 1/53]
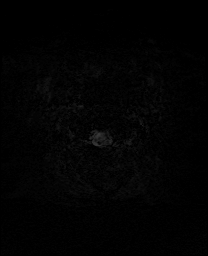
[im 27/53]
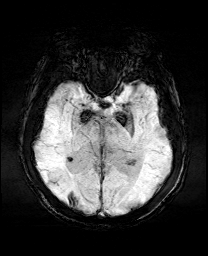
[im 53/53]
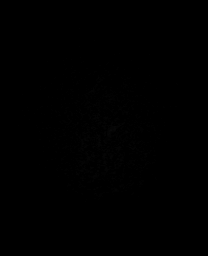

[Series 21: T2 · coronal · 5.0mm · 0.34mm/px · 2 of 26 slices shown (2 of 2)]
[im 1/26]
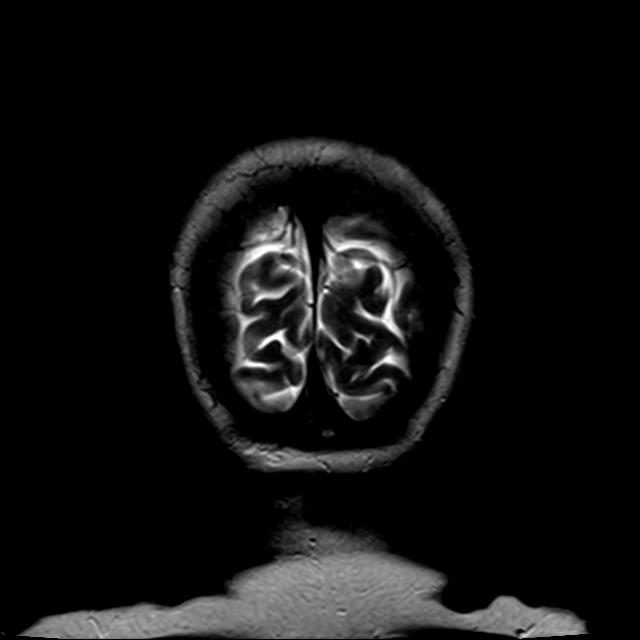
[im 26/26]
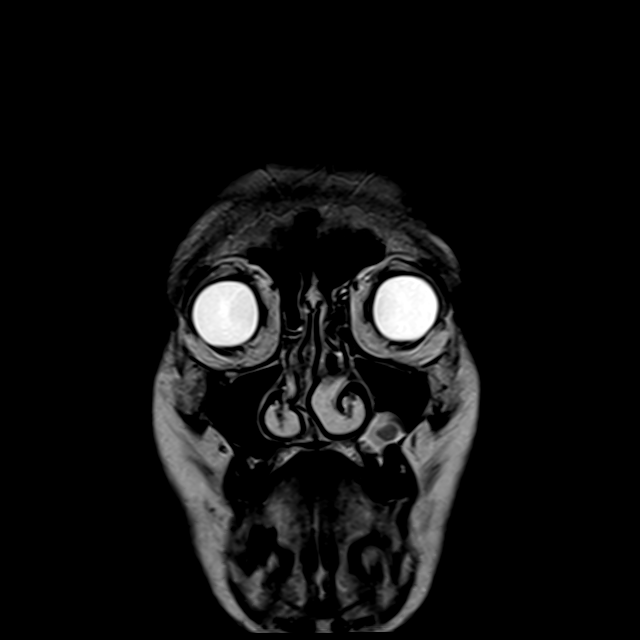

[36 of 48 positions shown; findings below may reference images not displayed]

FINDINGS: MRI HEAD FINDINGS

Brain: Acute infarction of the left para median pons. No other acute
infarction. No swelling or hemorrhage. Elsewhere, there chronic
small-vessel ischemic changes of the pons. No focal cerebellar
insult. Cerebral hemispheres show moderate chronic small-vessel
ischemic changes of the deep and subcortical white matter. No mass
lesion, acute hemorrhage, hydrocephalus or extra-axial collection.
There is some old hemosiderin deposition along the surface of the
brain in the right parieto-occipital region.

Vascular: Major vessels at the base of the brain show flow.

Skull and upper cervical spine: Negative

Sinuses/Orbits: Clear/normal

Other: None

MRA HEAD FINDINGS

Both internal carotid arteries are patent through the skull base and
siphon regions. No flow limiting siphon stenosis suspected. The
anterior and middle cerebral vessels are patent. There are stenoses
in the MCA bifurcation regions, worse on the left than the right.
Mild stenoses are seen in the A1 segments.

Both vertebral arteries are patent to the basilar. There is mild
atherosclerotic change of the basilar artery. There is a patent left
posterior inferior cerebellar artery. There is a patent right
anterior inferior cerebellar artery. Both superior cerebellar
arteries show flow. Both posterior cerebral arteries are patent,
though there are serial stenoses, with diminished visualization of
the more distal branch vessels.
IMPRESSION: Acute infarction of the left para median pons. No swelling or
hemorrhage in that location.

Chronic small-vessel ischemic changes elsewhere within the pons and
within the hemispheric white matter.

MR angiography shows extensive intracranial atherosclerotic disease,
with stenoses in the MCA bifurcation regions left more than right
and of the posterior cerebral arteries on both sides. No large
vessel occlusion identified.

## 2020-08-22 ENCOUNTER — Telehealth: Payer: Self-pay

## 2020-08-22 NOTE — Telephone Encounter (Signed)
Medical review of medication given to [facility] Banker) @ [phone] [employer]

## 2020-09-12 ENCOUNTER — Encounter: Payer: Self-pay | Admitting: Nurse Practitioner

## 2020-09-12 ENCOUNTER — Non-Acute Institutional Stay (SKILLED_NURSING_FACILITY): Payer: PPO | Admitting: Nurse Practitioner

## 2020-09-12 DIAGNOSIS — I6302 Cerebral infarction due to thrombosis of basilar artery: Secondary | ICD-10-CM

## 2020-09-12 DIAGNOSIS — I10 Essential (primary) hypertension: Secondary | ICD-10-CM | POA: Diagnosis not present

## 2020-09-12 DIAGNOSIS — K219 Gastro-esophageal reflux disease without esophagitis: Secondary | ICD-10-CM

## 2020-09-12 DIAGNOSIS — F0150 Vascular dementia without behavioral disturbance: Secondary | ICD-10-CM

## 2020-09-12 DIAGNOSIS — F419 Anxiety disorder, unspecified: Secondary | ICD-10-CM | POA: Diagnosis not present

## 2020-09-12 NOTE — Assessment & Plan Note (Signed)
Her mood is stable, continue Sertraline.  

## 2020-09-12 NOTE — Progress Notes (Signed)
Location:   [facility] Room Number: 18 Place of Service:  SNF (31) Provider: [facility] [name] NP  [name], MD  Patient Care Team: [name], MD as PCP - General (Internal Medicine) Ngetich, Donalee Citrin, NP as Nurse Practitioner [facility] Medicine)  Extended Emergency Contact Information Primary Emergency Contact: Gordan,[place] Address: [address]          [place], [place] [address] [place] of [place] Home Phone: [phone] Relation: Daughter Secondary Emergency Contact: Iven Finn Address: [address]          [place], [place] [address] [place] of [place] Home Phone: [phone] Relation: Spouse  Code Status:  DNR Goals of care: Advanced Directive information Advanced Directives 09/12/2020  Does Patient Have a Medical Advance Directive? Yes  Type of Advance Directive Out of facility DNR (pink MOST or yellow form);Healthcare Power of Attorney  Does patient want to make changes to medical advance directive? No - Patient declined  Copy of Healthcare Power of Attorney in Chart? Yes - validated most recent copy scanned in chart (See row information)  Pre-existing out of facility DNR order (yellow form or pink MOST form) Yellow form placed in chart (order not valid for inpatient use)     Chief Complaint  Patient presents with   Medical Management of Chronic Issues    Routine follow up visit.   Best Practice Recommendations    Flu vaccine   Quality Metric Gaps    DEXA scan    HPI:  Pt is a 86 y.o. female seen today for medical management of chronic diseases.    GERD, stable, on Pantoprazole 40mg  qd.              HTN, takes  amlodipine 5mg , Atorvastatin 40mg  qd  Dementia, an resident of SNF [facility], takes Menmantine 10mg  bid, Donepezil 10mg  qd for memory             Her mood is stable, on Sertraline 25mg  qd.   Hx of CVA, thrombosis of basilar artery, takes Plavix, statin   Past Medical History:  Diagnosis Date   Allergic rhinitis     Anxiety    Balance problem 04/28/2015   Fatigue 04/28/2015   Hearing loss 04/28/2015   history of Fracture of right humerus 04/28/2015   HLD (hyperlipidemia) 03/05/2019   Hyperglycemia    Hyperlipidemia    Hypertension    Memory loss 04/28/2015   04/26/2014 MMSE 28/30. Failed clock drawing. 04/21/15 MMSE 21/30. Failed clock drawing.    Osteoporosis    Past Surgical History:  Procedure Laterality Date   ABDOMINAL HYSTERECTOMY     CATARACT EXTRACTION  2010   ELBOW SURGERY Right 1988   FEMUR FRACTURE SURGERY Right 06/2014   P[place]eniz, 03/07/2019 Dr. 04/30/2015   HUMERUS FRACTURE SURGERY Right 2015   [place], 04/28/2014 Dr. 06/21/15   TONSILLECTOMY  [id number]    Allergies  Allergen Reactions   Ace Inhibitors Other (See Comments)    Unknown reaction   Almond Oil Rash   Plum Pulp Rash    Allergies as of 09/12/2020      Reactions   Ace Inhibitors Other (See Comments)   Unknown reaction   Almond Oil Rash   Plum Pulp Rash      Medication List       Accurate as of September 12, 2020 11:59 PM. If you have any questions, ask your nurse or doctor.        STOP taking these medications  PREP PROTECTIVE SKIN BARRIER EX Stopped by: Marea Reasner X Ericia Moxley, NP     TAKE these medications   amLODipine 5 MG tablet Commonly known as: NORVASC Take 5 mg by mouth daily.   atorvastatin 40 MG tablet Commonly known as: LIPITOR Take 1 tablet (40 mg total) by mouth daily at 6 PM.   calcium carbonate 1500 (600 Ca) MG Tabs tablet Commonly known as: OSCAL Take 600 mg of elemental calcium by mouth daily with breakfast.   clopidogrel 75 MG tablet Commonly known as: PLAVIX Take 1 tablet (75 mg total) by mouth daily.   donepezil 10 MG tablet Commonly known as: ARICEPT TAKE ONE TABLET BY MOUTH DAILY TO PRESERVE MEMORY   Fish Oil 500 MG Caps Take 1 capsule by mouth daily.   memantine 10 MG tablet Commonly known as: Namenda Take 1 tablet (10 mg total) by mouth 2 (two) times daily.     pantoprazole 40 MG tablet Commonly known as: Protonix Take 1 tablet (40 mg total) by mouth daily.   sertraline 25 MG tablet Commonly known as: ZOLOFT Take 25 mg by mouth daily.   triamcinolone cream 0.1 % Commonly known as: KENALOG Apply 1 application topically 2 (two) times daily as needed.   Vitamin D3 25 MCG (1000 UT) Caps Take 1,000 Units daily by mouth.   zinc oxide 20 % ointment Apply 1 application topically as needed for irritation. Apply to peri/buttocks after every incontinent episode       Review of Systems  Constitutional: Negative for activity change, fever and unexpected weight change.       Gradual weight gained.   HENT: Positive for hearing loss. Negative for congestion and voice change.   Eyes: Negative for visual disturbance.  Respiratory: Negative for cough.   Cardiovascular: Negative for leg swelling.  Gastrointestinal: Negative for abdominal pain and constipation.  Genitourinary: Negative for difficulty urinating, dysuria and urgency.  Musculoskeletal: Positive for arthralgias and gait problem.  Skin: Negative for color change.  Neurological: Negative for dizziness and speech difficulty.       Dementia.   Psychiatric/Behavioral: Positive for confusion. Negative for sleep disturbance. The patient is not nervous/anxious.     Immunization History  Administered Date(s) Administered   DTaP 05/12/2014   Influenza, High Dose Seasonal PF 09/21/2017, 09/27/2019   Influenza,inj,Quad PF,6+ Mos 09/14/2018   Influenza-Unspecified 10/07/2014, 09/11/2015, 09/23/2016   Moderna SARS-COVID-2 Vaccination 12/17/2019, 01/14/2020   PPD Test 10/16/2014   Pneumococcal Conjugate-13 10/20/2017   Pneumococcal Polysaccharide-23 08/20/2011   Pertinent  Health Maintenance Due  Topic Date Due   DEXA SCAN  Never done   INFLUENZA VACCINE  07/13/2020   PNA vac Low Risk Adult  Completed   Fall Risk  11/21/2018 10/14/2017 02/17/2016 01/06/2015  Falls in the past year?  0 No No Yes  Comment - - - fx (R) humerus; fx (R) femur  Number falls in past yr: 0 - - 2 or more  Injury with Fall? 0 - - -  Risk for fall due to : - - - History of fall(s)   Functional Status Survey:    Vitals:   09/12/20 1433  BP: 136/84  Pulse: 82  Resp: 20  Temp: (!) 97.4 F (36.3 C)  SpO2: 95%  Weight: 145 lb 3.2 oz (65.9 kg)  Height: 5\' 2"  (1.575 m)   Body mass index is 26.56 kg/m. Physical Exam Vitals and nursing note reviewed.  Constitutional:      Appearance: Normal appearance.  HENT:  Head: Normocephalic and atraumatic.  Eyes:     Extraocular Movements: Extraocular movements intact.     Conjunctiva/sclera: Conjunctivae normal.     Pupils: Pupils are equal, round, and reactive to light.  Cardiovascular:     Rate and Rhythm: Normal rate and regular rhythm.     Heart sounds: No murmur heard.   Pulmonary:     Breath sounds: No rales.  Abdominal:     General: Bowel sounds are normal.     Palpations: Abdomen is soft.     Tenderness: There is no abdominal tenderness.  Musculoskeletal:     Cervical back: Normal range of motion and neck supple.     Right lower leg: No edema.     Left lower leg: No edema.  Skin:    General: Skin is warm and dry.     Findings: No rash.  Neurological:     General: No focal deficit present.     Mental Status: She is alert. Mental status is at baseline.     Gait: Gait abnormal.     Comments: Oriented to self. Slightly weakness in RLE  Psychiatric:        Mood and Affect: Mood normal.        Behavior: Behavior normal.     Labs reviewed: Recent Labs    09/20/19 0000 04/03/20 0000  NA 144 143  K 4.2 4.0  CL 107 106  CO2 29 29*  BUN 33* 23*  CREATININE 0.9 1.0  CALCIUM 9.2 9.1   Recent Labs    09/20/19 0000 04/03/20 0000  AST 12* 13  ALT 16 15  ALKPHOS 72 70  PROT 5.9*  --   ALBUMIN 3.8 3.6   Recent Labs    09/20/19 0000 04/03/20 0000  WBC 7.5 7.9  NEUTROABS  --  4,645  HGB 12.0 11.4*  HCT 38 35*   PLT 233 228   Lab Results  Component Value Date   TSH 2.39 04/03/2020   Lab Results  Component Value Date   HGBA1C 5.9 (H) 03/06/2019   Lab Results  Component Value Date   CHOL 131 04/03/2020   HDL 43 04/03/2020   LDLCALC 72 04/03/2020   TRIG 80 04/03/2020   CHOLHDL 6.3 03/06/2019    Significant Diagnostic Results in last 30 days:  No results found.  Assessment/Plan GERD (gastroesophageal reflux disease) Stable, continue Pantoprazole.   Hypertension Blood pressure is controlled, continue Amlodipine.   Stroke (cerebrum) (HCC) Hx of CVA, thrombosis of basilar artery, takes Plavix, statin    Vascular dementia (HCC) No behavioral issues, continue supportive care in SNF [facility], continue Memantine, Donepezil.   Anxiety Her mood is stable, continue Sertraline.    Family/ staff Communication: plan of care reviewed with the patient and charge nurse.   Labs/tests ordered:  None  Time spend 35 minutes.

## 2020-09-12 NOTE — Assessment & Plan Note (Signed)
Stable, continue Pantoprazole.  

## 2020-09-12 NOTE — Assessment & Plan Note (Signed)
No behavioral issues, continue supportive care in SNF [facility], continue Memantine, Donepezil.

## 2020-09-12 NOTE — Assessment & Plan Note (Signed)
Hx of CVA, thrombosis of basilar artery, takes Plavix, statin

## 2020-09-12 NOTE — Assessment & Plan Note (Signed)
Blood pressure is controlled, continue Amlodipine.  

## 2020-09-15 ENCOUNTER — Encounter: Payer: Self-pay | Admitting: Nurse Practitioner

## 2020-10-17 ENCOUNTER — Non-Acute Institutional Stay (SKILLED_NURSING_FACILITY): Payer: PPO | Admitting: Nurse Practitioner

## 2020-10-17 ENCOUNTER — Encounter: Payer: Self-pay | Admitting: Nurse Practitioner

## 2020-10-17 DIAGNOSIS — K219 Gastro-esophageal reflux disease without esophagitis: Secondary | ICD-10-CM

## 2020-10-17 DIAGNOSIS — I6302 Cerebral infarction due to thrombosis of basilar artery: Secondary | ICD-10-CM | POA: Diagnosis not present

## 2020-10-17 DIAGNOSIS — F419 Anxiety disorder, unspecified: Secondary | ICD-10-CM | POA: Diagnosis not present

## 2020-10-17 DIAGNOSIS — I10 Essential (primary) hypertension: Secondary | ICD-10-CM

## 2020-10-17 DIAGNOSIS — F0150 Vascular dementia without behavioral disturbance: Secondary | ICD-10-CM | POA: Diagnosis not present

## 2020-10-17 NOTE — Assessment & Plan Note (Signed)
Hx of CVA, thrombosis of basilar artery, takes Plavix, statin, w/c for mobility

## 2020-10-17 NOTE — Assessment & Plan Note (Signed)
stable, on Pantoprazole 40mg qd.  

## 2020-10-17 NOTE — Assessment & Plan Note (Signed)
HTN, takesamlodipine 5mg , Atorvastatin 40mg  qd

## 2020-10-17 NOTE — Assessment & Plan Note (Signed)
Ana [name] is stable, on Sertraline 25mg  qd.

## 2020-10-17 NOTE — Progress Notes (Signed)
Location:   [facility] Room Number: 18 Place of Service:  SNF (31) Provider: Chipper Oman NP    Patient Care Team: [name], MD as PCP - General (Internal Medicine) Ngetich, Donalee Citrin, NP as Nurse Practitioner [facility] Medicine)  Extended Emergency Contact Information Primary Emergency Contact: Gordan,[place] Address: [address]          [place], [place] [address] [place] of [place] Home Phone: [phone] Relation: Daughter Secondary Emergency Contact: Iven Finn Address: [address]          [place], [place] [address] [place] of [place] Home Phone: [phone] Relation: Spouse  Code Status:  DNR Goals of care: Advanced Directive information Advanced Directives 10/17/2020  Does Patient Have a Medical Advance Directive? Yes  Type of Advance Directive Out of facility DNR (pink MOST or yellow form);Healthcare Power of Attorney  Does patient want to make changes to medical advance directive? No - Patient declined  Copy of Healthcare Power of Attorney in Chart? Yes - validated most recent copy scanned in chart (See row information)  Pre-existing out of facility DNR order (yellow form or pink MOST form) Yellow form placed in chart (order not valid for inpatient use)     Chief Complaint  Patient presents with  . Medical Management of Chronic Issues    Routine Visit   . Health Maintenance    Discuss the need for Dexa Scan     HPI:  Pt is a 86 y.o. female seen today for medical management of chronic diseases.    GERD, stable, on Pantoprazole 40mg  qd.  HTN, takesamlodipine 5mg , Atorvastatin 40mg  qd             Dementia, an resident of SNF [facility], takesMenmantine 10mg  bid, Donepezil 10mg  qd for memory Hermood is stable, on Sertraline 25mg  qd.              Hx of CVA, thrombosis of basilar artery, takes Plavix, statin, slightly RLE weakness.   Past Medical History:  Diagnosis Date  . Allergic rhinitis   . Anxiety    . Balance problem 04/28/2015  . Fatigue 04/28/2015  . Hearing loss 04/28/2015  . history of Fracture of right humerus 04/28/2015  . HLD (hyperlipidemia) 03/05/2019  . Hyperglycemia   . Hyperlipidemia   . Hypertension   . Memory loss 04/28/2015   04/26/2014 MMSE 28/30. Failed clock drawing. 04/21/15 MMSE 21/30. Failed clock drawing.   . Osteoporosis    Past Surgical History:  Procedure Laterality Date  . ABDOMINAL HYSTERECTOMY    . CATARACT EXTRACTION  2010  . ELBOW SURGERY Right 1988  . FEMUR FRACTURE SURGERY Right 06/2014   [place]eniz, 03/07/2019 Dr. 04/30/2015  . HUMERUS FRACTURE SURGERY Right 2015   [place], [place] Dr. 04/28/2014  . TONSILLECTOMY  1935    Allergies  Allergen Reactions  . Ace Inhibitors Other (See Comments)    Unknown reaction  . Almond Oil Rash  . Plum Pulp Rash    Allergies as of 10/17/2020      Reactions   Ace Inhibitors Other (See Comments)   Unknown reaction   Almond Oil Rash   Plum Pulp Rash      Medication List       Accurate as of October 17, 2020 11:59 PM. If you have any questions, ask your nurse or doctor.        amLODipine 5 MG tablet Commonly known as: NORVASC Take 5 mg by mouth daily.   atorvastatin  40 MG tablet Commonly known as: LIPITOR Take 1 tablet (40 mg total) by mouth daily at 6 PM.   calcium carbonate 1500 (600 Ca) MG Tabs tablet Commonly known as: OSCAL Take 600 mg of elemental calcium by mouth daily with breakfast.   clopidogrel 75 MG tablet Commonly known as: PLAVIX Take 1 tablet (75 mg total) by mouth daily.   donepezil 10 MG tablet Commonly known as: ARICEPT TAKE ONE TABLET BY MOUTH DAILY TO PRESERVE MEMORY   Fish Oil 500 MG Caps Take 1 capsule by mouth daily.   memantine 10 MG tablet Commonly known as: Namenda Take 1 tablet (10 mg total) by mouth 2 (two) times daily.   pantoprazole 40 MG tablet Commonly known as: Protonix Take 1 tablet (40 mg total) by mouth daily.   sertraline 25 MG tablet Commonly  known as: ZOLOFT Take 25 mg by mouth daily.   triamcinolone cream 0.1 % Commonly known as: KENALOG Apply 1 application topically 2 (two) times daily as needed.   Vitamin D3 25 MCG (1000 UT) Caps Take 1,000 Units daily by mouth.   zinc oxide 20 % ointment Apply 1 application topically as needed for irritation. Apply to peri/buttocks after every incontinent episode       Review of Systems  Constitutional: Negative for fatigue, fever and unexpected weight change.       Gradual weight gained.   HENT: Positive for hearing loss. Negative for congestion and voice change.   Eyes: Negative for visual disturbance.  Respiratory: Negative for cough.   Cardiovascular: Negative for leg swelling.  Gastrointestinal: Negative for abdominal pain and constipation.  Genitourinary: Negative for dysuria and urgency.  Musculoskeletal: Positive for arthralgias and gait problem.  Skin: Negative for color change.  Neurological: Negative for speech difficulty and light-headedness.       Dementia.   Psychiatric/Behavioral: Positive for confusion. Negative for sleep disturbance. The patient is not nervous/anxious.     Immunization History  Administered Date(s) Administered  . DTaP 05/12/2014  . Influenza, High Dose Seasonal PF 09/21/2017, 09/27/2019, 09/16/2020  . Influenza,inj,Quad PF,6+ Mos 09/14/2018  . Influenza-Unspecified 10/07/2014, 09/11/2015, 09/23/2016  . Moderna SARS-COVID-2 Vaccination 12/17/2019, 01/14/2020  . PPD Test 10/16/2014  . Pneumococcal Conjugate-13 10/20/2017  . Pneumococcal Polysaccharide-23 08/20/2011   Pertinent  Health Maintenance Due  Topic Date Due  . DEXA SCAN  Never done  . INFLUENZA VACCINE  Completed  . PNA vac Low Risk Adult  Completed   Fall Risk  11/21/2018 10/14/2017 02/17/2016 01/06/2015  Falls in the past year? 0 No No Yes  Comment - - - fx (R) humerus; fx (R) femur  Number falls in past yr: 0 - - 2 or more  Injury with Fall? 0 - - -  Risk for fall due to :  - - - History of fall(s)   Functional Status Survey:    Vitals:   10/17/20 1633  BP: 138/71  Pulse: 69  Resp: 20  Temp: 97.6 F (36.4 C)  SpO2: 96%  Weight: 145 lb 3.2 oz (65.9 kg)  Height: 5\' 2"  (1.575 m)   Body mass index is 26.56 kg/m. Physical Exam Vitals and nursing note reviewed.  Constitutional:      Appearance: Normal appearance.  HENT:     Head: Normocephalic and atraumatic.  Eyes:     Extraocular Movements: Extraocular movements intact.     Conjunctiva/sclera: Conjunctivae normal.     Pupils: Pupils are equal, round, and reactive to light.  Cardiovascular:  Rate and Rhythm: Normal rate and regular rhythm.     Heart sounds: No murmur heard.   Pulmonary:     Breath sounds: No rales.  Abdominal:     General: Bowel sounds are normal.     Palpations: Abdomen is soft.     Tenderness: There is no abdominal tenderness.  Musculoskeletal:     Cervical back: Normal range of motion and neck supple.     Right lower leg: No edema.     Left lower leg: No edema.  Skin:    General: Skin is warm and dry.     Findings: No rash.  Neurological:     General: No focal deficit present.     Mental Status: She is alert. Mental status is at baseline.     Gait: Gait abnormal.     Comments: Oriented to self. Slightly weakness in RLE  Psychiatric:        Mood and Affect: Mood normal.        Behavior: Behavior normal.     Labs reviewed: Recent Labs    04/03/20 0000  NA 143  K 4.0  CL 106  CO2 29*  BUN 23*  CREATININE 1.0  CALCIUM 9.1   Recent Labs    04/03/20 0000  AST 13  ALT 15  ALKPHOS 70  ALBUMIN 3.6   Recent Labs    04/03/20 0000  WBC 7.9  NEUTROABS 4,645  HGB 11.4*  HCT 35*  PLT 228   Lab Results  Component Value Date   TSH 2.39 04/03/2020   Lab Results  Component Value Date   HGBA1C 5.9 (H) 03/06/2019   Lab Results  Component Value Date   CHOL 131 04/03/2020   HDL 43 04/03/2020   LDLCALC 72 04/03/2020   TRIG 80 04/03/2020    CHOLHDL 6.3 03/06/2019    Significant Diagnostic Results in last 30 days:  No results found.  Assessment/Plan Stroke (cerebrum) (HCC) Hx of CVA, thrombosis of basilar artery, takes Plavix, statin, w/c for mobility   Anxiety Hermood is stable, on Sertraline 25mg  qd.   Vascular dementia (HCC) Dementia, an resident of SNF [facility], takesMenmantine 10mg  bid, Donepezil 10mg  qd for memory   Hypertension HTN, takesamlodipine 5mg , Atorvastatin 40mg  qd   GERD (gastroesophageal reflux disease) stable, on Pantoprazole 40mg  qd.     Family/ staff Communication: plan of care reviewed with the patient and charge nurse.   Labs/tests ordered:  None  Time spend 35 minutes.

## 2020-10-17 NOTE — Assessment & Plan Note (Signed)
Dementia, an resident of SNF [facility], takesMenmantine 10mg  bid, Donepezil 10mg  qd for memory

## 2020-10-20 ENCOUNTER — Encounter: Payer: Self-pay | Admitting: Nurse Practitioner

## 2020-11-14 DIAGNOSIS — L602 Onychogryphosis: Secondary | ICD-10-CM | POA: Diagnosis not present

## 2020-11-14 DIAGNOSIS — L84 Corns and callosities: Secondary | ICD-10-CM | POA: Diagnosis not present

## 2020-11-14 DIAGNOSIS — M79672 Pain in left foot: Secondary | ICD-10-CM | POA: Diagnosis not present

## 2020-11-14 DIAGNOSIS — M79671 Pain in right foot: Secondary | ICD-10-CM | POA: Diagnosis not present

## 2020-12-10 ENCOUNTER — Non-Acute Institutional Stay (SKILLED_NURSING_FACILITY): Payer: PPO | Admitting: Internal Medicine

## 2020-12-10 ENCOUNTER — Encounter: Payer: Self-pay | Admitting: Internal Medicine

## 2020-12-10 DIAGNOSIS — F424 Excoriation (skin-picking) disorder: Secondary | ICD-10-CM

## 2020-12-10 DIAGNOSIS — K219 Gastro-esophageal reflux disease without esophagitis: Secondary | ICD-10-CM | POA: Diagnosis not present

## 2020-12-10 DIAGNOSIS — I10 Essential (primary) hypertension: Secondary | ICD-10-CM

## 2020-12-10 DIAGNOSIS — F0150 Vascular dementia without behavioral disturbance: Secondary | ICD-10-CM

## 2020-12-10 DIAGNOSIS — I6302 Cerebral infarction due to thrombosis of basilar artery: Secondary | ICD-10-CM

## 2020-12-10 DIAGNOSIS — E7849 Other hyperlipidemia: Secondary | ICD-10-CM | POA: Diagnosis not present

## 2020-12-10 NOTE — Progress Notes (Signed)
Location:    [facility] Room Number: 18 Place of Service:  SNF [phone]) Provider:  Einar Crow MD  [name], MD  Patient Care Team: [name], MD as PCP - General (Internal Medicine) Ngetich, Donalee Citrin, NP as Nurse Practitioner [facility] Medicine)  Extended Emergency Contact Information Primary Emergency Contact: Gordan,[place] Address: [address]          [place], [place] [address] [place] of [place] Home Phone: [phone] Relation: Daughter Secondary Emergency Contact: Iven Finn Address: [address]          [place], [place] [address] [place] Home Phone: [phone] Relation: Spouse  Code Status:  DNR Goals of care: Advanced Directive information Advanced Directives 10/17/2020  Does Patient Have a Medical Advance Directive? Yes  Type of Advance Directive Out of facility DNR (pink MOST or yellow form);Healthcare Power of Attorney  Does patient want to make changes to medical advance directive? No - Patient declined  Copy of Healthcare Power of Attorney in Chart? Yes - validated most recent copy scanned in chart (See row information)  Pre-existing out of facility DNR order (yellow form or pink MOST form) Yellow form placed in chart (order not valid for inpatient use)     Chief Complaint  Patient presents with  . Medical Management of Chronic Issues  . Quality Metric Gaps    Dexa scan    HPI:  Pt is a 86 y.o. female seen today for medical management of chronic diseases  Patient has history of dementia, anxiety,  hypertension and hyperlipidemia History ofAcute CVAMRI showed Acute infarction on Left Para median Pons   Her Weight is  Stable Continues to have Skin Picking areas in her neck and Arms But mood is good. No Nursing issues Wheelchair Bound and dependent for her ADLS  Past Medical History:  Diagnosis Date  . Allergic rhinitis   . Anxiety   . Balance problem 04/28/2015  . Fatigue 04/28/2015  .  Hearing loss 04/28/2015  . history of Fracture of right humerus 04/28/2015  . HLD (hyperlipidemia) 03/05/2019  . Hyperglycemia   . Hyperlipidemia   . Hypertension   . Memory loss 04/28/2015   04/26/2014 MMSE 28/30. Failed clock drawing. 04/21/15 MMSE 21/30. Failed clock drawing.   . Osteoporosis    Past Surgical History:  Procedure Laterality Date  . ABDOMINAL HYSTERECTOMY    . CATARACT EXTRACTION  2010  . ELBOW SURGERY Right 1988  . FEMUR FRACTURE SURGERY Right 06/2014   [place], [place] Dr. Dione Housekeeper  . HUMERUS FRACTURE SURGERY Right 2015   [place], [place] Dr. Eber Hong  . TONSILLECTOMY  1935    Allergies  Allergen Reactions  . Ace Inhibitors Other (See Comments)    Unknown reaction  . Almond Oil Rash  . Plum Pulp Rash    Allergies as of 12/10/2020      Reactions   Ace Inhibitors Other (See Comments)   Unknown reaction   Almond Oil Rash   Plum Pulp Rash      Medication List       Accurate as of December 10, 2020 10:47 AM. If you have any questions, ask your nurse or doctor.        amLODipine 5 MG tablet Commonly known as: NORVASC Take 5 mg by mouth daily.   atorvastatin 40 MG tablet Commonly known as: LIPITOR Take 1 tablet (40 mg total) by mouth daily at 6 PM.   calcium carbonate 1500 (600 Ca) MG  Tabs tablet Commonly known as: OSCAL Take 600 mg of elemental calcium by mouth daily with breakfast.   clopidogrel 75 MG tablet Commonly known as: PLAVIX Take 1 tablet (75 mg total) by mouth daily.   donepezil 10 MG tablet Commonly known as: ARICEPT TAKE ONE TABLET BY MOUTH DAILY TO PRESERVE MEMORY   Fish Oil 500 MG Caps Take 1 capsule by mouth daily.   memantine 10 MG tablet Commonly known as: Namenda Take 1 tablet (10 mg total) by mouth 2 (two) times daily.   pantoprazole 40 MG tablet Commonly known as: Protonix Take 1 tablet (40 mg total) by mouth daily.   sertraline 25 MG tablet Commonly known as: ZOLOFT Take 25 mg by mouth daily.    triamcinolone 0.1 % Commonly known as: KENALOG Apply 1 application topically 2 (two) times daily as needed.   Vitamin D3 25 MCG (1000 UT) Caps Take 1,000 Units daily by mouth.   zinc oxide 20 % ointment Apply 1 application topically as needed for irritation. Apply to peri/buttocks after every incontinent episode       Review of Systems  Unable to perform ROS: Dementia    Immunization History  Administered Date(s) Administered  . DTaP 05/12/2014  . Influenza, High Dose Seasonal PF 09/21/2017, 09/27/2019, 09/16/2020  . Influenza,inj,Quad PF,6+ Mos 09/14/2018  . Influenza-Unspecified 10/07/2014, 09/11/2015, 09/23/2016  . Moderna Sars-Covid-2 Vaccination 12/17/2019, 01/14/2020, 10/27/2020  . PPD Test 10/16/2014  . Pneumococcal Conjugate-13 10/20/2017  . Pneumococcal Polysaccharide-23 08/20/2011   Pertinent  Health Maintenance Due  Topic Date Due  . DEXA SCAN  Never done  . INFLUENZA VACCINE  Completed  . PNA vac Low Risk Adult  Completed   Fall Risk  11/21/2018 10/14/2017 02/17/2016 01/06/2015  Falls in the past year? 0 No No Yes  Comment - - - fx (R) humerus; fx (R) femur  Number falls in past yr: 0 - - 2 or more  Injury with Fall? 0 - - -  Risk for fall due to : - - - History of fall(s)   Functional Status Survey:    Vitals:   12/10/20 1039  BP: 137/74  Pulse: 69  Resp: 18  Temp: (!) 97.4 F (36.3 C)  SpO2: 97%  Weight: 144 lb 8 oz (65.5 kg)  Height: 5\' 2"  (1.575 m)   Body mass index is 26.43 kg/m. Physical Exam  Constitutional: . Well-developed and well-nourished.  HENT:  Head: Normocephalic.  Mouth/Throat: Oropharynx is clear and moist.  Eyes: Pupils are equal, round, and reactive to light.  Neck: Neck supple.  Cardiovascular: Normal rate and normal heart sounds.  No murmur heard. Pulmonary/Chest: Effort normal and breath sounds normal. No respiratory distress. No wheezes. She has no rales.  Abdominal: Soft. Bowel sounds are normal. No distension.  There is no tenderness. There is no rebound.  Musculoskeletal: No edema.  Lymphadenopathy: none Neurological: Oriented to self Wheelchair Depedent  Skin: Skin is warm and dry. Has Skin lesions where she picks on her skin on her Neck and Arms Psychiatric: Normal mood and affect. Behavior is normal. Thought content normal.    Labs reviewed: Recent Labs    04/03/20 0000  NA 143  K 4.0  CL 106  CO2 29*  BUN 23*  CREATININE 1.0  CALCIUM 9.1   Recent Labs    04/03/20 0000  AST 13  ALT 15  ALKPHOS 70  ALBUMIN 3.6   Recent Labs    04/03/20 0000  WBC 7.9  NEUTROABS 4,645  HGB 11.4*  HCT 35*  PLT 228   Lab Results  Component Value Date   TSH 2.39 04/03/2020   Lab Results  Component Value Date   HGBA1C 5.9 (H) 03/06/2019   Lab Results  Component Value Date   CHOL 131 04/03/2020   HDL 43 04/03/2020   LDLCALC 72 04/03/2020   TRIG 80 04/03/2020   CHOLHDL 6.3 03/06/2019    Significant Diagnostic Results in last 30 days:  No results found.  Assessment/Plan Cerebrovascular accident (CVA) due to thrombosis of basilar artery (HCC) Continue on Plavix and Statin  Skin picking habit Will Increase Zoloft to 50 mg  Primary hypertension Continue Norvasc GERD On Prilosec  Other hyperlipidemia On statin LDL 72 Recheck Lipid panel Vascular Dementia On Aricept and Namenda Osteoporosis Not candidate for any Stronger treatment Continue Calcium and Vit D    Family/ staff Communication:   Labs/tests ordered:  CBC,CMP,TSH,Lipid Panel

## 2020-12-16 ENCOUNTER — Non-Acute Institutional Stay (SKILLED_NURSING_FACILITY): Payer: PPO | Admitting: Nurse Practitioner

## 2020-12-16 ENCOUNTER — Encounter: Payer: Self-pay | Admitting: Nurse Practitioner

## 2020-12-16 DIAGNOSIS — R2689 Other abnormalities of gait and mobility: Secondary | ICD-10-CM | POA: Diagnosis not present

## 2020-12-16 DIAGNOSIS — I6302 Cerebral infarction due to thrombosis of basilar artery: Secondary | ICD-10-CM

## 2020-12-16 DIAGNOSIS — F418 Other specified anxiety disorders: Secondary | ICD-10-CM

## 2020-12-16 DIAGNOSIS — R296 Repeated falls: Secondary | ICD-10-CM | POA: Diagnosis not present

## 2020-12-16 DIAGNOSIS — M810 Age-related osteoporosis without current pathological fracture: Secondary | ICD-10-CM | POA: Diagnosis not present

## 2020-12-16 DIAGNOSIS — F0150 Vascular dementia without behavioral disturbance: Secondary | ICD-10-CM

## 2020-12-16 DIAGNOSIS — I10 Essential (primary) hypertension: Secondary | ICD-10-CM | POA: Diagnosis not present

## 2020-12-16 DIAGNOSIS — M6281 Muscle weakness (generalized): Secondary | ICD-10-CM | POA: Diagnosis not present

## 2020-12-16 DIAGNOSIS — R2681 Unsteadiness on feet: Secondary | ICD-10-CM | POA: Diagnosis not present

## 2020-12-16 DIAGNOSIS — R269 Unspecified abnormalities of gait and mobility: Secondary | ICD-10-CM | POA: Diagnosis not present

## 2020-12-16 DIAGNOSIS — K219 Gastro-esophageal reflux disease without esophagitis: Secondary | ICD-10-CM

## 2020-12-16 DIAGNOSIS — I69392 Facial weakness following cerebral infarction: Secondary | ICD-10-CM | POA: Diagnosis not present

## 2020-12-16 DIAGNOSIS — Z9181 History of falling: Secondary | ICD-10-CM | POA: Diagnosis not present

## 2020-12-16 DIAGNOSIS — Z8781 Personal history of (healed) traumatic fracture: Secondary | ICD-10-CM | POA: Diagnosis not present

## 2020-12-16 NOTE — Assessment & Plan Note (Signed)
Gait abnormality, uses w/c for mobility, transfer with supervision/assistance.  

## 2020-12-16 NOTE — Assessment & Plan Note (Signed)
Depression, skin picking habit, increased  Sertraline 50mg  qd 12/10/20

## 2020-12-16 NOTE — Assessment & Plan Note (Signed)
GERD, stable, on Pantoprazole 40mg qd.  

## 2020-12-16 NOTE — Assessment & Plan Note (Signed)
Dementia, lack of safety awareness, an resident of SNF [facility], takesMenmantine 10mg  bid, Donepezil 10mg  qd for memory

## 2020-12-16 NOTE — Assessment & Plan Note (Signed)
Hx of CVA, thrombosis of basilar artery, takes Plavix, statin, slightly RLE weakness.  

## 2020-12-16 NOTE — Progress Notes (Signed)
Location:    [facility] Room Number: 18/A Place of Service:  SNF [phone]) Provider: Arna Snipe Tyesha Joffe NP  [name], MD  Patient Care Team: [name], MD as PCP - General (Internal Medicine) Ngetich, Donalee Citrin, NP as Nurse Practitioner [facility] Medicine)  Extended Emergency Contact Information Primary Emergency Contact: Gordan,[place] Address: [address]          [place], [place] [address] [place] of [place] Home Phone: [phone] Relation: Daughter Secondary Emergency Contact: Iven Finn Address: [address]          [place], [place] [address] [place] Home Phone: [phone] Relation: Spouse  Code Status:  DNR Goals of care: Advanced Directive information Advanced Directives 10/17/2020  Does Patient Have a Medical Advance Directive? Yes  Type of Advance Directive Out of facility DNR (pink MOST or yellow form);Healthcare Power of Attorney  Does patient want to make changes to medical advance directive? No - Patient declined  Copy of Healthcare Power of Attorney in Chart? Yes - validated most recent copy scanned in chart (See row information)  Pre-existing out of facility DNR order (yellow form or pink MOST form) Yellow form placed in chart (order not valid for inpatient use)     Chief Complaint  Patient presents with  . Acute Visit    Fall    HPI:  Pt is a 87 y.o. female seen today for an acute visit for reported unwitnessed fall 12/14/19 when the patient was found sitting on the floor, no apparent injury noted.       GERD, stable, on Pantoprazole 40mg  qd.  HTN, takesamlodipine 5mg , Atorvastatin 40mg  qd Dementia, an resident of SNF [facility], takesMenmantine 10mg  bid, Donepezil 10mg  qd for memory Depression, skin picking habit, increased  Sertraline 50mg  qd 12/10/20 Hx of CVA, thrombosis of basilar artery, takes Plavix, statin, slightly RLE weakness.   Gait abnormality, uses w/c  for mobility, transfer with supervision/assistance.   Past Medical History:  Diagnosis Date  . Allergic rhinitis   . Anxiety   . Balance problem 04/28/2015  . Fatigue 04/28/2015  . Hearing loss 04/28/2015  . history of Fracture of right humerus 04/28/2015  . HLD (hyperlipidemia) 03/05/2019  . Hyperglycemia   . Hyperlipidemia   . Hypertension   . Memory loss 04/28/2015   04/26/2014 MMSE 28/30. Failed clock drawing. 04/21/15 MMSE 21/30. Failed clock drawing.   . Osteoporosis    Past Surgical History:  Procedure Laterality Date  . ABDOMINAL HYSTERECTOMY    . CATARACT EXTRACTION  2010  . ELBOW SURGERY Right 1988  . FEMUR FRACTURE SURGERY Right 06/2014   [place], [place] Dr. 04/30/2015  . HUMERUS FRACTURE SURGERY Right 2015   [place] Dr. 04/28/2014  . TONSILLECTOMY  1935    Allergies  Allergen Reactions  . Ace Inhibitors Other (See Comments)    Unknown reaction  . Almond Oil Rash  . Plum Pulp Rash    Allergies as of 12/16/2020      Reactions   Ace Inhibitors Other (See Comments)   Unknown reaction   Almond Oil Rash   Plum Pulp Rash      Medication List       Accurate as of December 16, 2020 11:59 PM. If you have any questions, ask your nurse or doctor.        amLODipine 5 MG tablet Commonly known as: NORVASC Take 5 mg by mouth daily.   atorvastatin 40 MG tablet Commonly known as: LIPITOR  Take 1 tablet (40 mg total) by mouth daily at 6 PM.   calcium carbonate 1500 (600 Ca) MG Tabs tablet Commonly known as: OSCAL Take 600 mg of elemental calcium by mouth daily with breakfast.   clopidogrel 75 MG tablet Commonly known as: PLAVIX Take 1 tablet (75 mg total) by mouth daily.   donepezil 10 MG tablet Commonly known as: ARICEPT TAKE ONE TABLET BY MOUTH DAILY TO PRESERVE MEMORY   Fish Oil 500 MG Caps Take 1 capsule by mouth daily.   memantine 10 MG tablet Commonly known as: Namenda Take 1 tablet (10 mg total) by mouth 2 (two) times daily.   pantoprazole  40 MG tablet Commonly known as: Protonix Take 1 tablet (40 mg total) by mouth daily.   sertraline 50 MG tablet Commonly known as: ZOLOFT Take 50 mg by mouth daily.   triamcinolone 0.1 % Commonly known as: KENALOG Apply 1 application topically 2 (two) times daily as needed.   Vitamin D3 25 MCG (1000 UT) Caps Take 1,000 Units daily by mouth.   zinc oxide 20 % ointment Apply 1 application topically as needed for irritation. Apply to peri/buttocks after every incontinent episode       Review of Systems  Constitutional: Negative for activity change, fatigue and fever.  HENT: Positive for hearing loss. Negative for congestion and voice change.   Eyes: Negative for visual disturbance.  Respiratory: Negative for cough.   Cardiovascular: Negative for leg swelling.  Gastrointestinal: Negative for abdominal pain and constipation.  Genitourinary: Negative for dysuria and urgency.  Musculoskeletal: Positive for arthralgias and gait problem.  Skin: Negative for color change.  Neurological: Negative for speech difficulty and light-headedness.       Dementia.   Psychiatric/Behavioral: Positive for confusion. Negative for sleep disturbance. The patient is not nervous/anxious.     Immunization History  Administered Date(s) Administered  . DTaP 05/12/2014  . Influenza, High Dose Seasonal PF 09/21/2017, 09/27/2019, 09/16/2020  . Influenza,inj,Quad PF,6+ Mos 09/14/2018  . Influenza-Unspecified 10/07/2014, 09/11/2015, 09/23/2016  . Moderna Sars-Covid-2 Vaccination 12/17/2019, 01/14/2020, 10/27/2020  . PPD Test 10/16/2014  . Pneumococcal Conjugate-13 10/20/2017  . Pneumococcal Polysaccharide-23 08/20/2011   Pertinent  Health Maintenance Due  Topic Date Due  . DEXA SCAN  Never done  . INFLUENZA VACCINE  Completed  . PNA vac Low Risk Adult  Completed   Fall Risk  11/21/2018 10/14/2017 02/17/2016 01/06/2015  Falls in the past year? 0 No No Yes  Comment - - - fx (R) humerus; fx (R) femur   Number falls in past yr: 0 - - 2 or more  Injury with Fall? 0 - - -  Risk for fall due to : - - - History of fall(s)   Functional Status Survey:    Vitals:   12/16/20 1631  BP: 140/75  Pulse: 61  Resp: (!) 21  Temp: 97.6 F (36.4 C)  SpO2: 97%  Weight: 146 lb 3.2 oz (66.3 kg)  Height: 5\' 2"  (1.575 m)   Body mass index is 26.74 kg/m. Physical Exam Vitals and nursing note reviewed.  Constitutional:      Appearance: Normal appearance.  HENT:     Head: Normocephalic and atraumatic.  Eyes:     Extraocular Movements: Extraocular movements intact.     Conjunctiva/sclera: Conjunctivae normal.     Pupils: Pupils are equal, round, and reactive to light.  Cardiovascular:     Rate and Rhythm: Normal rate and regular rhythm.     Heart sounds: No murmur  heard.   Pulmonary:     Breath sounds: No rales.  Abdominal:     General: Bowel sounds are normal.     Palpations: Abdomen is soft.     Tenderness: There is no abdominal tenderness.  Musculoskeletal:     Cervical back: Normal range of motion and neck supple.     Right lower leg: No edema.     Left lower leg: No edema.  Skin:    General: Skin is warm and dry.     Findings: No rash.  Neurological:     General: No focal deficit present.     Mental Status: She is alert. Mental status is at baseline.     Gait: Gait abnormal.     Comments: Oriented to self. Slightly weakness in RLE  Psychiatric:        Mood and Affect: Mood normal.        Behavior: Behavior normal.     Labs reviewed: Recent Labs    04/03/20 0000  NA 143  K 4.0  CL 106  CO2 29*  BUN 23*  CREATININE 1.0  CALCIUM 9.1   Recent Labs    04/03/20 0000  AST 13  ALT 15  ALKPHOS 70  ALBUMIN 3.6   Recent Labs    04/03/20 0000  WBC 7.9  NEUTROABS 4,645  HGB 11.4*  HCT 35*  PLT 228   Lab Results  Component Value Date   TSH 2.39 04/03/2020   Lab Results  Component Value Date   HGBA1C 5.9 (H) 03/06/2019   Lab Results  Component Value  Date   CHOL 131 04/03/2020   HDL 43 04/03/2020   LDLCALC 72 04/03/2020   TRIG 80 04/03/2020   CHOLHDL 6.3 03/06/2019    Significant Diagnostic Results in last 30 days:  No results found.  Assessment/Plan Frequent falls unwitnessed fall 12/14/19 when the patient was found sitting on the floor, no apparent injury noted. Lack of safety awareness, unsteady gait, needs supervision/assistance for transfer are contributory.   GERD (gastroesophageal reflux disease) GERD, stable, on Pantoprazole 40mg  qd.   Hypertension HTN, takesamlodipine 5mg , Atorvastatin 40mg  qd   Vascular dementia (HCC) Dementia, lack of safety awareness, an resident of SNF [facility], takesMenmantine 10mg  bid, Donepezil 10mg  qd for memory   Depression with anxiety Depression, skin picking habit, increased  Sertraline 50mg  qd 12/10/20   Stroke (cerebrum) (HCC) Hx of CVA, thrombosis of basilar artery, takes Plavix, statin, slightly RLE weakness.    Gait abnormality Gait abnormality, uses w/c for mobility, transfer with supervision/assistance.      Family/ staff Communication: plan of care reviewed with the patient and charge nurse.   Labs/tests ordered:  none  Time spend 25 minutes.

## 2020-12-16 NOTE — Assessment & Plan Note (Signed)
unwitnessed fall 12/14/19 when the patient was found sitting on the floor, no apparent injury noted. Lack of safety awareness, unsteady gait, needs supervision/assistance for transfer are contributory.

## 2020-12-16 NOTE — Assessment & Plan Note (Signed)
HTN, takesamlodipine 5mg , Atorvastatin 40mg  qd

## 2020-12-17 ENCOUNTER — Encounter: Payer: Self-pay | Admitting: Nurse Practitioner

## 2020-12-19 ENCOUNTER — Non-Acute Institutional Stay (SKILLED_NURSING_FACILITY): Payer: PPO | Admitting: Nurse Practitioner

## 2020-12-19 ENCOUNTER — Encounter: Payer: Self-pay | Admitting: Nurse Practitioner

## 2020-12-19 DIAGNOSIS — K219 Gastro-esophageal reflux disease without esophagitis: Secondary | ICD-10-CM

## 2020-12-19 DIAGNOSIS — I6302 Cerebral infarction due to thrombosis of basilar artery: Secondary | ICD-10-CM

## 2020-12-19 DIAGNOSIS — I10 Essential (primary) hypertension: Secondary | ICD-10-CM | POA: Diagnosis not present

## 2020-12-19 DIAGNOSIS — F0150 Vascular dementia without behavioral disturbance: Secondary | ICD-10-CM | POA: Diagnosis not present

## 2020-12-19 DIAGNOSIS — F418 Other specified anxiety disorders: Secondary | ICD-10-CM | POA: Diagnosis not present

## 2020-12-19 DIAGNOSIS — R269 Unspecified abnormalities of gait and mobility: Secondary | ICD-10-CM

## 2020-12-19 NOTE — Assessment & Plan Note (Signed)
Hx of CVA, thrombosis of basilar artery, takes Plavix, statin, slightly RLE weakness.  

## 2020-12-19 NOTE — Assessment & Plan Note (Signed)
HTN, takesamlodipine 5mg , Atorvastatin 40mg  qd, pending BMP, lipid panel.

## 2020-12-19 NOTE — Assessment & Plan Note (Signed)
GERD, stable, on Pantoprazole 40mg  qd. Pending CBC

## 2020-12-19 NOTE — Assessment & Plan Note (Signed)
Depression, skin picking habit, increased  Sertraline 50mg  qd 12/10/20, seems better, observe.

## 2020-12-19 NOTE — Progress Notes (Signed)
Location:   [facility] Room Number: 18 Place of Service:  SNF (31) Provider:  Zyree Traynham, Arlean Hopping, NP  [name], MD  Patient Care Team: [name], MD as PCP - General (Internal Medicine) Ngetich, Donalee Citrin, NP as Nurse Practitioner [facility] Medicine)  Extended Emergency Contact Information Primary Emergency Contact: Gordan,[place] Address: [address]          [place], [place] [address] Darden Amber of [place] Home Phone: [phone] Relation: Daughter Secondary Emergency Contact: Iven Finn Address: [address]          [place], [place] [address] [place] of [place] Home Phone: [phone] Relation: Spouse  Code Status:  DNR Goals of care: Advanced Directive information Advanced Directives 12/19/2020  Does Patient Have a Medical Advance Directive? Yes  Type of Estate agent of [place];Out of facility DNR (pink MOST or yellow form)  Does patient want to make changes to medical advance directive? No - Patient declined  Copy of Healthcare Power of Attorney in Chart? Yes - validated most recent copy scanned in chart (See row information)  Pre-existing out of facility DNR order (yellow form or pink MOST form) Yellow form placed in chart (order not valid for inpatient use)     Chief Complaint  Patient presents with  . Medical Management of Chronic Issues    Routine follow up visit.  . Quality Metric Gaps    Dexa scan    HPI:  Pt is a 87 y.o. female seen today for medical management of chronic diseases.    GERD, stable, on Pantoprazole 40mg  qd.  HTN, takesamlodipine 5mg , Atorvastatin 40mg  qd Dementia, an resident of SNF [facility], takesMenmantine 10mg  bid, Donepezil 10mg  qd for memory Depression, skin picking habit, increased  Sertraline 50mg  qd 12/10/20 Hx of CVA, thrombosis of basilar artery, takes Plavix, statin, slightly RLE weakness.             Gait abnormality, uses w/c for  mobility, transfer with supervision/assistance.    Past Medical History:  Diagnosis Date  . Allergic rhinitis   . Anxiety   . Balance problem 04/28/2015  . Fatigue 04/28/2015  . Hearing loss 04/28/2015  . history of Fracture of right humerus 04/28/2015  . HLD (hyperlipidemia) 03/05/2019  . Hyperglycemia   . Hyperlipidemia   . Hypertension   . Memory loss 04/28/2015   04/26/2014 MMSE 28/30. Failed clock drawing. 04/21/15 MMSE 21/30. Failed clock drawing.   . Osteoporosis    Past Surgical History:  Procedure Laterality Date  . ABDOMINAL HYSTERECTOMY    . CATARACT EXTRACTION  2010  . ELBOW SURGERY Right 1988  . FEMUR FRACTURE SURGERY Right 06/2014   [place], [place]/07/2019 Dr. 04/30/2015  . HUMERUS FRACTURE SURGERY Right 2015   [place] 04/28/2014  . TONSILLECTOMY  1935    Allergies  Allergen Reactions  . Ace Inhibitors Other (See Comments)    Unknown reaction  . Almond Oil Rash  . Plum Pulp Rash    Allergies as of 12/19/2020      Reactions   Ace Inhibitors Other (See Comments)   Unknown reaction   Almond Oil Rash   Plum Pulp Rash      Medication List       Accurate as of December 19, 2020  3:50 PM. If you have any questions, ask your nurse or doctor.        amLODipine 5 MG tablet Commonly known as: NORVASC Take 5 mg by mouth daily.  atorvastatin 40 MG tablet Commonly known as: LIPITOR Take 1 tablet (40 mg total) by mouth daily at 6 PM.   calcium carbonate 1500 (600 Ca) MG Tabs tablet Commonly known as: OSCAL Take 600 mg of elemental calcium by mouth daily with breakfast.   clopidogrel 75 MG tablet Commonly known as: PLAVIX Take 1 tablet (75 mg total) by mouth daily.   donepezil 10 MG tablet Commonly known as: ARICEPT TAKE ONE TABLET BY MOUTH DAILY TO PRESERVE MEMORY   Fish Oil 500 MG Caps Take 1 capsule by mouth daily.   memantine 10 MG tablet Commonly known as: Namenda Take 1 tablet (10 mg total) by mouth 2 (two) times daily.   pantoprazole 40  MG tablet Commonly known as: Protonix Take 1 tablet (40 mg total) by mouth daily.   sertraline 50 MG tablet Commonly known as: ZOLOFT Take 50 mg by mouth daily.   triamcinolone 0.1 % Commonly known as: KENALOG Apply 1 application topically 2 (two) times daily as needed.   Vitamin D3 25 MCG (1000 UT) Caps Take 1,000 Units daily by mouth.   zinc oxide 20 % ointment Apply 1 application topically as needed for irritation. Apply to peri/buttocks after every incontinent episode       Review of Systems  Constitutional: Negative for fatigue, fever and unexpected weight change.  HENT: Positive for hearing loss. Negative for congestion and voice change.   Eyes: Negative for visual disturbance.  Respiratory: Negative for cough.   Cardiovascular: Negative for leg swelling.  Gastrointestinal: Negative for abdominal pain and constipation.  Genitourinary: Negative for dysuria and urgency.  Musculoskeletal: Positive for arthralgias and gait problem.  Skin: Negative for color change.  Neurological: Negative for speech difficulty and light-headedness.       Dementia.   Psychiatric/Behavioral: Positive for confusion. Negative for sleep disturbance. The patient is not nervous/anxious.     Immunization History  Administered Date(s) Administered  . DTaP 05/12/2014  . Influenza, High Dose Seasonal PF 09/21/2017, 09/27/2019, 09/16/2020  . Influenza,inj,Quad PF,6+ Mos 09/14/2018  . Influenza-Unspecified 10/07/2014, 09/11/2015, 09/23/2016  . Moderna Sars-Covid-2 Vaccination 12/17/2019, 01/14/2020, 10/27/2020  . PPD Test 10/16/2014  . Pneumococcal Conjugate-13 10/20/2017  . Pneumococcal Polysaccharide-23 08/20/2011   Pertinent  Health Maintenance Due  Topic Date Due  . DEXA SCAN  Never done  . INFLUENZA VACCINE  Completed  . PNA vac Low Risk Adult  Completed   Fall Risk  11/21/2018 10/14/2017 02/17/2016 01/06/2015  Falls in the past year? 0 No No Yes  Comment - - - fx (R) humerus; fx (R)  femur  Number falls in past yr: 0 - - 2 or more  Injury with Fall? 0 - - -  Risk for fall due to : - - - History of fall(s)   Functional Status Survey:    Vitals:   12/19/20 1119  BP: (!) 141/68  Pulse: 73  Resp: 18  Temp: (!) 97.2 F (36.2 C)  SpO2: 95%  Weight: 146 lb 3.2 oz (66.3 kg)  Height: 5\' 2"  (1.575 m)   Body mass index is 26.74 kg/m. Physical Exam Vitals and nursing note reviewed.  Constitutional:      Appearance: Normal appearance.  HENT:     Head: Normocephalic and atraumatic.  Eyes:     Extraocular Movements: Extraocular movements intact.     Conjunctiva/sclera: Conjunctivae normal.     Pupils: Pupils are equal, round, and reactive to light.  Cardiovascular:     Rate and Rhythm: Normal rate and  regular rhythm.     Heart sounds: No murmur heard.   Pulmonary:     Breath sounds: No rales.  Abdominal:     General: Bowel sounds are normal.     Palpations: Abdomen is soft.     Tenderness: There is no abdominal tenderness.  Musculoskeletal:     Cervical back: Normal range of motion and neck supple.     Right lower leg: No edema.     Left lower leg: No edema.  Skin:    General: Skin is warm and dry.  Neurological:     General: No focal deficit present.     Mental Status: She is alert. Mental status is at baseline.     Gait: Gait abnormal.     Comments: Oriented to self. Slightly weakness in RLE  Psychiatric:        Mood and Affect: Mood normal.        Behavior: Behavior normal.     Labs reviewed: Recent Labs    04/03/20 0000  NA 143  K 4.0  CL 106  CO2 29*  BUN 23*  CREATININE 1.0  CALCIUM 9.1   Recent Labs    04/03/20 0000  AST 13  ALT 15  ALKPHOS 70  ALBUMIN 3.6   Recent Labs    04/03/20 0000  WBC 7.9  NEUTROABS 4,645  HGB 11.4*  HCT 35*  PLT 228   Lab Results  Component Value Date   TSH 2.39 04/03/2020   Lab Results  Component Value Date   HGBA1C 5.9 (H) 03/06/2019   Lab Results  Component Value Date   CHOL 131  04/03/2020   HDL 43 04/03/2020   LDLCALC 72 04/03/2020   TRIG 80 04/03/2020   CHOLHDL 6.3 03/06/2019    Significant Diagnostic Results in last 30 days:  No results found.  Assessment/Plan GERD (gastroesophageal reflux disease) GERD, stable, on Pantoprazole 40mg  qd. Pending CBC  Hypertension HTN, takesamlodipine 5mg , Atorvastatin 40mg  qd, pending BMP, lipid panel.    Vascular dementia ([place]) Dementia, an resident of SNF [facility], takesMenmantine 10mg  bid, Donepezil 10mg  qd for memory, limited safety awareness, risk of falling, pending TSH   Depression with anxiety Depression, skin picking habit, increased  Sertraline 50mg  qd 12/10/20, seems better, observe.   Stroke (cerebrum) (HCC) Hx of CVA, thrombosis of basilar artery, takes Plavix, statin, slightly RLE weakness.  Gait abnormality Gait abnormality, uses w/c for mobility, transfer with supervision/assistance.      Family/ staff Communication: plan of care reviewed with the patient and charge nurse.   Labs/tests ordered:  Pending CBC, BMP, TSH, lipid panel.   Time spend 35 minutes.

## 2020-12-19 NOTE — Assessment & Plan Note (Signed)
Gait abnormality, uses w/c for mobility, transfer with supervision/assistance.  

## 2020-12-19 NOTE — Assessment & Plan Note (Signed)
Dementia, an resident of SNF [facility], takesMenmantine 10mg  bid, Donepezil 10mg  qd for memory, limited safety awareness, risk of falling, pending TSH

## 2020-12-22 DIAGNOSIS — R799 Abnormal finding of blood chemistry, unspecified: Secondary | ICD-10-CM | POA: Diagnosis not present

## 2020-12-22 DIAGNOSIS — Z13220 Encounter for screening for lipoid disorders: Secondary | ICD-10-CM | POA: Diagnosis not present

## 2020-12-22 DIAGNOSIS — Z13228 Encounter for screening for other metabolic disorders: Secondary | ICD-10-CM | POA: Diagnosis not present

## 2020-12-23 ENCOUNTER — Encounter: Payer: Self-pay | Admitting: Nurse Practitioner

## 2020-12-23 DIAGNOSIS — N183 Other specified diabetes mellitus with diabetic chronic kidney disease: Secondary | ICD-10-CM | POA: Insufficient documentation

## 2020-12-23 DIAGNOSIS — E1322 Other specified diabetes mellitus with diabetic chronic kidney disease: Secondary | ICD-10-CM | POA: Insufficient documentation

## 2021-01-14 DIAGNOSIS — I69392 Facial weakness following cerebral infarction: Secondary | ICD-10-CM | POA: Diagnosis not present

## 2021-01-14 DIAGNOSIS — F0390 Unspecified dementia without behavioral disturbance: Secondary | ICD-10-CM | POA: Diagnosis not present

## 2021-01-14 DIAGNOSIS — I69322 Dysarthria following cerebral infarction: Secondary | ICD-10-CM | POA: Diagnosis not present

## 2021-01-14 DIAGNOSIS — M810 Age-related osteoporosis without current pathological fracture: Secondary | ICD-10-CM | POA: Diagnosis not present

## 2021-01-14 DIAGNOSIS — I10 Essential (primary) hypertension: Secondary | ICD-10-CM | POA: Diagnosis not present

## 2021-01-14 DIAGNOSIS — M6281 Muscle weakness (generalized): Secondary | ICD-10-CM | POA: Diagnosis not present

## 2021-01-14 DIAGNOSIS — Z8781 Personal history of (healed) traumatic fracture: Secondary | ICD-10-CM | POA: Diagnosis not present

## 2021-01-26 ENCOUNTER — Non-Acute Institutional Stay (SKILLED_NURSING_FACILITY): Payer: PPO | Admitting: Nurse Practitioner

## 2021-01-26 ENCOUNTER — Encounter: Payer: Self-pay | Admitting: Nurse Practitioner

## 2021-01-26 DIAGNOSIS — I10 Essential (primary) hypertension: Secondary | ICD-10-CM | POA: Diagnosis not present

## 2021-01-26 DIAGNOSIS — R269 Unspecified abnormalities of gait and mobility: Secondary | ICD-10-CM | POA: Diagnosis not present

## 2021-01-26 DIAGNOSIS — R7303 Prediabetes: Secondary | ICD-10-CM

## 2021-01-26 DIAGNOSIS — R296 Repeated falls: Secondary | ICD-10-CM

## 2021-01-26 DIAGNOSIS — F418 Other specified anxiety disorders: Secondary | ICD-10-CM

## 2021-01-26 DIAGNOSIS — K219 Gastro-esophageal reflux disease without esophagitis: Secondary | ICD-10-CM | POA: Diagnosis not present

## 2021-01-26 DIAGNOSIS — I6302 Cerebral infarction due to thrombosis of basilar artery: Secondary | ICD-10-CM

## 2021-01-26 DIAGNOSIS — F0150 Vascular dementia without behavioral disturbance: Secondary | ICD-10-CM

## 2021-01-26 LAB — BASIC METABOLIC PANEL
BUN: 24 — AB (ref 4–21)
CO2: 31 — AB (ref 13–22)
Chloride: 102 (ref 99–108)
Creatinine: 1.2 — AB (ref 0.5–1.1)
Glucose: 131
Potassium: 4.7 (ref 3.4–5.3)
Sodium: 139 (ref 137–147)

## 2021-01-26 LAB — LIPID PANEL
Cholesterol: 138 (ref 0–200)
HDL: 43 (ref 35–70)
LDL Cholesterol: 78
LDl/HDL Ratio: 3.2
Triglycerides: 83 (ref 40–160)

## 2021-01-26 LAB — CBC AND DIFFERENTIAL
HCT: 34 — AB (ref 36–46)
Hemoglobin: 11.5 — AB (ref 12.0–16.0)
Neutrophils Absolute: 5150
Platelets: 360 (ref 150–399)
WBC: 8.2

## 2021-01-26 LAB — HEPATIC FUNCTION PANEL
ALT: 12 (ref 7–35)
AST: 13 (ref 13–35)
Alkaline Phosphatase: 55 (ref 25–125)
Bilirubin, Total: 0.5

## 2021-01-26 LAB — CBC: RBC: 3.42 — AB (ref 3.87–5.11)

## 2021-01-26 LAB — COMPREHENSIVE METABOLIC PANEL
Albumin: 3.1 — AB (ref 3.5–5.0)
Calcium: 8.8 (ref 8.7–10.7)
Globulin: 2.3

## 2021-01-26 LAB — HEMOGLOBIN A1C: Hemoglobin A1C: 7.6

## 2021-01-26 NOTE — Progress Notes (Signed)
Location:    [place] Room Number: 18 Place of Service:  SNF (31) Provider:  Marda Stalker, Lennie Odor NP  Virgie Dad, MD  Patient Care Team: Virgie Dad, MD as PCP - General (Internal Medicine) Ngetich, Nelda Bucks, NP as Nurse Practitioner [facility] Medicine)  Extended Emergency Contact Information Primary Emergency Contact: Gordan,[place] Address: [address]          [place], [place] [address] Johnnette Litter of [place] Phone: [phone] Relation: Daughter Secondary Emergency Contact: Verneda Skill Address: [address]          [place], [place] [address] [place] of [place] Phone: [phone] Relation: Spouse  Code Status:  DNR, Managed care Goals of care: Advanced Directive information Advanced Directives 12/19/2020  Does Patient Have a Medical Advance Directive? Yes  Type of Paramedic of [place];Out of facility DNR (pink MOST or yellow form)  Does patient want to make changes to medical advance directive? No - Patient declined  Copy of [place] in Chart? Yes - validated most recent copy scanned in chart (See row information)  Pre-existing out of facility DNR order (yellow form or pink MOST form) Yellow form placed in chart (order not valid for inpatient use)     Chief Complaint  Patient presents with  . Medical Management of Chronic Issues  . Health Maintenance    Foot and eye exam, urine microalbumin, Dexa scan, Hmg A1C    HPI:  Pt is a 87 y.o. female seen today for medical management of chronic diseases.      Fall 01/25/21 when the patient was observed on the floor in front of her w/c. The patient sated she accidentally pulled the call bell cord out of the wall and was try to put it back in and slid to the floor, not apparent injury.   GERD, stable, on Pantoprazole 52m qd. Hgb 11.0 12/22/20 HTN, takesamlodipine 548m Atorvastatin 401md Dementia, an resident of SNF  [facility], takesMenmantine 86m62md, Donepezil 86mg73mfor memory Depression, skin picking habit,increasedSertraline 50mg 54m/29/21 Hx of CVA, thrombosis of basilar artery, takes Plavix, statin, slightly RLE weakness. Gait abnormality, uses w/c for mobility, transfer with supervision/assistance.  Prediabetes, needs foot/eye exam, urine micro albumin, Hgb a1c, DEXA     Past Medical History:  Diagnosis Date  . Allergic rhinitis   . Anxiety   . Balance problem 04/28/2015  . Fatigue 04/28/2015  . Hearing loss 04/28/2015  . history of Fracture of right humerus 04/28/2015  . HLD (hyperlipidemia) 03/05/2019  . Hyperglycemia   . Hyperlipidemia   . Hypertension   . Memory loss 04/28/2015   04/26/2014 MMSE 28/30. Failed clock drawing. 04/21/15 MMSE 21/30. Failed clock drawing.   . Osteoporosis    Past Surgical History:  Procedure Laterality Date  . ABDOMINAL HYSTERECTOMY    . CATARACT EXTRACTION  2010  . ELBOW SURGERY Right 1988  . FEMUR FRACTURE SURGERY Right 06/2014   [place] Right 2015   [place]  [place]    Allergies  Allergen Reactions  . Ace Inhibitors Other (See Comments)    Unknown reaction  . Almond Oil Rash  . Plum Pulp Rash    Allergies as of 01/26/2021      Reactions   Ace Inhibitors Other (See Comments)   Unknown reaction   Almond Oil Rash   Plum Pulp Rash  Medication List       Accurate as of January 26, 2021 11:59 PM. If you have any questions, ask your nurse or doctor.        amLODipine 5 MG tablet Commonly known as: NORVASC Take 5 mg by mouth daily.   atorvastatin 40 MG tablet Commonly known as: LIPITOR Take 1 tablet (40 mg total) by mouth daily at 6 PM.   calcium carbonate 1500 (600 Ca) MG Tabs tablet Commonly known as: OSCAL Take 600 mg of elemental calcium by mouth daily with breakfast.   clopidogrel 75 MG  tablet Commonly known as: PLAVIX Take 1 tablet (75 mg total) by mouth daily.   donepezil 10 MG tablet Commonly known as: ARICEPT TAKE ONE TABLET BY MOUTH DAILY TO PRESERVE MEMORY   Fish Oil 500 MG Caps Take 1 capsule by mouth daily.   memantine 10 MG tablet Commonly known as: Namenda Take 1 tablet (10 mg total) by mouth 2 (two) times daily.   pantoprazole 40 MG tablet Commonly known as: Protonix Take 1 tablet (40 mg total) by mouth daily.   sertraline 50 MG tablet Commonly known as: ZOLOFT Take 50 mg by mouth daily.   triamcinolone 0.1 % Commonly known as: KENALOG Apply 1 application topically 2 (two) times daily as needed.   Vitamin D3 25 MCG (1000 UT) Caps Take 1,000 Units daily by mouth.   zinc oxide 20 % ointment Apply 1 application topically as needed for irritation. Apply to peri/buttocks after every incontinent episode       Review of Systems  Constitutional: Negative for fatigue, fever and unexpected weight change.  HENT: Positive for hearing loss. Negative for congestion and voice change.   Eyes: Negative for visual disturbance.  Respiratory: Negative for cough.   Cardiovascular: Negative for leg swelling.  Gastrointestinal: Negative for abdominal pain and constipation.  Genitourinary: Negative for dysuria and urgency.  Musculoskeletal: Positive for arthralgias and gait problem.  Skin: Negative for color change.  Neurological: Negative for speech difficulty and light-headedness.       Dementia.   Psychiatric/Behavioral: Positive for confusion. Negative for sleep disturbance. The patient is not nervous/anxious.     Immunization History  Administered Date(s) Administered  . DTaP 05/12/2014  . Influenza, High Dose Seasonal PF 09/21/2017, 09/27/2019, 09/16/2020  . Influenza,inj,Quad PF,6+ Mos 09/14/2018  . Influenza-Unspecified 10/07/2014, 09/11/2015, 09/23/2016  . Moderna Sars-Covid-2 Vaccination 12/17/2019, 01/14/2020, 10/27/2020  . PPD Test 10/16/2014   . Pneumococcal Conjugate-13 10/20/2017  . Pneumococcal Polysaccharide-23 08/20/2011   Pertinent  Health Maintenance Due  Topic Date Due  . FOOT EXAM  Never done  . OPHTHALMOLOGY EXAM  Never done  . URINE MICROALBUMIN  Never done  . DEXA SCAN  Never done  . HEMOGLOBIN A1C  09/06/2019  . INFLUENZA VACCINE  Completed  . PNA vac Low Risk Adult  Completed   Fall Risk  11/21/2018 10/14/2017 02/17/2016 01/06/2015  Falls in the past year? 0 No No Yes  Comment - - - fx (R) humerus; fx (R) femur  Number falls in past yr: 0 - - 2 or more  Injury with Fall? 0 - - -  Risk for fall due to : - - - History of fall(s)   Functional Status Survey:    Vitals:   01/26/21 1122  BP: 112/74  Pulse: 62  Resp: 18  Temp: (!) 97.1 F (36.2 C)  SpO2: 95%  Weight: 145 lb 14.4 oz (66.2 kg)  Height: $Remove'5\' 2"'ueBshVN$  (1.575 m)   Body  mass index is 26.69 kg/m. Physical Exam Vitals and nursing note reviewed.  Constitutional:      Appearance: Normal appearance.  HENT:     Head: Normocephalic and atraumatic.  Eyes:     Extraocular Movements: Extraocular movements intact.     Conjunctiva/sclera: Conjunctivae normal.     Pupils: Pupils are equal, round, and reactive to light.  Cardiovascular:     Rate and Rhythm: Normal rate and regular rhythm.     Heart sounds: No murmur heard.   Pulmonary:     Breath sounds: No rales.  Abdominal:     General: Bowel sounds are normal.     Palpations: Abdomen is soft.     Tenderness: There is no abdominal tenderness.  Musculoskeletal:     Cervical back: Normal range of motion and neck supple.     Right lower leg: No edema.     Left lower leg: No edema.  Skin:    General: Skin is warm and dry.  Neurological:     General: No focal deficit present.     Mental Status: She is alert. Mental status is at baseline.     Gait: Gait abnormal.     Comments: Oriented to self. Slightly weakness in RLE  Psychiatric:        Mood and Affect: Mood normal.        Behavior: Behavior  normal.     Labs reviewed: Recent Labs    04/03/20 0000  NA 143  K 4.0  CL 106  CO2 29*  BUN 23*  CREATININE 1.0  CALCIUM 9.1   Recent Labs    04/03/20 0000  AST 13  ALT 15  ALKPHOS 70  ALBUMIN 3.6   Recent Labs    04/03/20 0000  WBC 7.9  NEUTROABS 4,645  HGB 11.4*  HCT 35*  PLT 228   Lab Results  Component Value Date   TSH 2.39 04/03/2020   Lab Results  Component Value Date   HGBA1C 5.9 (H) 03/06/2019   Lab Results  Component Value Date   CHOL 131 04/03/2020   HDL 43 04/03/2020   LDLCALC 72 04/03/2020   TRIG 80 04/03/2020   CHOLHDL 6.3 03/06/2019    Significant Diagnostic Results in last 30 days:  No results found.  Assessment/Plan Prediabetes Needs Hgb a1c, urine micro albumin, foot/eye exam, DEXA  Frequent falls Fall 01/25/21 when the patient was observed on the floor in front of her w/c. The patient sated she accidentally pulled the call bell cord out of the wall and was try to put it back in and slid to the floor, not apparent injury. The patient needs close supervision and assistance for safety.  Update CBC/diff, CMP/eGFR  GERD (gastroesophageal reflux disease) stable, on Pantoprazole 44m qd.    Hypertension takesamlodipine 515m Atorvastatin 4019md   [facility], takesMenmantine 42m83md, Donepezil 42mg25mfor memory   Depression with anxiety skin picking habit,increasedSertraline 50mg 60m/29/21  Stroke (cerebrum) (HCC)  [place], thrombosis of basilar artery, takes Plavix, statin, slightly RLE weakness.  Gait abnormality uses w/c for mobility, transfer with supervision/assistance.     Family/ staff Communication: plan of care reviewed with the patient and charge nurse.   Labs/tests ordered:  none  Time spend 35 minutes.

## 2021-01-26 NOTE — Assessment & Plan Note (Signed)
uses w/c for mobility, transfer with supervision/assistance.  

## 2021-01-26 NOTE — Assessment & Plan Note (Signed)
CVA, thrombosis of basilar artery, takes Plavix, statin, slightly RLE weakness.

## 2021-01-26 NOTE — Assessment & Plan Note (Signed)
skin picking habit,increasedSertraline 50mg  qd12/29/21

## 2021-01-26 NOTE — Assessment & Plan Note (Signed)
takesamlodipine 5mg , Atorvastatin 40mg  qd

## 2021-01-26 NOTE — Assessment & Plan Note (Addendum)
Fall 01/25/21 when the patient was observed on the floor in front of her w/c. The patient sated she accidentally pulled the call bell cord out of the wall and was try to put it back in and slid to the floor, not apparent injury. The patient needs close supervision and assistance for safety.  Update CBC/diff, CMP/eGFR

## 2021-01-26 NOTE — Assessment & Plan Note (Signed)
Needs Hgb a1c, urine micro albumin, foot/eye exam, DEXA

## 2021-01-26 NOTE — Assessment & Plan Note (Signed)
an resident of SNF [facility], takesMenmantine 10mg  bid, Donepezil 10mg  qd for memory

## 2021-01-26 NOTE — Assessment & Plan Note (Signed)
stable, on Pantoprazole 40mg qd.  

## 2021-01-27 ENCOUNTER — Encounter: Payer: Self-pay | Admitting: Nurse Practitioner

## 2021-01-29 DIAGNOSIS — E119 Type 2 diabetes mellitus without complications: Secondary | ICD-10-CM | POA: Diagnosis not present

## 2021-01-29 DIAGNOSIS — D649 Anemia, unspecified: Secondary | ICD-10-CM | POA: Diagnosis not present

## 2021-01-29 LAB — HEPATIC FUNCTION PANEL
ALT: 17 (ref 7–35)
AST: 13 (ref 13–35)
Alkaline Phosphatase: 79 (ref 25–125)
Bilirubin, Total: 0.5

## 2021-01-29 LAB — BASIC METABOLIC PANEL
BUN: 27 — AB (ref 4–21)
CO2: 30 — AB (ref 13–22)
Chloride: 109 — AB (ref 99–108)
Creatinine: 1 (ref 0.5–1.1)
Glucose: 88
Potassium: 4.5 (ref 3.4–5.3)
Sodium: 145 (ref 137–147)

## 2021-01-29 LAB — CBC AND DIFFERENTIAL
HCT: 33 — AB (ref 36–46)
Hemoglobin: 10.6 — AB (ref 12.0–16.0)
Neutrophils Absolute: 4384
Platelets: 229 (ref 150–399)
WBC: 8

## 2021-01-29 LAB — COMPREHENSIVE METABOLIC PANEL
Albumin: 3.5 (ref 3.5–5.0)
Calcium: 8.9 (ref 8.7–10.7)
Globulin: 2.1

## 2021-01-29 LAB — HEMOGLOBIN A1C: Hemoglobin A1C: 6

## 2021-01-29 LAB — CBC: RBC: 3.42 — AB (ref 3.87–5.11)

## 2021-02-10 DIAGNOSIS — M6281 Muscle weakness (generalized): Secondary | ICD-10-CM | POA: Diagnosis not present

## 2021-02-10 DIAGNOSIS — Z9181 History of falling: Secondary | ICD-10-CM | POA: Diagnosis not present

## 2021-02-10 DIAGNOSIS — R2689 Other abnormalities of gait and mobility: Secondary | ICD-10-CM | POA: Diagnosis not present

## 2021-02-10 DIAGNOSIS — R2681 Unsteadiness on feet: Secondary | ICD-10-CM | POA: Diagnosis not present

## 2021-02-27 DIAGNOSIS — M79672 Pain in left foot: Secondary | ICD-10-CM | POA: Diagnosis not present

## 2021-02-27 DIAGNOSIS — L602 Onychogryphosis: Secondary | ICD-10-CM | POA: Diagnosis not present

## 2021-02-27 DIAGNOSIS — M79671 Pain in right foot: Secondary | ICD-10-CM | POA: Diagnosis not present

## 2021-02-27 DIAGNOSIS — L84 Corns and callosities: Secondary | ICD-10-CM | POA: Diagnosis not present

## 2021-03-11 ENCOUNTER — Encounter: Payer: Self-pay | Admitting: Internal Medicine

## 2021-03-11 ENCOUNTER — Non-Acute Institutional Stay (SKILLED_NURSING_FACILITY): Payer: PPO | Admitting: Internal Medicine

## 2021-03-11 DIAGNOSIS — F0150 Vascular dementia without behavioral disturbance: Secondary | ICD-10-CM

## 2021-03-11 DIAGNOSIS — F418 Other specified anxiety disorders: Secondary | ICD-10-CM | POA: Diagnosis not present

## 2021-03-11 DIAGNOSIS — R7303 Prediabetes: Secondary | ICD-10-CM

## 2021-03-11 DIAGNOSIS — E7849 Other hyperlipidemia: Secondary | ICD-10-CM | POA: Diagnosis not present

## 2021-03-11 DIAGNOSIS — I10 Essential (primary) hypertension: Secondary | ICD-10-CM | POA: Diagnosis not present

## 2021-03-11 DIAGNOSIS — D649 Anemia, unspecified: Secondary | ICD-10-CM | POA: Diagnosis not present

## 2021-03-11 DIAGNOSIS — F424 Excoriation (skin-picking) disorder: Secondary | ICD-10-CM

## 2021-03-11 DIAGNOSIS — I6302 Cerebral infarction due to thrombosis of basilar artery: Secondary | ICD-10-CM | POA: Diagnosis not present

## 2021-03-11 NOTE — Progress Notes (Signed)
Location:  [facility] Room Number: 18 Place of Service:  SNF (31)  Provider:   Code Status: DNR Goals of Care:  Advanced Directives 12/19/2020  Does Patient Have a Medical Advance Directive? Yes  Type of Estate agent of [place];Out of facility DNR (pink MOST or yellow form)  Does patient want to make changes to medical advance directive? No - Patient declined  Copy of Healthcare Power of Attorney in Chart? Yes - validated most recent copy scanned in chart (See row information)  Pre-existing out of facility DNR order (yellow form or pink MOST form) Yellow form placed in chart (order not valid for inpatient use)     Chief Complaint  Patient presents with  . Medical Management of Chronic Issues    HPI: Patient is a 87 y.o. female seen today for medical management of chronic diseases.    Patient has history of dementia, anxiety,  hypertension and hyperlipidemia History ofAcute CVAMRI showed Acute infarction on Left Para median Pons  Past Medical History:  Diagnosis Date  . Allergic rhinitis   . Anxiety   . Balance problem 04/28/2015  . Fatigue 04/28/2015  . Hearing loss 04/28/2015  . history of Fracture of right humerus 04/28/2015  . HLD (hyperlipidemia) 03/05/2019  . Hyperglycemia   . Hyperlipidemia   . Hypertension   . Memory loss 04/28/2015   04/26/2014 MMSE 28/30. Failed clock drawing. 04/21/15 MMSE 21/30. Failed clock drawing.   . Osteoporosis     Past Surgical History:  Procedure Laterality Date  . ABDOMINAL HYSTERECTOMY    . CATARACT EXTRACTION  2010  . ELBOW SURGERY Right 1988  . FEMUR FRACTURE SURGERY Right 06/2014   [place], [place] Dr. Dione Housekeeper  . HUMERUS FRACTURE SURGERY Right 2015   [place], [place] Dr. Eber Hong  . TONSILLECTOMY  1935    Allergies  Allergen Reactions  . Ace Inhibitors Other (See Comments)    Unknown reaction  . Almond Oil Rash  . Plum Pulp Rash    Outpatient Encounter Medications  as of 03/11/2021  Medication Sig  . amLODipine (NORVASC) 5 MG tablet Take 5 mg by mouth daily.   Marland Kitchen atorvastatin (LIPITOR) 40 MG tablet Take 1 tablet (40 mg total) by mouth daily at 6 PM.  . calcium carbonate (OSCAL) 1500 (600 Ca) MG TABS tablet Take 600 mg of elemental calcium by mouth daily with breakfast.  . Cholecalciferol (VITAMIN D3) 1000 UNITS CAPS Take 1,000 Units daily by mouth.   . clopidogrel (PLAVIX) 75 MG tablet Take 1 tablet (75 mg total) by mouth daily.  Marland Kitchen donepezil (ARICEPT) 10 MG tablet TAKE ONE TABLET BY MOUTH DAILY TO PRESERVE MEMORY  . memantine (NAMENDA) 10 MG tablet Take 1 tablet (10 mg total) by mouth 2 (two) times daily.  . Omega-3 Fatty Acids (FISH OIL) 500 MG CAPS Take 1 capsule by mouth daily.  . pantoprazole (PROTONIX) 40 MG tablet Take 1 tablet (40 mg total) by mouth daily.  . sertraline (ZOLOFT) 50 MG tablet Take 50 mg by mouth daily.  Marland Kitchen triamcinolone cream (KENALOG) 0.1 % Apply 1 application topically 2 (two) times daily as needed.  . zinc oxide 20 % ointment Apply 1 application topically as needed for irritation. Apply to peri/buttocks after every incontinent episode   No facility-administered encounter medications on file as of 03/11/2021.    Review of Systems:  Review of Systems  Unable to perform ROS: Dementia    Health Maintenance  Topic Date Due  .  FOOT EXAM  Never done  . OPHTHALMOLOGY EXAM  Never done  . URINE MICROALBUMIN  Never done  . DEXA SCAN  Never done  . HEMOGLOBIN A1C  07/29/2021  . TETANUS/TDAP  12/14/2023  . INFLUENZA VACCINE  Completed  . COVID-19 Vaccine  Completed  . PNA vac Low Risk Adult  Completed  . HPV VACCINES  Aged Out    Physical Exam: Vitals:   03/11/21 1250  BP: 125/76  Pulse: 80  Resp: 19  Temp: 97.6 F (36.4 C)  SpO2: 95%  Weight: 148 lb 1.6 oz (67.2 kg)  Height: 5\' 2"  (1.575 m)   Body mass index is 27.09 kg/m. Physical Exam Vitals reviewed.  Constitutional:      Appearance: Normal appearance.  HENT:      Head: Normocephalic.     Nose: Nose normal.     Mouth/Throat:     Mouth: Mucous membranes are moist.     Pharynx: Oropharynx is clear.  Eyes:     Pupils: Pupils are equal, round, and reactive to light.  Pulmonary:     Effort: Pulmonary effort is normal.     Breath sounds: Normal breath sounds.  Abdominal:     General: Abdomen is flat. Bowel sounds are normal.     Palpations: Abdomen is soft.  Musculoskeletal:        General: No swelling.  Skin:    General: Skin is warm.     Comments: Doe shave few places where she has picked her skin  Neurological:     General: No focal deficit present.     Mental Status: She is alert.     Comments: Alert Pleasantly confused  Psychiatric:        Mood and Affect: Mood normal.        Thought Content: Thought content normal.     Labs reviewed: Basic Metabolic Panel: Recent Labs    04/03/20 0000 01/26/21 0000 01/29/21 0000  NA 143 139 145  K 4.0 4.7 4.5  CL 106 102 109*  CO2 29* 31* 30*  BUN 23* 24* 27*  CREATININE 1.0 1.2* 1.0  CALCIUM 9.1 8.8 8.9  TSH 2.39  --   --    Liver Function Tests: Recent Labs    04/03/20 0000 01/26/21 0000 01/29/21 0000  AST 13 13 13   ALT 15 12 17   ALKPHOS 70 55 79  ALBUMIN 3.6 3.1* 3.5   No results for input(s): LIPASE, AMYLASE in the last 8760 hours. No results for input(s): AMMONIA in the last 8760 hours. CBC: Recent Labs    04/03/20 0000 01/26/21 0000 01/29/21 0000  WBC 7.9 8.2 8.0  NEUTROABS 4,645 5,150.00 4,384.00  HGB 11.4* 11.5* 10.6*  HCT 35* 34* 33*  PLT 228 360 229   Lipid Panel: Recent Labs    04/03/20 0000 01/26/21 0000  CHOL 131 138  HDL 43 43  LDLCALC 72 78  TRIG 80 83   Lab Results  Component Value Date   HGBA1C 6.0 01/29/2021    Procedures since last visit: No results found.  Assessment/Plan Anemia, unspecified type Hgb 10.6 Repeat CBC,Iron studies and Vit B12  Prediabetes A1C 6  Primary hypertension Good control  Vascular dementia without  behavioral disturbance (HCC) Supportive care Gained weight Doing well staying stable  Depression with anxiety On Zoloft Cerebrovascular accident (CVA) due to thrombosis of basilar artery (HCC) Stable on Plavix Other hyperlipidemia Continue Statin LDL 78 Skin picking habit Better on higher dose of Zoloft  Labs/tests ordered:  * No order type specified * Next appt:  Visit date not found

## 2021-03-12 DIAGNOSIS — R7989 Other specified abnormal findings of blood chemistry: Secondary | ICD-10-CM | POA: Diagnosis not present

## 2021-03-12 DIAGNOSIS — D510 Vitamin B12 deficiency anemia due to intrinsic factor deficiency: Secondary | ICD-10-CM | POA: Diagnosis not present

## 2021-03-12 DIAGNOSIS — E611 Iron deficiency: Secondary | ICD-10-CM | POA: Diagnosis not present

## 2021-03-12 DIAGNOSIS — D649 Anemia, unspecified: Secondary | ICD-10-CM | POA: Diagnosis not present

## 2021-03-13 ENCOUNTER — Encounter: Payer: Self-pay | Admitting: Nurse Practitioner

## 2021-03-13 DIAGNOSIS — D638 Anemia in other chronic diseases classified elsewhere: Secondary | ICD-10-CM | POA: Insufficient documentation

## 2021-03-13 LAB — CBC AND DIFFERENTIAL
HCT: 36 (ref 36–46)
Hemoglobin: 11.3 — AB (ref 12.0–16.0)
Neutrophils Absolute: 6090
Platelets: 242 (ref 150–399)
WBC: 10.1

## 2021-03-13 LAB — CBC: RBC: 3.76 — AB (ref 3.87–5.11)

## 2021-03-13 LAB — IRON,TIBC AND FERRITIN PANEL
Ferritin: 15
Iron: 50

## 2021-03-13 LAB — VITAMIN B12: Vitamin B-12: 313

## 2021-03-16 LAB — VITAMIN B12: Vitamin B-12: 313

## 2021-03-16 LAB — CBC AND DIFFERENTIAL
HCT: 36 (ref 36–46)
Hemoglobin: 11.3 — AB (ref 12.0–16.0)
Neutrophils Absolute: 6090
Platelets: 242 (ref 150–399)
WBC: 10.1

## 2021-03-16 LAB — IRON,TIBC AND FERRITIN PANEL
Ferritin: 15
Iron: 50

## 2021-03-16 LAB — CBC: RBC: 3.76 — AB (ref 3.87–5.11)

## 2021-03-18 ENCOUNTER — Non-Acute Institutional Stay (SKILLED_NURSING_FACILITY): Payer: PPO | Admitting: Nurse Practitioner

## 2021-03-18 DIAGNOSIS — I6302 Cerebral infarction due to thrombosis of basilar artery: Secondary | ICD-10-CM

## 2021-03-18 DIAGNOSIS — I10 Essential (primary) hypertension: Secondary | ICD-10-CM

## 2021-03-18 DIAGNOSIS — F0150 Vascular dementia without behavioral disturbance: Secondary | ICD-10-CM

## 2021-03-18 DIAGNOSIS — R269 Unspecified abnormalities of gait and mobility: Secondary | ICD-10-CM | POA: Diagnosis not present

## 2021-03-18 DIAGNOSIS — F418 Other specified anxiety disorders: Secondary | ICD-10-CM | POA: Diagnosis not present

## 2021-03-18 DIAGNOSIS — K219 Gastro-esophageal reflux disease without esophagitis: Secondary | ICD-10-CM

## 2021-03-18 DIAGNOSIS — R7303 Prediabetes: Secondary | ICD-10-CM | POA: Diagnosis not present

## 2021-03-18 DIAGNOSIS — D638 Anemia in other chronic diseases classified elsewhere: Secondary | ICD-10-CM

## 2021-03-20 ENCOUNTER — Encounter: Payer: Self-pay | Admitting: Nurse Practitioner

## 2021-03-20 NOTE — Assessment & Plan Note (Signed)
Hgb 11.3 Iron 50, Vit B12 313 03/12/21

## 2021-03-20 NOTE — Assessment & Plan Note (Signed)
thrombosis of basilar artery, takes Plavix, statin, slightly RLE weakness.

## 2021-03-20 NOTE — Assessment & Plan Note (Signed)
skin picking habit,increasedSertraline 50mg  qd12/29/21

## 2021-03-20 NOTE — Assessment & Plan Note (Signed)
uses w/c for mobility, transfer with supervision/assistance.  

## 2021-03-20 NOTE — Assessment & Plan Note (Addendum)
stable, on Pantoprazole 40mg  qd.  Hgb 11.3 03/12/21

## 2021-03-20 NOTE — Progress Notes (Signed)
Location:   [facility] Room Number: 18 Place of Service:  SNF (31) Provider: [facility] Wren Gallaga NP  [name], MD  Patient Care Team: [name], MD as PCP - General (Internal Medicine) Ngetich, Donalee Citrin, NP as Nurse Practitioner [facility])  Extended Emergency Contact Information Primary Emergency Contact: Gordan,[place] Address: [address]          [place], [place] [address] [place] of [place] Home Phone: [phone] Relation: Daughter Secondary Emergency Contact: Iven Finn Address: [address]          [place], [place] [address] [place] of [place] Home Phone: [phone] Relation: Spouse  Code Status:  DNR Goals of care: Advanced Directive information Advanced Directives 12/19/2020  Does Patient Have a Medical Advance Directive? Yes  Type of Estate agent of [place];Out of facility DNR (pink MOST or yellow form)  Does patient want to make changes to medical advance directive? No - Patient declined  Copy of Healthcare Power of Attorney in Chart? Yes - validated most recent copy scanned in chart (See row information)  Pre-existing out of facility DNR order (yellow form or pink MOST form) Yellow form placed in chart (order not valid for inpatient use)     Chief Complaint  Patient presents with  . Medical Management of Chronic Issues    HPI:  Pt is a 87 y.o. female seen today for medical management of chronic diseases.    GERD, stable, on Pantoprazole 40mg  qd. Hgb 11.3 03/12/21 HTN, takesamlodipine 5mg , Atorvastatin 40mg  qd, Bun/creat 27/1.0 01/29/21 Dementia, an resident of SNF [facility], takesMenmantine 10mg  bid, Donepezil 10mg  qd for memory, TSH 2.39 04/03/20 Depression, skin picking habit,increasedSertraline 50mg  qd12/29/21 Hx of CVA, thrombosis of basilar artery, takes Plavix, statin, slightly RLE weakness. Gait abnormality, uses w/c for mobility,  transfer with supervision/assistance.             Prediabetes, needs foot/eye exam, urine micro albumin,  DEXA. Hgb a1c 6.0 02/08/21  Anemia, Hgb 11.3 Iron 50, Vit B12 313 03/12/21   Past Medical History:  Diagnosis Date  . Allergic rhinitis   . Anxiety   . Balance problem 04/28/2015  . Fatigue 04/28/2015  . Hearing loss 04/28/2015  . history of Fracture of right humerus 04/28/2015  . HLD (hyperlipidemia) 03/05/2019  . Hyperglycemia   . Hyperlipidemia   . Hypertension   . Memory loss 04/28/2015   04/26/2014 MMSE 28/30. Failed clock drawing. 04/21/15 MMSE 21/30. Failed clock drawing.   . Osteoporosis    Past Surgical History:  Procedure Laterality Date  . ABDOMINAL HYSTERECTOMY    . CATARACT EXTRACTION  2010  . ELBOW SURGERY Right 1988  . FEMUR FRACTURE SURGERY Right 06/2014   [place], [place]/07/2019 Dr. 04/30/2015  . HUMERUS FRACTURE SURGERY Right 2015   [place], [place] Dr. 04/28/2014  . TONSILLECTOMY  1935    Allergies  Allergen Reactions  . Ace Inhibitors Other (See Comments)    Unknown reaction  . Almond Oil Rash  . Plum Pulp Rash    Allergies as of 03/18/2021      Reactions   Ace Inhibitors Other (See Comments)   Unknown reaction   Almond Oil Rash   Plum Pulp Rash      Medication List       Accurate as of March 18, 2021 11:59 PM. If you have any questions, ask your nurse or doctor.        amLODipine 5 MG tablet Commonly known as: NORVASC Take  5 mg by mouth daily.   atorvastatin 40 MG tablet Commonly known as: LIPITOR Take 1 tablet (40 mg total) by mouth daily at 6 PM.   calcium carbonate 1500 (600 Ca) MG Tabs tablet Commonly known as: OSCAL Take 600 mg of elemental calcium by mouth daily with breakfast.   clopidogrel 75 MG tablet Commonly known as: PLAVIX Take 1 tablet (75 mg total) by mouth daily.   donepezil 10 MG tablet Commonly known as: ARICEPT TAKE ONE TABLET BY MOUTH DAILY TO PRESERVE MEMORY   Fish Oil 500 MG Caps Take 1 capsule by mouth daily.    memantine 10 MG tablet Commonly known as: Namenda Take 1 tablet (10 mg total) by mouth 2 (two) times daily.   pantoprazole 40 MG tablet Commonly known as: Protonix Take 1 tablet (40 mg total) by mouth daily.   sertraline 50 MG tablet Commonly known as: ZOLOFT Take 50 mg by mouth daily.   triamcinolone cream 0.1 % Commonly known as: KENALOG Apply 1 application topically 2 (two) times daily as needed.   Vitamin D3 25 MCG (1000 UT) Caps Take 1,000 Units daily by mouth.   zinc oxide 20 % ointment Apply 1 application topically as needed for irritation. Apply to peri/buttocks after every incontinent episode       Review of Systems  Constitutional: Negative for activity change, appetite change, fatigue and fever.  HENT: Positive for hearing loss. Negative for congestion and voice change.   Eyes: Negative for visual disturbance.  Respiratory: Negative for cough.   Cardiovascular: Positive for leg swelling.  Gastrointestinal: Negative for abdominal pain and constipation.  Genitourinary: Negative for dysuria and urgency.  Musculoskeletal: Positive for arthralgias and gait problem.  Skin: Negative for color change.       Scratched injuries face, upper chest, arms.   Neurological: Negative for speech difficulty and light-headedness.       Dementia.   Psychiatric/Behavioral: Positive for confusion. Negative for sleep disturbance. The patient is not nervous/anxious.     Immunization History  Administered Date(s) Administered  . DTaP 05/12/2014  . Influenza, High Dose Seasonal PF 09/21/2017, 09/27/2019, 09/16/2020  . Influenza,inj,Quad PF,6+ Mos 09/14/2018  . Influenza-Unspecified 10/07/2014, 09/11/2015, 09/23/2016  . Moderna Sars-Covid-2 Vaccination 12/17/2019, 01/14/2020, 10/27/2020  . PPD Test 10/16/2014  . Pneumococcal Conjugate-13 10/20/2017  . Pneumococcal Polysaccharide-23 08/20/2011   Pertinent  Health Maintenance Due  Topic Date Due  . FOOT EXAM  Never done  .  OPHTHALMOLOGY EXAM  Never done  . URINE MICROALBUMIN  Never done  . DEXA SCAN  Never done  . INFLUENZA VACCINE  07/13/2021  . HEMOGLOBIN A1C  07/29/2021  . PNA vac Low Risk Adult  Completed   Fall Risk  11/21/2018 10/14/2017 02/17/2016 01/06/2015  Falls in the past year? 0 No No Yes  Comment - - - fx (R) humerus; fx (R) femur  Number falls in past yr: 0 - - 2 or more  Injury with Fall? 0 - - -  Risk for fall due to : - - - History of fall(s)   Functional Status Survey:    Vitals:   03/18/21 1226  BP: 130/69  Pulse: 74  Resp: 20  Temp: 97.8 F (36.6 C)  SpO2: 94%   There is no height or weight on file to calculate BMI. Physical Exam Vitals and nursing note reviewed.  Constitutional:      Appearance: Normal appearance.  HENT:     Head: Normocephalic and atraumatic.     Mouth/Throat:  Mouth: Mucous membranes are moist.  Eyes:     Extraocular Movements: Extraocular movements intact.     Conjunctiva/sclera: Conjunctivae normal.     Pupils: Pupils are equal, round, and reactive to light.  Cardiovascular:     Rate and Rhythm: Normal rate and regular rhythm.     Heart sounds: No murmur heard.   Pulmonary:     Breath sounds: No rales.  Abdominal:     General: Bowel sounds are normal.     Palpations: Abdomen is soft.     Tenderness: There is no abdominal tenderness.  Musculoskeletal:     Cervical back: Normal range of motion and neck supple.     Right lower leg: Edema present.     Left lower leg: Edema present.     Comments: Trace edema BLE  Skin:    General: Skin is warm and dry.     Comments: Scratched injuries face, upper chest, arms. No s/s of infection.    Neurological:     General: No focal deficit present.     Mental Status: She is alert. Mental status is at baseline.     Gait: Gait abnormal.     Comments: Oriented to self. Slightly weakness in RLE  Psychiatric:        Mood and Affect: Mood normal.        Behavior: Behavior normal.     Labs  reviewed: Recent Labs    04/03/20 0000 01/26/21 0000 01/29/21 0000  NA 143 139 145  K 4.0 4.7 4.5  CL 106 102 109*  CO2 29* 31* 30*  BUN 23* 24* 27*  CREATININE 1.0 1.2* 1.0  CALCIUM 9.1 8.8 8.9   Recent Labs    04/03/20 0000 01/26/21 0000 01/29/21 0000  AST 13 13 13   ALT 15 12 17   ALKPHOS 70 55 79  ALBUMIN 3.6 3.1* 3.5   Recent Labs    04/03/20 0000 01/26/21 0000 01/29/21 0000  WBC 7.9 8.2 8.0  NEUTROABS 4,645 5,150.00 4,384.00  HGB 11.4* 11.5* 10.6*  HCT 35* 34* 33*  PLT 228 360 229   Lab Results  Component Value Date   TSH 2.39 04/03/2020   Lab Results  Component Value Date   HGBA1C 6.0 01/29/2021   Lab Results  Component Value Date   CHOL 138 01/26/2021   HDL 43 01/26/2021   LDLCALC 78 01/26/2021   TRIG 83 01/26/2021   CHOLHDL 6.3 03/06/2019    Significant Diagnostic Results in last 30 days:  No results found.  Assessment/Plan GERD (gastroesophageal reflux disease) stable, on Pantoprazole 40mg  qd.  Hgb 11.3 03/12/21   Hypertension takesamlodipine 5mg , Atorvastatin 40mg  qd, Bun/creat 27/1.0 01/29/21   Vascular dementia [facility]) an resident of SNF [facility], takesMenmantine 10mg  bid, Donepezil 10mg  qd for memory, TSH 2.39 04/03/20   Depression with anxiety  skin picking habit,increasedSertraline 50mg  qd12/29/21  Stroke (cerebrum) (HCC) thrombosis of basilar artery, takes Plavix, statin, slightly RLE weakness.   Gait abnormality uses w/c for mobility, transfer with supervision/assistance.  Prediabetes needs foot/eye exam, urine micro albumin,  DEXA. Hgb a1c 6.0 02/08/21   Anemia, chronic disease Hgb 11.3 Iron 50, Vit B12 313 03/12/21    Family/ staff Communication: plan of care reviewed with the patient and charge nurse.   Labs/tests ordered: none  Time spend 35 minutes.

## 2021-03-20 NOTE — Assessment & Plan Note (Signed)
needs foot/eye exam, urine micro albumin,  DEXA. Hgb a1c 6.0 02/08/21

## 2021-03-20 NOTE — Assessment & Plan Note (Signed)
an resident of SNF [facility], takesMenmantine 10mg  bid, Donepezil 10mg  qd for memory, TSH 2.39 04/03/20

## 2021-03-20 NOTE — Assessment & Plan Note (Signed)
takesamlodipine 5mg , Atorvastatin 40mg  qd, Bun/creat 27/1.0 01/29/21

## 2021-04-07 ENCOUNTER — Encounter: Payer: Self-pay | Admitting: Nurse Practitioner

## 2021-04-07 ENCOUNTER — Non-Acute Institutional Stay (SKILLED_NURSING_FACILITY): Payer: PPO | Admitting: Nurse Practitioner

## 2021-04-07 DIAGNOSIS — D638 Anemia in other chronic diseases classified elsewhere: Secondary | ICD-10-CM

## 2021-04-07 DIAGNOSIS — F418 Other specified anxiety disorders: Secondary | ICD-10-CM | POA: Diagnosis not present

## 2021-04-07 DIAGNOSIS — I6302 Cerebral infarction due to thrombosis of basilar artery: Secondary | ICD-10-CM | POA: Diagnosis not present

## 2021-04-07 DIAGNOSIS — R7303 Prediabetes: Secondary | ICD-10-CM | POA: Diagnosis not present

## 2021-04-07 DIAGNOSIS — K219 Gastro-esophageal reflux disease without esophagitis: Secondary | ICD-10-CM | POA: Diagnosis not present

## 2021-04-07 DIAGNOSIS — R269 Unspecified abnormalities of gait and mobility: Secondary | ICD-10-CM | POA: Diagnosis not present

## 2021-04-07 DIAGNOSIS — I10 Essential (primary) hypertension: Secondary | ICD-10-CM

## 2021-04-07 DIAGNOSIS — F0150 Vascular dementia without behavioral disturbance: Secondary | ICD-10-CM | POA: Diagnosis not present

## 2021-04-07 NOTE — Assessment & Plan Note (Signed)
stable, on Pantoprazole 40mg qd.  Hgb 11.3 03/12/21  

## 2021-04-07 NOTE — Assessment & Plan Note (Signed)
an resident of SNF [facility], no behavioral issues presently, takesMenmantine 10mg  bid, Donepezil 10mg  qd for memory, TSH 2.39 04/03/20

## 2021-04-07 NOTE — Assessment & Plan Note (Signed)
Stable, Hgb 11.3 Iron 50, Vit B12 313 03/12/21

## 2021-04-07 NOTE — Assessment & Plan Note (Signed)
Blood pressure is controlled, continueamlodipine 5mg , Atorvastatin 40mg  qd, Bun/creat 27/1.0 01/29/21

## 2021-04-07 NOTE — Assessment & Plan Note (Signed)
Gait abnormality, uses w/c for mobility, transfer with supervision/assistance.

## 2021-04-07 NOTE — Assessment & Plan Note (Signed)
skin picking habit, stabilizing, increasedSertraline 50mg  qd12/29/21

## 2021-04-07 NOTE — Assessment & Plan Note (Signed)
Hx of CVA, thrombosis of basilar artery, takes Plavix, statin, slightly RLE weakness.  

## 2021-04-07 NOTE — Progress Notes (Signed)
Location:   [facility] Room Number: 18 Place of Service:  SNF (31) Provider: [facility] Madeeha Costantino NP  [name], MD  Patient Care Team: [name], MD as PCP - General (Internal Medicine) Ngetich, Donalee Citrin, NP as Nurse Practitioner [facility] Medicine)  Extended Emergency Contact Information Primary Emergency Contact: Gordan,[place] Address: [address]          [place], [place] [address] [place] of [place] Home Phone: [phone] Relation: Daughter Secondary Emergency Contact: Iven Finn Address: [address]          [place], [place] [address] [place] Home Phone: [phone] Relation: Spouse  Code Status:  DNR Goals of care: Advanced Directive information Advanced Directives 12/19/2020  Does Patient Have a Medical Advance Directive? Yes  Type of Estate agent of [place];Out of facility DNR (pink MOST or yellow form)  Does patient want to make changes to medical advance directive? No - Patient declined  Copy of Healthcare Power of Attorney in Chart? Yes - validated most recent copy scanned in chart (See row information)  Pre-existing out of facility DNR order (yellow form or pink MOST form) Yellow form placed in chart (order not valid for inpatient use)     Chief Complaint  Patient presents with  . Medical Management of Chronic Issues    HPI:  Pt is a 87 y.o. female seen today for medical management of chronic diseases.    GERD, stable, on Pantoprazole 40mg  qd.Hgb 11.3 03/12/21 HTN, takesamlodipine 5mg , Atorvastatin 40mg  qd, Bun/creat 27/1.0 01/29/21 Dementia, an resident of SNF [facility], takesMenmantine 10mg  bid, Donepezil 10mg  qd for memory, TSH 2.39 04/03/20 Depression, skin picking habit,increasedSertraline 50mg  qd12/29/21 Hx of CVA, thrombosis of basilar artery, takes Plavix, statin, slightly RLE weakness. Gait abnormality, uses w/c for mobility,  transfer with supervision/assistance. Prediabetes, needs foot/eye exam, urine micro albumin,  DEXA. Hgb a1c 6.0 02/08/21             Anemia, Hgb 11.3 Iron 50, Vit B12 313 03/12/21     Past Medical History:  Diagnosis Date  . Allergic rhinitis   . Anxiety   . Balance problem 04/28/2015  . Fatigue 04/28/2015  . Hearing loss 04/28/2015  . history of Fracture of right humerus 04/28/2015  . HLD (hyperlipidemia) 03/05/2019  . Hyperglycemia   . Hyperlipidemia   . Hypertension   . Memory loss 04/28/2015   04/26/2014 MMSE 28/30. Failed clock drawing. 04/21/15 MMSE 21/30. Failed clock drawing.   . Osteoporosis    Past Surgical History:  Procedure Laterality Date  . ABDOMINAL HYSTERECTOMY    . CATARACT EXTRACTION  2010  . ELBOW SURGERY Right 1988  . FEMUR FRACTURE SURGERY Right 06/2014   [place] Dr. 04/30/2015  . HUMERUS FRACTURE SURGERY Right 2015   [place] Dr[place] 04/28/2014  . TONSILLECTOMY  1935    Allergies  Allergen Reactions  . Ace Inhibitors Other (See Comments)    Unknown reaction  . Almond Oil Rash  . Plum Pulp Rash    Allergies as of 04/07/2021      Reactions   Ace Inhibitors Other (See Comments)   Unknown reaction   Almond Oil Rash   Plum Pulp Rash      Medication List       Accurate as of April 07, 2021 11:59 PM. If you have any questions, ask your nurse or doctor.        amLODipine 5 MG tablet Commonly known as: NORVASC Take  5 mg by mouth daily.   atorvastatin 40 MG tablet Commonly known as: LIPITOR Take 1 tablet (40 mg total) by mouth daily at 6 PM.   calcium carbonate 1500 (600 Ca) MG Tabs tablet Commonly known as: OSCAL Take 600 mg of elemental calcium by mouth daily with breakfast.   clopidogrel 75 MG tablet Commonly known as: PLAVIX Take 1 tablet (75 mg total) by mouth daily.   donepezil 10 MG tablet Commonly known as: ARICEPT TAKE ONE TABLET BY MOUTH DAILY TO PRESERVE MEMORY   Fish Oil 500 MG Caps Take 1 capsule  by mouth daily.   memantine 10 MG tablet Commonly known as: Namenda Take 1 tablet (10 mg total) by mouth 2 (two) times daily.   pantoprazole 40 MG tablet Commonly known as: Protonix Take 1 tablet (40 mg total) by mouth daily.   sertraline 50 MG tablet Commonly known as: ZOLOFT Take 50 mg by mouth daily.   triamcinolone cream 0.1 % Commonly known as: KENALOG Apply 1 application topically 2 (two) times daily as needed.   Vitamin D3 25 MCG (1000 UT) Caps Take 1,000 Units daily by mouth.   zinc oxide 20 % ointment Apply 1 application topically as needed for irritation. Apply to peri/buttocks after every incontinent episode       Review of Systems  Constitutional: Negative for appetite change, fatigue and fever.  HENT: Positive for hearing loss. Negative for congestion and voice change.   Eyes: Negative for visual disturbance.  Respiratory: Negative for cough.   Cardiovascular: Negative for leg swelling.  Gastrointestinal: Negative for abdominal pain and constipation.  Genitourinary: Negative for dysuria and urgency.  Musculoskeletal: Positive for arthralgias and gait problem.  Skin: Negative for color change.       Scratched injuries face, upper chest, arms.   Neurological: Negative for speech difficulty and light-headedness.       Dementia.   Psychiatric/Behavioral: Positive for confusion. Negative for sleep disturbance. The patient is not nervous/anxious.     Immunization History  Administered Date(s) Administered  . DTaP 05/12/2014  . Influenza, High Dose Seasonal PF 09/21/2017, 09/27/2019, 09/16/2020  . Influenza,inj,Quad PF,6+ Mos 09/14/2018  . Influenza-Unspecified 10/07/2014, 09/11/2015, 09/23/2016  . Moderna Sars-Covid-2 Vaccination 12/17/2019, 01/14/2020, 10/27/2020  . PPD Test 10/16/2014  . Pneumococcal Conjugate-13 10/20/2017  . Pneumococcal Polysaccharide-23 08/20/2011   Pertinent  Health Maintenance Due  Topic Date Due  . FOOT EXAM  Never done  .  OPHTHALMOLOGY EXAM  Never done  . URINE MICROALBUMIN  Never done  . DEXA SCAN  Never done  . INFLUENZA VACCINE  07/13/2021  . HEMOGLOBIN A1C  07/29/2021  . PNA vac Low Risk Adult  Completed   Fall Risk  11/21/2018 10/14/2017 02/17/2016 01/06/2015  Falls in the past year? 0 No No Yes  Comment - - - fx (R) humerus; fx (R) femur  Number falls in past yr: 0 - - 2 or more  Injury with Fall? 0 - - -  Risk for fall due to : - - - History of fall(s)   Functional Status Survey:    Vitals:   04/07/21 1206  BP: 127/87  Pulse: 74  Resp: 19  Temp: (!) 97.1 F (36.2 C)  SpO2: 94%   There is no height or weight on file to calculate BMI. Physical Exam Vitals and nursing note reviewed.  Constitutional:      Appearance: Normal appearance.  HENT:     Head: Normocephalic and atraumatic.     Mouth/Throat:  Mouth: Mucous membranes are moist.  Eyes:     Extraocular Movements: Extraocular movements intact.     Conjunctiva/sclera: Conjunctivae normal.     Pupils: Pupils are equal, round, and reactive to light.  Cardiovascular:     Rate and Rhythm: Normal rate and regular rhythm.     Heart sounds: No murmur heard.   Pulmonary:     Breath sounds: No rales.  Abdominal:     General: Bowel sounds are normal.     Palpations: Abdomen is soft.     Tenderness: There is no abdominal tenderness.  Genitourinary:    Rectum: Guaiac result positive.  Musculoskeletal:     Cervical back: Normal range of motion and neck supple.     Right lower leg: No edema.     Left lower leg: No edema.  Skin:    General: Skin is warm and dry.     Comments: Scratched injuries R face, arms, legs. No s/s of infection.    Neurological:     General: No focal deficit present.     Mental Status: She is alert. Mental status is at baseline.     Gait: Gait abnormal.     Comments: Oriented to self. Slightly weakness in RLE  Psychiatric:        Mood and Affect: Mood normal.        Behavior: Behavior normal.     Labs  reviewed: Recent Labs    01/26/21 0000 01/29/21 0000  NA 139 145  K 4.7 4.5  CL 102 109*  CO2 31* 30*  BUN 24* 27*  CREATININE 1.2* 1.0  CALCIUM 8.8 8.9   Recent Labs    01/26/21 0000 01/29/21 0000  AST 13 13  ALT 12 17  ALKPHOS 55 79  ALBUMIN 3.1* 3.5   Recent Labs    01/26/21 0000 01/29/21 0000  WBC 8.2 8.0  NEUTROABS 5,150.00 4,384.00  HGB 11.5* 10.6*  HCT 34* 33*  PLT 360 229   Lab Results  Component Value Date   TSH 2.39 04/03/2020   Lab Results  Component Value Date   HGBA1C 6.0 01/29/2021   Lab Results  Component Value Date   CHOL 138 01/26/2021   HDL 43 01/26/2021   LDLCALC 78 01/26/2021   TRIG 83 01/26/2021   CHOLHDL 6.3 03/06/2019    Significant Diagnostic Results in last 30 days:  No results found.  Assessment/Plan Hypertension Blood pressure is controlled, continueamlodipine 5mg , Atorvastatin 40mg  qd, Bun/creat 27/1.0 01/29/21   Vascular dementia [facility]) an resident of SNF [facility], no behavioral issues presently, takesMenmantine 10mg  bid, Donepezil 10mg  qd for memory, TSH 2.39 04/03/20   Depression with anxiety skin picking habit, stabilizing, increasedSertraline 50mg  qd12/29/21  Stroke (cerebrum) (HCC) Hx of CVA, thrombosis of basilar artery, takes Plavix, statin, slightly RLE weakness.  Gait abnormality Gait abnormality, uses w/c for mobility, transfer with supervision/assistance.   Prediabetes Prediabetes, needs foot/eye exam, urine micro albumin,  DEXA. Hgb a1c 6.0 02/08/21   Anemia, chronic disease Stable, Hgb 11.3 Iron 50, Vit B12 313 03/12/21   GERD (gastroesophageal reflux disease)  stable, on Pantoprazole 40mg  qd.Hgb 11.3 03/12/21   Family/ staff Communication: plan of care reviewed with the patient and charge nurse.   Labs/tests ordered: none  Time spend 35 minutes.

## 2021-04-07 NOTE — Assessment & Plan Note (Signed)
Prediabetes, needs foot/eye exam, urine micro albumin,  DEXA. Hgb a1c 6.0 02/08/21

## 2021-04-08 ENCOUNTER — Encounter: Payer: Self-pay | Admitting: Nurse Practitioner

## 2021-05-04 ENCOUNTER — Encounter: Payer: Self-pay | Admitting: Orthopedic Surgery

## 2021-05-04 ENCOUNTER — Non-Acute Institutional Stay (SKILLED_NURSING_FACILITY): Payer: PPO | Admitting: Orthopedic Surgery

## 2021-05-04 DIAGNOSIS — R269 Unspecified abnormalities of gait and mobility: Secondary | ICD-10-CM

## 2021-05-04 DIAGNOSIS — K219 Gastro-esophageal reflux disease without esophagitis: Secondary | ICD-10-CM | POA: Diagnosis not present

## 2021-05-04 DIAGNOSIS — F419 Anxiety disorder, unspecified: Secondary | ICD-10-CM

## 2021-05-04 DIAGNOSIS — I10 Essential (primary) hypertension: Secondary | ICD-10-CM | POA: Diagnosis not present

## 2021-05-04 DIAGNOSIS — U071 COVID-19: Secondary | ICD-10-CM | POA: Diagnosis not present

## 2021-05-04 DIAGNOSIS — D638 Anemia in other chronic diseases classified elsewhere: Secondary | ICD-10-CM | POA: Diagnosis not present

## 2021-05-04 DIAGNOSIS — R296 Repeated falls: Secondary | ICD-10-CM | POA: Diagnosis not present

## 2021-05-04 DIAGNOSIS — R7303 Prediabetes: Secondary | ICD-10-CM | POA: Diagnosis not present

## 2021-05-04 DIAGNOSIS — E7849 Other hyperlipidemia: Secondary | ICD-10-CM

## 2021-05-04 DIAGNOSIS — I6302 Cerebral infarction due to thrombosis of basilar artery: Secondary | ICD-10-CM | POA: Diagnosis not present

## 2021-05-04 DIAGNOSIS — F0150 Vascular dementia without behavioral disturbance: Secondary | ICD-10-CM

## 2021-05-04 DIAGNOSIS — F418 Other specified anxiety disorders: Secondary | ICD-10-CM | POA: Diagnosis not present

## 2021-05-04 NOTE — Progress Notes (Signed)
Location:   [facility] Room Number: N18 Place of Service:  SNF [phone]) Provider:  Hazle NordmannAmy Ivis Henneman, NP  Mahlon GammonGupta, Anjali L, MD  Patient Care Team: Mahlon GammonGupta, Anjali L, MD as PCP - General (Internal Medicine) Ngetich, Donalee Citrininah C, NP as Nurse Practitioner [facility](Family Medicine)  Extended Emergency Contact Information Primary Emergency Contact: Gordan,[place] Address: [address]          [place], [place] [address] [place] Home Phone: [phone] Relation: Daughter Secondary Emergency Contact: Iven FinnGarrigan,Donald Address: [address]          [place], [place] [address] [place] Home Phone: [phone] Relation: Spouse  Code Status:  DNR Goals of care: Advanced Directive information Advanced Directives 05/04/2021  Does Patient Have a Medical Advance Directive? Yes  Type of Estate agentAdvance Directive Healthcare Power of [place];Living will;Out of facility DNR (pink MOST or yellow form)  Does patient want to make changes to medical advance directive? No - Patient declined  Copy of Healthcare Power of Attorney in Chart? Yes - validated most recent copy scanned in chart (See row information)  Pre-existing out of facility DNR order (yellow form or pink MOST form) Yellow form placed in chart (order not valid for inpatient use)     Chief Complaint  Patient presents with  . Medical Management of Chronic Issues    Routine follow up.   Marland Kitchen. Health Maintenance    Discuss the need for a foot exam, ophthalmology exam, urine microalbumin, and a DEXA scan.     HPI:  Pt is a 87 y.o. female seen today for medical management of chronic diseases.   [facility] and chronic conditions. Past medical conditions include: hypertension, stroke, GERD, diabetes, CKD stage 3, hyperlipidemia, depression, anxiety and abnormal gait.   05/09 she tested positive for covid-19 and was placed on droplet precautions. She remained  asymptomatic during her time on isolation.   Hypertension stable with daily amlodipine 5 mg, BUN 27/ creat 1.0.  Anemia stable with hgb 11.3, iron 50, vitamin B 313 03/12/21.  GERD, remains asymptomatic, taking Protonix 40 mg daily.  Dementia, no recent behavioral outbursts, remains on namenda 10 mg bid and donepezil 10 mg daily.  Depression, no recent reports of skin picking, taking sertraline 50 mg daily.  Anxiety, no recent panic attacks, taking sertraline.  Hx of CVA, thrombosis to basilar artery, some right sided weakness, taking Plavix 75 mg daily and atorvastatin 40 mg daily.  Osteoporosis, last fall 04/29 no reported injury, taking calcium carbonate 1500 mg daily and vitamin D 1000 units daily, DEXA needed.  Gait abnormality, continues to use wheelchair for ambulation, remains moderate assist with transfers.  Prediabetes, foot exam performed today, needs urine microalbumin, Hgb A1c 6.0 02/08/2021  Recent blood pressures are as follows:  05/23- 128/71  05/22- 123/70  05/21- 114/62  Recent weights are as follows:  05/02- 145.2 lbs  04/01- 144.5 lbs  03/01- 148.1 lbs  02/01- 145.9 lbs  Today she is very pleasant. She can state the year, month and state she lives in.    Nurse does not report any concerns, vitals stable.    Past Medical History:  Diagnosis Date  . Allergic rhinitis   . Anxiety   . Balance problem 04/28/2015  . Fatigue 04/28/2015  . Hearing loss 04/28/2015  . history of Fracture of right humerus 04/28/2015  . HLD (hyperlipidemia) 03/05/2019  . Hyperglycemia   .  Hyperlipidemia   . Hypertension   . Memory loss 04/28/2015   04/26/2014 MMSE 28/30. Failed clock drawing. 04/21/15 MMSE 21/30. Failed clock drawing.   . Osteoporosis    Past Surgical History:  Procedure Laterality Date  . ABDOMINAL HYSTERECTOMY    . CATARACT EXTRACTION  2010  . ELBOW SURGERY Right 1988  . FEMUR FRACTURE SURGERY Right 06/2014   [place], [place] Dr. Dione Housekeeper  . HUMERUS FRACTURE  SURGERY Right 2015   [place] Dr. Eber Hong  . TONSILLECTOMY  1935    Allergies  Allergen Reactions  . Ace Inhibitors Other (See Comments)    Unknown reaction  . Almond Oil Rash  . Plum Pulp Rash    Allergies as of 05/04/2021      Reactions   Ace Inhibitors Other (See Comments)   Unknown reaction   Almond Oil Rash   Plum Pulp Rash      Medication List       Accurate as of May 04, 2021  9:40 AM. If you have any questions, ask your nurse or doctor.        amLODipine 5 MG tablet Commonly known as: NORVASC Take 5 mg by mouth daily.   atorvastatin 40 MG tablet Commonly known as: LIPITOR Take 1 tablet (40 mg total) by mouth daily at 6 PM.   calcium carbonate 1500 (600 Ca) MG Tabs tablet Commonly known as: OSCAL Take 600 mg of elemental calcium by mouth daily with breakfast.   clopidogrel 75 MG tablet Commonly known as: PLAVIX Take 1 tablet (75 mg total) by mouth daily.   donepezil 10 MG tablet Commonly known as: ARICEPT TAKE ONE TABLET BY MOUTH DAILY TO PRESERVE MEMORY   Fish Oil 500 MG Caps Take 1 capsule by mouth daily.   memantine 10 MG tablet Commonly known as: Namenda Take 1 tablet (10 mg total) by mouth 2 (two) times daily.   pantoprazole 40 MG tablet Commonly known as: Protonix Take 1 tablet (40 mg total) by mouth daily.   sertraline 50 MG tablet Commonly known as: ZOLOFT Take 50 mg by mouth daily.   triamcinolone cream 0.1 % Commonly known as: KENALOG Apply 1 application topically 2 (two) times daily as needed.   Vitamin D3 25 MCG (1000 UT) Caps Take 1,000 Units daily by mouth.   zinc oxide 20 % ointment Apply 1 application topically as needed for irritation. Apply to peri/buttocks after every incontinent episode       Review of Systems  Constitutional: Negative for activity change, appetite change, fatigue and fever.  HENT: Positive for hearing loss. Negative for congestion, sore throat and trouble swallowing.   Eyes: Negative.    Respiratory: Negative for cough, shortness of breath and wheezing.   Cardiovascular: Negative for chest pain and leg swelling.  Gastrointestinal: Negative for abdominal distention, abdominal pain, constipation, diarrhea and nausea.  Genitourinary: Negative for dysuria, frequency and hematuria.  Musculoskeletal: Positive for arthralgias, gait problem and joint swelling.  Skin:       Dry skin, scratch marks  Neurological: Positive for weakness. Negative for dizziness and headaches.  Hematological: Bruises/bleeds easily.  Psychiatric/Behavioral: Positive for confusion. Negative for dysphoric mood and sleep disturbance. The patient is not nervous/anxious.     Immunization History  Administered Date(s) Administered  . DTaP 05/12/2014  . Influenza, High Dose Seasonal PF 09/21/2017, 09/27/2019, 09/16/2020  . Influenza,inj,Quad PF,6+ Mos 09/14/2018  . Influenza-Unspecified 10/07/2014, 09/11/2015, 09/23/2016  . Moderna Sars-Covid-2 Vaccination 12/17/2019, 01/14/2020, 10/27/2020  . PPD Test  10/16/2014  . Pneumococcal Conjugate-13 10/20/2017  . Pneumococcal Polysaccharide-23 08/20/2011   Pertinent  Health Maintenance Due  Topic Date Due  . FOOT EXAM  Never done  . OPHTHALMOLOGY EXAM  Never done  . URINE MICROALBUMIN  Never done  . DEXA SCAN  Never done  . INFLUENZA VACCINE  07/13/2021  . HEMOGLOBIN A1C  07/29/2021  . PNA vac Low Risk Adult  Completed   Fall Risk  11/21/2018 10/14/2017 02/17/2016 01/06/2015  Falls in the past year? 0 No No Yes  Comment - - - fx (R) humerus; fx (R) femur  Number falls in past yr: 0 - - 2 or more  Injury with Fall? 0 - - -  Risk for fall due to : - - - History of fall(s)   Functional Status Survey:    Vitals:   05/04/21 0919  BP: 128/71  Pulse: 72  Resp: 20  Temp: (!) 97.1 F (36.2 C)  SpO2: 95%  Weight: 145 lb 3.2 oz (65.9 kg)  Height: 5\' 2"  (1.575 m)   Body mass index is 26.56 kg/m. Physical Exam Vitals reviewed.  Constitutional:       General: She is not in acute distress. HENT:     Head: Normocephalic.     Right Ear: There is no impacted cerumen.     Left Ear: There is no impacted cerumen.     Nose: Nose normal.     Mouth/Throat:     Pharynx: No posterior oropharyngeal erythema.  Eyes:     General:        Right eye: No discharge.        Left eye: No discharge.  Cardiovascular:     Rate and Rhythm: Normal rate and regular rhythm.     Pulses: Normal pulses.     Heart sounds: Normal heart sounds. No murmur heard.   Pulmonary:     Effort: Pulmonary effort is normal. No respiratory distress.     Breath sounds: Normal breath sounds. No wheezing.  Abdominal:     General: Bowel sounds are normal. There is no distension.     Palpations: Abdomen is soft.     Tenderness: There is no abdominal tenderness.  Musculoskeletal:     Cervical back: Normal range of motion.     Right lower leg: No edema.     Left lower leg: No edema.  Feet:     Right foot:     Protective Sensation: 10 sites tested. 10 sites sensed.     Skin integrity: Dry skin present.     Toenail Condition: Right toenails are abnormally thick.     Left foot:     Protective Sensation: 10 sites tested. 10 sites sensed.     Skin integrity: Dry skin present.     Toenail Condition: Left toenails are abnormally thick.  Lymphadenopathy:     Cervical: No cervical adenopathy.  Skin:    General: Skin is warm and dry.     Capillary Refill: Capillary refill takes less than 2 seconds.  Neurological:     General: No focal deficit present.     Mental Status: She is alert. Mental status is at baseline.     Motor: Weakness present.     Gait: Gait abnormal.  Psychiatric:        Mood and Affect: Mood normal.        Behavior: Behavior normal.        Cognition and Memory: Memory is impaired.  Labs reviewed: Recent Labs    01/26/21 0000 01/29/21 0000  NA 139 145  K 4.7 4.5  CL 102 109*  CO2 31* 30*  BUN 24* 27*  CREATININE 1.2* 1.0  CALCIUM 8.8 8.9    Recent Labs    01/26/21 0000 01/29/21 0000  AST 13 13  ALT 12 17  ALKPHOS 55 79  ALBUMIN 3.1* 3.5   Recent Labs    01/26/21 0000 01/29/21 0000 03/16/21 1445  WBC 8.2 8.0 10.1  NEUTROABS 5,150.00 4,384.00 6,090.00  HGB 11.5* 10.6* 11.3*  HCT 34* 33* 36  PLT 360 229 242   Lab Results  Component Value Date   TSH 2.39 04/03/2020   Lab Results  Component Value Date   HGBA1C 6.0 01/29/2021   Lab Results  Component Value Date   CHOL 138 01/26/2021   HDL 43 01/26/2021   LDLCALC 78 01/26/2021   TRIG 83 01/26/2021   CHOLHDL 6.3 03/06/2019    Significant Diagnostic Results in last 30 days:  No results found.  Assessment/Plan 1. Primary hypertension - controlled - cont amlodipine 5 mg daily - cont low sodium diet  2. Vascular dementia without behavioral disturbance (HCC) - no recent behavioral outbursts - cont namenda 10 mg bid - cont donepezil 10 mg daily - cont skilled nursing care  3. Depression with anxiety - almost healed scratch marks on upper forearms, no skin picking - cont sertraline 50 mg daily  4. Cerebrovascular accident (CVA) due to thrombosis of basilar artery (HCC) - blood pressure controlled  - cont atorvastatin 40 mg daily - cont plavix 75 mg daily  5. Gait abnormality - completed PT/OT March 2022 - last fall 04/29- no injury - cont ambulation with wheelchair, mod assist with transfers/ADLs  6. Prediabetes - hgb 6.0 - bilateral foot exam 10/10 - cont low carb diet and limit sugar - urine microalbumin- future - diabetic eye exam - future  7. Anemia, chronic disease - Hgb 11.3  8. Gastroesophageal reflux disease, unspecified whether esophagitis present - stable with Protonix 40 mg daily  9. Other hyperlipidemia - LDL 78 01/26/2021 - cont atorvastatin 40 mg daily  10. Frequent falls - 04/29 fall without injury - cont falls safety precautions  11. Anxiety - no recent panic attacks - cont sertraline 50 mg daily  12.  COVID-19 - tested positive 05/09 - remained asymptomatic during course - completed vaccination series and booster    Family/ staff Communication: plan discussed with patient and nurse  Labs/tests ordered:  none

## 2021-06-05 ENCOUNTER — Encounter: Payer: Self-pay | Admitting: Orthopedic Surgery

## 2021-06-05 ENCOUNTER — Non-Acute Institutional Stay (SKILLED_NURSING_FACILITY): Payer: PPO | Admitting: Orthopedic Surgery

## 2021-06-05 DIAGNOSIS — I6302 Cerebral infarction due to thrombosis of basilar artery: Secondary | ICD-10-CM

## 2021-06-05 DIAGNOSIS — F418 Other specified anxiety disorders: Secondary | ICD-10-CM

## 2021-06-05 DIAGNOSIS — K219 Gastro-esophageal reflux disease without esophagitis: Secondary | ICD-10-CM

## 2021-06-05 DIAGNOSIS — R269 Unspecified abnormalities of gait and mobility: Secondary | ICD-10-CM | POA: Diagnosis not present

## 2021-06-05 DIAGNOSIS — D638 Anemia in other chronic diseases classified elsewhere: Secondary | ICD-10-CM

## 2021-06-05 DIAGNOSIS — I10 Essential (primary) hypertension: Secondary | ICD-10-CM

## 2021-06-05 DIAGNOSIS — F0150 Vascular dementia without behavioral disturbance: Secondary | ICD-10-CM | POA: Diagnosis not present

## 2021-06-05 DIAGNOSIS — R7303 Prediabetes: Secondary | ICD-10-CM

## 2021-06-05 NOTE — Progress Notes (Signed)
Location:  [facility] Room Number: 18 Place of Service:  SNF [phone](31) Provider:  Hazle NordmannAmy Klaira Pesci, AGNP-C  Mahlon GammonGupta, Anjali L, MD  Patient Care Team: Mahlon GammonGupta, Anjali L, MD as PCP - General (Internal Medicine) Ngetich, Donalee Citrininah C, NP as Nurse Practitioner [facility](Family Medicine)  Extended Emergency Contact Information Primary Emergency Contact: Gordan,[place] Address: [address]          [place], [place] [address] [place] Home Phone: [phone] Relation: Daughter Secondary Emergency Contact: Iven FinnGarrigan,Donald Address: [address]          [place], [place] [address] [place] Home Phone: [phone] Relation: Spouse  Code Status: DNR Goals of care: Advanced Directive information Advanced Directives 05/04/2021  Does Patient Have a Medical Advance Directive? Yes  Type of Estate agentAdvance Directive Healthcare Power of [place];Living will;Out of facility DNR (pink MOST or yellow form)  Does patient want to make changes to medical advance directive? No - Patient declined  Copy of Healthcare Power of Attorney in Chart? Yes - validated most recent copy scanned in chart (See row information)  Pre-existing out of facility DNR order (yellow form or pink MOST form) Yellow form placed in chart (order not valid for inpatient use)     Chief Complaint  Patient presents with   Medical Management of Chronic Issues    HPI:  Pt is a 87 y.o. female seen today for medical management of chronic diseases.    [facility] Past medical conditions include: hypertension, stroke, dementia, GERD, diabetes, CKD stage 3, hyperlipidemia, depression, anxiety and abnormal gait.  HTN- amlodipine 5 mg daily, BUN/creat 27/1.0 01/29/21 Anemia- hgb 11.3, ferritin 15, iron 30 03/16/2021 GERD- denies reflux, Protonix 40 mg daily Dementia- BIMS score = 15, correctly names year, place, and month today. No recent behavioral outbursts. Namenda  10 mg bid, Aricept 10 mg qhs.  Anxiety- no recent panic attacks, Zoloft 50 mg daily Hx of CVA- Lipitor 40 mg daily, Plavix 75 mg daily Osteoporosis- DEXA discontinued due to advanced age, no recent falls or injuries, calcium carbonate 600 mg daily, vitamin D 1000 units daily.  Prediabetes- Hgb 6.0 01/29/2021 Abnormal gait- no recent falls., ambulating with wheelchair  Recent blood pressures:  06/24- 110/70  06/23- 130/71  06/22- 130/75  Recent weights:  06/01- 143.4 lbs  05/02- 145.2 lbs  04/01- 144.5 lbs  Nurse does not report any concerns, vitals stable.      Past Medical History:  Diagnosis Date   Allergic rhinitis    Anxiety    Balance problem 04/28/2015   Fatigue 04/28/2015   Hearing loss 04/28/2015   history of Fracture of right humerus 04/28/2015   HLD (hyperlipidemia) 03/05/2019   Hyperglycemia    Hyperlipidemia    Hypertension    Memory loss 04/28/2015   04/26/2014 MMSE 28/30. Failed clock drawing. 04/21/15 MMSE 21/30. Failed clock drawing.    Osteoporosis    Past Surgical History:  Procedure Laterality Date   ABDOMINAL HYSTERECTOMY     CATARACT EXTRACTION  2010   ELBOW SURGERY Right 1988   FEMUR FRACTURE SURGERY Right 06/2014   [place] Dr. Dione HousekeeperJohn Vanderhoof   HUMERUS FRACTURE SURGERY Right 2015   [place] Dr. Eber HongBrian Miller   TONSILLECTOMY  [phone]    Allergies  Allergen Reactions   Ace Inhibitors Other (See Comments)    Unknown reaction   Almond Oil Rash   Plum Pulp Rash    Outpatient  Encounter Medications as of 06/05/2021  Medication Sig   amLODipine (NORVASC) 5 MG tablet Take 5 mg by mouth daily.    atorvastatin (LIPITOR) 40 MG tablet Take 1 tablet (40 mg total) by mouth daily at 6 PM.   calcium carbonate (OSCAL) 1500 (600 Ca) MG TABS tablet Take 600 mg of elemental calcium by mouth daily with breakfast.   Cholecalciferol (VITAMIN D3) 1000 UNITS CAPS Take 1,000 Units daily by mouth.    clopidogrel (PLAVIX) 75 MG tablet Take 1 tablet (75 mg total)  by mouth daily.   donepezil (ARICEPT) 10 MG tablet TAKE ONE TABLET BY MOUTH DAILY TO PRESERVE MEMORY   memantine (NAMENDA) 10 MG tablet Take 1 tablet (10 mg total) by mouth 2 (two) times daily.   Omega-3 Fatty Acids (FISH OIL) 500 MG CAPS Take 1 capsule by mouth daily.   pantoprazole (PROTONIX) 40 MG tablet Take 1 tablet (40 mg total) by mouth daily.   sertraline (ZOLOFT) 50 MG tablet Take 50 mg by mouth daily.   triamcinolone cream (KENALOG) 0.1 % Apply 1 application topically 2 (two) times daily as needed.   zinc oxide 20 % ointment Apply 1 application topically as needed for irritation. Apply to peri/buttocks after every incontinent episode   No facility-administered encounter medications on file as of 06/05/2021.    Review of Systems  Constitutional:  Negative for activity change, appetite change, fatigue and fever.  HENT:  Positive for hearing loss. Negative for congestion and trouble swallowing.   Eyes:  Negative for visual disturbance.  Respiratory:  Negative for cough, shortness of breath and wheezing.   Cardiovascular:  Negative for chest pain and leg swelling.  Gastrointestinal:  Negative for abdominal distention, abdominal pain, blood in stool, constipation, diarrhea and nausea.  Genitourinary:  Negative for dysuria, frequency and hematuria.  Musculoskeletal:  Positive for gait problem. Negative for arthralgias and myalgias.  Skin:        Dry, thin skin  Neurological:  Positive for weakness. Negative for dizziness and headaches.  Psychiatric/Behavioral:  Positive for confusion. Negative for dysphoric mood and sleep disturbance. The patient is nervous/anxious.    Immunization History  Administered Date(s) Administered   DTaP 05/12/2014   Influenza, High Dose Seasonal PF 09/21/2017, 09/27/2019, 09/16/2020   Influenza,inj,Quad PF,6+ Mos 09/14/2018   Influenza-Unspecified 10/07/2014, 09/11/2015, 09/23/2016   Moderna SARS-COV2 Booster Vaccination 05/20/2021   Moderna  Sars-Covid-2 Vaccination 12/17/2019, 01/14/2020, 10/27/2020   PPD Test 10/16/2014   Pneumococcal Conjugate-13 10/20/2017   Pneumococcal Polysaccharide-23 08/20/2011   Pertinent  Health Maintenance Due  Topic Date Due   OPHTHALMOLOGY EXAM  Never done   URINE MICROALBUMIN  Never done   DEXA SCAN  Never done   INFLUENZA VACCINE  07/13/2021   HEMOGLOBIN A1C  07/29/2021   FOOT EXAM  05/04/2022   PNA vac Low Risk Adult  Completed   Fall Risk  11/21/2018 10/14/2017 02/17/2016 01/06/2015  Falls in the past year? 0 No No Yes  Comment - - - fx (R) humerus; fx (R) femur  Number falls in past yr: 0 - - 2 or more  Injury with Fall? 0 - - -  Risk for fall due to : - - - History of fall(s)   Functional Status Survey:    Vitals:   06/05/21 1505  BP: 110/70  Pulse: 61  Resp: 20  Temp: 97.7 F (36.5 C)  SpO2: 98%  Weight: 143 lb 6.4 oz (65 kg)  Height: 5\' 2"  (1.575 m)   Body mass  index is 26.23 kg/m. Physical Exam Vitals reviewed.  Constitutional:      General: She is not in acute distress. HENT:     Head: Normocephalic.     Right Ear: There is no impacted cerumen.     Left Ear: There is no impacted cerumen.     Nose: Nose normal.     Mouth/Throat:     Mouth: Mucous membranes are moist.  Eyes:     General:        Right eye: No discharge.        Left eye: No discharge.  Neck:     Vascular: No carotid bruit.  Cardiovascular:     Rate and Rhythm: Normal rate and regular rhythm.     Pulses: Normal pulses.     Heart sounds: Normal heart sounds.  Pulmonary:     Effort: Pulmonary effort is normal. No respiratory distress.     Breath sounds: Normal breath sounds. No wheezing.  Abdominal:     General: Bowel sounds are normal. There is no distension.     Palpations: Abdomen is soft.     Tenderness: There is no abdominal tenderness.  Musculoskeletal:     Cervical back: Normal range of motion.     Right lower leg: No edema.     Left lower leg: No edema.  Lymphadenopathy:      Cervical: No cervical adenopathy.  Skin:    General: Skin is warm and dry.     Capillary Refill: Capillary refill takes less than 2 seconds.  Neurological:     General: No focal deficit present.     Mental Status: She is alert. Mental status is at baseline.     Motor: Weakness present.     Gait: Gait abnormal.     Comments: wheelchair  Psychiatric:        Mood and Affect: Mood normal.        Behavior: Behavior normal.        Cognition and Memory: Memory is impaired.    Labs reviewed: Recent Labs    01/26/21 0000 01/29/21 0000  NA 139 145  K 4.7 4.5  CL 102 109*  CO2 31* 30*  BUN 24* 27*  CREATININE 1.2* 1.0  CALCIUM 8.8 8.9   Recent Labs    01/26/21 0000 01/29/21 0000  AST 13 13  ALT 12 17  ALKPHOS 55 79  ALBUMIN 3.1* 3.5   Recent Labs    01/26/21 0000 01/29/21 0000 03/16/21 1445  WBC 8.2 8.0 10.1  NEUTROABS 5,150.00 4,384.00 6,090.00  HGB 11.5* 10.6* 11.3*  HCT 34* 33* 36  PLT 360 229 242   Lab Results  Component Value Date   TSH 2.39 04/03/2020   Lab Results  Component Value Date   HGBA1C 6.0 01/29/2021   Lab Results  Component Value Date   CHOL 138 01/26/2021   HDL 43 01/26/2021   LDLCALC 78 01/26/2021   TRIG 83 01/26/2021   CHOLHDL 6.3 03/06/2019    Significant Diagnostic Results in last 30 days:  No results found.  Assessment/Plan 1. Primary hypertension - controlled - cont amlodipine 5 mg daily - cbc/diff- future - cmp- future  2. Vascular dementia without behavioral disturbance (HCC) - no behavioral outbursts - BIMS score 15 03/12/2021 - cont aricept and namneda  3. Depression with anxiety - no recent panic attacks - cont zoloft  4. Cerebrovascular accident (CVA) due to thrombosis of basilar artery (HCC) - bp controlled - cont statin and plavix  5. Gait abnormality - ambulates with wheelchair, no recent falls  6. Prediabetes - hgb 6.0 01/29/2021 - cont low carb diet and limit sugar - a1C- future  7. Anemia,  chronic disease - hgb 11.3 03/16/2021  8. Gastroesophageal reflux disease, unspecified whether esophagitis present - stable with protonix    Family/ staff Communication: plan discussed with patient and nurse  Labs/tests ordered:  none

## 2021-06-12 DIAGNOSIS — M810 Age-related osteoporosis without current pathological fracture: Secondary | ICD-10-CM | POA: Diagnosis not present

## 2021-06-12 DIAGNOSIS — R278 Other lack of coordination: Secondary | ICD-10-CM | POA: Diagnosis not present

## 2021-06-12 DIAGNOSIS — I639 Cerebral infarction, unspecified: Secondary | ICD-10-CM | POA: Diagnosis not present

## 2021-06-12 DIAGNOSIS — R2689 Other abnormalities of gait and mobility: Secondary | ICD-10-CM | POA: Diagnosis not present

## 2021-06-12 DIAGNOSIS — F0390 Unspecified dementia without behavioral disturbance: Secondary | ICD-10-CM | POA: Diagnosis not present

## 2021-06-12 DIAGNOSIS — M6281 Muscle weakness (generalized): Secondary | ICD-10-CM | POA: Diagnosis not present

## 2021-06-12 DIAGNOSIS — R2681 Unsteadiness on feet: Secondary | ICD-10-CM | POA: Diagnosis not present

## 2021-06-29 ENCOUNTER — Encounter: Payer: Self-pay | Admitting: Orthopedic Surgery

## 2021-06-29 ENCOUNTER — Non-Acute Institutional Stay (SKILLED_NURSING_FACILITY): Payer: PPO | Admitting: Orthopedic Surgery

## 2021-06-29 DIAGNOSIS — E7849 Other hyperlipidemia: Secondary | ICD-10-CM | POA: Diagnosis not present

## 2021-06-29 DIAGNOSIS — R7303 Prediabetes: Secondary | ICD-10-CM | POA: Diagnosis not present

## 2021-06-29 DIAGNOSIS — I6302 Cerebral infarction due to thrombosis of basilar artery: Secondary | ICD-10-CM

## 2021-06-29 DIAGNOSIS — K219 Gastro-esophageal reflux disease without esophagitis: Secondary | ICD-10-CM

## 2021-06-29 DIAGNOSIS — F0150 Vascular dementia without behavioral disturbance: Secondary | ICD-10-CM

## 2021-06-29 DIAGNOSIS — I10 Essential (primary) hypertension: Secondary | ICD-10-CM

## 2021-06-29 DIAGNOSIS — F418 Other specified anxiety disorders: Secondary | ICD-10-CM | POA: Diagnosis not present

## 2021-06-29 DIAGNOSIS — R269 Unspecified abnormalities of gait and mobility: Secondary | ICD-10-CM

## 2021-06-29 NOTE — Progress Notes (Signed)
Location:   [facility] Room Number: N18 Place of Service:  SNF (31) Provider:  Hazle NordmannAmy Lundon Verdejo, NP    Patient Care Team: Mahlon GammonGupta, Anjali L, MD as PCP - General (Internal Medicine) Ngetich, Donalee Citrininah C, NP as Nurse Practitioner (Family Medicine)  Extended Emergency Contact Information Primary Emergency Contact: Gordan,[place] Address: [address]          [place], [place] [address] [place] Home Phone: [phone] Relation: Daughter Secondary Emergency Contact: Zilberman,Donald Address: [address]          [place], [place] [address] [place] Home Phone: [phone] Relation: Spouse  Code Status:  DNR Goals of care: Advanced Directive information Advanced Directives 06/29/2021  Does Patient Have a Medical Advance Directive? Yes  Type of Estate agentAdvance Directive Healthcare Power of [place];Living will;Out of facility DNR (pink MOST or yellow form)  Does patient want to make changes to medical advance directive? No - Patient declined  Copy of Healthcare Power of Attorney in Chart? Yes - validated most recent copy scanned in chart (See row information)  Pre-existing out of facility DNR order (yellow form or pink MOST form) Yellow form placed in chart (order not valid for inpatient use)     Chief Complaint  Patient presents with   Medical Management of Chronic Issues    Routine follow up   Health Maintenance    Discuss need for ophthalmology exam and shingles vaccine.    HPI:  Pt is a 87 y.o. female seen today for medical management of chronic diseases.    [facility] Past medical conditions include: hypertension, stroke, dementia, GERD, diabetes, CKD stage 3, hyperlipidemia, depression, anxiety and abnormal gait.  Very pleasant today. Follows commands. Healthy appetite with lunch plate empty.   Dementia, BIMS score 15 06/11/2021, she states " my memory is shot", no recent behavioral  outbursts, does not leave room often, upset husband visits only twice a week- he checks on her daily, remains on Aricept and Namenda.  HTN, BUN/creat 27/1.0 01/29/2021, amlodipine 5 mg daily HLD, LDL 78 01/26/2021, Lipitor 40 mg daily Anxiety, zoloft 50 mg daily, no recent panic attacks GERD, protonix 40 mg daily, hgb 11.3 03/16/2021 Osteoporosis, no DEXA due to advanced age and limited mobility, remains on calcium/vit d supplement Prediabetes, A1c 6.0 01/29/2021  No recent falls, injuries or behavioral outbursts.   Recent blood pressures:  07/18- 133/74  07/17- 135/72  07/16- 128/78  Recent weights:  07/01- 144.9 lbs  06/01- 143.4 lbs  05/02- 145.2 lbs  Nurse does not report any concerns, vitals stable.          Past Medical History:  Diagnosis Date   Allergic rhinitis    Anxiety    Balance problem 04/28/2015   Fatigue 04/28/2015   Hearing loss 04/28/2015   history of Fracture of right humerus 04/28/2015   HLD (hyperlipidemia) 03/05/2019   Hyperglycemia    Hyperlipidemia    Hypertension    Memory loss 04/28/2015   04/26/2014 MMSE 28/30. Failed clock drawing. 04/21/15 MMSE 21/30. Failed clock drawing.    Osteoporosis    Past Surgical History:  Procedure Laterality Date   ABDOMINAL HYSTERECTOMY     CATARACT EXTRACTION  2010   ELBOW SURGERY Right 1988   FEMUR FRACTURE SURGERY Right 06/2014   [place] Dr. Dione HousekeeperJohn Vanderhoof   HUMERUS FRACTURE SURGERY Right 2015   [place] Dr. Eber HongBrian Miller   TONSILLECTOMY  1935    Allergies  Allergen Reactions   Ace Inhibitors Other (See Comments)    Unknown reaction   Almond Oil Rash   Plum Pulp Rash    Allergies as of 06/29/2021       Reactions   Ace Inhibitors Other (See Comments)   Unknown reaction   Almond Oil Rash   Plum Pulp Rash        Medication List        Accurate as of June 29, 2021  2:40 PM. If you have any questions, ask your nurse or doctor.          amLODipine 5 MG tablet Commonly known  as: NORVASC Take 5 mg by mouth daily.   atorvastatin 40 MG tablet Commonly known as: LIPITOR Take 1 tablet (40 mg total) by mouth daily at 6 PM.   calcium carbonate 1500 (600 Ca) MG Tabs tablet Commonly known as: OSCAL Take 600 mg of elemental calcium by mouth daily with breakfast.   clopidogrel 75 MG tablet Commonly known as: PLAVIX Take 1 tablet (75 mg total) by mouth daily.   donepezil 10 MG tablet Commonly known as: ARICEPT TAKE ONE TABLET BY MOUTH DAILY TO PRESERVE MEMORY   Fish Oil 500 MG Caps Take 1 capsule by mouth daily.   memantine 10 MG tablet Commonly known as: Namenda Take 1 tablet (10 mg total) by mouth 2 (two) times daily.   pantoprazole 40 MG tablet Commonly known as: Protonix Take 1 tablet (40 mg total) by mouth daily.   sertraline 50 MG tablet Commonly known as: ZOLOFT Take 50 mg by mouth daily.   triamcinolone cream 0.1 % Commonly known as: KENALOG Apply 1 application topically 2 (two) times daily as needed.   Vitamin D3 25 MCG (1000 UT) Caps Take 1,000 Units daily by mouth.   zinc oxide 20 % ointment Apply 1 application topically as needed for irritation. Apply to peri/buttocks after every incontinent episode        Review of Systems  Unable to perform ROS: Dementia   Immunization History  Administered Date(s) Administered   DTaP 05/12/2014   Influenza, High Dose Seasonal PF 09/21/2017, 09/27/2019, 09/16/2020   Influenza,inj,Quad PF,6+ Mos 09/14/2018   Influenza-Unspecified 10/07/2014, 09/11/2015, 09/23/2016   Moderna SARS-COV2 Booster Vaccination 05/20/2021   Moderna Sars-Covid-2 Vaccination 12/17/2019, 01/14/2020, 10/27/2020   PPD Test 10/16/2014   Pneumococcal Conjugate-13 10/20/2017   Pneumococcal Polysaccharide-23 08/20/2011   Pertinent  Health Maintenance Due  Topic Date Due   OPHTHALMOLOGY EXAM  Never done   URINE MICROALBUMIN  07/08/2021 (Originally 03/21/1936)   DEXA SCAN  06/08/2022 (Originally 03/21/1991)   INFLUENZA  VACCINE  07/13/2021   HEMOGLOBIN A1C  07/29/2021   FOOT EXAM  05/04/2022   PNA vac Low Risk Adult  Completed   Fall Risk  11/21/2018 10/14/2017 02/17/2016 01/06/2015  Falls in the past year? 0 No No Yes  Comment - - - fx (R) humerus; fx (R) femur  Number falls in past yr: 0 - - 2 or more  Injury with Fall? 0 - - -  Risk for fall due to : - - - History of fall(s)   Functional Status Survey:    Vitals:   06/29/21 1436  BP: 133/74  Pulse: 64  Resp: 20  Temp: (!) 97.5 F (36.4 C)  SpO2: 95%  Weight: 144 lb 14.4 oz (65.7 kg)  Height: 5\' 2"  (1.575 m)   Body mass index is 26.5 kg/m. Physical Exam Vitals reviewed.  Constitutional:      General: She is not in acute distress. HENT:     Head: Normocephalic.     Right Ear: There is no impacted cerumen.     Left Ear: There is no impacted cerumen.     Nose: Nose normal.     Mouth/Throat:     Mouth: Mucous membranes are moist.  Eyes:     General:        Right eye: No discharge.        Left eye: No discharge.  Neck:     Vascular: No carotid bruit.  Cardiovascular:     Rate and Rhythm: Normal rate and regular rhythm.     Pulses: Normal pulses.     Heart sounds: Normal heart sounds. No murmur heard. Pulmonary:     Effort: Pulmonary effort is normal. No respiratory distress.     Breath sounds: Normal breath sounds. No wheezing.  Abdominal:     General: Bowel sounds are normal. There is no distension.     Palpations: Abdomen is soft.     Tenderness: There is no abdominal tenderness.  Musculoskeletal:     Cervical back: Normal range of motion.     Right lower leg: No edema.     Left lower leg: No edema.  Lymphadenopathy:     Cervical: No cervical adenopathy.  Skin:    General: Skin is dry.     Capillary Refill: Capillary refill takes less than 2 seconds.  Neurological:     General: No focal deficit present.     Mental Status: She is alert. Mental status is at baseline.     Motor: Weakness present.     Gait: Gait abnormal.      Comments: wheelchair  Psychiatric:        Mood and Affect: Mood normal.        Behavior: Behavior normal.        Cognition and Memory: Memory is impaired.    Labs reviewed: Recent Labs    01/26/21 0000 01/29/21 0000  NA 139 145  K 4.7 4.5  CL 102 109*  CO2 31* 30*  BUN 24* 27*  CREATININE 1.2* 1.0  CALCIUM 8.8 8.9   Recent Labs    01/26/21 0000 01/29/21 0000  AST 13 13  ALT 12 17  ALKPHOS 55 79  ALBUMIN 3.1* 3.5   Recent Labs    01/26/21 0000 01/29/21 0000 03/16/21 1445  WBC 8.2 8.0 10.1  NEUTROABS 5,150.00 4,384.00 6,090.00  HGB 11.5* 10.6* 11.3*  HCT 34* 33* 36  PLT 360 229 242   Lab Results  Component Value Date   TSH 2.39 04/03/2020   Lab Results  Component Value Date   HGBA1C 6.0 01/29/2021   Lab Results  Component Value Date   CHOL 138 01/26/2021   HDL 43 01/26/2021   LDLCALC 78 01/26/2021   TRIG 83 01/26/2021   CHOLHDL 6.3 03/06/2019    Significant Diagnostic Results in last 30 days:  No results found.  Assessment/Plan 1. Vascular dementia without behavioral disturbance (HCC) - no recent behavioral outbursts - cont Aricept and Namenda  - cont skilled nursing care  2. Primary hypertension  - controlled  - cont amlodipine  3. Other hyperlipidemia - LDL 78 01/26/2021 - cont statin  4. Depression with anxiety - stable with Zoloft  5. Gastroesophageal reflux disease, unspecified whether esophagitis present - cont Protonix   6. Cerebrovascular accident (CVA) due to thrombosis of basilar artery (HCC) - cont  plavix and statin  7. Gait abnormality - cont skilled nursing care  8. Prediabetes - a1c 6.0 01/29/2021 - cont to limit carbs and sugars in diet   Family/ staff Communication: plan discussed with nurse   Labs/tests ordered:  none

## 2021-07-01 ENCOUNTER — Non-Acute Institutional Stay (INDEPENDENT_AMBULATORY_CARE_PROVIDER_SITE_OTHER): Payer: PPO | Admitting: Orthopedic Surgery

## 2021-07-01 ENCOUNTER — Encounter: Payer: Self-pay | Admitting: Orthopedic Surgery

## 2021-07-01 DIAGNOSIS — Z0000 Encounter for general adult medical examination without abnormal findings: Secondary | ICD-10-CM

## 2021-07-01 NOTE — Progress Notes (Signed)
Provider:  Dionicia Abler Location:  [facility] [address]   Place of Service:      PCP: [name], MD Patient Care Team: [name], MD as PCP - General (Internal Medicine) Ngetich, Donalee Citrin, NP as Nurse Practitioner [facility])  Extended Emergency Contact Information Primary Emergency Contact: Gordan,[place] Address: [address]          [place], [place] [address] [place] of [place] Home Phone: [phone] Relation: Daughter Secondary Emergency Contact: Iven Finn Address: [address]          [place], [place] [address] [place] of [place] Home Phone: [phone] Relation: Spouse  Code Status: DNR Goals of Care: Advanced Directive information Advanced Directives 07/01/2021  Does Patient Have a Medical Advance Directive? Yes  Type of Estate agent of [place];Living will  Does patient want to make changes to medical advance directive? No - Patient declined  Copy of Healthcare Power of Attorney in Chart? Yes - validated most recent copy scanned in chart (See row information)  Pre-existing out of facility DNR order (yellow form or pink MOST form) -     Chief Complaint  Patient presents with   Annual Exam    Annual wellness exam    HPI: Patient is a 87 y.o. female seen today for an annual comprehensive examination.  Past Medical History:  Diagnosis Date   Allergic rhinitis    Anxiety    Balance problem 04/28/2015   Fatigue 04/28/2015   Hearing loss 04/28/2015   history of Fracture of right humerus 04/28/2015   HLD (hyperlipidemia) 03/05/2019   Hyperglycemia    Hyperlipidemia    Hypertension    Memory loss 04/28/2015   04/26/2014 MMSE 28/30. Failed clock drawing. 04/21/15 MMSE 21/30. Failed clock drawing.    Osteoporosis    Past Surgical History:  Procedure Laterality Date   ABDOMINAL HYSTERECTOMY     CATARACT EXTRACTION  2010   ELBOW SURGERY Right 1988   FEMUR FRACTURE SURGERY Right 06/2014   [place] Dr. Dione Housekeeper   HUMERUS FRACTURE SURGERY Right 2015   [place], [place] Dr. Eber Hong   TONSILLECTOMY  [phone]    reports that she has never smoked. She has never used smokeless tobacco. She reports current alcohol use of about 1.0 - 2.0 standard drink of alcohol per week. She reports that she does not use drugs. Social History   Socioeconomic History   Marital status: Unknown    Spouse name: Not on file   Number of children: Not on file   Years of education: Not on file   Highest education level: Not on file  Occupational History   Occupation: Housewife  Tobacco Use   Smoking status: Never   Smokeless tobacco: Never  Vaping Use   Vaping Use: Never used  Substance and Sexual Activity   Alcohol use: Yes    Alcohol/week: 1.0 - 2.0 standard drink    Types: 1 - 2 Glasses of wine per week    Comment: 1-2 glasses 1-2 times a week   Drug use: No   Sexual activity: Not on file  Other Topics Concern   Not on file  Social History Narrative   Lives at [facility] since 11/28/14   Married [name]   Never smoked   Alcohol -wine 1-2 daily   Caffeine coffee 2 daily   Exercise walking   Walks with walker   Living Will, POA, DNR   Social Determinants of Corporate investment banker  Strain: Not on file  Food Insecurity: Not on file  Transportation Needs: Not on file  Physical Activity: Not on file  Stress: Not on file  Social Connections: Not on file  Intimate Partner Violence: Not on file   Family History  Problem Relation Age of Onset   Heart disease Mother    Diabetes Daughter     Pertinent  Health Maintenance Due  Topic Date Due   OPHTHALMOLOGY EXAM  Never done   URINE MICROALBUMIN  07/08/2021 (Originally 03/21/1936)   DEXA SCAN  06/08/2022 (Originally 03/21/1991)   INFLUENZA VACCINE  07/13/2021   HEMOGLOBIN A1C  07/29/2021   FOOT EXAM  05/04/2022   PNA vac Low Risk Adult  Completed   Fall Risk  11/21/2018 10/14/2017 02/17/2016 01/06/2015  Falls in the past year? 0 No No Yes   Comment - - - fx (R) humerus; fx (R) femur  Number falls in past yr: 0 - - 2 or more  Injury with Fall? 0 - - -  Risk for fall due to : - - - History of fall(s)   Depression screen [facility] 2/9 04/17/2019 07/19/2018 10/14/2017 08/24/2016 01/06/2015  Decreased Interest 0 0 0 0 0  Down, Depressed, Hopeless 0 0 0 0 1  PHQ - 2 Score 0 0 0 0 1  Altered sleeping - 0 - - -  Tired, decreased energy - 0 - - -  Change in appetite - 0 - - -  Feeling bad or failure about yourself  - 0 - - -  Trouble concentrating - 0 - - -  Moving slowly or fidgety/restless - 0 - - -  Suicidal thoughts - 0 - - -  PHQ-9 Score - 0 - - -    Functional Status Survey:    Allergies  Allergen Reactions   Ace Inhibitors Other (See Comments)    Unknown reaction   Almond Oil Rash   Plum Pulp Rash    Allergies as of 07/01/2021       Reactions   Ace Inhibitors Other (See Comments)   Unknown reaction   Almond Oil Rash   Plum Pulp Rash        Medication List        Accurate as of July 01, 2021  3:52 PM. If you have any questions, ask your nurse or doctor.          amLODipine 5 MG tablet Commonly known as: NORVASC Take 5 mg by mouth daily.   atorvastatin 40 MG tablet Commonly known as: LIPITOR Take 1 tablet (40 mg total) by mouth daily at 6 PM.   calcium carbonate 1500 (600 Ca) MG Tabs tablet Commonly known as: OSCAL Take 600 mg of elemental calcium by mouth daily with breakfast.   clopidogrel 75 MG tablet Commonly known as: PLAVIX Take 1 tablet (75 mg total) by mouth daily.   donepezil 10 MG tablet Commonly known as: ARICEPT TAKE ONE TABLET BY MOUTH DAILY TO PRESERVE MEMORY   Fish Oil 500 MG Caps Take 1 capsule by mouth daily.   memantine 10 MG tablet Commonly known as: Namenda Take 1 tablet (10 mg total) by mouth 2 (two) times daily.   pantoprazole 40 MG tablet Commonly known as: Protonix Take 1 tablet (40 mg total) by mouth daily.   sertraline 50 MG tablet Commonly known as:  ZOLOFT Take 50 mg by mouth daily.   triamcinolone cream 0.1 % Commonly known as: KENALOG Apply 1 application topically 2 (two) times daily  as needed.   Vitamin D3 25 MCG (1000 UT) Caps Take 1,000 Units daily by mouth.   zinc oxide 20 % ointment Apply 1 application topically as needed for irritation. Apply to peri/buttocks after every incontinent episode        Review of Systems  Vitals:   07/01/21 1550  BP: 124/73  Pulse: 64  Resp: 18  Temp: (!) 97.4 F (36.3 C)  SpO2: 96%  Weight: 144 lb 14.4 oz (65.7 kg)  Height: 5\' 2"  (1.575 m)   Body mass index is 26.5 kg/m. Physical Exam  Labs reviewed: Basic Metabolic Panel: Recent Labs    01/26/21 0000 01/29/21 0000  NA 139 145  K 4.7 4.5  CL 102 109*  CO2 31* 30*  BUN 24* 27*  CREATININE 1.2* 1.0  CALCIUM 8.8 8.9   Liver Function Tests: Recent Labs    01/26/21 0000 01/29/21 0000  AST 13 13  ALT 12 17  ALKPHOS 55 79  ALBUMIN 3.1* 3.5   No results for input(s): LIPASE, AMYLASE in the last 8760 hours. No results for input(s): AMMONIA in the last 8760 hours. CBC: Recent Labs    01/26/21 0000 01/29/21 0000 03/16/21 1445  WBC 8.2 8.0 10.1  NEUTROABS 5,150.00 4,384.00 6,090.00  HGB 11.5* 10.6* 11.3*  HCT 34* 33* 36  PLT 360 229 242   Cardiac Enzymes: No results for input(s): CKTOTAL, CKMB, CKMBINDEX, TROPONINI in the last 8760 hours. BNP: Invalid input(s): POCBNP Lab Results  Component Value Date   HGBA1C 6.0 01/29/2021   Lab Results  Component Value Date   TSH 2.39 04/03/2020   Lab Results  Component Value Date   VITAMINB12 313 03/16/2021   No results found for: FOLATE Lab Results  Component Value Date   IRON 50 03/16/2021   FERRITIN 15 03/16/2021    Imaging and Procedures obtained recently: No results found.  Assessment/Plan There are no diagnoses linked to this encounter.   Family/ staff Communication:   Labs/tests ordered:

## 2021-07-01 NOTE — Patient Instructions (Signed)
  Ms. Medine , Thank you for taking time to come for your Medicare Wellness Visit. I appreciate your ongoing commitment to your health goals. Please review the following plan we discussed and let me know if I can assist you in the future.   These are the goals we discussed:  Goals      DIET - INCREASE WATER INTAKE     Maintain Ilfestlye     Starting today pt will maintain lifestyle.          This is a list of the screening recommended for you and due dates:  Health Maintenance  Topic Date Due   Eye exam for diabetics  Never done   Zoster (Shingles) Vaccine (1 of 2) Never done   Urine Protein Check  07/08/2021*   DEXA scan (bone density measurement)  06/08/2022*   Flu Shot  07/13/2021   Hemoglobin A1C  07/29/2021   Complete foot exam   05/04/2022   Tetanus Vaccine  12/14/2023   COVID-19 Vaccine  Completed   Pneumonia vaccines  Completed   HPV Vaccine  Aged Out  *Topic was postponed. The date shown is not the original due date.

## 2021-07-01 NOTE — Progress Notes (Signed)
Subjective:   Ana [name] is a 87 y.o. female who presents for Medicare Annual (Subsequent) preventive examination.  Review of Systems     Cardiac Risk Factors include: advanced age (>7555men, 40>65 women);diabetes mellitus;hypertension;sedentary lifestyle     Objective:    Today's Vitals   07/01/21 1550  BP: 124/73  Pulse: 64  Resp: 18  Temp: (!) 97.4 F (36.3 C)  SpO2: 96%  Weight: 144 lb 14.4 oz (65.7 kg)  Height: 5\' 2"  (1.575 m)   Body mass index is 26.5 kg/m.  Advanced Directives 07/01/2021 06/29/2021 05/04/2021 12/19/2020 10/17/2020 09/12/2020 07/31/2020  Does Patient Have a Medical Advance Directive? Yes Yes Yes Yes Yes Yes Yes  Type of Estate agentAdvance Directive Healthcare Power of [place];Living will Healthcare Power of [place];Living will;Out of facility DNR (pink MOST or yellow form) Healthcare Power of [place];Living will;Out of facility DNR (pink MOST or yellow form) Healthcare Power of [place];Out of facility DNR (pink MOST or yellow form) Out of facility DNR (pink MOST or yellow form);Healthcare Power of [place] Energy[place]orney Out of facility DNR (pink MOST or yellow form);Healthcare Power of [place] Energy[place]orney Out of facility DNR (pink MOST or yellow form)  Does patient want to make changes to medical advance directive? No - Patient declined No - Patient declined No - Patient declined No - Patient declined No - Patient declined No - Patient declined No - Patient declined  Copy of Healthcare Power of Attorney in Chart? Yes - validated most recent copy scanned in chart (See row information) Yes - validated most recent copy scanned in chart (See row information) Yes - validated most recent copy scanned in chart (See row information) Yes - validated most recent copy scanned in chart (See row information) Yes - validated most recent copy scanned in chart (See row information) Yes - validated most recent copy scanned in chart (See row information) -  Pre-existing out of facility DNR order (yellow form or  pink MOST form) - Yellow form placed in chart (order not valid for inpatient use) Yellow form placed in chart (order not valid for inpatient use) Yellow form placed in chart (order not valid for inpatient use) Yellow form placed in chart (order not valid for inpatient use) Yellow form placed in chart (order not valid for inpatient use) Yellow form placed in chart (order not valid for inpatient use)    Current Medications (verified) Outpatient Encounter Medications as of 07/01/2021  Medication Sig   amLODipine (NORVASC) 5 MG tablet Take 5 mg by mouth daily.    atorvastatin (LIPITOR) 40 MG tablet Take 1 tablet (40 mg total) by mouth daily at 6 PM.   calcium carbonate (OSCAL) 1500 (600 Ca) MG TABS tablet Take 600 mg of elemental calcium by mouth daily with breakfast.   Cholecalciferol (VITAMIN D3) 1000 UNITS CAPS Take 1,000 Units daily by mouth.    clopidogrel (PLAVIX) 75 MG tablet Take 1 tablet (75 mg total) by mouth daily.   donepezil (ARICEPT) 10 MG tablet TAKE ONE TABLET BY MOUTH DAILY TO PRESERVE MEMORY   memantine (NAMENDA) 10 MG tablet Take 1 tablet (10 mg total) by mouth 2 (two) times daily.   Omega-3 Fatty Acids (FISH OIL) 500 MG CAPS Take 1 capsule by mouth daily.   pantoprazole (PROTONIX) 40 MG tablet Take 1 tablet (40 mg total) by mouth daily.   sertraline (ZOLOFT) 50 MG tablet Take 50 mg by mouth daily.   triamcinolone cream (KENALOG) 0.1 % Apply 1 application topically 2 (two) times daily as needed.  zinc oxide 20 % ointment Apply 1 application topically as needed for irritation. Apply to peri/buttocks after every incontinent episode   No facility-administered encounter medications on file as of 07/01/2021.    Allergies (verified) Ace inhibitors, Almond oil, and Plum pulp   History: Past Medical History:  Diagnosis Date   Allergic rhinitis    Anxiety    Balance problem 04/28/2015   Fatigue 04/28/2015   Hearing loss 04/28/2015   history of Fracture of right humerus 04/28/2015    HLD (hyperlipidemia) 03/05/2019   Hyperglycemia    Hyperlipidemia    Hypertension    Memory loss 04/28/2015   04/26/2014 MMSE 28/30. Failed clock drawing. 04/21/15 MMSE 21/30. Failed clock drawing.    Osteoporosis    Past Surgical History:  Procedure Laterality Date   ABDOMINAL HYSTERECTOMY     CATARACT EXTRACTION  2010   ELBOW SURGERY Right 1988   FEMUR FRACTURE SURGERY Right 06/2014   [place] Dr. Dione Housekeeper   HUMERUS FRACTURE SURGERY Right 2015   [place], [place] Dr. Eber Hong   TONSILLECTOMY  [phone]   Family History  Problem Relation Age of Onset   Heart disease Mother    Diabetes Daughter    Social History   Socioeconomic History   Marital status: Unknown    Spouse name: Not on file   Number of children: Not on file   Years of education: Not on file   Highest education level: Not on file  Occupational History   Occupation: Housewife  Tobacco Use   Smoking status: Never   Smokeless tobacco: Never  Vaping Use   Vaping Use: Never used  Substance and Sexual Activity   Alcohol use: Yes    Alcohol/week: 1.0 - 2.0 standard drink    Types: 1 - 2 Glasses of wine per week    Comment: 1-2 glasses 1-2 times a week   Drug use: No   Sexual activity: Not on file  Other Topics Concern   Not on file  Social History Narrative   Lives at [facility] since 11/28/14   Married Ana [name]   Never smoked   Alcohol -wine 1-2 daily   Caffeine coffee 2 daily   Exercise walking   Walks with walker   Living Will, POA, DNR   Social Determinants of Health   Financial Resource Strain: Low Risk    Difficulty of Paying Living Expenses: Not hard at all  Food Insecurity: No Food Insecurity   Worried About Programme researcher, broadcasting/film/video in the Last Year: Never true   Ran Out of Food in the Last Year: Never true  Transportation Needs: No Transportation Needs   Lack of Transportation (Medical): No   Lack of Transportation (Non-Medical): No  Physical Activity: Insufficiently Active   Days  of Exercise per Week: 2 days   Minutes of Exercise per Session: 20 min  Stress: No Stress Concern Present   Feeling of Stress : Not at all  Social Connections: Moderately Isolated   Frequency of Communication with Friends and Family: Three times a week   Frequency of Social Gatherings with Friends and Family: Twice a week   Attends Religious Services: Never   Database administrator or Organizations: No   Attends Engineer, structural: Never   Marital Status: Married    Tobacco Counseling Counseling given: Not Answered   Clinical Intake:  Pre-visit preparation completed: Yes  Pain : No/denies pain BMI - recorded: 26.5 Nutritional Status: BMI 25 -29 Overweight Nutritional  Risks: None Diabetes: Yes CBG done?: No Did pt. bring in CBG monitor from home?: No  How often do you need to have someone help you when you read instructions, pamphlets, or other written materials from your doctor or pharmacy?: 5 - Always What is the last grade level you completed in school?: Cannot recall due to vascular dementia  Diabetic?Yes  Interpreter Needed?: No      Activities of Daily Living In your present state of health, do you have any difficulty performing the following activities: 07/01/2021  Hearing? N  Vision? N  Difficulty concentrating or making decisions? Y  Walking or climbing stairs? Y  Dressing or bathing? Y  Doing errands, shopping? Y  Preparing Food and eating ? Y  Using the Toilet? Y  In the past six months, have you accidently leaked urine? Y  Do you have problems with loss of bowel control? Y  Managing your Medications? Y  Managing your Finances? Y  Housekeeping or managing your Housekeeping? Y  Some recent data might be hidden    Patient Care Team: [name], MD as PCP - General (Internal Medicine) Ngetich, Donalee Citrin, NP as Nurse Practitioner (Family Medicine)  Indicate any recent Medical Services you may have received from other than [facility] providers  in the past year (date may be approximate).     Assessment:   This is a routine wellness examination for Ana [name].  Hearing/Vision screen No results found.  Dietary issues and exercise activities discussed: Current Exercise Habits: The patient does not participate in regular exercise at present, Type of exercise: strength training/weights, Time (Minutes): 20, Frequency (Times/Week): 2, Weekly Exercise (Minutes/Week): 40, Intensity: Mild, Exercise limited by: neurologic condition(s) (vascular dementia)   Goals Addressed             This Visit's Progress    DIET - INCREASE WATER INTAKE   Not on track      Depression Screen PHQ 2/9 Scores 07/01/2021 04/17/2019 07/19/2018 10/14/2017 08/24/2016 01/06/2015  PHQ - 2 Score 0 0 0 0 0 1  PHQ- 9 Score - - 0 - - -    Fall Risk Fall Risk  07/01/2021 11/21/2018 10/14/2017 02/17/2016 01/06/2015  Falls in the past year? 1 0 No No Yes  Comment - - - - fx (R) humerus; fx (R) femur  Number falls in past yr: 0 0 - - 2 or more  Injury with Fall? 0 0 - - -  Risk for fall due to : History of fall(s);Impaired balance/gait;Impaired mobility - - - History of fall(s)  Follow up Falls evaluation completed;Education provided;Falls prevention discussed - - - -    FALL RISK PREVENTION PERTAINING TO THE HOME:  Any stairs in or around the home? No  If so, are there any without handrails? No  Home free of loose throw rugs in walkways, pet beds, electrical cords, etc? Yes  Adequate lighting in your home to reduce risk of falls? Yes   ASSISTIVE DEVICES UTILIZED TO PREVENT FALLS:  Life alert? No  Use of a cane, walker or w/c? Yes  Grab bars in the bathroom? Yes  Shower chair or bench in shower? Yes  Elevated toilet seat or a handicapped toilet? Yes   TIMED UP AND GO:  Was the test performed? No .  Length of time to ambulate 10 feet: 0 sec.   Gait unsteady without use of assistive device, provider informed and interventions were implemented  Cognitive  Function: MMSE - Mini Mental State Exam 07/01/2021  08/07/2019 07/19/2018 02/15/2018 09/07/2017  Not completed: Unable to complete - (No Data) (No Data) -  Orientation to time - Orientation to Place - Registration - Attention/ Calculation - 0 Attention/Calculation-comments - refused - - -  Recall - 1 2 0 2  Language- name 2 objects - Language- repeat - Language- follow 3 step command - Language- read & follow direction - Write a sentence - 0 Copy design - 0 1 0 1  Total score - 6CIT Screen 07/01/2021  What Year? 0 points  What month? 0 points  What time? 3 points  Count back from 20 2 points  Months in reverse 2 points  Repeat phrase 4 points  Total Score 11    Immunizations Immunization History  Administered Date(s) Administered   DTaP 05/12/2014   Influenza, High Dose Seasonal PF 09/21/2017, 09/27/2019, 09/16/2020   Influenza,inj,Quad PF,6+ Mos 09/14/2018   Influenza-Unspecified 10/07/2014, 09/11/2015, 09/23/2016   Moderna SARS-COV2 Booster Vaccination 05/20/2021   Moderna Sars-Covid-2 Vaccination 12/17/2019, 01/14/2020, 10/27/2020   PPD Test 10/16/2014   Pneumococcal Conjugate-13 10/20/2017   Pneumococcal Polysaccharide-23 08/20/2011    TDAP status: Up to date  Flu Vaccine status: Up to date  Pneumococcal vaccine status: Up to date  Covid-19 vaccine status: Completed vaccines  Qualifies for Shingles Vaccine? Yes   Zostavax completed No   Shingrix Completed?: No.    Education has been provided regarding the importance of this vaccine. Patient has been advised to call insurance company to determine out of pocket expense if they have not yet received this vaccine. Advised may also receive vaccine at local pharmacy or Health Dept. Verbalized acceptance and understanding.  Screening Tests Health Maintenance  Topic Date Due   OPHTHALMOLOGY EXAM  Never done   Zoster Vaccines-  Shingrix (1 of 2) Never done   URINE MICROALBUMIN  07/08/2021 (Originally 03/21/1936)   DEXA SCAN  06/08/2022 (Originally 03/21/1993)   INFLUENZA VACCINE  07/13/2021   HEMOGLOBIN A1C  07/29/2021   FOOT EXAM  05/04/2022   TETANUS/TDAP  12/14/2023   COVID-19 Vaccine  Completed   PNA vac Low Risk Adult  Completed   HPV VACCINES  Aged Out    Health Maintenance  Health Maintenance Due  Topic Date Due   OPHTHALMOLOGY EXAM  Never done   Zoster Vaccines- Shingrix (1 of 2) Never done    Colorectal cancer screening: No longer required.   Mammogram status: No longer required due to advanced age.  Bone Density status: Completed Not completed due to advanced age. Results reflect: Bone density results: OSTEOPOROSIS. Repeat every not done due to advanced age years.  Lung Cancer Screening: (Low Dose CT Chest recommended if Age 35-80 years, 30 pack-year currently smoking OR have quit w/in 15years.) does not qualify.   Lung Cancer Screening Referral: No  Additional Screening:  Hepatitis C Screening: does not qualify; Completed No  Vision Screening: Recommended annual ophthalmology exams for early detection of glaucoma and other disorders of the eye. Is the patient up to date with their annual eye exam?  No  Who is the provider or what is the name of the office in which the patient attends annual eye exams? N/A If pt is not established with a  provider, would they like to be referred to a provider to establish care? No .   Dental Screening: Recommended annual dental exams for proper oral hygiene  Community Resource Referral / Chronic Care Management: CRR required this visit?  No   CCM required this visit?  No      Plan:     I have personally reviewed and noted the following in the patient's chart:   Medical and social history Use of alcohol, tobacco or illicit drugs  Current medications and supplements including opioid prescriptions.  Functional ability and status Nutritional  status Physical activity Advanced directives List of other physicians Hospitalizations, surgeries, and ER visits in previous 12 months Vitals Screenings to include cognitive, depression, and falls Referrals and appointments  In addition, I have reviewed and discussed with patient certain preventive protocols, quality metrics, and best practice recommendations. A written personalized care plan for preventive services as well as general preventive health recommendations were provided to patient.     Octavia Heir, NP   07/01/2021

## 2021-07-16 ENCOUNTER — Non-Acute Institutional Stay (SKILLED_NURSING_FACILITY): Payer: PPO | Admitting: Internal Medicine

## 2021-07-16 ENCOUNTER — Encounter: Payer: Self-pay | Admitting: Internal Medicine

## 2021-07-16 DIAGNOSIS — R7303 Prediabetes: Secondary | ICD-10-CM

## 2021-07-16 DIAGNOSIS — I10 Essential (primary) hypertension: Secondary | ICD-10-CM | POA: Diagnosis not present

## 2021-07-16 DIAGNOSIS — I6302 Cerebral infarction due to thrombosis of basilar artery: Secondary | ICD-10-CM | POA: Diagnosis not present

## 2021-07-16 DIAGNOSIS — F418 Other specified anxiety disorders: Secondary | ICD-10-CM | POA: Diagnosis not present

## 2021-07-16 DIAGNOSIS — K219 Gastro-esophageal reflux disease without esophagitis: Secondary | ICD-10-CM | POA: Diagnosis not present

## 2021-07-16 DIAGNOSIS — E7849 Other hyperlipidemia: Secondary | ICD-10-CM | POA: Diagnosis not present

## 2021-07-16 DIAGNOSIS — F0150 Vascular dementia without behavioral disturbance: Secondary | ICD-10-CM | POA: Diagnosis not present

## 2021-07-16 DIAGNOSIS — D649 Anemia, unspecified: Secondary | ICD-10-CM

## 2021-07-16 DIAGNOSIS — E538 Deficiency of other specified B group vitamins: Secondary | ICD-10-CM

## 2021-07-16 NOTE — Progress Notes (Signed)
Location:   [facility] Room Number: 18 Place of Service:  SNF [phone]) Provider: Einar Crow MD  [name], MD  Patient Care Team: [name], MD as PCP - General (Internal Medicine) Ngetich, Donalee Citrin, NP as Nurse Practitioner [facility])  Extended Emergency Contact Information Primary Emergency Contact: Gordan,[place] Address: [address]          [place], [place] [address] [place] of [place] Home Phone: [phone] Relation: Daughter Secondary Emergency Contact: Iven Finn Address: [address] [place] [address]          [place], [place] [address] [place] of [place] Home Phone: [phone] Relation: Spouse  Code Status:  DNR Managed Care Goals of care: Advanced Directive information Advanced Directives 07/16/2021  Does Patient Have a Medical Advance Directive? Yes  Type of Estate agent of [place];Living will;Out of facility DNR (pink MOST or yellow form)  Does patient want to make changes to medical advance directive? No - Patient declined  Copy of Healthcare Power of Attorney in Chart? Yes - validated most recent copy scanned in chart (See row information)  Pre-existing out of facility DNR order (yellow form or pink MOST form) Yellow form placed in chart (order not valid for inpatient use)     Chief Complaint  Patient presents with   Medical Management of Chronic Issues   Health Maintenance    Eye exam, urine microalbumin, shingrix    HPI:  Pt is a 87 y.o. female seen today for medical management of chronic diseases.    Patient has history of dementia, anxiety, With Skin Picking  hypertension and hyperlipidemia  History of  Acute CVA  MRI showed Acute infarction on Left Para median Pons   Doing well Did nto have any [place] Skin looks better so probably not picking on it Can do her stransfers No Falls recently Wheelchair Depdent   Past Medical History:  Diagnosis Date   Allergic rhinitis    Anxiety     Balance problem 04/28/2015   Fatigue 04/28/2015   Hearing loss 04/28/2015   history of Fracture of right humerus 04/28/2015   HLD (hyperlipidemia) 03/05/2019   Hyperglycemia    Hyperlipidemia    Hypertension    Memory loss 04/28/2015   04/26/2014 MMSE 28/30. Failed clock drawing. 04/21/15 MMSE 21/30. Failed clock drawing.    Osteoporosis    Past Surgical History:  Procedure Laterality Date   ABDOMINAL HYSTERECTOMY     CATARACT EXTRACTION  2010   ELBOW SURGERY Right 1988   FEMUR FRACTURE SURGERY Right 06/2014   [place], [place] Dr. Dione Housekeeper   HUMERUS FRACTURE SURGERY Right 2015   [place], [place] Dr. Eber Hong   TONSILLECTOMY  [phone]    Allergies  Allergen Reactions   Ace Inhibitors Other (See Comments)    Unknown reaction   Almond Oil Rash   Plum Pulp Rash    Allergies as of 07/16/2021       Reactions   Ace Inhibitors Other (See Comments)   Unknown reaction   Almond Oil Rash   Plum Pulp Rash        Medication List        Accurate as of July 16, 2021  9:41 AM. If you have any questions, ask your nurse or doctor.          amLODipine 5 MG tablet Commonly known as: NORVASC Take 5 mg by mouth daily.   atorvastatin 40 MG tablet Commonly known as: LIPITOR Take 1  tablet (40 mg total) by mouth daily at 6 PM.   calcium carbonate 1500 (600 Ca) MG Tabs tablet Commonly known as: OSCAL Take 600 mg of elemental calcium by mouth daily with breakfast.   clopidogrel 75 MG tablet Commonly known as: PLAVIX Take 1 tablet (75 mg total) by mouth daily.   donepezil 10 MG tablet Commonly known as: ARICEPT TAKE ONE TABLET BY MOUTH DAILY TO PRESERVE MEMORY   Fish Oil 500 MG Caps Take 1 capsule by mouth daily.   memantine 10 MG tablet Commonly known as: Namenda Take 1 tablet (10 mg total) by mouth 2 (two) times daily.   pantoprazole 40 MG tablet Commonly known as: Protonix Take 1 tablet (40 mg total) by mouth daily.   sertraline 50 MG tablet Commonly known as:  ZOLOFT Take 50 mg by mouth daily.   triamcinolone cream 0.1 % Commonly known as: KENALOG Apply 1 application topically 2 (two) times daily as needed.   Vitamin D3 25 MCG (1000 UT) Caps Take 1,000 Units daily by mouth.   zinc oxide 20 % ointment Apply 1 application topically as needed for irritation. Apply to peri/buttocks after every incontinent episode        Review of Systems  Unable to perform ROS: Dementia   Immunization History  Administered Date(s) Administered   DTaP 05/12/2014   Influenza, High Dose Seasonal PF 09/21/2017, 09/27/2019, 09/16/2020   Influenza,inj,Quad PF,6+ Mos 09/14/2018   Influenza-Unspecified 10/07/2014, 09/11/2015, 09/23/2016   Moderna SARS-COV2 Booster Vaccination 05/20/2021   Moderna Sars-Covid-2 Vaccination 12/17/2019, 01/14/2020, 10/27/2020   PPD Test 10/16/2014   Pneumococcal Conjugate-13 10/20/2017   Pneumococcal Polysaccharide-23 08/20/2011   Pertinent  Health Maintenance Due  Topic Date Due   OPHTHALMOLOGY EXAM  Never done   URINE MICROALBUMIN  Never done   INFLUENZA VACCINE  07/13/2021   DEXA SCAN  06/08/2022 (Originally 03/21/1993)   HEMOGLOBIN A1C  07/29/2021   FOOT EXAM  05/04/2022   PNA vac Low Risk Adult  Completed   Fall Risk  07/01/2021 11/21/2018 10/14/2017 02/17/2016 01/06/2015  Falls in the past year? 1 0 No No Yes  Comment - - - - fx (R) humerus; fx (R) femur  Number falls in past yr: 0 0 - - 2 or more  Injury with Fall? 0 0 - - -  Risk for fall due to : History of fall(s);Impaired balance/gait;Impaired mobility - - - History of fall(s)  Follow up Falls evaluation completed;Education provided;Falls prevention discussed - - - -   Functional Status Survey:    Vitals:   07/16/21 0929  BP: 133/75  Pulse: 64  Resp: 18  Temp: (!) 97.4 F (36.3 C)  SpO2: 96%  Weight: 146 lb 8 oz (66.5 kg)  Height: 5\' 2"  (1.575 m)   Body mass index is 26.8 kg/m. Physical Exam Vitals reviewed.  Constitutional:      Appearance: Normal  appearance.  HENT:     Head: Normocephalic.     Nose: Nose normal.     Mouth/Throat:     Mouth: Mucous membranes are moist.     Pharynx: Oropharynx is clear.  Eyes:     Pupils: Pupils are equal, round, and reactive to light.  Cardiovascular:     Rate and Rhythm: Normal rate and regular rhythm.     Pulses: Normal pulses.     Heart sounds: Normal heart sounds.  Pulmonary:     Effort: Pulmonary effort is normal.     Breath sounds: Normal breath sounds.  Abdominal:  General: Abdomen is flat. Bowel sounds are normal.     Palpations: Abdomen is soft.  Musculoskeletal:        General: No swelling.     Cervical back: Normal range of motion and neck supple.  Skin:    General: Skin is warm and dry.  Neurological:     General: No focal deficit present.     Mental Status: She is alert.  Psychiatric:        Mood and Affect: Mood normal.        Thought Content: Thought content normal.    Labs reviewed: Recent Labs    01/26/21 0000 01/29/21 0000  NA 139 145  K 4.7 4.5  CL 102 109*  CO2 31* 30*  BUN 24* 27*  CREATININE 1.2* 1.0  CALCIUM 8.8 8.9   Recent Labs    01/26/21 0000 01/29/21 0000  AST 13 13  ALT 12 17  ALKPHOS 55 79  ALBUMIN 3.1* 3.5   Recent Labs    01/29/21 0000 03/13/21 0000 03/16/21 1445  WBC 8.0 10.1 10.1  NEUTROABS 4,384.00 6,090.00 6,090.00  HGB 10.6* 11.3* 11.3*  HCT 33* 36 36  PLT 229 242 242   Lab Results  Component Value Date   TSH 2.39 04/03/2020   Lab Results  Component Value Date   HGBA1C 6.0 01/29/2021   Lab Results  Component Value Date   CHOL 138 01/26/2021   HDL 43 01/26/2021   LDLCALC 78 01/26/2021   TRIG 83 01/26/2021   CHOLHDL 6.3 03/06/2019    Significant Diagnostic Results in last 30 days:  No results found.  Assessment/Plan Vascular dementia without behavioral disturbance (HCC) Supportive care On Aricpet and Namenda Primary hypertension On Norvasc Hyperlipidemia Continue Statin LDL less then  100 Depression with anxiety Doing well on Zoloft No GDR  GERD Continue Protonix Cerebrovascular accident (CVA) due to thrombosis of basilar artery (HCC) On Plavix and Statin Prediabetes A1C 6 Anemia, unspecified type HGB good Iron stores good Vitamin B 12 deficiency Will start B12 1000 mcg   Family/ staff Communication:   Labs/tests ordered:

## 2021-08-13 DIAGNOSIS — F0390 Unspecified dementia without behavioral disturbance: Secondary | ICD-10-CM | POA: Diagnosis not present

## 2021-08-13 DIAGNOSIS — I639 Cerebral infarction, unspecified: Secondary | ICD-10-CM | POA: Diagnosis not present

## 2021-08-13 DIAGNOSIS — R2689 Other abnormalities of gait and mobility: Secondary | ICD-10-CM | POA: Diagnosis not present

## 2021-08-13 DIAGNOSIS — R4789 Other speech disturbances: Secondary | ICD-10-CM | POA: Diagnosis not present

## 2021-08-13 DIAGNOSIS — R278 Other lack of coordination: Secondary | ICD-10-CM | POA: Diagnosis not present

## 2021-08-13 DIAGNOSIS — M810 Age-related osteoporosis without current pathological fracture: Secondary | ICD-10-CM | POA: Diagnosis not present

## 2021-08-13 DIAGNOSIS — R2681 Unsteadiness on feet: Secondary | ICD-10-CM | POA: Diagnosis not present

## 2021-08-13 DIAGNOSIS — I69928 Other speech and language deficits following unspecified cerebrovascular disease: Secondary | ICD-10-CM | POA: Diagnosis not present

## 2021-08-13 DIAGNOSIS — M6281 Muscle weakness (generalized): Secondary | ICD-10-CM | POA: Diagnosis not present

## 2021-08-19 ENCOUNTER — Encounter: Payer: Self-pay | Admitting: Orthopedic Surgery

## 2021-08-19 ENCOUNTER — Non-Acute Institutional Stay (SKILLED_NURSING_FACILITY): Payer: PPO | Admitting: Orthopedic Surgery

## 2021-08-19 DIAGNOSIS — F0150 Vascular dementia without behavioral disturbance: Secondary | ICD-10-CM | POA: Diagnosis not present

## 2021-08-19 DIAGNOSIS — F424 Excoriation (skin-picking) disorder: Secondary | ICD-10-CM | POA: Diagnosis not present

## 2021-08-19 DIAGNOSIS — K219 Gastro-esophageal reflux disease without esophagitis: Secondary | ICD-10-CM | POA: Diagnosis not present

## 2021-08-19 DIAGNOSIS — F419 Anxiety disorder, unspecified: Secondary | ICD-10-CM

## 2021-08-19 DIAGNOSIS — E7849 Other hyperlipidemia: Secondary | ICD-10-CM | POA: Diagnosis not present

## 2021-08-19 DIAGNOSIS — R7303 Prediabetes: Secondary | ICD-10-CM | POA: Diagnosis not present

## 2021-08-19 DIAGNOSIS — I10 Essential (primary) hypertension: Secondary | ICD-10-CM

## 2021-08-19 NOTE — Progress Notes (Signed)
Location:   [facility] Room Number: N18 Place of Service:  SNF (31) Provider:  Hazle Nordmann, NP    Patient Care Team: [name], MD as PCP - General (Internal Medicine) Ngetich, Donalee Citrin, NP as Nurse Practitioner (Family Medicine)  Extended Emergency Contact Information Primary Emergency Contact: Gordan,[place] Address: [address]          [place], [place] [address] [place] of [place] Home Phone: [phone] Relation: Daughter Secondary Emergency Contact: Hickle,Donald Address: [address]          [place], [place] [address] [place] Home Phone: [phone] Relation: Spouse  Code Status:  DNR Goals of care: Advanced Directive information Advanced Directives 08/19/2021  Does Patient Have a Medical Advance Directive? Yes  Type of Estate agent of [place];Living will;Out of facility DNR (pink MOST or yellow form)  Does patient want to make changes to medical advance directive? No - Patient declined  Copy of Healthcare Power of Attorney in Chart? Yes - validated most recent copy scanned in chart (See row information)  Pre-existing out of facility DNR order (yellow form or pink MOST form) Yellow form placed in chart (order not valid for inpatient use)     Chief Complaint  Patient presents with   Medical Management of Chronic Issues    Routine follow up    Health Maintenance    Discuss the need for ophthalmology exam, urine microalbumin, shingles vaccine, influenza vaccine and hemoglobin a1c.    HPI:  Pt is a 87 y.o. female seen today for medical management of chronic diseases.    She currently resides on the skilled nursing unit at [facility]. Past medical conditions include: hypertension, stroke, dementia, GERD, diabetes, CKD stage 3, hyperlipidemia, depression, anxiety and abnormal gait.   Dementia- CT head 02/2019 confirmed small white matter disease, 06/2021 BIMS 13/15, daughter reports she is unable  to recognize grandchildren faces, husband confirms poor memory but does not think it is worse, remains on Aricept and Namenda.  Skin picking- recently picked area on right cheek and left upper chest, observed picking during encounter, triamcinolone cream prn daily Anxiety- appears more anxious today while picking skin, Zoloft 50 mg daily HTN- BUN/creat 27/1.0 01/29/2021, amlodipine 5 mg daily HLD- LDL 78 01/26/2021, Lipitor 40 mg daily GERD- stable with Protonix 40 mg daily, hgb 11.3 03/16/2021 Prediabetes- a1c 6.0 01/29/2021  No recent falls or injuries, ambulates with wheelchair, observed in bed most days.   Recent blood pressures:  09/07- 143/72  09/06- 128/72, 130/78  09/05- 122/71  Recent weights:  09/02- 145.5 lbs  08/01- 146.5 lbs  07/01- 144.9 lbs     Past Medical History:  Diagnosis Date   Allergic rhinitis    Anxiety    Balance problem 04/28/2015   Fatigue 04/28/2015   Hearing loss 04/28/2015   history of Fracture of right humerus 04/28/2015   HLD (hyperlipidemia) 03/05/2019   Hyperglycemia    Hyperlipidemia    Hypertension    Memory loss 04/28/2015   04/26/2014 MMSE 28/30. Failed clock drawing. 04/21/15 MMSE 21/30. Failed clock drawing.    Osteoporosis    Past Surgical History:  Procedure Laterality Date   ABDOMINAL HYSTERECTOMY     CATARACT EXTRACTION  2010   ELBOW SURGERY Right 1988   FEMUR FRACTURE SURGERY Right 06/2014   [place], [place] Dr. Dione Housekeeper   HUMERUS FRACTURE SURGERY Right 2015   [place], [place] Dr. Eber Hong   TONSILLECTOMY  [phone]  Allergies  Allergen Reactions   Ace Inhibitors Other (See Comments)    Unknown reaction   Almond Oil Rash   Plum Pulp Rash    Allergies as of 08/19/2021       Reactions   Ace Inhibitors Other (See Comments)   Unknown reaction   Almond Oil Rash   Plum Pulp Rash        Medication List        Accurate as of August 19, 2021  9:46 AM. If you have any questions, ask your nurse or doctor.           amLODipine 5 MG tablet Commonly known as: NORVASC Take 5 mg by mouth daily.   atorvastatin 40 MG tablet Commonly known as: LIPITOR Take 1 tablet (40 mg total) by mouth daily at 6 PM.   B12-Active 1 MG Chew Generic drug: Methylcobalamin Chew 1 tablet by mouth daily.   calcium carbonate 1500 (600 Ca) MG Tabs tablet Commonly known as: OSCAL Take 600 mg of elemental calcium by mouth daily with breakfast.   clopidogrel 75 MG tablet Commonly known as: PLAVIX Take 1 tablet (75 mg total) by mouth daily.   donepezil 10 MG tablet Commonly known as: ARICEPT TAKE ONE TABLET BY MOUTH DAILY TO PRESERVE MEMORY   Fish Oil 500 MG Caps Take 1 capsule by mouth daily.   memantine 10 MG tablet Commonly known as: Namenda Take 1 tablet (10 mg total) by mouth 2 (two) times daily.   pantoprazole 40 MG tablet Commonly known as: Protonix Take 1 tablet (40 mg total) by mouth daily.   sertraline 50 MG tablet Commonly known as: ZOLOFT Take 50 mg by mouth daily.   triamcinolone cream 0.1 % Commonly known as: KENALOG Apply 1 application topically 2 (two) times daily as needed.   Vitamin D3 25 MCG (1000 UT) Caps Take 1,000 Units daily by mouth.   zinc oxide 20 % ointment Apply 1 application topically as needed for irritation. Apply to peri/buttocks after every incontinent episode        Review of Systems  Unable to perform ROS: Dementia   Immunization History  Administered Date(s) Administered   DTaP 05/12/2014   Influenza, High Dose Seasonal PF 09/21/2017, 09/27/2019, 09/16/2020   Influenza,inj,Quad PF,6+ Mos 09/14/2018   Influenza-Unspecified 10/07/2014, 09/11/2015, 09/23/2016   Moderna SARS-COV2 Booster Vaccination 05/20/2021   Moderna Sars-Covid-2 Vaccination 12/17/2019, 01/14/2020, 10/27/2020   PPD Test 10/16/2014   Pneumococcal Conjugate-13 10/20/2017   Pneumococcal Polysaccharide-23 08/20/2011   Pertinent  Health Maintenance Due  Topic Date Due   OPHTHALMOLOGY EXAM   Never done   URINE MICROALBUMIN  Never done   INFLUENZA VACCINE  07/13/2021   HEMOGLOBIN A1C  07/29/2021   DEXA SCAN  06/08/2022 (Originally 03/21/1993)   FOOT EXAM  05/04/2022   PNA vac Low Risk Adult  Completed   Fall Risk  07/01/2021 11/21/2018 10/14/2017 02/17/2016 01/06/2015  Falls in the past year? 1 0 No No Yes  Comment - - - - fx (R) humerus; fx (R) femur  Number falls in past yr: 0 0 - - 2 or more  Injury with Fall? 0 0 - - -  Risk for fall due to : History of fall(s);Impaired balance/gait;Impaired mobility - - - History of fall(s)  Follow up Falls evaluation completed;Education provided;Falls prevention discussed - - - -   Functional Status Survey:    Vitals:   08/19/21 0940  BP: (!) 143/72  Pulse: 61  Resp: 16  Temp: (!)  97.1 F (36.2 C)  SpO2: 95%  Weight: 145 lb 8 oz (66 kg)  Height: 5\' 2"  (1.575 m)   Body mass index is 26.61 kg/m. Physical Exam Vitals reviewed.  Constitutional:      General: She is not in acute distress. HENT:     Head: Normocephalic.     Right Ear: There is no impacted cerumen.     Left Ear: There is no impacted cerumen.     Nose: Nose normal.     Mouth/Throat:     Mouth: Mucous membranes are moist.  Eyes:     General:        Right eye: No discharge.        Left eye: No discharge.  Neck:     Vascular: No carotid bruit.  Cardiovascular:     Rate and Rhythm: Normal rate and regular rhythm.     Pulses: Normal pulses.     Heart sounds: Normal heart sounds. No murmur heard. Pulmonary:     Effort: Pulmonary effort is normal. No respiratory distress.     Breath sounds: Normal breath sounds. No wheezing.  Abdominal:     General: Bowel sounds are normal. There is no distension.     Palpations: Abdomen is soft.     Tenderness: There is no abdominal tenderness.  Musculoskeletal:     Cervical back: Normal range of motion.     Right lower leg: No edema.     Left lower leg: No edema.  Lymphadenopathy:     Cervical: No cervical adenopathy.   Skin:    General: Skin is warm and dry.     Comments: Pea-sized abrasion to right cheek, skin appears dry,erythema present, no drainage. Nickel sized abrasion to left upper chest, skin appears dry, erythema present, no drainage.   Neurological:     General: No focal deficit present.     Mental Status: She is alert. Mental status is at baseline.     Motor: Weakness present.     Gait: Gait abnormal.     Comments: wheelchair  Psychiatric:        Mood and Affect: Mood normal. Affect is flat.        Behavior: Behavior normal.        Cognition and Memory: Memory is impaired.    Labs reviewed: Recent Labs    01/26/21 0000 01/29/21 0000  NA 139 145  K 4.7 4.5  CL 102 109*  CO2 31* 30*  BUN 24* 27*  CREATININE 1.2* 1.0  CALCIUM 8.8 8.9   Recent Labs    01/26/21 0000 01/29/21 0000  AST 13 13  ALT 12 17  ALKPHOS 55 79  ALBUMIN 3.1* 3.5   Recent Labs    01/29/21 0000 03/13/21 0000 03/16/21 1445  WBC 8.0 10.1 10.1  NEUTROABS 4,384.00 6,090.00 6,090.00  HGB 10.6* 11.3* 11.3*  HCT 33* 36 36  PLT 229 242 242   Lab Results  Component Value Date   TSH 2.39 04/03/2020   Lab Results  Component Value Date   HGBA1C 6.0 01/29/2021   Lab Results  Component Value Date   CHOL 138 01/26/2021   HDL 43 01/26/2021   LDLCALC 78 01/26/2021   TRIG 83 01/26/2021   CHOLHDL 6.3 03/06/2019    Significant Diagnostic Results in last 30 days:  No results found.  Assessment/Plan 1. Vascular dementia without behavioral disturbance (HCC) - no recent behavioral outbursts - 06/2021 BIMS 13/15 - cont Aricept and namenda - cont skilled  nursing care  2. Skin-picking disorder - suspect due to increased anxiety and dementia - 2 areas to right cheek and left upper chest, CDI - will start triamcinolone cream 0.1 %- apply to right cheek and left upper chest bid x 14 days, then bid prn  3. Anxiety - appears a little anxious today, picking skin - will increase zoloft to 75 mg daily  4.  Primary hypertension - controlled - cont amlodipine 5 mg daily - cmp  5. Other hyperlipidemia - LDL 78 01/2021 - cont Lipitor 40 mg daily  6. Gastroesophageal reflux disease, unspecified whether esophagitis present - stable with Protonix 40 mg daily - cbc/diff  7. Prediabetes - a1c 6.0 01/2021 - a1c    Family/ staff Communication: plan discussed with patient, husband and nurse  Labs/tests ordered:  cbc/diff, cmp, a1c,TSH

## 2021-08-20 DIAGNOSIS — D649 Anemia, unspecified: Secondary | ICD-10-CM | POA: Diagnosis not present

## 2021-08-20 DIAGNOSIS — E039 Hypothyroidism, unspecified: Secondary | ICD-10-CM | POA: Diagnosis not present

## 2021-08-20 DIAGNOSIS — E119 Type 2 diabetes mellitus without complications: Secondary | ICD-10-CM | POA: Diagnosis not present

## 2021-08-21 LAB — COMPREHENSIVE METABOLIC PANEL
Albumin: 4 (ref 3.5–5.0)
Calcium: 9.2 (ref 8.7–10.7)
Globulin: 2.1

## 2021-08-21 LAB — CBC AND DIFFERENTIAL
HCT: 35 — AB (ref 36–46)
Hemoglobin: 11.3 — AB (ref 12.0–16.0)
Neutrophils Absolute: 6348
Platelets: 222 (ref 150–399)
WBC: 9.2

## 2021-08-21 LAB — BASIC METABOLIC PANEL
BUN: 29 — AB (ref 4–21)
CO2: 27 — AB (ref 13–22)
Chloride: 105 (ref 99–108)
Creatinine: 1.1 (ref 0.5–1.1)
Glucose: 120
Potassium: 4 (ref 3.4–5.3)
Sodium: 142 (ref 137–147)

## 2021-08-21 LAB — HEMOGLOBIN A1C: Hemoglobin A1C: 5.9

## 2021-08-21 LAB — HEPATIC FUNCTION PANEL
ALT: 16 (ref 7–35)
AST: 12 — AB (ref 13–35)
Alkaline Phosphatase: 74 (ref 25–125)
Bilirubin, Total: 0.4

## 2021-08-21 LAB — CBC: RBC: 3.71 — AB (ref 3.87–5.11)

## 2021-08-21 LAB — TSH: TSH: 2.78 (ref 0.41–5.90)

## 2021-08-24 DIAGNOSIS — I10 Essential (primary) hypertension: Secondary | ICD-10-CM | POA: Diagnosis not present

## 2021-08-24 DIAGNOSIS — D649 Anemia, unspecified: Secondary | ICD-10-CM | POA: Diagnosis not present

## 2021-08-24 DIAGNOSIS — N183 Chronic kidney disease, stage 3 unspecified: Secondary | ICD-10-CM | POA: Diagnosis not present

## 2021-08-24 DIAGNOSIS — E785 Hyperlipidemia, unspecified: Secondary | ICD-10-CM | POA: Diagnosis not present

## 2021-08-24 DIAGNOSIS — E1322 Other specified diabetes mellitus with diabetic chronic kidney disease: Secondary | ICD-10-CM | POA: Diagnosis not present

## 2021-08-24 DIAGNOSIS — E039 Hypothyroidism, unspecified: Secondary | ICD-10-CM | POA: Diagnosis not present

## 2021-08-24 LAB — BASIC METABOLIC PANEL
BUN: 26 — AB (ref 4–21)
CO2: 31 — AB (ref 13–22)
Chloride: 107 (ref 99–108)
Creatinine: 0.9 (ref 0.5–1.1)
Glucose: 85
Potassium: 3.8 (ref 3.4–5.3)
Sodium: 144 (ref 137–147)

## 2021-08-24 LAB — CBC AND DIFFERENTIAL
HCT: 37 (ref 36–46)
Hemoglobin: 11.7 — AB (ref 12.0–16.0)
Neutrophils Absolute: 5180
Platelets: 238 (ref 150–399)
WBC: 8.9

## 2021-08-24 LAB — TSH: TSH: 2.77 (ref 0.41–5.90)

## 2021-08-24 LAB — HEPATIC FUNCTION PANEL
ALT: 16 (ref 7–35)
AST: 14 (ref 13–35)
Alkaline Phosphatase: 77 (ref 25–125)
Bilirubin, Total: 0.4

## 2021-08-24 LAB — COMPREHENSIVE METABOLIC PANEL
Albumin: 4 (ref 3.5–5.0)
Calcium: 9.2 (ref 8.7–10.7)
Globulin: 2.4

## 2021-08-24 LAB — HEMOGLOBIN A1C: Hemoglobin A1C: 5.9

## 2021-08-24 LAB — CBC: RBC: 3.83 — AB (ref 3.87–5.11)

## 2021-09-14 DIAGNOSIS — I639 Cerebral infarction, unspecified: Secondary | ICD-10-CM | POA: Diagnosis not present

## 2021-09-14 DIAGNOSIS — R4789 Other speech disturbances: Secondary | ICD-10-CM | POA: Diagnosis not present

## 2021-09-14 DIAGNOSIS — I69928 Other speech and language deficits following unspecified cerebrovascular disease: Secondary | ICD-10-CM | POA: Diagnosis not present

## 2021-09-14 DIAGNOSIS — R278 Other lack of coordination: Secondary | ICD-10-CM | POA: Diagnosis not present

## 2021-09-14 DIAGNOSIS — M6281 Muscle weakness (generalized): Secondary | ICD-10-CM | POA: Diagnosis not present

## 2021-09-14 DIAGNOSIS — F0390 Unspecified dementia without behavioral disturbance: Secondary | ICD-10-CM | POA: Diagnosis not present

## 2021-09-16 ENCOUNTER — Encounter: Payer: Self-pay | Admitting: Orthopedic Surgery

## 2021-09-16 ENCOUNTER — Non-Acute Institutional Stay (SKILLED_NURSING_FACILITY): Payer: PPO | Admitting: Orthopedic Surgery

## 2021-09-16 DIAGNOSIS — F418 Other specified anxiety disorders: Secondary | ICD-10-CM

## 2021-09-16 DIAGNOSIS — K219 Gastro-esophageal reflux disease without esophagitis: Secondary | ICD-10-CM

## 2021-09-16 DIAGNOSIS — I10 Essential (primary) hypertension: Secondary | ICD-10-CM | POA: Diagnosis not present

## 2021-09-16 DIAGNOSIS — F424 Excoriation (skin-picking) disorder: Secondary | ICD-10-CM

## 2021-09-16 DIAGNOSIS — F419 Anxiety disorder, unspecified: Secondary | ICD-10-CM

## 2021-09-16 DIAGNOSIS — D649 Anemia, unspecified: Secondary | ICD-10-CM

## 2021-09-16 DIAGNOSIS — Z8673 Personal history of transient ischemic attack (TIA), and cerebral infarction without residual deficits: Secondary | ICD-10-CM

## 2021-09-16 DIAGNOSIS — E7849 Other hyperlipidemia: Secondary | ICD-10-CM | POA: Diagnosis not present

## 2021-09-16 DIAGNOSIS — F0150 Vascular dementia without behavioral disturbance: Secondary | ICD-10-CM

## 2021-09-16 DIAGNOSIS — R7303 Prediabetes: Secondary | ICD-10-CM | POA: Diagnosis not present

## 2021-09-16 NOTE — Progress Notes (Signed)
Location:   [facility] Room Number: 18 Place of Service:  SNF [phone]) Provider:  Hazle Nordmann, NP  [name], MD  Patient Care Team: [name], MD as PCP - General (Internal Medicine) Ngetich, Donalee Citrin, NP as Nurse Practitioner [facility] Medicine)  Extended Emergency Contact Information Primary Emergency Contact: Gordan,[place] Address: [address]          [place], [place] [address] Darden Amber of [place] Home Phone: [phone] Relation: Daughter Secondary Emergency Contact: Iven Finn Address: [address]          [place], [place] [address] [place] Home Phone: [phone] Relation: Spouse  Code Status:  DNR Goals of care: Advanced Directive information Advanced Directives 09/16/2021  Does Patient Have a Medical Advance Directive? Yes  Type of Estate agent of [place];Living will;Out of facility DNR (pink MOST or yellow form)  Does patient want to make changes to medical advance directive? No - Patient declined  Copy of Healthcare Power of Attorney in Chart? Yes - validated most recent copy scanned in chart (See row information)  Pre-existing out of facility DNR order (yellow form or pink MOST form) Yellow form placed in chart (order not valid for inpatient use)     Chief Complaint  Patient presents with   Medical Management of Chronic Issues    Routine follow up visit   Health Maintenance    Eye exam,Flu vaccine, Hemoglobin A1C    HPI:  Pt is a 87 y.o. female seen today for medical management of chronic diseases.    She currently resides on the skilled nursing unit at [facility]. Past medical conditions include: hypertension, stroke, dementia, GERD, diabetes, CKD stage 3, hyperlipidemia, depression, anxiety and abnormal gait.  Dementia- CT head 02/2019 confirmed small white matter disease, 06/2021 BIMS 13/15, no recent behavioral outbursts, she has been working with ST on memory exercises,  husband reports they are helping with her short term memory, she was able to recognize her husbands recent birthday and recall who passed away 2 months ago, remains on Aricept and Namenda.  Skin picking- no new areas of concern, triamcinolone cream prn Anxiety- no recent panic attacks, Zoloft increased to 75 mg last month, she is less anxious today HTN- BUN/creat 27/1.0 01/29/2021, amlodipine 5 mg daily HLD- LDL 78 01/26/2021, Lipitor 40 mg daily GERD- stable with Protonix 40 mg daily, hgb 11.3 08/21/2021 Prediabetes- a1c 5.9 08/21/2021 Anemia- hgb 11.3 08/21/2021, Iron 50, Vit B12 313 03/12/21 HX CVA- remains on plavix and statin  09/23 she was found on the bathroom floor, small skin tear to left elbow. Ambulates with wheelchair.   Recent blood pressures:  10/05- 134/73  10/04- 128/71, 124/75  10/03- 130/72  Recent weights:  10/05- 148.3 lbs  09/02- 145.5 lbs  08/01- 146.5 lbs     Past Medical History:  Diagnosis Date   Allergic rhinitis    Anxiety    Balance problem 04/28/2015   Fatigue 04/28/2015   Hearing loss 04/28/2015   history of Fracture of right humerus 04/28/2015   HLD (hyperlipidemia) 03/05/2019   Hyperglycemia    Hyperlipidemia    Hypertension    Memory loss 04/28/2015   04/26/2014 MMSE 28/30. Failed clock drawing. 04/21/15 MMSE 21/30. Failed clock drawing.    Osteoporosis    Past Surgical History:  Procedure Laterality Date   ABDOMINAL HYSTERECTOMY     CATARACT EXTRACTION  2010   ELBOW SURGERY Right 1988   FEMUR FRACTURE SURGERY Right  06/2014   [place] Dr. Dione Housekeeper   HUMERUS FRACTURE SURGERY Right 2015   [place], [place] Dr. Eber Hong   TONSILLECTOMY  [phone]    Allergies  Allergen Reactions   Ace Inhibitors Other (See Comments)    Unknown reaction   Almond Oil Rash   Plum Pulp Rash    Allergies as of 09/16/2021       Reactions   Ace Inhibitors Other (See Comments)   Unknown reaction   Almond Oil Rash   Plum Pulp Rash         Medication List        Accurate as of September 16, 2021  9:38 AM. If you have any questions, ask your nurse or doctor.          amLODipine 5 MG tablet Commonly known as: NORVASC Take 5 mg by mouth daily.   atorvastatin 40 MG tablet Commonly known as: LIPITOR Take 1 tablet (40 mg total) by mouth daily at 6 PM.   B12-Active 1 MG Chew Generic drug: Methylcobalamin Chew 1 tablet by mouth daily.   calcium carbonate 1500 (600 Ca) MG Tabs tablet Commonly known as: OSCAL Take 600 mg of elemental calcium by mouth daily with breakfast.   clopidogrel 75 MG tablet Commonly known as: PLAVIX Take 1 tablet (75 mg total) by mouth daily.   donepezil 10 MG tablet Commonly known as: ARICEPT TAKE ONE TABLET BY MOUTH DAILY TO PRESERVE MEMORY   Fish Oil 500 MG Caps Take 1 capsule by mouth daily.   memantine 10 MG tablet Commonly known as: Namenda Take 1 tablet (10 mg total) by mouth 2 (two) times daily.   pantoprazole 40 MG tablet Commonly known as: Protonix Take 1 tablet (40 mg total) by mouth daily.   sertraline 25 MG tablet Commonly known as: ZOLOFT Take 25 mg by mouth daily. Take three tablets =75 mg daily   triamcinolone cream 0.1 % Commonly known as: KENALOG Apply 1 application topically 2 (two) times daily.   Vitamin D3 25 MCG (1000 UT) Caps Take 1,000 Units daily by mouth.   zinc oxide 20 % ointment Apply 1 application topically as needed for irritation. Apply to peri/buttocks after every incontinent episode        Review of Systems  Unable to perform ROS: Dementia   Immunization History  Administered Date(s) Administered   DTaP 05/12/2014   Influenza, High Dose Seasonal PF 09/21/2017, 09/27/2019, 09/16/2020   Influenza,inj,Quad PF,6+ Mos 09/14/2018   Influenza-Unspecified 10/07/2014, 09/11/2015, 09/23/2016   Moderna SARS-COV2 Booster Vaccination 05/20/2021   Moderna Sars-Covid-2 Vaccination 12/17/2019, 01/14/2020, 10/27/2020   PPD Test 10/16/2014    Pneumococcal Conjugate-13 10/20/2017   Pneumococcal Polysaccharide-23 08/20/2011   Pertinent  Health Maintenance Due  Topic Date Due   OPHTHALMOLOGY EXAM  Never done   INFLUENZA VACCINE  07/13/2021   HEMOGLOBIN A1C  07/29/2021   DEXA SCAN  06/08/2022 (Originally 03/21/1993)   FOOT EXAM  05/04/2022   URINE MICROALBUMIN  Discontinued   Fall Risk  07/01/2021 11/21/2018 10/14/2017 02/17/2016 01/06/2015  Falls in the past year? 1 0 No No Yes  Comment - - - - fx (R) humerus; fx (R) femur  Number falls in past yr: 0 0 - - 2 or more  Injury with Fall? 0 0 - - -  Risk for fall due to : History of fall(s);Impaired balance/gait;Impaired mobility - - - History of fall(s)  Follow up Falls evaluation completed;Education provided;Falls prevention discussed - - - -  Functional Status Survey:    Vitals:   09/16/21 0922  BP: 134/73  Pulse: 85  Resp: 18  Temp: (!) 97.2 F (36.2 C)  SpO2: 95%  Weight: 145 lb 8 oz (66 kg)  Height: 5\' 2"  (1.575 m)   Body mass index is 26.61 kg/m. Physical Exam Vitals reviewed.  Constitutional:      General: She is not in acute distress. HENT:     Head: Normocephalic.     Right Ear: There is no impacted cerumen.     Left Ear: There is no impacted cerumen.     Nose: Nose normal.     Mouth/Throat:     Mouth: Mucous membranes are moist.  Eyes:     General:        Right eye: No discharge.        Left eye: No discharge.  Neck:     Vascular: No carotid bruit.  Cardiovascular:     Rate and Rhythm: Normal rate and regular rhythm.     Pulses: Normal pulses.     Heart sounds: Normal heart sounds. No murmur heard. Pulmonary:     Effort: Pulmonary effort is normal. No respiratory distress.     Breath sounds: Normal breath sounds. No wheezing.  Abdominal:     General: Bowel sounds are normal. There is no distension.     Palpations: Abdomen is soft.     Tenderness: There is no abdominal tenderness.  Musculoskeletal:     Cervical back: Normal range of motion.      Right lower leg: No edema.     Left lower leg: No edema.  Lymphadenopathy:     Cervical: No cervical adenopathy.  Skin:    General: Skin is warm and dry.     Capillary Refill: Capillary refill takes less than 2 seconds.  Neurological:     General: No focal deficit present.     Mental Status: She is alert.     Motor: Weakness present.     Gait: Gait abnormal.     Comments: wheelchair  Psychiatric:        Mood and Affect: Mood normal. Mood is not anxious.        Behavior: Behavior normal.        Cognition and Memory: Memory is impaired.    Labs reviewed: Recent Labs    01/26/21 0000 01/29/21 0000  NA 139 145  K 4.7 4.5  CL 102 109*  CO2 31* 30*  BUN 24* 27*  CREATININE 1.2* 1.0  CALCIUM 8.8 8.9   Recent Labs    01/26/21 0000 01/29/21 0000  AST 13 13  ALT 12 17  ALKPHOS 55 79  ALBUMIN 3.1* 3.5   Recent Labs    01/29/21 0000 03/13/21 0000 03/16/21 1445  WBC 8.0 10.1 10.1  NEUTROABS 4,384.00 6,090.00 6,090.00  HGB 10.6* 11.3* 11.3*  HCT 33* 36 36  PLT 229 242 242   Lab Results  Component Value Date   TSH 2.39 04/03/2020   Lab Results  Component Value Date   HGBA1C 6.0 01/29/2021   Lab Results  Component Value Date   CHOL 138 01/26/2021   HDL 43 01/26/2021   LDLCALC 78 01/26/2021   TRIG 83 01/26/2021   CHOLHDL 6.3 03/06/2019    Significant Diagnostic Results in last 30 days:  No results found.  Assessment/Plan 1. Vascular dementia without behavioral disturbance (HCC) - no recent behavioral outbursts - doing short term memory exercises with ST- husband reports some  improvement - cont skilled nursing care - cont aricept and namenda  2. Skin-picking disorder - no new areas of concern - improved since increasing zoloft last month - cont triamcinolone prn  3. Anxiety - see above - cont zoloft 75 mg daily  4. Primary hypertension - controlled - cont amlodipine  5. Other hyperlipidemia - LDL 78 01/26/2021 - Lipitor 40 mg daily  6.  Prediabetes - a1c 5.9 08/21/2021  7. Depression with anxiety - see above  8. Gastroesophageal reflux disease, unspecified whether esophagitis present - hgb 11.3 08/21/2021 - cont Protonix  9. Anemia, unspecified type - hgb 11.3 08/21/2021  10. History of CVA (cerebrovascular accident) - cont plavix and statin    Family/ staff Communication: plan discussed with patient, husband, and nurse  Labs/tests ordered:  none

## 2021-10-15 ENCOUNTER — Encounter: Payer: Self-pay | Admitting: Internal Medicine

## 2021-10-15 ENCOUNTER — Non-Acute Institutional Stay (SKILLED_NURSING_FACILITY): Payer: PPO | Admitting: Internal Medicine

## 2021-10-15 DIAGNOSIS — F424 Excoriation (skin-picking) disorder: Secondary | ICD-10-CM | POA: Diagnosis not present

## 2021-10-15 DIAGNOSIS — E7849 Other hyperlipidemia: Secondary | ICD-10-CM | POA: Diagnosis not present

## 2021-10-15 DIAGNOSIS — I10 Essential (primary) hypertension: Secondary | ICD-10-CM | POA: Diagnosis not present

## 2021-10-15 DIAGNOSIS — F0150 Vascular dementia without behavioral disturbance: Secondary | ICD-10-CM

## 2021-10-15 DIAGNOSIS — E538 Deficiency of other specified B group vitamins: Secondary | ICD-10-CM | POA: Diagnosis not present

## 2021-10-15 DIAGNOSIS — D649 Anemia, unspecified: Secondary | ICD-10-CM

## 2021-10-15 DIAGNOSIS — I6302 Cerebral infarction due to thrombosis of basilar artery: Secondary | ICD-10-CM | POA: Diagnosis not present

## 2021-10-15 DIAGNOSIS — F419 Anxiety disorder, unspecified: Secondary | ICD-10-CM | POA: Diagnosis not present

## 2021-10-15 NOTE — Progress Notes (Signed)
Location:   [facility] Room Number: 18 Place of Service:  SNF [phone]) Provider:  Einar Crow MD  [name], MD  Patient Care Team: [name], MD as PCP - General (Internal Medicine) Ngetich, Donalee Citrin, NP as Nurse Practitioner [facility] Medicine)  Extended Emergency Contact Information Primary Emergency Contact: Gordan,[place] Address: [address]          [place], [place] [address] Darden Amber of [place] Home Phone: [phone] Relation: Daughter Secondary Emergency Contact: Iven Finn Address: [address]          [place], [place] [address] [place] Home Phone: [phone] Relation: Spouse  Code Status:  DNR Managed Care Goals of care: Advanced Directive information Advanced Directives 10/15/2021  Does Patient Have a Medical Advance Directive? Yes  Type of Estate agent of [place];Living will;Out of facility DNR (pink MOST or yellow form)  Does patient want to make changes to medical advance directive? No - Patient declined  Copy of Healthcare Power of Attorney in Chart? Yes - validated most recent copy scanned in chart (See row information)  Pre-existing out of facility DNR order (yellow form or pink MOST form) Yellow form placed in chart (order not valid for inpatient use)     Chief Complaint  Patient presents with   Medical Management of Chronic Issues   Quality Metric Gaps    Eye exam, flu shot, #4 covid    HPI:  Pt is a 87 y.o. female seen today for medical management of chronic diseases.    Patient has history of dementia, anxiety, With Skin Picking  hypertension and hyperlipidemia  History of  Acute CVA  MRI showed Acute infarction on Left Para median Pons   Continues to stay stable Weight is stable No Acute issues She is alert but confused Answers appropriately Wheelchair dependent But can do her transfers    Past Medical History:  Diagnosis Date   Allergic rhinitis    Anxiety     Balance problem 04/28/2015   Fatigue 04/28/2015   Hearing loss 04/28/2015   history of Fracture of right humerus 04/28/2015   HLD (hyperlipidemia) 03/05/2019   Hyperglycemia    Hyperlipidemia    Hypertension    Memory loss 04/28/2015   04/26/2014 MMSE 28/30. Failed clock drawing. 04/21/15 MMSE 21/30. Failed clock drawing.    Osteoporosis    Past Surgical History:  Procedure Laterality Date   ABDOMINAL HYSTERECTOMY     CATARACT EXTRACTION  2010   ELBOW SURGERY Right 1988   FEMUR FRACTURE SURGERY Right 06/2014   [place], [place] Dr. Dione Housekeeper   HUMERUS FRACTURE SURGERY Right 2015   [place], [place] Dr. Eber Hong   TONSILLECTOMY  [phone]    Allergies  Allergen Reactions   Ace Inhibitors Other (See Comments)    Unknown reaction   Almond Oil Rash   Plum Pulp Rash    Allergies as of 10/15/2021       Reactions   Ace Inhibitors Other (See Comments)   Unknown reaction   Almond Oil Rash   Plum Pulp Rash        Medication List        Accurate as of October 15, 2021  3:41 PM. If you have any questions, ask your nurse or doctor.          amLODipine 5 MG tablet Commonly known as: NORVASC Take 5 mg by mouth daily.   atorvastatin 40 MG tablet Commonly known as: LIPITOR Take  1 tablet (40 mg total) by mouth daily at 6 PM.   B12-Active 1 MG Chew Generic drug: Methylcobalamin Chew 1 tablet by mouth daily.   calcium carbonate 1500 (600 Ca) MG Tabs tablet Commonly known as: OSCAL Take 600 mg of elemental calcium by mouth daily with breakfast.   clopidogrel 75 MG tablet Commonly known as: PLAVIX Take 1 tablet (75 mg total) by mouth daily.   cyanocobalamin 1000 MCG tablet Take 1,000 mcg by mouth daily.   donepezil 10 MG tablet Commonly known as: ARICEPT TAKE ONE TABLET BY MOUTH DAILY TO PRESERVE MEMORY   Fish Oil 500 MG Caps Take 1 capsule by mouth daily.   memantine 10 MG tablet Commonly known as: Namenda Take 1 tablet (10 mg total) by mouth 2 (two) times  daily.   pantoprazole 40 MG tablet Commonly known as: Protonix Take 1 tablet (40 mg total) by mouth daily.   sertraline 25 MG tablet Commonly known as: ZOLOFT Take 25 mg by mouth daily. Take three tablets =75 mg daily   triamcinolone cream 0.1 % Commonly known as: KENALOG Apply 1 application topically 2 (two) times daily.   Vitamin D3 25 MCG (1000 UT) Caps Take 1,000 Units daily by mouth.   zinc oxide 20 % ointment Apply 1 application topically as needed for irritation. Apply to peri/buttocks after every incontinent episode        Review of Systems  Constitutional: Negative.   HENT: Negative.    Respiratory: Negative.    Gastrointestinal: Negative.   Genitourinary: Negative.   Musculoskeletal:  Positive for gait problem.  Skin: Negative.   Neurological:  Negative for weakness.  Psychiatric/Behavioral:  Positive for behavioral problems, confusion, dysphoric mood and self-injury. The patient is nervous/anxious.    Immunization History  Administered Date(s) Administered   DTaP 05/12/2014   Influenza, High Dose Seasonal PF 09/21/2017, 09/27/2019, 09/16/2020   Influenza,inj,Quad PF,6+ Mos 09/14/2018   Influenza-Unspecified 10/07/2014, 09/11/2015, 09/23/2016   Moderna SARS-COV2 Booster Vaccination 05/20/2021   Moderna Sars-Covid-2 Vaccination 12/17/2019, 01/14/2020, 10/27/2020   PPD Test 10/16/2014   Pneumococcal Conjugate-13 10/20/2017   Pneumococcal Polysaccharide-23 08/20/2011   Pertinent  Health Maintenance Due  Topic Date Due   OPHTHALMOLOGY EXAM  Never done   INFLUENZA VACCINE  07/13/2021   DEXA SCAN  06/08/2022 (Originally 03/21/1993)   HEMOGLOBIN A1C  02/21/2022   FOOT EXAM  05/04/2022   URINE MICROALBUMIN  Discontinued   Fall Risk 10/14/2017 11/21/2018 03/05/2019 03/06/2019 07/01/2021  Falls in the past year? No 0 - - 1  Was there an injury with Fall? - 0 - - 0  Fall Risk Category Calculator - 0 - - 1  Fall Risk Category - Low - - Low  Patient Fall Risk Level  - Low fall risk High fall risk High fall risk High fall risk  Patient at Risk for Falls Due to - - - - History of fall(s);Impaired balance/gait;Impaired mobility  Fall risk Follow up - - - - Falls evaluation completed;Education provided;Falls prevention discussed   Functional Status Survey:    Vitals:   10/15/21 1529  BP: 137/71  Pulse: 64  Resp: 20  Temp: (!) 97.3 F (36.3 C)  SpO2: 96%  Weight: 149 lb 14.4 oz (68 kg)  Height: 5\' 2"  (1.575 m)   Body mass index is 27.42 kg/m. Physical Exam Vitals reviewed.  Constitutional:      Appearance: Normal appearance.  HENT:     Head: Normocephalic.     Nose: Nose normal.  Mouth/Throat:     Mouth: Mucous membranes are moist.     Pharynx: Oropharynx is clear.  Eyes:     Pupils: Pupils are equal, round, and reactive to light.  Cardiovascular:     Rate and Rhythm: Normal rate and regular rhythm.     Pulses: Normal pulses.     Heart sounds: Normal heart sounds.  Pulmonary:     Effort: Pulmonary effort is normal.     Breath sounds: Normal breath sounds.  Abdominal:     General: Abdomen is flat. Bowel sounds are normal.     Palpations: Abdomen is soft.  Musculoskeletal:        General: No swelling.     Cervical back: Neck supple.  Skin:    General: Skin is warm.  Neurological:     Mental Status: She is alert.  Psychiatric:        Mood and Affect: Mood normal.        Thought Content: Thought content normal.    Labs reviewed: Recent Labs    01/29/21 0000 08/21/21 0000 08/24/21 0000  NA 145 142 144  K 4.5 4.0 3.8  CL 109* 105 107  CO2 30* 27* 31*  BUN 27* 29* 26*  CREATININE 1.0 1.1 0.9  CALCIUM 8.9 9.2 9.2   Recent Labs    01/29/21 0000 08/21/21 0000 08/24/21 0000  AST 13 12* 14  ALT 17 16 16   ALKPHOS 79 74 77  ALBUMIN 3.5 4.0 4.0   Recent Labs    03/16/21 1445 08/21/21 0000 08/24/21 0000  WBC 10.1 9.2 8.9  NEUTROABS 6,090.00 6,348.00 5,180.00  HGB 11.3* 11.3* 11.7*  HCT 36 35* 37  PLT 242 222  238   Lab Results  Component Value Date   TSH 2.77 08/24/2021   Lab Results  Component Value Date   HGBA1C 5.9 08/24/2021   Lab Results  Component Value Date   CHOL 138 01/26/2021   HDL 43 01/26/2021   LDLCALC 78 01/26/2021   TRIG 83 01/26/2021   CHOLHDL 6.3 03/06/2019    Significant Diagnostic Results in last 30 days:  No results found.  Assessment/Plan Vascular dementia without behavioral disturbance (HCC) Doing well with Supportive care On Aricept and Namenda Skin-picking disorder Zoloft continue No GDR Anxiety Continue on Zoloft Primary hypertension On Norvasc Other hyperlipidemia Continue statins LDL 78 in 2/22 Vitamin B 12 deficiency Continue supplement Anemia, unspecified type Hgb stab;le GERD Continue Protonix Cerebrovascular accident (CVA) due to thrombosis of basilar artery (HCC) On Plavix and Statin  Family/ staff Communication:   Labs/tests ordered:

## 2021-11-17 ENCOUNTER — Encounter: Payer: Self-pay | Admitting: Nurse Practitioner

## 2021-11-17 ENCOUNTER — Non-Acute Institutional Stay (SKILLED_NURSING_FACILITY): Payer: PPO | Admitting: Nurse Practitioner

## 2021-11-17 DIAGNOSIS — I10 Essential (primary) hypertension: Secondary | ICD-10-CM | POA: Diagnosis not present

## 2021-11-17 DIAGNOSIS — I6302 Cerebral infarction due to thrombosis of basilar artery: Secondary | ICD-10-CM | POA: Diagnosis not present

## 2021-11-17 DIAGNOSIS — D638 Anemia in other chronic diseases classified elsewhere: Secondary | ICD-10-CM

## 2021-11-17 DIAGNOSIS — F418 Other specified anxiety disorders: Secondary | ICD-10-CM

## 2021-11-17 DIAGNOSIS — F01C18 Vascular dementia, severe, with other behavioral disturbance: Secondary | ICD-10-CM | POA: Diagnosis not present

## 2021-11-17 DIAGNOSIS — R7303 Prediabetes: Secondary | ICD-10-CM

## 2021-11-17 DIAGNOSIS — K219 Gastro-esophageal reflux disease without esophagitis: Secondary | ICD-10-CM

## 2021-11-17 DIAGNOSIS — R269 Unspecified abnormalities of gait and mobility: Secondary | ICD-10-CM

## 2021-11-17 NOTE — Assessment & Plan Note (Signed)
stable, on Pantoprazole, Hgb 11.7 08/24/21

## 2021-11-17 NOTE — Assessment & Plan Note (Signed)
Hx of CVA, thrombosis of basilar artery, takes Plavix, statin, slightly RLE weakness.

## 2021-11-17 NOTE — Assessment & Plan Note (Signed)
uses w/c for mobility, transfer with supervision/assistance.

## 2021-11-17 NOTE — Assessment & Plan Note (Addendum)
skin picking habit, better,  taking Sertraline

## 2021-11-17 NOTE — Assessment & Plan Note (Signed)
an resident of SNF [facility], takes Menmantine, Donepezil for memory, TSH 2.77 08/24/21

## 2021-11-17 NOTE — Progress Notes (Signed)
Location:   [facility] Room Number: 18 A Place of Service:  SNF (31) Provider: [facility] Arnitra Sokoloski NP  [name], MD  Patient Care Team: [name], MD as PCP - General (Internal Medicine) Ngetich, Donalee Citrin, NP as Nurse Practitioner [facility])  Extended Emergency Contact Information Primary Emergency Contact: Gordan,[place] Address: [address]          [place], [place] [address] [place] of [place] Home Phone: [phone] Relation: Daughter Secondary Emergency Contact: Iven Finn Address: [address]          [place], [place] [address] [place] of [place] Home Phone: [phone] Relation: Spouse  Code Status:  DNR Goals of care: Advanced Directive information Advanced Directives 11/17/2021  Does Patient Have a Medical Advance Directive? Yes  Type of Estate agent of [place];Living will;Out of facility DNR (pink MOST or yellow form)  Does patient want to make changes to medical advance directive? No - Patient declined  Copy of Healthcare Power of Attorney in Chart? Yes - validated most recent copy scanned in chart (See row information)  Pre-existing out of facility DNR order (yellow form or pink MOST form) Yellow form placed in chart (order not valid for inpatient use)     Chief Complaint  Patient presents with   Medical Management of Chronic Issues    Routine Visit     HPI:  Pt is a 87 y.o. female seen today for medical management of chronic diseases.       GERD, stable, on Pantoprazole, Hgb 11.7 08/24/21             HTN, takes  amlodipine, Atorvastatin, Bun/creat 26/0.9 08/24/21             Dementia, an resident of SNF [facility], takes Menmantine, Donepezil for memory, TSH 2.77 08/24/21             Depression, skin picking habit, taking Sertraline             Hx of CVA, thrombosis of basilar artery, takes Plavix, statin, slightly RLE weakness.              Gait abnormality, uses w/c for mobility, transfer with  supervision/assistance.              Prediabetes, Hgb a1c 5.9 08/24/21             Anemia, takes Vit B12, Vit B12 313 03/12/21, Hgb 11.7 08/24/21  Past Medical History:  Diagnosis Date   Allergic rhinitis    Anxiety    Balance problem 04/28/2015   Fatigue 04/28/2015   Hearing loss 04/28/2015   history of Fracture of right humerus 04/28/2015   HLD (hyperlipidemia) 03/05/2019   Hyperglycemia    Hyperlipidemia    Hypertension    Memory loss 04/28/2015   04/26/2014 MMSE 28/30. Failed clock drawing. 04/21/15 MMSE 21/30. Failed clock drawing.    Osteoporosis    Past Surgical History:  Procedure Laterality Date   ABDOMINAL HYSTERECTOMY     CATARACT EXTRACTION  2010   ELBOW SURGERY Right 1988   FEMUR FRACTURE SURGERY Right 06/2014   [place], [place] Dr. Dione Housekeeper   HUMERUS FRACTURE SURGERY Right 2015   [place], [place] Dr. Eber Hong   TONSILLECTOMY  [phone]    Allergies  Allergen Reactions   Ace Inhibitors Other (See Comments)    Unknown reaction   Almond Oil Rash   Plum Pulp Rash    Allergies as of 11/17/2021  Reactions   Ace Inhibitors Other (See Comments)   Unknown reaction   Almond Oil Rash   Plum Pulp Rash        Medication List        Accurate as of November 17, 2021  4:27 PM. If you have any questions, ask your nurse or doctor.          amLODipine 5 MG tablet Commonly known as: NORVASC Take 5 mg by mouth daily.   atorvastatin 40 MG tablet Commonly known as: LIPITOR Take 1 tablet (40 mg total) by mouth daily at 6 PM.   B12-Active 1 MG Chew Generic drug: Methylcobalamin Chew 1 tablet by mouth daily.   calcium carbonate 1500 (600 Ca) MG Tabs tablet Commonly known as: OSCAL Take 600 mg of elemental calcium by mouth daily with breakfast.   clopidogrel 75 MG tablet Commonly known as: PLAVIX Take 1 tablet (75 mg total) by mouth daily.   cyanocobalamin 1000 MCG tablet Take 1,000 mcg by mouth daily.   donepezil 10 MG tablet Commonly known as:  ARICEPT TAKE ONE TABLET BY MOUTH DAILY TO PRESERVE MEMORY   Fish Oil 500 MG Caps Take 1 capsule by mouth daily.   memantine 10 MG tablet Commonly known as: Namenda Take 1 tablet (10 mg total) by mouth 2 (two) times daily.   pantoprazole 40 MG tablet Commonly known as: Protonix Take 1 tablet (40 mg total) by mouth daily.   sertraline 25 MG tablet Commonly known as: ZOLOFT Take 25 mg by mouth daily. Take three tablets =75 mg daily   triamcinolone cream 0.1 % Commonly known as: KENALOG Apply 1 application topically 2 (two) times daily.   Vitamin D3 25 MCG (1000 UT) Caps Take 1,000 Units daily by mouth.   zinc oxide 20 % ointment Apply 1 application topically as needed for irritation. Apply to peri/buttocks after every incontinent episode        Review of Systems  Unable to perform ROS: Dementia   Immunization History  Administered Date(s) Administered   DTaP 05/12/2014   Influenza, High Dose Seasonal PF 09/21/2017, 09/27/2019, 09/16/2020   Influenza,inj,Quad PF,6+ Mos 09/14/2018   Influenza-Unspecified 10/07/2014, 09/11/2015, 09/23/2016, 10/07/2021   Moderna SARS-COV2 Booster Vaccination 05/20/2021   Moderna Sars-Covid-2 Vaccination 12/17/2019, 01/14/2020, 10/27/2020   PPD Test 10/16/2014   Pneumococcal Conjugate-13 10/20/2017   Pneumococcal Polysaccharide-23 08/20/2011   Pertinent  Health Maintenance Due  Topic Date Due   OPHTHALMOLOGY EXAM  Never done   DEXA SCAN  06/08/2022 (Originally 03/21/1993)   HEMOGLOBIN A1C  02/21/2022   FOOT EXAM  05/04/2022   INFLUENZA VACCINE  Completed   URINE MICROALBUMIN  Discontinued   Fall Risk 10/14/2017 11/21/2018 03/05/2019 03/06/2019 07/01/2021  Falls in the past year? No 0 - - 1  Was there an injury with Fall? - 0 - - 0  Fall Risk Category Calculator - 0 - - 1  Fall Risk Category - Low - - Low  Patient Fall Risk Level - Low fall risk High fall risk High fall risk High fall risk  Patient at Risk for Falls Due to - - - -  History of fall(s);Impaired balance/gait;Impaired mobility  Fall risk Follow up - - - - Falls evaluation completed;Education provided;Falls prevention discussed   Functional Status Survey:    Vitals:   11/17/21 1515  BP: 138/77  Pulse: 97  Resp: 18  Temp: (!) 97.5 F (36.4 C)  SpO2: 96%  Weight: 151 lb 6.4 oz (68.7 kg)  Height: 5'  2" (1.575 m)   Body mass index is 27.69 kg/m. Physical Exam Vitals and nursing note reviewed.  Constitutional:      Appearance: Normal appearance.  HENT:     Head: Normocephalic and atraumatic.     Mouth/Throat:     Mouth: Mucous membranes are moist.  Eyes:     Extraocular Movements: Extraocular movements intact.     Conjunctiva/sclera: Conjunctivae normal.     Pupils: Pupils are equal, round, and reactive to light.  Cardiovascular:     Rate and Rhythm: Normal rate and regular rhythm.     Heart sounds: No murmur heard. Pulmonary:     Breath sounds: No rales.  Abdominal:     General: Bowel sounds are normal.     Palpations: Abdomen is soft.     Tenderness: There is no abdominal tenderness.  Musculoskeletal:     Cervical back: Normal range of motion and neck supple.     Right lower leg: No edema.     Left lower leg: No edema.  Skin:    General: Skin is warm and dry.     Comments: Scratched injuries are healed.    Neurological:     General: No focal deficit present.     Mental Status: She is alert. Mental status is at baseline.     Gait: Gait abnormal.     Comments: Oriented to self. Slightly weakness in RLE  Psychiatric:        Mood and Affect: Mood normal.        Behavior: Behavior normal.    Labs reviewed: Recent Labs    01/29/21 0000 08/21/21 0000 08/24/21 0000  NA 145 142 144  K 4.5 4.0 3.8  CL 109* 105 107  CO2 30* 27* 31*  BUN 27* 29* 26*  CREATININE 1.0 1.1 0.9  CALCIUM 8.9 9.2 9.2   Recent Labs    01/29/21 0000 08/21/21 0000 08/24/21 0000  AST 13 12* 14  ALT 17 16 16   ALKPHOS 79 74 77  ALBUMIN 3.5 4.0 4.0    Recent Labs    03/16/21 1445 08/21/21 0000 08/24/21 0000  WBC 10.1 9.2 8.9  NEUTROABS 6,090.00 6,348.00 5,180.00  HGB 11.3* 11.3* 11.7*  HCT 36 35* 37  PLT 242 222 238   Lab Results  Component Value Date   TSH 2.77 08/24/2021   Lab Results  Component Value Date   HGBA1C 5.9 08/24/2021   Lab Results  Component Value Date   CHOL 138 01/26/2021   HDL 43 01/26/2021   LDLCALC 78 01/26/2021   TRIG 83 01/26/2021   CHOLHDL 6.3 03/06/2019    Significant Diagnostic Results in last 30 days:  No results found.  Assessment/Plan GERD (gastroesophageal reflux disease) stable, on Pantoprazole, Hgb 11.7 08/24/21  Hypertension Blood pressure is controlled, takes  amlodipine, Atorvastatin, Bun/creat 26/0.9 08/24/21  Vascular dementia [facility])  an resident of SNF [facility], takes Menmantine, Donepezil for memory, TSH 2.77 08/24/21  Depression with anxiety skin picking habit, better,  taking Sertraline  Stroke (cerebrum) (HCC) Hx of CVA, thrombosis of basilar artery, takes Plavix, statin, slightly RLE weakness.   Gait abnormality uses w/c for mobility, transfer with supervision/assistance.   Prediabetes Hgb a1c 5.9 08/24/21  Anemia, chronic disease takes Vit B12, Hgb 11.7 08/24/21. 03/12/21 wbc 10.1, Hgb 11.3, plt 242, neutrophils 60.3, Iron 50, ferritin 15, Vit B12 313.    Family/ staff Communication: plan of care reviewed with the patient and charge nurse.   Labs/tests ordered: none  Time spend 35 minutes.

## 2021-11-17 NOTE — Assessment & Plan Note (Signed)
Hgb a1c 5.9 08/24/21

## 2021-11-17 NOTE — Assessment & Plan Note (Signed)
Blood pressure is controlled, takes  amlodipine, Atorvastatin, Bun/creat 26/0.9 08/24/21

## 2021-11-17 NOTE — Assessment & Plan Note (Addendum)
takes Vit B12, Hgb 11.7 08/24/21. 03/12/21 wbc 10.1, Hgb 11.3, plt 242, neutrophils 60.3, Iron 50, ferritin 15, Vit B12 313.

## 2021-11-20 ENCOUNTER — Encounter: Payer: Self-pay | Admitting: Nurse Practitioner

## 2021-12-23 ENCOUNTER — Non-Acute Institutional Stay (SKILLED_NURSING_FACILITY): Payer: PPO | Admitting: Orthopedic Surgery

## 2021-12-23 ENCOUNTER — Encounter: Payer: Self-pay | Admitting: Orthopedic Surgery

## 2021-12-23 DIAGNOSIS — K219 Gastro-esophageal reflux disease without esophagitis: Secondary | ICD-10-CM

## 2021-12-23 DIAGNOSIS — F424 Excoriation (skin-picking) disorder: Secondary | ICD-10-CM | POA: Diagnosis not present

## 2021-12-23 DIAGNOSIS — F419 Anxiety disorder, unspecified: Secondary | ICD-10-CM

## 2021-12-23 DIAGNOSIS — I10 Essential (primary) hypertension: Secondary | ICD-10-CM | POA: Diagnosis not present

## 2021-12-23 DIAGNOSIS — E7849 Other hyperlipidemia: Secondary | ICD-10-CM

## 2021-12-23 DIAGNOSIS — D638 Anemia in other chronic diseases classified elsewhere: Secondary | ICD-10-CM

## 2021-12-23 DIAGNOSIS — F01C18 Vascular dementia, severe, with other behavioral disturbance: Secondary | ICD-10-CM

## 2021-12-23 DIAGNOSIS — R7303 Prediabetes: Secondary | ICD-10-CM

## 2021-12-23 NOTE — Progress Notes (Signed)
Location:   [place] Room Number: 18 Place of Service:  SNF [phone]) Provider:  Windell Moulding, NP  Virgie Dad, MD  Patient Care Team: Virgie Dad, MD as PCP - General (Internal Medicine) Ngetich, Nelda Bucks, NP as Nurse Practitioner [facility] Medicine)  Extended Emergency Contact Information Primary Emergency Contact: Gordan,[place] Address: [address]          [place], [place] [address] Johnnette Litter of [place] Phone: [phone] Relation: Daughter Secondary Emergency Contact: Verneda Skill Address: [address]          [place], [place] [address] [place] of [place] Phone: [phone] Relation: Spouse  Code Status:  DNR Goals of care: Advanced Directive information Advanced Directives 12/23/2021  Does Patient Have a Medical Advance Directive? Yes  Type of Paramedic of [place];Living will;Out of facility DNR (pink MOST or yellow form)  Does patient want to make changes to medical advance directive? No - Patient declined  Copy of [place] in Chart? Yes - validated most recent copy scanned in chart (See row information)  Pre-existing out of facility DNR order (yellow form or pink MOST form) Yellow form placed in chart (order not valid for inpatient use)     Chief Complaint  Patient presents with   Medical Management of Chronic Issues    Routine follow up visit.   Health Maintenance    Eye exam, 2nd COVID booster    HPI:  Pt is a 88 y.o. female seen today for medical management of chronic diseases.    She currently resides on the skilled nursing unit at [facility]. Past medical conditions include: hypertension, stroke, dementia, GERD, diabetes, CKD stage 3, hyperlipidemia, depression, anxiety and abnormal gait.   Skin picking- small pea-sized lesions located over right forearm, forehead and nose, remains on triamcinolone prn  Dementia- CT head 02/2019 confirmed small white matter  disease, 06/2021 BIMS 13/15, no recent behavioral outbursts, attends some activities offered like Bingo or singing per husband, cannot remember husband today, remains on Aricept and Namenda.  Anxiety- no recent panic attacks, remains on zoloft HTN- BUN/creat 26/0.9 08/24/2021, amlodipine 5 mg daily HLD- LDL 78 01/26/2021, Lipitor 40 mg daily GERD- hgb 11.7 08/21/2021, stable with Protonix 40 mg daily Prediabetes- a1c 5.9 08/21/2021 Anemia- hgb 11.7 08/21/2021, Iron 50, Vit B12 313 03/12/21 HX CVA- remains on plavix and statin  12/28 found on the floor by staff, no injuries. Ambulates with wheelchair.   Recent blood pressures:  01/11- 135/78  01/10- 119/71, 130/71  01/09- 126/76  Recent weights:  01/03- 151.5 lbs  12/01- 151.4 lbs  11/02- 149.9 lbs     Past Medical History:  Diagnosis Date   Allergic rhinitis    Anxiety    Balance problem 04/28/2015   Fatigue 04/28/2015   Hearing loss 04/28/2015   history of Fracture of right humerus 04/28/2015   HLD (hyperlipidemia) 03/05/2019   Hyperglycemia    Hyperlipidemia    Hypertension    Memory loss 04/28/2015   04/26/2014 MMSE 28/30. Failed clock drawing. 04/21/15 MMSE 21/30. Failed clock drawing.    Osteoporosis    Past Surgical History:  Procedure Laterality Date   ABDOMINAL HYSTERECTOMY     CATARACT EXTRACTION  2010   ELBOW SURGERY Right 1988   FEMUR FRACTURE SURGERY Right 06/2014   [place], [place] Dr. Dorita Fray   HUMERUS FRACTURE SURGERY Right 2015   [place], [place] Dr. Noemi Chapel   TONSILLECTOMY  [phone]  Allergies  Allergen Reactions   Ace Inhibitors Other (See Comments)    Unknown reaction   Almond Oil Rash   Plum Pulp Rash    Allergies as of 12/23/2021       Reactions   Ace Inhibitors Other (See Comments)   Unknown reaction   Almond Oil Rash   Plum Pulp Rash        Medication List        Accurate as of December 23, 2021  2:33 PM. If you have any questions, ask your nurse or doctor.           amLODipine 5 MG tablet Commonly known as: NORVASC Take 5 mg by mouth daily.   atorvastatin 40 MG tablet Commonly known as: LIPITOR Take 1 tablet (40 mg total) by mouth daily at 6 PM.   B12-Active 1 MG Chew Generic drug: Methylcobalamin Chew 1 tablet by mouth daily.   calcium carbonate 1500 (600 Ca) MG Tabs tablet Commonly known as: OSCAL Take 600 mg of elemental calcium by mouth daily with breakfast.   clopidogrel 75 MG tablet Commonly known as: PLAVIX Take 1 tablet (75 mg total) by mouth daily.   donepezil 10 MG tablet Commonly known as: ARICEPT TAKE ONE TABLET BY MOUTH DAILY TO PRESERVE MEMORY   Fish Oil 500 MG Caps Take 1 capsule by mouth daily.   memantine 10 MG tablet Commonly known as: Namenda Take 1 tablet (10 mg total) by mouth 2 (two) times daily.   pantoprazole 40 MG tablet Commonly known as: Protonix Take 1 tablet (40 mg total) by mouth daily.   sertraline 25 MG tablet Commonly known as: ZOLOFT Take 25 mg by mouth daily. Take three tablets =75 mg daily   triamcinolone cream 0.1 % Commonly known as: KENALOG Apply 1 application topically 2 (two) times daily.   Vitamin D3 25 MCG (1000 UT) Caps Take 1,000 Units daily by mouth.   zinc oxide 20 % ointment Apply 1 application topically as needed for irritation. Apply to peri/buttocks after every incontinent episode        Review of Systems  Unable to perform ROS: Dementia   Immunization History  Administered Date(s) Administered   DTaP 05/12/2014   Influenza, High Dose Seasonal PF 09/21/2017, 09/27/2019, 09/16/2020   Influenza,inj,Quad PF,6+ Mos 09/14/2018   Influenza-Unspecified 10/07/2014, 09/11/2015, 09/23/2016, 10/07/2021   Moderna SARS-COV2 Booster Vaccination 05/20/2021   Moderna Sars-Covid-2 Vaccination 12/17/2019, 01/14/2020, 10/27/2020   PPD Test 10/16/2014   Pneumococcal Conjugate-13 10/20/2017   Pneumococcal Polysaccharide-23 08/20/2011   Pertinent  Health Maintenance Due  Topic  Date Due   OPHTHALMOLOGY EXAM  Never done   DEXA SCAN  06/08/2022 (Originally 03/21/1993)   HEMOGLOBIN A1C  02/21/2022   FOOT EXAM  05/04/2022   INFLUENZA VACCINE  Completed   URINE MICROALBUMIN  Discontinued   Fall Risk 10/14/2017 11/21/2018 03/05/2019 03/06/2019 07/01/2021  Falls in the past year? No 0 - - 1  Was there an injury with Fall? - 0 - - 0  Fall Risk Category Calculator - 0 - - 1  Fall Risk Category - Low - - Low  Patient Fall Risk Level - Low fall risk High fall risk High fall risk High fall risk  Patient at Risk for Falls Due to - - - - History of fall(s);Impaired balance/gait;Impaired mobility  Fall risk Follow up - - - - Falls evaluation completed;Education provided;Falls prevention discussed   Functional Status Survey:    Vitals:   12/23/21 1420  BP: 135/78  Pulse: 61  Resp: 20  Temp: 97.9 F (36.6 C)  SpO2: 95%  Weight: 151 lb 8 oz (68.7 kg)  Height: 5\' 2"  (1.575 m)   Body mass index is 27.71 kg/m. Physical Exam Vitals reviewed.  Constitutional:      General: She is not in acute distress. HENT:     Head: Normocephalic.     Right Ear: There is no impacted cerumen.     Left Ear: There is no impacted cerumen.     Nose: Nose normal.     Mouth/Throat:     Mouth: Mucous membranes are moist.  Eyes:     General:        Right eye: No discharge.        Left eye: No discharge.  Neck:     Vascular: No carotid bruit.  Cardiovascular:     Rate and Rhythm: Normal rate and regular rhythm.     Pulses: Normal pulses.     Heart sounds: Normal heart sounds. No murmur heard. Pulmonary:     Effort: Pulmonary effort is normal. No respiratory distress.     Breath sounds: Normal breath sounds. No wheezing.  Abdominal:     General: Bowel sounds are normal. There is no distension.     Palpations: Abdomen is soft.     Tenderness: There is no abdominal tenderness.  Musculoskeletal:     Cervical back: Normal range of motion.     Right lower leg: No edema.     Left lower  leg: No edema.  Lymphadenopathy:     Cervical: No cervical adenopathy.  Skin:    General: Skin is warm and dry.     Capillary Refill: Capillary refill takes less than 2 seconds.     Findings: Lesion present.     Comments: 4 pea sized lesions- one on forehead, nose bridge, and right forearm, no sign of infection, no drainage, wound bed pink, surrounding skin intact.   Neurological:     General: No focal deficit present.     Mental Status: She is alert. Mental status is at baseline.     Motor: Weakness present.     Gait: Gait abnormal.     Comments: wheelchair  Psychiatric:        Mood and Affect: Mood normal.        Behavior: Behavior normal.    Labs reviewed: Recent Labs    01/29/21 0000 08/21/21 0000 08/24/21 0000  NA 145 142 144  K 4.5 4.0 3.8  CL 109* 105 107  CO2 30* 27* 31*  BUN 27* 29* 26*  CREATININE 1.0 1.1 0.9  CALCIUM 8.9 9.2 9.2   Recent Labs    01/29/21 0000 08/21/21 0000 08/24/21 0000  AST 13 12* 14  ALT 17 16 16   ALKPHOS 79 74 77  ALBUMIN 3.5 4.0 4.0   Recent Labs    03/16/21 1445 08/21/21 0000 08/24/21 0000  WBC 10.1 9.2 8.9  NEUTROABS 6,090.00 6,348.00 5,180.00  HGB 11.3* 11.3* 11.7*  HCT 36 35* 37  PLT 242 222 238   Lab Results  Component Value Date   TSH 2.77 08/24/2021   Lab Results  Component Value Date   HGBA1C 5.9 08/24/2021   Lab Results  Component Value Date   CHOL 138 01/26/2021   HDL 43 01/26/2021   LDLCALC 78 01/26/2021   TRIG 83 01/26/2021   CHOLHDL 6.3 03/06/2019    Significant Diagnostic Results in last 30 days:  No results found.  Assessment/Plan 1. Skin-picking disorder - 4 pea sized lesions to face and right arm- no sign of infection - suspect increased picking due to dry weather - start Cerave lotion- apply to extremities and face daily - cont triamcinolone prn  2. Severe vascular dementia with other behavioral disturbance - no recent behavioral outbursts - cont skilled nursing care - cont Aricept  and Namenda  3. Anxiety - no panic attacks - cont zoloft   4. Primary hypertension - controlled - cont amlodipine  5. Other hyperlipidemia - cont statin  6. Gastroesophageal reflux disease, unspecified whether esophagitis present - hgb 11.7 08/21/2021 - cont Protonix  7. Prediabetes - A1c 5.9 08/24/2021  8. Anemia, chronic disease - hgb 11.7 08/21/2021, Iron 50, Vit B12 313 03/12/21      Family/ staff Communication: plan discussed with patient, husband and nurse  Labs/tests ordered:  none

## 2022-01-21 ENCOUNTER — Non-Acute Institutional Stay (SKILLED_NURSING_FACILITY): Payer: PPO | Admitting: Internal Medicine

## 2022-01-21 ENCOUNTER — Encounter: Payer: Self-pay | Admitting: Internal Medicine

## 2022-01-21 DIAGNOSIS — F01C18 Vascular dementia, severe, with other behavioral disturbance: Secondary | ICD-10-CM

## 2022-01-21 DIAGNOSIS — F419 Anxiety disorder, unspecified: Secondary | ICD-10-CM

## 2022-01-21 DIAGNOSIS — F424 Excoriation (skin-picking) disorder: Secondary | ICD-10-CM | POA: Diagnosis not present

## 2022-01-21 DIAGNOSIS — K219 Gastro-esophageal reflux disease without esophagitis: Secondary | ICD-10-CM

## 2022-01-21 DIAGNOSIS — I10 Essential (primary) hypertension: Secondary | ICD-10-CM | POA: Diagnosis not present

## 2022-01-21 DIAGNOSIS — I6302 Cerebral infarction due to thrombosis of basilar artery: Secondary | ICD-10-CM

## 2022-01-21 DIAGNOSIS — E7849 Other hyperlipidemia: Secondary | ICD-10-CM

## 2022-01-21 NOTE — Progress Notes (Signed)
Location:   [facility] Room Number: 18 Place of Service:  SNF [phone]) Provider:  Veleta Miners MD  Virgie Dad, MD  Patient Care Team: Virgie Dad, MD as PCP - General (Internal Medicine) Ngetich, Nelda Bucks, NP as Nurse Practitioner [facility] Medicine)  Extended Emergency Contact Information Primary Emergency Contact: Gordan,[place] Address: [address]          [place], [place] [address] Johnnette Litter of [place] Phone: [phone] Relation: Daughter Secondary Emergency Contact: Verneda Skill Address: [address]          [place], [place] [address] [place] of [place] Phone: [phone] Relation: Spouse  Code Status:  [facility] Care Goals of care: Advanced Directive information Advanced Directives 01/21/2022  Does Patient Have a Medical Advance Directive? Yes  Type of Paramedic of [place];Living will;Out of facility DNR (pink MOST or yellow form)  Does patient want to make changes to medical advance directive? No - Patient declined  Copy of [place] in Chart? Yes - validated most recent copy scanned in chart (See row information)  Pre-existing out of facility DNR order (yellow form or pink MOST form) Yellow form placed in chart (order not valid for inpatient use)     Chief Complaint  Patient presents with   Medical Management of Chronic Issues   Quality Metric Gaps    #5 Covid vaccine Eye exam NCIR/Matrix verified    HPI:  Pt is a 88 y.o. female seen today for medical management of chronic diseases.    Patient has history of dementia, anxiety, With Skin Picking  hypertension and hyperlipidemia  History of  Acute CVA  MRI showed Acute infarction on Left Para median Pons     She is stable. No new Nursing issues. No Behavior issues Alerts Follows commands Her weight is stable Mostly Wheelchair dependent No Falls Wt Readings from Last 3 Encounters:  01/21/22 152 lb 6.4 oz (69.1 kg)   12/23/21 151 lb 8 oz (68.7 kg)  11/17/21 151 lb 6.4 oz (68.7 kg)   Past Medical History:  Diagnosis Date   Allergic rhinitis    Anxiety    Balance problem 04/28/2015   Fatigue 04/28/2015   Hearing loss 04/28/2015   history of Fracture of right humerus 04/28/2015   HLD (hyperlipidemia) 03/05/2019   Hyperglycemia    Hyperlipidemia    Hypertension    Memory loss 04/28/2015   04/26/2014 MMSE 28/30. Failed clock drawing. 04/21/15 MMSE 21/30. Failed clock drawing.    Osteoporosis    Past Surgical History:  Procedure Laterality Date   ABDOMINAL HYSTERECTOMY     CATARACT EXTRACTION  2010   ELBOW SURGERY Right 1988   FEMUR FRACTURE SURGERY Right 06/2014   [place], [place] Dr. Dorita Fray   HUMERUS FRACTURE SURGERY Right 2015   [place], [place] Dr. Noemi Chapel   TONSILLECTOMY  [phone]    Allergies  Allergen Reactions   Ace Inhibitors Other (See Comments)    Unknown reaction   Almond Oil Rash   Plum Pulp Rash    Allergies as of 01/21/2022       Reactions   Ace Inhibitors Other (See Comments)   Unknown reaction   Almond Oil Rash   Plum Pulp Rash        Medication List        Accurate as of January 21, 2022 10:50 AM. If you have any questions, ask your nurse or doctor.  amLODipine 5 MG tablet Commonly known as: NORVASC Take 5 mg by mouth daily.   atorvastatin 40 MG tablet Commonly known as: LIPITOR Take 1 tablet (40 mg total) by mouth daily at 6 PM.   B12-Active 1 MG Chew Generic drug: Methylcobalamin Chew 1 tablet by mouth daily.   calcium carbonate 1500 (600 Ca) MG Tabs tablet Commonly known as: OSCAL Take 600 mg of elemental calcium by mouth daily with breakfast.   CeraVe Crea Apply topically daily.   clopidogrel 75 MG tablet Commonly known as: PLAVIX Take 1 tablet (75 mg total) by mouth daily.   donepezil 10 MG tablet Commonly known as: ARICEPT TAKE ONE TABLET BY MOUTH DAILY TO PRESERVE MEMORY   Fish Oil 500 MG Caps Take 1 capsule by mouth  daily.   memantine 10 MG tablet Commonly known as: Namenda Take 1 tablet (10 mg total) by mouth 2 (two) times daily.   pantoprazole 40 MG tablet Commonly known as: Protonix Take 1 tablet (40 mg total) by mouth daily.   sertraline 25 MG tablet Commonly known as: ZOLOFT Take 25 mg by mouth daily. Take three tablets =75 mg daily   triamcinolone cream 0.1 % Commonly known as: KENALOG Apply 1 application topically 2 (two) times daily.   Vitamin D3 25 MCG (1000 UT) Caps Take 1,000 Units daily by mouth.   zinc oxide 20 % ointment Apply 1 application topically as needed for irritation. Apply to peri/buttocks after every incontinent episode        Review of Systems  Unable to perform ROS: Dementia   Immunization History  Administered Date(s) Administered   DTaP 05/12/2014   Influenza, High Dose Seasonal PF 09/21/2017, 09/27/2019, 09/16/2020   Influenza,inj,Quad PF,6+ Mos 09/14/2018   Influenza-Unspecified 10/07/2014, 09/11/2015, 09/23/2016, 10/07/2021   Moderna SARS-COV2 Booster Vaccination 05/20/2021   Moderna Sars-Covid-2 Vaccination 12/17/2019, 01/14/2020, 10/27/2020   PPD Test 10/16/2014   Pneumococcal Conjugate-13 10/20/2017   Pneumococcal Polysaccharide-23 08/20/2011   Pertinent  Health Maintenance Due  Topic Date Due   OPHTHALMOLOGY EXAM  Never done   DEXA SCAN  06/08/2022 (Originally 03/21/1993)   HEMOGLOBIN A1C  02/21/2022   FOOT EXAM  05/04/2022   INFLUENZA VACCINE  Completed   URINE MICROALBUMIN  Discontinued   Fall Risk 10/14/2017 11/21/2018 03/05/2019 03/06/2019 07/01/2021  Falls in the past year? No 0 - - 1  Was there an injury with Fall? - 0 - - 0  Fall Risk Category Calculator - 0 - - 1  Fall Risk Category - Low - - Low  Patient Fall Risk Level - Low fall risk High fall risk High fall risk High fall risk  Patient at Risk for Falls Due to - - - - History of fall(s);Impaired balance/gait;Impaired mobility  Fall risk Follow up - - - - Falls evaluation  completed;Education provided;Falls prevention discussed   Functional Status Survey:    Vitals:   01/21/22 1044  BP: (!) 143/77  Pulse: 67  Resp: 18  Temp: 97.8 F (36.6 C)  SpO2: 95%  Weight: 152 lb 6.4 oz (69.1 kg)  Height: 5\' 2"  (1.575 m)   Body mass index is 27.87 kg/m. Physical Exam Vitals reviewed.  Constitutional:      Appearance: Normal appearance.  HENT:     Head: Normocephalic.     Nose: Nose normal.     Mouth/Throat:     Mouth: Mucous membranes are moist.     Pharynx: Oropharynx is clear.  Eyes:     Pupils: Pupils are  equal, round, and reactive to light.  Cardiovascular:     Rate and Rhythm: Normal rate and regular rhythm.     Pulses: Normal pulses.     Heart sounds: Normal heart sounds. No murmur heard. Pulmonary:     Effort: Pulmonary effort is normal.     Breath sounds: Normal breath sounds.  Abdominal:     General: Abdomen is flat. Bowel sounds are normal.     Palpations: Abdomen is soft.  Musculoskeletal:        General: No swelling.     Cervical back: Neck supple.  Skin:    General: Skin is warm.  Neurological:     General: No focal deficit present.     Mental Status: She is alert.  Psychiatric:        Mood and Affect: Mood normal.        Thought Content: Thought content normal.    Labs reviewed: Recent Labs    01/29/21 0000 08/21/21 0000 08/24/21 0000  NA 145 142 144  K 4.5 4.0 3.8  CL 109* 105 107  CO2 30* 27* 31*  BUN 27* 29* 26*  CREATININE 1.0 1.1 0.9  CALCIUM 8.9 9.2 9.2   Recent Labs    01/29/21 0000 08/21/21 0000 08/24/21 0000  AST 13 12* 14  ALT 17 16 16   ALKPHOS 79 74 77  ALBUMIN 3.5 4.0 4.0   Recent Labs    03/16/21 1445 08/21/21 0000 08/24/21 0000  WBC 10.1 9.2 8.9  NEUTROABS 6,090.00 6,348.00 5,180.00  HGB 11.3* 11.3* 11.7*  HCT 36 35* 37  PLT 242 222 238   Lab Results  Component Value Date   TSH 2.77 08/24/2021   Lab Results  Component Value Date   HGBA1C 5.9 08/24/2021   Lab Results   Component Value Date   CHOL 138 01/26/2021   HDL 43 01/26/2021   LDLCALC 78 01/26/2021   TRIG 83 01/26/2021   CHOLHDL 6.3 03/06/2019    Significant Diagnostic Results in last 30 days:  No results found.  Assessment/Plan 1. Skin-picking disorder Stable on Zoloft Still picks but better  2. Severe vascular dementia with other behavioral disturbance On Aricpet Namenda Does well  3. Anxiety On Zoloft No GDR  4. Primary hypertension On NOrvasc  5. Other hyperlipidemia Continue Statin  6. Gastroesophageal reflux disease, unspecified whether esophagitis present Continue on Protonix  7. Cerebrovascular accident (CVA) due to thrombosis of basilar artery (HCC) Plavix and statin 8 Anemia HGB stable  Family/ staff Communication:   Labs/tests ordered:

## 2022-02-16 ENCOUNTER — Non-Acute Institutional Stay (SKILLED_NURSING_FACILITY): Payer: PPO | Admitting: Nurse Practitioner

## 2022-02-16 ENCOUNTER — Encounter: Payer: Self-pay | Admitting: Nurse Practitioner

## 2022-02-16 DIAGNOSIS — F418 Other specified anxiety disorders: Secondary | ICD-10-CM

## 2022-02-16 DIAGNOSIS — F01C18 Vascular dementia, severe, with other behavioral disturbance: Secondary | ICD-10-CM

## 2022-02-16 DIAGNOSIS — K219 Gastro-esophageal reflux disease without esophagitis: Secondary | ICD-10-CM

## 2022-02-16 DIAGNOSIS — D638 Anemia in other chronic diseases classified elsewhere: Secondary | ICD-10-CM

## 2022-02-16 DIAGNOSIS — R269 Unspecified abnormalities of gait and mobility: Secondary | ICD-10-CM

## 2022-02-16 DIAGNOSIS — I6302 Cerebral infarction due to thrombosis of basilar artery: Secondary | ICD-10-CM

## 2022-02-16 DIAGNOSIS — I10 Essential (primary) hypertension: Secondary | ICD-10-CM

## 2022-02-16 DIAGNOSIS — R7303 Prediabetes: Secondary | ICD-10-CM

## 2022-02-16 NOTE — Assessment & Plan Note (Signed)
stable, on Pantoprazole, Hgb 11.7 08/24/21 

## 2022-02-16 NOTE — Assessment & Plan Note (Signed)
skin picking habit, sable, taking Sertraline ?

## 2022-02-16 NOTE — Assessment & Plan Note (Signed)
thrombosis of basilar artery, takes Plavix, statin, slightly RLE weakness.  

## 2022-02-16 NOTE — Assessment & Plan Note (Signed)
takes Vit B12, Vit B12 313 03/12/21, Hgb 11.7 08/24/21 ?

## 2022-02-16 NOTE — Assessment & Plan Note (Signed)
uses w/c for mobility, transfer with supervision/assistance.  

## 2022-02-16 NOTE — Assessment & Plan Note (Signed)
No behavioral issues,  an resident of SNF [facility], takes Menmantine, Donepezil for memory, TSH 2.77 08/24/21 ?

## 2022-02-16 NOTE — Progress Notes (Signed)
Location:   S[facility] Room Number: 18 Place of Service:  SNF (31) Provider: [facility] Shalev Helminiak NP  [name], MD  Patient Care Team: [name], MD as PCP - General (Internal Medicine) Ngetich, Donalee Citrin, NP as Nurse Practitioner [facility] Medicine)  Extended Emergency Contact Information Primary Emergency Contact: Gordan,[place] Address: [address]          [place], [place] [address] Darden Amber of [place] Home Phone: [phone] Relation: Daughter Secondary Emergency Contact: Iven Finn Address: [address]          [place], [place] [address] [place] of [place] Home Phone: [phone] Relation: Spouse  Code Status:  DNR Goals of care: Advanced Directive information Advanced Directives 02/16/2022  Does Patient Have a Medical Advance Directive? Yes  Type of Estate agent of [place];Living will;Out of facility DNR (pink MOST or yellow form)  Does patient want to make changes to medical advance directive? No - Patient declined  Copy of Healthcare Power of Attorney in Chart? Yes - validated most recent copy scanned in chart (See row information)  Pre-existing out of facility DNR order (yellow form or pink MOST form) Yellow form placed in chart (order not valid for inpatient use)     Chief Complaint  Patient presents with   Medical Management of Chronic Issues    HPI:  Pt is a 88 y.o. female seen today for medical management of chronic diseases.      GERD, stable, on Pantoprazole, Hgb 11.7 08/24/21             HTN, takes  amlodipine, Atorvastatin, Bun/creat 26/0.9 08/24/21             Dementia, an resident of SNF [facility], takes Menmantine, Donepezil for memory, TSH 2.77 08/24/21             Depression, skin picking habit, sable, taking Sertraline             Hx of CVA, thrombosis of basilar artery, takes Plavix, statin, slightly RLE weakness.              Gait abnormality, uses w/c for mobility, transfer with supervision/assistance.               Prediabetes, Hgb a1c 5.9 08/24/21, diet management.              Anemia, takes Vit B12, Vit B12 313 03/12/21, Hgb 11.7 08/24/21    Past Medical History:  Diagnosis Date   Allergic rhinitis    Anxiety    Balance problem 04/28/2015   Fatigue 04/28/2015   Hearing loss 04/28/2015   history of Fracture of right humerus 04/28/2015   HLD (hyperlipidemia) 03/05/2019   Hyperglycemia    Hyperlipidemia    Hypertension    Memory loss 04/28/2015   04/26/2014 MMSE 28/30. Failed clock drawing. 04/21/15 MMSE 21/30. Failed clock drawing.    Osteoporosis    Past Surgical History:  Procedure Laterality Date   ABDOMINAL HYSTERECTOMY     CATARACT EXTRACTION  2010   ELBOW SURGERY Right 1988   FEMUR FRACTURE SURGERY Right 06/2014   [place], [place] Dr. Dione Housekeeper   HUMERUS FRACTURE SURGERY Right 2015   [place], [place] Dr. Eber Hong   TONSILLECTOMY  [phone]    Allergies  Allergen Reactions   Ace Inhibitors Other (See Comments)    Unknown reaction   Almond Oil Rash   Plum Pulp Rash    Allergies as of 02/16/2022  Reactions   Ace Inhibitors Other (See Comments)   Unknown reaction   Almond Oil Rash   Plum Pulp Rash        Medication List        Accurate as of February 16, 2022 11:59 PM. If you have any questions, ask your nurse or doctor.          amLODipine 5 MG tablet Commonly known as: NORVASC Take 5 mg by mouth daily.   atorvastatin 40 MG tablet Commonly known as: LIPITOR Take 1 tablet (40 mg total) by mouth daily at 6 PM.   B12-Active 1 MG Chew Generic drug: Methylcobalamin Chew 1 tablet by mouth daily.   calcium carbonate 1500 (600 Ca) MG Tabs tablet Commonly known as: OSCAL Take 600 mg of elemental calcium by mouth daily with breakfast.   CeraVe Crea Apply topically daily.   clopidogrel 75 MG tablet Commonly known as: PLAVIX Take 1 tablet (75 mg total) by mouth daily.   donepezil 10 MG tablet Commonly known as: ARICEPT TAKE ONE TABLET BY MOUTH DAILY TO  PRESERVE MEMORY   Fish Oil 500 MG Caps Take 1 capsule by mouth daily.   memantine 10 MG tablet Commonly known as: Namenda Take 1 tablet (10 mg total) by mouth 2 (two) times daily.   pantoprazole 40 MG tablet Commonly known as: Protonix Take 1 tablet (40 mg total) by mouth daily.   sertraline 25 MG tablet Commonly known as: ZOLOFT Take 25 mg by mouth daily. Take three tablets =75 mg daily   triamcinolone cream 0.1 % Commonly known as: KENALOG Apply 1 application topically 2 (two) times daily.   Vitamin D3 25 MCG (1000 UT) Caps Take 1,000 Units daily by mouth.   zinc oxide 20 % ointment Apply 1 application topically as needed for irritation. Apply to peri/buttocks after every incontinent episode        Review of Systems  Unable to perform ROS: Dementia   Immunization History  Administered Date(s) Administered   DTaP 05/12/2014   Influenza, High Dose Seasonal PF 09/21/2017, 09/27/2019, 09/16/2020   Influenza,inj,Quad PF,6+ Mos 09/14/2018   Influenza-Unspecified 10/07/2014, 09/11/2015, 09/23/2016, 10/07/2021   Moderna SARS-COV2 Booster Vaccination 05/20/2021   Moderna Sars-Covid-2 Vaccination 12/17/2019, 01/14/2020, 10/27/2020   PPD Test 10/16/2014   Pneumococcal Conjugate-13 10/20/2017   Pneumococcal Polysaccharide-23 08/20/2011   Pertinent  Health Maintenance Due  Topic Date Due   OPHTHALMOLOGY EXAM  Never done   DEXA SCAN  06/08/2022 (Originally 03/21/1993)   HEMOGLOBIN A1C  02/21/2022   FOOT EXAM  05/04/2022   INFLUENZA VACCINE  Completed   URINE MICROALBUMIN  Discontinued   Fall Risk 10/14/2017 11/21/2018 03/05/2019 03/06/2019 07/01/2021  Falls in the past year? No 0 - - 1  Was there an injury with Fall? - 0 - - 0  Fall Risk Category Calculator - 0 - - 1  Fall Risk Category - Low - - Low  Patient Fall Risk Level - Low fall risk High fall risk High fall risk High fall risk  Patient at Risk for Falls Due to - - - - History of fall(s);Impaired  balance/gait;Impaired mobility  Fall risk Follow up - - - - Falls evaluation completed;Education provided;Falls prevention discussed   Functional Status Survey:    Vitals:   02/16/22 1055  BP: (!) 141/78  Pulse: 61  Resp: 18  Temp: (!) 97.5 F (36.4 C)  SpO2: 97%   There is no height or weight on file to calculate BMI. Physical Exam Vitals  and nursing note reviewed.  Constitutional:      Appearance: Normal appearance.  HENT:     Head: Normocephalic and atraumatic.     Mouth/Throat:     Mouth: Mucous membranes are moist.  Eyes:     Extraocular Movements: Extraocular movements intact.     Conjunctiva/sclera: Conjunctivae normal.     Pupils: Pupils are equal, round, and reactive to light.  Cardiovascular:     Rate and Rhythm: Normal rate and regular rhythm.     Heart sounds: No murmur heard. Pulmonary:     Breath sounds: No rales.  Abdominal:     General: Bowel sounds are normal.     Palpations: Abdomen is soft.     Tenderness: There is no abdominal tenderness.  Musculoskeletal:     Cervical back: Normal range of motion and neck supple.     Right lower leg: No edema.     Left lower leg: No edema.  Skin:    General: Skin is warm and dry.     Comments: Scratched injuries are healed.    Neurological:     General: No focal deficit present.     Mental Status: She is alert. Mental status is at baseline.     Gait: Gait abnormal.     Comments: Oriented to self. Slightly weakness in RLE  Psychiatric:        Mood and Affect: Mood normal.        Behavior: Behavior normal.    Labs reviewed: Recent Labs    08/21/21 0000 08/24/21 0000  NA 142 144  K 4.0 3.8  CL 105 107  CO2 27* 31*  BUN 29* 26*  CREATININE 1.1 0.9  CALCIUM 9.2 9.2   Recent Labs    08/21/21 0000 08/24/21 0000  AST 12* 14  ALT 16 16  ALKPHOS 74 77  ALBUMIN 4.0 4.0   Recent Labs    03/16/21 1445 08/21/21 0000 08/24/21 0000  WBC 10.1 9.2 8.9  NEUTROABS 6,090.00 6,348.00 5,180.00  HGB  11.3* 11.3* 11.7*  HCT 36 35* 37  PLT 242 222 238   Lab Results  Component Value Date   TSH 2.77 08/24/2021   Lab Results  Component Value Date   HGBA1C 5.9 08/24/2021   Lab Results  Component Value Date   CHOL 138 01/26/2021   HDL 43 01/26/2021   LDLCALC 78 01/26/2021   TRIG 83 01/26/2021   CHOLHDL 6.3 03/06/2019    Significant Diagnostic Results in last 30 days:  No results found.  Assessment/Plan Vascular dementia (HCC) No behavioral issues,  an resident of SNF [facility], takes Menmantine, Donepezil for memory, TSH 2.77 08/24/21  Depression with anxiety skin picking habit, sable, taking Sertraline  Stroke (cerebrum) (HCC) thrombosis of basilar artery, takes Plavix, statin, slightly RLE weakness.  Gait abnormality uses w/c for mobility, transfer with supervision/assistance.   Prediabetes Hgb a1c 5.9 08/24/21, diet management.   Anemia, chronic disease takes Vit B12, Vit B12 313 03/12/21, Hgb 11.7 08/24/21  Hypertension Blood pressure is controlled, , takes  amlodipine, Atorvastatin, Bun/creat 26/0.9 08/24/21  GERD (gastroesophageal reflux disease) stable, on Pantoprazole, Hgb 11.7 08/24/21   Family/ staff Communication: plan of care reviewed with the patient and charge nurse.   Labs/tests ordered:  none  Time spend 35 minutes.

## 2022-02-16 NOTE — Assessment & Plan Note (Signed)
Blood pressure is controlled, , takes  amlodipine, Atorvastatin, Bun/creat 26/0.9 08/24/21 ?

## 2022-02-16 NOTE — Progress Notes (Signed)
?Location:   [facility]  ?Nursing Home Room Number: 18 ?Place of Service:  SNF (31) ?Provider:  Chipper Oman NP ? ?[name], MD ? ?Patient Care Team: ?[name], MD as PCP - General (Internal Medicine) ?Ngetich, Donalee Citrin, NP as Nurse Practitioner (Family Medicine) ? ?Extended Emergency Contact Information ?Primary Emergency Contact: Gordan,Virginia ?Address: [address] ?         [place], [place] [address] [place] ?Home Phone: [phone] ?Relation: Daughter ?Secondary Emergency Contact: Gapinski,Donald ?Address: [address] ?         [place], [place] [address] [place] ?Home Phone: [phone] ?Relation: Spouse ? ?Code Status:  DNR Managed Care ?Goals of care: Advanced Directive information ?Advanced Directives 02/16/2022  ?Does Patient Have a Medical Advance Directive? Yes  ?Type of Estate agent of [place];Living will;Out of facility DNR (pink MOST or yellow form)  ?Does patient want to make changes to medical advance directive? No - Patient declined  ?Copy of Healthcare Power of Attorney in Chart? Yes - validated most recent copy scanned in chart (See row information)  ?Pre-existing out of facility DNR order (yellow form or pink MOST form) Yellow form placed in chart (order not valid for inpatient use)  ? ? ? ?Chief Complaint  ?Patient presents with  ? Medical Management of Chronic Issues  ? Quality Metric Gaps  ?  #5 covid-19 vaccine  ? ? ?HPI:  ?Pt is a 88 y.o. [name] seen today for medical management of chronic diseases.   ? ? ?Past Medical History:  ?Diagnosis Date  ? Allergic rhinitis   ? Anxiety   ? Balance problem 04/28/2015  ? Fatigue 04/28/2015  ? Hearing loss 04/28/2015  ? history of Fracture of right humerus 04/28/2015  ? HLD (hyperlipidemia) 03/05/2019  ? Hyperglycemia   ? Hyperlipidemia   ? Hypertension   ? Memory loss 04/28/2015  ? 04/26/2014 MMSE 28/30. Failed clock drawing. 04/21/15 MMSE 21/30. Failed clock drawing.   ?  Osteoporosis   ? ?Past Surgical History:  ?Procedure Laterality Date  ? ABDOMINAL HYSTERECTOMY    ? CATARACT EXTRACTION  2010  ? ELBOW SURGERY Right 1988  ? FEMUR FRACTURE SURGERY Right 06/2014  ? [place] Dr. Dione Housekeeper  ? HUMERUS FRACTURE SURGERY Right 2015  ? [place] Dr. Eber Hong  ? TONSILLECTOMY  1935  ? ? ?Allergies  ?Allergen Reactions  ? Ace Inhibitors Other (See Comments)  ?  Unknown reaction  ? Almond Oil Rash  ? Plum Pulp Rash  ? ? ?Allergies as of 02/16/2022   ? ?   Reactions  ? Ace Inhibitors Other (See Comments)  ? Unknown reaction  ? Almond Oil Rash  ? Plum Pulp Rash  ? ?  ? ?  ?Medication List  ?  ? ?  ? Accurate as of February 16, 2022  3:05 PM. If you have any questions, ask your nurse or doctor.  ?  ?  ? ?  ? ?amLODipine 5 MG tablet ?Commonly known as: NORVASC ?Take 5 mg by mouth daily. ?  ?atorvastatin 40 MG tablet ?Commonly known as: LIPITOR ?Take 1 tablet (40 mg total) by mouth daily at 6 PM. ?  ?B12-Active 1 MG Chew ?Generic drug: Methylcobalamin ?Chew 1 tablet by mouth daily. ?  ?calcium carbonate 1500 (600 Ca) MG Tabs tablet ?Commonly known as: OSCAL ?Take 600 mg of elemental calcium by mouth daily with breakfast. ?  ?CeraVe Crea ?Apply topically daily. ?  ?  clopidogrel 75 MG tablet ?Commonly known as: PLAVIX ?Take 1 tablet (75 mg total) by mouth daily. ?  ?donepezil 10 MG tablet ?Commonly known as: ARICEPT ?TAKE ONE TABLET BY MOUTH DAILY TO PRESERVE MEMORY ?  ?Fish Oil 500 MG Caps ?Take 1 capsule by mouth daily. ?  ?memantine 10 MG tablet ?Commonly known as: Namenda ?Take 1 tablet (10 mg total) by mouth 2 (two) times daily. ?  ?pantoprazole 40 MG tablet ?Commonly known as: Protonix ?Take 1 tablet (40 mg total) by mouth daily. ?  ?sertraline 25 MG tablet ?Commonly known as: ZOLOFT ?Take 25 mg by mouth daily. Take three tablets =75 mg daily ?  ?triamcinolone cream 0.1 % ?Commonly known as: KENALOG ?Apply 1 application topically 2 (two) times daily. ?  ?Vitamin D3 25 MCG (1000 UT)  Caps ?Take 1,000 Units daily by mouth. ?  ?zinc oxide 20 % ointment ?Apply 1 application topically as needed for irritation. Apply to peri/buttocks after every incontinent episode ?  ? ?  ? ? ?Review of Systems ? ?Immunization History  ?Administered Date(s) Administered  ? DTaP 05/12/2014  ? Influenza, High Dose Seasonal PF 09/21/2017, 09/27/2019, 09/16/2020  ? Influenza,inj,Quad PF,6+ Mos 09/14/2018  ? Influenza-Unspecified 10/07/2014, 09/11/2015, 09/23/2016, 10/07/2021  ? Moderna SARS-COV2 Booster Vaccination 05/20/2021  ? Moderna Sars-Covid-2 Vaccination 12/17/2019, 01/14/2020, 10/27/2020  ? PPD Test 10/16/2014  ? Pneumococcal Conjugate-13 10/20/2017  ? Pneumococcal Polysaccharide-23 08/20/2011  ? ?Pertinent  Health Maintenance Due  ?Topic Date Due  ? OPHTHALMOLOGY EXAM  Never done  ? DEXA SCAN  06/08/2022 (Originally 03/21/1993)  ? HEMOGLOBIN A1C  02/21/2022  ? FOOT EXAM  05/04/2022  ? INFLUENZA VACCINE  Completed  ? URINE MICROALBUMIN  Discontinued  ? ?Fall Risk 10/14/2017 11/21/2018 03/05/2019 03/06/2019 07/01/2021  ?Falls in the past year? No 0 - - 1  ?Was there an injury with Fall? - 0 - - 0  ?Fall Risk Category Calculator - 0 - - 1  ?Fall Risk Category - Low - - Low  ?Patient Fall Risk Level - Low fall risk High fall risk High fall risk High fall risk  ?Patient at Risk for Falls Due to - - - - History of fall(s);Impaired balance/gait;Impaired mobility  ?Fall risk Follow up - - - - Falls evaluation completed;Education provided;Falls prevention discussed  ? ?Functional Status Survey: ?  ? ?Vitals:  ? 02/16/22 1500  ?BP: (!) 141/78  ?Pulse: 61  ?Resp: (!) 95  ?Temp: (!) 97.5 ?F (36.4 ?C)  ?SpO2: 97%  ?Weight: 153 lb (69.4 kg)  ?Height: 5\' 2"  (1.575 m)  ? ?Body mass index is 27.98 kg/m?Marland Kitchen ?Physical Exam ? ?Labs reviewed: ?Recent Labs  ?  08/21/21 ?0000 08/24/21 ?0000  ?NA 142 144  ?K 4.0 3.8  ?CL 105 107  ?CO2 27* 31*  ?BUN 29* 26*  ?CREATININE 1.1 0.9  ?CALCIUM 9.2 9.2  ? ?Recent Labs  ?  08/21/21 ?0000  08/24/21 ?0000  ?AST 12* 14  ?ALT 16 16  ?ALKPHOS 74 77  ?ALBUMIN 4.0 4.0  ? ?Recent Labs  ?  03/16/21 ?1445 08/21/21 ?0000 08/24/21 ?0000  ?WBC 10.1 9.2 8.9  ?NEUTROABS 6,090.00 6,348.00 5,180.00  ?HGB 11.3* 11.3* 11.7*  ?HCT 36 35* 37  ?PLT 242 222 238  ? ?Lab Results  ?Component Value Date  ? TSH 2.77 08/24/2021  ? ?Lab Results  ?Component Value Date  ? HGBA1C 5.9 08/24/2021  ? ?Lab Results  ?Component Value Date  ? CHOL 138 01/26/2021  ? HDL 43 01/26/2021  ?  LDLCALC 78 01/26/2021  ? TRIG 83 01/26/2021  ? CHOLHDL 6.3 03/06/2019  ? ? ?Significant Diagnostic Results in last 30 days:  ?No results found. ? ?Assessment/Plan ?There are no diagnoses linked to this encounter. ? ? ?Family/ staff Communication:  ? ?Labs/tests ordered:   ? ?  ?

## 2022-02-16 NOTE — Assessment & Plan Note (Signed)
Hgb a1c 5.9 08/24/21, diet management.  ?

## 2022-02-18 ENCOUNTER — Encounter: Payer: Self-pay | Admitting: Nurse Practitioner

## 2022-02-18 NOTE — Progress Notes (Signed)
This encounter was created in error - please disregard.

## 2022-03-24 ENCOUNTER — Encounter: Payer: Self-pay | Admitting: Orthopedic Surgery

## 2022-03-24 ENCOUNTER — Non-Acute Institutional Stay (SKILLED_NURSING_FACILITY): Payer: PPO | Admitting: Orthopedic Surgery

## 2022-03-24 DIAGNOSIS — F424 Excoriation (skin-picking) disorder: Secondary | ICD-10-CM | POA: Diagnosis not present

## 2022-03-24 DIAGNOSIS — F418 Other specified anxiety disorders: Secondary | ICD-10-CM

## 2022-03-24 DIAGNOSIS — F01C18 Vascular dementia, severe, with other behavioral disturbance: Secondary | ICD-10-CM

## 2022-03-24 DIAGNOSIS — E7849 Other hyperlipidemia: Secondary | ICD-10-CM

## 2022-03-24 DIAGNOSIS — D638 Anemia in other chronic diseases classified elsewhere: Secondary | ICD-10-CM

## 2022-03-24 DIAGNOSIS — I6302 Cerebral infarction due to thrombosis of basilar artery: Secondary | ICD-10-CM

## 2022-03-24 DIAGNOSIS — K219 Gastro-esophageal reflux disease without esophagitis: Secondary | ICD-10-CM

## 2022-03-24 DIAGNOSIS — R7303 Prediabetes: Secondary | ICD-10-CM

## 2022-03-24 DIAGNOSIS — I10 Essential (primary) hypertension: Secondary | ICD-10-CM | POA: Diagnosis not present
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
## 2022-03-26 LAB — LIPID PANEL
Cholesterol: 120 (ref 0–200)
HDL: 51 (ref 35–70)
LDL Cholesterol: 55
LDl/HDL Ratio: 2.4
Triglycerides: 62 (ref 40–160)

## 2022-03-26 LAB — BASIC METABOLIC PANEL
BUN: 26 — AB (ref 4–21)
CO2: 32 — AB (ref 13–22)
Chloride: 107 (ref 99–108)
Creatinine: 1 (ref 0.5–1.1)
Glucose: 86
Potassium: 4.1 mEq/L (ref 3.5–5.1)
Sodium: 143 (ref 137–147)

## 2022-03-26 LAB — CBC AND DIFFERENTIAL
HCT: 33 — AB (ref 36–46)
Hemoglobin: 10.6 — AB (ref 12.0–16.0)
Neutrophils Absolute: 5394
Platelets: 201 10*3/uL (ref 150–400)
WBC: 8.7

## 2022-03-26 LAB — HEPATIC FUNCTION PANEL
ALT: 22 U/L (ref 7–35)
AST: 17 (ref 13–35)
Alkaline Phosphatase: 67 (ref 25–125)
Bilirubin, Total: 0.4

## 2022-03-26 LAB — VITAMIN B12: Vitamin B-12: 1185

## 2022-03-26 LAB — HEMOGLOBIN A1C: Hemoglobin A1C: 5.9

## 2022-03-26 LAB — COMPREHENSIVE METABOLIC PANEL
Albumin: 3.8 (ref 3.5–5.0)
Calcium: 8.9 (ref 8.7–10.7)
Globulin: 2
eGFR: 51

## 2022-03-26 LAB — CBC: RBC: 3.5 — AB (ref 3.87–5.11)

## 2022-04-15 ENCOUNTER — Non-Acute Institutional Stay (SKILLED_NURSING_FACILITY): Payer: PPO | Admitting: Internal Medicine

## 2022-04-15 ENCOUNTER — Encounter: Payer: Self-pay | Admitting: Internal Medicine

## 2022-04-15 DIAGNOSIS — F01C18 Vascular dementia, severe, with other behavioral disturbance: Secondary | ICD-10-CM | POA: Diagnosis not present

## 2022-04-15 DIAGNOSIS — I6302 Cerebral infarction due to thrombosis of basilar artery: Secondary | ICD-10-CM

## 2022-04-15 DIAGNOSIS — R7303 Prediabetes: Secondary | ICD-10-CM

## 2022-04-15 DIAGNOSIS — I10 Essential (primary) hypertension: Secondary | ICD-10-CM | POA: Diagnosis not present

## 2022-04-15 DIAGNOSIS — F424 Excoriation (skin-picking) disorder: Secondary | ICD-10-CM | POA: Diagnosis not present

## 2022-04-15 DIAGNOSIS — K219 Gastro-esophageal reflux disease without esophagitis: Secondary | ICD-10-CM

## 2022-04-15 DIAGNOSIS — F418 Other specified anxiety disorders: Secondary | ICD-10-CM

## 2022-04-15 DIAGNOSIS — D638 Anemia in other chronic diseases classified elsewhere: Secondary | ICD-10-CM

## 2022-04-15 DIAGNOSIS — E7849 Other hyperlipidemia: Secondary | ICD-10-CM

## 2022-04-15 NOTE — Progress Notes (Signed)
?Location:   [facility] ?Nursing Home Room Number: 18 A ?Place of Service:  SNF (31) ?Provider:  Einar Crow, MD ? ?[name], MD ? ?Patient Care Team: ?[name], MD as PCP - General (Internal Medicine) ?Ngetich, Donalee Citrin, NP as Nurse Practitioner (Family Medicine) ? ?Extended Emergency Contact Information ?Primary Emergency Contact: Gordan,Virginia ?Address: [address] ?         [place], [place] [address] [place] ?Home Phone: [phone] ?Relation: Daughter ?Secondary Emergency Contact: Luce,Donald ?Address: [address] ?         [place], [place] [address] [place] ?Home Phone: [phone] ?Relation: Spouse ? ?Code Status:  DNR ?Goals of care: Advanced Directive information ? ?  04/15/2022  ?  9:42 AM  ?Advanced Directives  ?Does Patient Have a Medical Advance Directive? Yes  ?Type of Estate agent of [place];Living will;Out of facility DNR (pink MOST or yellow form)  ?Does patient want to make changes to medical advance directive? No - Patient declined  ?Copy of Healthcare Power of Attorney in Chart? Yes - validated most recent copy scanned in chart (See row information)  ?Pre-existing out of facility DNR order (yellow form or pink MOST form) Yellow form placed in chart (order not valid for inpatient use)  ? ? ? ?Chief Complaint  ?Patient presents with  ? Medical Management of Chronic Issues  ?  Routine follow up  ? Immunizations  ?  3rd COVID booster  ? Quality Metric Gaps  ?  Eye exam, Hemoglobin A1C  ? ? ?HPI:  ?Pt is a 88 y.o. female seen today for medical management of chronic diseases.   ? ?Patient has history of dementia, anxiety, With Skin Picking ? hypertension and hyperlipidemia ? History of  Acute CVA  MRI showed Acute infarction on Left Para median Pons  ? ? ?  ? ?She is stable. No new Nursing issues. No Behavior issues ?Her weight is stable ?Does not walk  ?Stays in her wheelchair ?No Falls ?Wt Readings from Last 3 Encounters:   ?04/15/22 154 lb 14.4 oz (70.3 kg)  ?03/24/22 154 lb 14.4 oz (70.3 kg)  ?02/16/22 153 lb (69.4 kg)  ? ?Past Medical History:  ?Diagnosis Date  ? Allergic rhinitis   ? Anxiety   ? Balance problem 04/28/2015  ? Fatigue 04/28/2015  ? Hearing loss 04/28/2015  ? history of Fracture of right humerus 04/28/2015  ? HLD (hyperlipidemia) 03/05/2019  ? Hyperglycemia   ? Hyperlipidemia   ? Hypertension   ? Memory loss 04/28/2015  ? 04/26/2014 MMSE 28/30. Failed clock drawing. 04/21/15 MMSE 21/30. Failed clock drawing.   ? Osteoporosis   ? ?Past Surgical History:  ?Procedure Laterality Date  ? ABDOMINAL HYSTERECTOMY    ? CATARACT EXTRACTION  2010  ? ELBOW SURGERY Right 1988  ? FEMUR FRACTURE SURGERY Right 06/2014  ? [place] Dione Housekeeper  ? HUMERUS FRACTURE SURGERY Right 2015  ? [place] Dr. Eber Hong  ? TONSILLECTOMY  1935  ? ? ?Allergies  ?Allergen Reactions  ? Ace Inhibitors Other (See Comments)  ?  Unknown reaction  ? Almond Oil Rash  ? Plum Pulp Rash  ? ? ?Allergies as of 04/15/2022   ? ?   Reactions  ? Ace Inhibitors Other (See Comments)  ? Unknown reaction  ? Almond Oil Rash  ? Plum Pulp Rash  ? ?  ? ?  ?Medication List  ?  ? ?  ? Accurate  as of Apr 15, 2022 11:59 PM. If you have any questions, ask your nurse or doctor.  ?  ?  ? ?  ? ?amLODipine 5 MG tablet ?Commonly known as: NORVASC ?Take 5 mg by mouth daily. ?  ?atorvastatin 40 MG tablet ?Commonly known as: LIPITOR ?Take 1 tablet (40 mg total) by mouth daily at 6 PM. ?  ?B12-Active 1 MG Chew ?Generic drug: Methylcobalamin ?Chew 1 tablet by mouth daily. ?  ?calcium carbonate 1500 (600 Ca) MG Tabs tablet ?Commonly known as: OSCAL ?Take 600 mg of elemental calcium by mouth daily with breakfast. ?  ?CeraVe Crea ?Apply topically daily. ?  ?clopidogrel 75 MG tablet ?Commonly known as: PLAVIX ?Take 1 tablet (75 mg total) by mouth daily. ?  ?donepezil 10 MG tablet ?Commonly known as: ARICEPT ?TAKE ONE TABLET BY MOUTH DAILY TO PRESERVE MEMORY ?  ?Fish Oil 500 MG  Caps ?Take 1 capsule by mouth daily. ?  ?memantine 10 MG tablet ?Commonly known as: Namenda ?Take 1 tablet (10 mg total) by mouth 2 (two) times daily. ?  ?pantoprazole 40 MG tablet ?Commonly known as: Protonix ?Take 1 tablet (40 mg total) by mouth daily. ?  ?sertraline 25 MG tablet ?Commonly known as: ZOLOFT ?Take 25 mg by mouth daily. Take three tablets =75 mg daily ?  ?triamcinolone cream 0.1 % ?Commonly known as: KENALOG ?Apply 1 application. topically 2 (two) times daily as needed (For red area on left arm). ?  ?Vitamin D3 25 MCG (1000 UT) Caps ?Take 1,000 Units daily by mouth. ?  ?zinc oxide 20 % ointment ?Apply 1 application topically as needed for irritation. Apply to peri/buttocks after every incontinent episode ?  ? ?  ? ? ?Review of Systems  ?Unable to perform ROS: Dementia  ? ?Immunization History  ?Administered Date(s) Administered  ? DTaP 05/12/2014  ? Influenza, High Dose Seasonal PF 09/21/2017, 09/27/2019, 09/16/2020  ? Influenza,inj,Quad PF,6+ Mos 09/14/2018  ? Influenza-Unspecified 10/07/2014, 09/11/2015, 09/23/2016, 10/07/2021  ? Moderna Sars-Covid-2 Vaccination 12/17/2019, 01/14/2020, 10/27/2020, 05/20/2021  ? PPD Test 10/16/2014  ? Pneumococcal Conjugate-13 10/20/2017  ? Pneumococcal Polysaccharide-23 08/20/2011  ? ?Pertinent  Health Maintenance Due  ?Topic Date Due  ? OPHTHALMOLOGY EXAM  Never done  ? DEXA SCAN  06/08/2022 (Originally 03/21/1993)  ? FOOT EXAM  05/04/2022  ? INFLUENZA VACCINE  07/13/2022  ? HEMOGLOBIN A1C  09/25/2022  ? URINE MICROALBUMIN  Discontinued  ? ? ?  10/14/2017  ?  1:51 PM 11/21/2018  ?  1:35 PM 03/05/2019  ?  8:24 PM 03/06/2019  ?  8:00 PM 07/01/2021  ?  4:32 PM  ?Fall Risk  ?Falls in the past year? No 0   1  ?Was there an injury with Fall?  0   0  ?Fall Risk Category Calculator  0   1  ?Fall Risk Category  Low   Low  ?Patient Fall Risk Level  Low fall risk High fall risk High fall risk High fall risk  ?Patient at Risk for Falls Due to     History of fall(s);Impaired  balance/gait;Impaired mobility  ?Fall risk Follow up     Falls evaluation completed;Education provided;Falls prevention discussed  ? ?Functional Status Survey: ?  ? ?Vitals:  ? 04/15/22 0928  ?BP: (!) 145/83  ?Pulse: 76  ?Resp: 16  ?Temp: (!) 97.3 ?F (36.3 ?C)  ?SpO2: 94%  ?Weight: 154 lb 14.4 oz (70.3 kg)  ?Height: 5\' 2"  (1.575 m)  ? ?Body mass index is 28.33 kg/m? ?Physical Exam ?  Vitals reviewed.  ?Constitutional:   ?   Appearance: Normal appearance.  ?HENT:  ?   Head: Normocephalic.  ?   Nose: Nose normal.  ?   Mouth/Throat:  ?   Mouth: Mucous membranes are moist.  ?   Pharynx: Oropharynx is clear.  ?Eyes:  ?   Pupils: Pupils are equal, round, and reactive to light.  ?Cardiovascular:  ?   Rate and Rhythm: Normal rate and regular rhythm.  ?   Pulses: Normal pulses.  ?   Heart sounds: Normal heart sounds. No murmur heard. ?Pulmonary:  ?   Effort: Pulmonary effort is normal.  ?   Breath sounds: Normal breath sounds.  ?Abdominal:  ?   General: Abdomen is flat. Bowel sounds are normal.  ?   Palpations: Abdomen is soft.  ?Musculoskeletal:     ?   General: No swelling.  ?   Cervical back: Neck supple.  ?Skin: ?   General: Skin is warm.  ?   Comments: No areas of picking  ?Neurological:  ?   General: No focal deficit present.  ?   Mental Status: She is alert.  ?Psychiatric:     ?   Mood and Affect: Mood normal.     ?   Thought Content: Thought content normal.  ? ? ?Labs reviewed: ?Recent Labs  ?  08/21/21 ?0000 08/24/21 ?0000 03/26/22 ?0000  ?NA 142 144 143  ?K 4.0 3.8 4.1  ?CL 105 107 107  ?CO2 27* 31* 32*  ?BUN 29* 26* 26*  ?CREATININE 1.1 0.9 1.0  ?CALCIUM 9.2 9.2 8.9  ? ?Recent Labs  ?  08/21/21 ?0000 08/24/21 ?0000 03/26/22 ?0000  ?AST 12* 14 17  ?ALT 16 16 22   ?ALKPHOS 74 77 67  ?ALBUMIN 4.0 4.0 3.8  ? ?Recent Labs  ?  08/21/21 ?0000 08/24/21 ?0000 03/26/22 ?0000  ?WBC 9.2 8.9 8.7  ?NEUTROABS 6,348.00 5,180.00 5,394.00  ?HGB 11.3* 11.7* 10.6*  ?HCT 35* 37 33*  ?PLT 222 238 201  ? ?Lab Results  ?Component Value  Date  ? TSH 2.77 08/24/2021  ? ?Lab Results  ?Component Value Date  ? HGBA1C 5.9 03/26/2022  ? ?Lab Results  ?Component Value Date  ? CHOL 120 03/26/2022  ? HDL 51 03/26/2022  ? LDLCALC 55 03/26/2022  ? TRIG 62 04/14/

## 2022-05-03 LAB — CBC AND DIFFERENTIAL
HCT: 35 — AB (ref 36–46)
Hemoglobin: 11.2 — AB (ref 12.0–16.0)
Platelets: 227 10*3/uL (ref 150–400)
WBC: 8.2

## 2022-05-03 LAB — CBC: RBC: 3.67 — AB (ref 3.87–5.11)

## 2022-05-03 LAB — IRON,TIBC AND FERRITIN PANEL
%SAT: 20
Ferritin: 11
Iron: 63
TIBC: 315

## 2022-05-04 ENCOUNTER — Telehealth: Payer: Self-pay | Admitting: Internal Medicine

## 2022-05-04 NOTE — Telephone Encounter (Signed)
Labs repeated for low HGB Repeat was 11.2 Ferritin was 11 with Iron sats of 20% Will not start her on iron. Just continue to monitor for now

## 2022-05-19 ENCOUNTER — Non-Acute Institutional Stay (SKILLED_NURSING_FACILITY): Payer: PPO | Admitting: Orthopedic Surgery

## 2022-05-19 ENCOUNTER — Encounter: Payer: Self-pay | Admitting: Orthopedic Surgery

## 2022-05-19 DIAGNOSIS — I10 Essential (primary) hypertension: Secondary | ICD-10-CM | POA: Diagnosis not present

## 2022-05-19 DIAGNOSIS — D638 Anemia in other chronic diseases classified elsewhere: Secondary | ICD-10-CM

## 2022-05-19 DIAGNOSIS — F01C18 Vascular dementia, severe, with other behavioral disturbance: Secondary | ICD-10-CM | POA: Diagnosis not present

## 2022-05-19 DIAGNOSIS — L853 Xerosis cutis: Secondary | ICD-10-CM

## 2022-05-19 DIAGNOSIS — F424 Excoriation (skin-picking) disorder: Secondary | ICD-10-CM

## 2022-05-19 DIAGNOSIS — E7849 Other hyperlipidemia: Secondary | ICD-10-CM

## 2022-05-19 DIAGNOSIS — I6302 Cerebral infarction due to thrombosis of basilar artery: Secondary | ICD-10-CM

## 2022-05-19 DIAGNOSIS — R7303 Prediabetes: Secondary | ICD-10-CM

## 2022-05-19 DIAGNOSIS — F418 Other specified anxiety disorders: Secondary | ICD-10-CM | POA: Diagnosis not present

## 2022-05-19 DIAGNOSIS — K219 Gastro-esophageal reflux disease without esophagitis: Secondary | ICD-10-CM

## 2022-05-19 NOTE — Progress Notes (Signed)
Location:  [facility] Room Number: [place] of Service:  SNF [phone]) Provider:  Yvonna Alanis, NP   Virgie Dad, MD  Patient Care Team: Virgie Dad, MD as PCP - General (Internal Medicine) Ngetich, Nelda Bucks, NP as Nurse Practitioner [facility])  Extended Emergency Contact Information Primary Emergency Contact: Gordan,[place] Address: [address]          [place], [place] [address] [place]nette [place] of [place] Phone: [phone] Relation: Daughter Secondary Emergency Contact: Verneda Skill Address: [address]          [place], [place] [address] [place] Phone: [phone] Relation: Spouse  Code Status:  DNR Goals of care: Advanced Directive information    04/15/2022    9:42 AM  Advanced Directives  Does Patient Have a Medical Advance Directive? Yes  Type of Paramedic of [place];Living will;Out of facility DNR (pink MOST or yellow form)  Does patient want to make changes to medical advance directive? No - Patient declined  Copy of [place] in Chart? Yes - validated most recent copy scanned in chart (See row information)  Pre-existing out of facility DNR order (yellow form or pink MOST form) Yellow form placed in chart (order not valid for inpatient use)     Chief Complaint  Patient presents with   Medical Management of Chronic Issues    HPI:  Pt is a 88 y.o. female seen today for medical management of chronic diseases.    She currently resides on the skilled nursing unit at [facility]. Past medical conditions include: hypertension, CVA, dementia, GERD, diabetes, CKD stage 3, hyperlipidemia, depression, anxiety and abnormal gait.  Depression/Anxiety- no mood changes, admits to talking to husband when alone in room, leaves room often to play bingo, good family support, Na+ 143 03/26/2022, remains on Zoloft Dementia- CT head 02/2019 confirmed small white matter disease,  BIMS 12/15 05/2022, no recent behavioral outbursts, remains on Aricept and Namenda  Skin picking- no skin lesions at this time, remains on triamcinolone prn and Cerave  HTN- BUN/creat 26/1.0 03/26/2022, remains on amlodipine HLD- LDL 55 03/26/2022, remains on Lipitor GERD- hgb 10.6 03/26/2022, remains on Protonix Prediabetes- a1c 5.9 03/26/2022, remains on regular diet Anemia- hgb 10.6 03/26/2022, Iron 50, Vit B12 313 03/12/21 HX CVA- hx acute infarct on left Para median pons, remains on plavix and statin  No recent falls or injuries. Ambulates with wheelchair.    06/07- 137/86  06/06- 127/65, 135/78  06/05- 121/86  Recent weights:  06/01- 154.9 lbs  05/03- 154.9 lbs  04/03- 154.9 lbs     Past Medical History:  Diagnosis Date   Allergic rhinitis    Anxiety    Balance problem 04/28/2015   Fatigue 04/28/2015   Hearing loss 04/28/2015   history of Fracture of right humerus 04/28/2015   HLD (hyperlipidemia) 03/05/2019   Hyperglycemia    Hyperlipidemia    Hypertension    Memory loss 04/28/2015   04/26/2014 MMSE 28/30. Failed clock drawing. 04/21/15 MMSE 21/30. Failed clock drawing.    Osteoporosis    Past Surgical History:  Procedure Laterality Date   ABDOMINAL HYSTERECTOMY     CATARACT EXTRACTION  2010   ELBOW SURGERY Right 1988   FEMUR FRACTURE SURGERY Right 06/2014   [place], [place] Dr. Dorita Fray   HUMERUS FRACTURE SURGERY Right 2015   [place], [place] Dr. Noemi Chapel   TONSILLECTOMY  [phone]    Allergies  Allergen Reactions  Ace Inhibitors Other (See Comments)    Unknown reaction   Almond Oil Rash   Plum Pulp Rash    Outpatient Encounter Medications as of 05/19/2022  Medication Sig   amLODipine (NORVASC) 5 MG tablet Take 5 mg by mouth daily.    atorvastatin (LIPITOR) 40 MG tablet Take 1 tablet (40 mg total) by mouth daily at 6 PM.   calcium carbonate (OSCAL) 1500 (600 Ca) MG TABS tablet Take 600 mg of elemental calcium by mouth daily with breakfast.   Cholecalciferol  (VITAMIN D3) 1000 UNITS CAPS Take 1,000 Units daily by mouth.    clopidogrel (PLAVIX) 75 MG tablet Take 1 tablet (75 mg total) by mouth daily.   donepezil (ARICEPT) 10 MG tablet TAKE ONE TABLET BY MOUTH DAILY TO PRESERVE MEMORY   Emollient (CERAVE) CREA Apply topically daily.   memantine (NAMENDA) 10 MG tablet Take 1 tablet (10 mg total) by mouth 2 (two) times daily.   Methylcobalamin (B12-ACTIVE) 1 MG CHEW Chew 1 tablet by mouth daily.   Omega-3 Fatty Acids (FISH OIL) 500 MG CAPS Take 1 capsule by mouth daily.   pantoprazole (PROTONIX) 40 MG tablet Take 1 tablet (40 mg total) by mouth daily.   sertraline (ZOLOFT) 25 MG tablet Take 25 mg by mouth daily. Take three tablets =75 mg daily   triamcinolone cream (KENALOG) 0.1 % Apply 1 application. topically 2 (two) times daily as needed (For red area on left arm).   zinc oxide 20 % ointment Apply 1 application topically as needed for irritation. Apply to peri/buttocks after every incontinent episode   No facility-administered encounter medications on file as of 05/19/2022.    Review of Systems  Constitutional:  Negative for activity change, appetite change, chills, fatigue and fever.  HENT:  Negative for congestion and trouble swallowing.   Eyes:  Negative for visual disturbance.  Respiratory:  Negative for cough, shortness of breath and wheezing.   Cardiovascular:  Negative for chest pain and leg swelling.  Gastrointestinal:  Negative for abdominal distention, abdominal pain, constipation, diarrhea, nausea and vomiting.  Endocrine: Negative for polydipsia, polyphagia and polyuria.  Genitourinary:  Negative for dysuria, frequency and hematuria.  Musculoskeletal:  Positive for gait problem.  Skin:  Negative for wound.       Dry skin, itching  Neurological:  Positive for weakness. Negative for dizziness and headaches.  Psychiatric/Behavioral:  Positive for confusion and dysphoric mood. Negative for sleep disturbance. The patient is  nervous/anxious.    Immunization History  Administered Date(s) Administered   DTaP 05/12/2014   Influenza, High Dose Seasonal PF 09/21/2017, 09/27/2019, 09/16/2020   Influenza,inj,Quad PF,6+ Mos 09/14/2018   Influenza-Unspecified 10/07/2014, 09/11/2015, 09/23/2016, 10/07/2021   Moderna Sars-Covid-2 Vaccination 12/17/2019, 01/14/2020, 10/27/2020, 05/20/2021   PPD Test 10/16/2014   Pneumococcal Conjugate-13 10/20/2017   Pneumococcal Polysaccharide-23 08/20/2011   Pertinent  Health Maintenance Due  Topic Date Due   OPHTHALMOLOGY EXAM  Never done   FOOT EXAM  05/04/2022   DEXA SCAN  06/08/2022 (Originally 03/21/1993)   INFLUENZA VACCINE  07/13/2022   HEMOGLOBIN A1C  09/25/2022   URINE MICROALBUMIN  Discontinued      10/14/2017    1:51 PM 11/21/2018    1:35 PM 03/05/2019    8:24 PM 03/06/2019    8:00 PM 07/01/2021    4:32 PM  Fall Risk  Falls in the past year? No 0   1  Was there an injury with Fall?  0   0  Fall Risk Category Calculator  0  1  Fall Risk Category  Low   Low  Patient Fall Risk Level  Low fall risk High fall risk High fall risk High fall risk  Patient at Risk for Falls Due to     History of fall(s);Impaired balance/gait;Impaired mobility  Fall risk Follow up     Falls evaluation completed;Education provided;Falls prevention discussed   Functional Status Survey:    Vitals:   05/19/22 1218  BP: 137/86  Pulse: 64  Resp: 18  Temp: (!) 97.4 F (36.3 C)  SpO2: 95%  Weight: 154 lb 14.4 oz (70.3 kg)  Height: 5\' 2"  (1.575 m)   Body mass index is 28.33 kg/m. Physical Exam Vitals reviewed.  Constitutional:      General: She is not in acute distress. HENT:     Head: Normocephalic.     Right Ear: There is no impacted cerumen.     Left Ear: There is no impacted cerumen.     Nose: Nose normal.     Mouth/Throat:     Mouth: Mucous membranes are moist.  Eyes:     General:        Right eye: No discharge.        Left eye: No discharge.  Neck:     Vascular: No  carotid bruit.  Cardiovascular:     Rate and Rhythm: Normal rate and regular rhythm.     Pulses: Normal pulses.     Heart sounds: Normal heart sounds.  Pulmonary:     Effort: Pulmonary effort is normal. No respiratory distress.     Breath sounds: Normal breath sounds. No wheezing.  Abdominal:     General: Bowel sounds are normal. There is no distension.     Palpations: Abdomen is soft.     Tenderness: There is no abdominal tenderness.  Musculoskeletal:     Cervical back: Neck supple.     Right lower leg: No edema.     Left lower leg: No edema.  Lymphadenopathy:     Cervical: No cervical adenopathy.  Skin:    General: Skin is warm and dry.     Capillary Refill: Capillary refill takes less than 2 seconds.     Comments: Scratch marks to bra line, mid back, no sign of infection, extremities with dry/flaking skin  Neurological:     General: No focal deficit present.     Mental Status: She is alert. Mental status is at baseline.     Motor: Weakness present.     Gait: Gait abnormal.     Comments: Wheelchair  Psychiatric:        Mood and Affect: Mood normal.        Behavior: Behavior normal.        Cognition and Memory: Cognition is impaired. Memory is impaired.     Comments: Very pleasant, follows commands, alert to self/place/month    Labs reviewed: Recent Labs    08/21/21 0000 08/24/21 0000 03/26/22 0000  NA 142 144 143  K 4.0 3.8 4.1  CL 105 107 107  CO2 27* 31* 32*  BUN 29* 26* 26*  CREATININE 1.1 0.9 1.0  CALCIUM 9.2 9.2 8.9   Recent Labs    08/21/21 0000 08/24/21 0000 03/26/22 0000  AST 12* 14 17  ALT 16 16 22   ALKPHOS 74 77 67  ALBUMIN 4.0 4.0 3.8   Recent Labs    08/21/21 0000 08/24/21 0000 03/26/22 0000  WBC 9.2 8.9 8.7  NEUTROABS 6,348.00 5,180.00 5,394.00  HGB 11.3* 11.7*  10.6*  HCT 35* 37 33*  PLT 222 238 201   Lab Results  Component Value Date   TSH 2.77 08/24/2021   Lab Results  Component Value Date   HGBA1C 5.9 03/26/2022   Lab  Results  Component Value Date   CHOL 120 03/26/2022   HDL 51 03/26/2022   LDLCALC 55 03/26/2022   TRIG 62 03/26/2022   CHOLHDL 6.3 03/06/2019    Significant Diagnostic Results in last 30 days:  No results found.  Assessment/Plan 1. Depression with anxiety - no mood changes - cont Zoloft  2. Severe vascular dementia with other behavioral disturbance (HCC) - no behavioral outbursts - ambulates with wheelchair - cont skilled nursing care - cont Aricept and Namenda  3. Skin-picking disorder - no lesions at this time - cont triamcinolone prn  4. Primary hypertension - controlled with amlodipine  5. Other hyperlipidemia - LDL 55 03/26/2022 - cont statin  6. Gastroesophageal reflux disease without esophagitis - hgb stable - cont Protonix  7. Prediabetes - A1c < 6 - cont regular diet  8. Anemia, chronic disease - hgb stable  9. Cerebrovascular accident (CVA) due to thrombosis of basilar artery ([place]) - h/o infarct to left median pons - cont Plavix and statin  10. Dry skin - scratch marks to bra line/ middle back - will add back to daily cerave application    Family/ staff Communication: plan discussed with patient and nurse  Labs/tests ordered:  none

## 2022-06-14 ENCOUNTER — Non-Acute Institutional Stay (SKILLED_NURSING_FACILITY): Payer: PPO | Admitting: Orthopedic Surgery

## 2022-06-14 ENCOUNTER — Encounter: Payer: Self-pay | Admitting: Orthopedic Surgery

## 2022-06-14 DIAGNOSIS — I10 Essential (primary) hypertension: Secondary | ICD-10-CM | POA: Diagnosis not present

## 2022-06-14 DIAGNOSIS — F418 Other specified anxiety disorders: Secondary | ICD-10-CM

## 2022-06-14 DIAGNOSIS — K219 Gastro-esophageal reflux disease without esophagitis: Secondary | ICD-10-CM

## 2022-06-14 DIAGNOSIS — R7303 Prediabetes: Secondary | ICD-10-CM

## 2022-06-14 DIAGNOSIS — E7849 Other hyperlipidemia: Secondary | ICD-10-CM

## 2022-06-14 DIAGNOSIS — F01C18 Vascular dementia, severe, with other behavioral disturbance: Secondary | ICD-10-CM | POA: Diagnosis not present

## 2022-06-14 DIAGNOSIS — I6302 Cerebral infarction due to thrombosis of basilar artery: Secondary | ICD-10-CM

## 2022-06-14 DIAGNOSIS — F424 Excoriation (skin-picking) disorder: Secondary | ICD-10-CM

## 2022-06-14 DIAGNOSIS — D638 Anemia in other chronic diseases classified elsewhere: Secondary | ICD-10-CM

## 2022-06-14 DIAGNOSIS — R269 Unspecified abnormalities of gait and mobility: Secondary | ICD-10-CM

## 2022-06-14 DIAGNOSIS — L853 Xerosis cutis: Secondary | ICD-10-CM

## 2022-06-14 NOTE — Progress Notes (Signed)
Location:   [facility] Room Number: 18-A Place of Service:  SNF [phone]) Provider:  Hazle Nordmann, NP  PCP: [name], MD  Patient Care Team: [name], MD as PCP - General (Internal Medicine) Ngetich, Donalee Citrin, NP as Nurse Practitioner [facility])  Extended Emergency Contact Information Primary Emergency Contact: Gordan,[place] Address: [address]          [place], [place] [address] [place] of [place] Home Phone: [phone] Relation: Daughter Secondary Emergency Contact: Iven Finn Address: [address]          [place], [place] [address] [place] of [place] Home Phone: [phone] Relation: Spouse  Code Status:  DNR Goals of care: Advanced Directive information    06/14/2022   10:34 AM  Advanced Directives  Does Patient Have a Medical Advance Directive? Yes  Type of Estate agent of [place];Living will;Out of facility DNR (pink MOST or yellow form)  Does patient want to make changes to medical advance directive? No - Patient declined  Copy of Healthcare Power of Attorney in Chart? Yes - validated most recent copy scanned in chart (See row information)     Chief Complaint  Patient presents with   Medical Management of Chronic Issues    Routine Visit.   Health Maintenance    Discuss the need for Dexa Scan, Eye exam, and Foot exam.    HPI:  Pt is a 88 y.o. female seen today for medical management of chronic diseases.   She currently resides on the skilled nursing unit at [facility]. Past medical conditions include: hypertension, CVA, dementia, GERD, diabetes, CKD stage 3, hyperlipidemia, depression, anxiety and abnormal gait.   Dementia- CT head 02/2019 confirmed small white matter disease, BIMS 12/15 05/2022, no recent behavioral outbursts, ambulates with wheelchair,  remains on Aricept and Namenda  Skin picking- open area to left brow line and upper chest, remains on triamcinolone prn and Cerave   Depression/Anxiety- no mood changes, husband passed earlier this year- denies feeling sad, leaves room often to play bingo, good family support, Na+ 143 03/26/2022, remains on Zoloft HTN- BUN/creat 26/1.0 03/26/2022, remains on amlodipine HLD- LDL 55 03/26/2022, remains on Lipitor GERD- hgb 11.2 (05/22)> was 10.6 (04/14), remains on Protonix Prediabetes- a1c 5.9 03/26/2022, remains on regular diet Anemia- hgb 10.6 03/26/2022, Iron 50, Vit B12 313 03/12/21, ferritin 11 05/03/2022, not on supplement HX CVA- hx acute infarct on left Para median pons, remains on plavix and statin  No recent falls or injuries. Ambulates with wheelchair.   Recent blood pressures:  07/03- 150.3 lbs  06/01- 154.9 lbs  05/03- 154.9 lbs      Past Medical History:  Diagnosis Date   Allergic rhinitis    Anxiety    Balance problem 04/28/2015   Fatigue 04/28/2015   Hearing loss 04/28/2015   history of Fracture of right humerus 04/28/2015   HLD (hyperlipidemia) 03/05/2019   Hyperglycemia    Hyperlipidemia    Hypertension    Memory loss 04/28/2015   04/26/2014 MMSE 28/30. Failed clock drawing. 04/21/15 MMSE 21/30. Failed clock drawing.    Osteoporosis    Past Surgical History:  Procedure Laterality Date   ABDOMINAL HYSTERECTOMY     CATARACT EXTRACTION  2010   ELBOW SURGERY Right 1988   FEMUR FRACTURE SURGERY Right 06/2014   [place], [place] Dr. Dione Housekeeper   HUMERUS FRACTURE SURGERY Right 2015   [place], [place] Dr. Eber Hong   TONSILLECTOMY  [phone]  Allergies  Allergen Reactions   Ace Inhibitors Other (See Comments)    Unknown reaction   Almond Oil Rash   Plum Pulp Rash    Allergies as of 06/14/2022       Reactions   Ace Inhibitors Other (See Comments)   Unknown reaction   Almond Oil Rash   Plum Pulp Rash        Medication List        Accurate as of June 14, 2022 10:37 AM. If you have any questions, ask your nurse or doctor.          amLODipine 5 MG tablet Commonly known as:  NORVASC Take 5 mg by mouth daily.   atorvastatin 40 MG tablet Commonly known as: LIPITOR Take 1 tablet (40 mg total) by mouth daily at 6 PM.   B12-Active 1 MG Chew Generic drug: Methylcobalamin Chew 1 tablet by mouth daily.   calcium carbonate 1500 (600 Ca) MG Tabs tablet Commonly known as: OSCAL Take 600 mg of elemental calcium by mouth daily with breakfast.   CeraVe Crea Apply topically daily.   clopidogrel 75 MG tablet Commonly known as: PLAVIX Take 1 tablet (75 mg total) by mouth daily.   donepezil 10 MG tablet Commonly known as: ARICEPT TAKE ONE TABLET BY MOUTH DAILY TO PRESERVE MEMORY   Fish Oil 500 MG Caps Take 1 capsule by mouth daily.   memantine 10 MG tablet Commonly known as: Namenda Take 1 tablet (10 mg total) by mouth 2 (two) times daily.   pantoprazole 40 MG tablet Commonly known as: Protonix Take 1 tablet (40 mg total) by mouth daily.   sertraline 25 MG tablet Commonly known as: ZOLOFT Take 25 mg by mouth daily. Take three tablets =75 mg daily   triamcinolone cream 0.1 % Commonly known as: KENALOG Apply 1 application. topically 2 (two) times daily as needed (For red area on left arm).   Vitamin D3 25 MCG (1000 UT) Caps Take 1,000 Units daily by mouth.   zinc oxide 20 % ointment Apply 1 application topically as needed for irritation. Apply to peri/buttocks after every incontinent episode        Review of Systems  Unable to perform ROS: Dementia    Immunization History  Administered Date(s) Administered   DTaP 05/12/2014   Influenza, High Dose Seasonal PF 09/21/2017, 09/27/2019, 09/16/2020   Influenza,inj,Quad PF,6+ Mos 09/14/2018   Influenza-Unspecified 10/07/2014, 09/11/2015, 09/23/2016, 10/07/2021   Moderna Sars-Covid-2 Vaccination 12/17/2019, 01/14/2020, 10/27/2020, 05/20/2021   PPD Test 10/16/2014   Pneumococcal Conjugate-13 10/20/2017   Pneumococcal Polysaccharide-23 08/20/2011   Unspecified SARS-COV-2 Vaccination 05/26/2022    Pertinent  Health Maintenance Due  Topic Date Due   OPHTHALMOLOGY EXAM  Never done   DEXA SCAN  Never done   FOOT EXAM  05/04/2022   INFLUENZA VACCINE  07/13/2022   HEMOGLOBIN A1C  09/25/2022   URINE MICROALBUMIN  Discontinued      10/14/2017    1:51 PM 11/21/2018    1:35 PM 03/05/2019    8:24 PM 03/06/2019    8:00 PM 07/01/2021    4:32 PM  Fall Risk  Falls in the past year? No 0   1  Was there an injury with Fall?  0   0  Fall Risk Category Calculator  0   1  Fall Risk Category  Low   Low  Patient Fall Risk Level  Low fall risk High fall risk High fall risk High fall risk  Patient at Risk for  Falls Due to     History of fall(s);Impaired balance/gait;Impaired mobility  Fall risk Follow up     Falls evaluation completed;Education provided;Falls prevention discussed   Functional Status Survey:    Vitals:   06/14/22 1031  BP: 137/77  Pulse: 72  Resp: (!) 21  Temp: (!) 97 F (36.1 C)  SpO2: 93%  Weight: 150 lb 4.8 oz (68.2 kg)  Height: 5\' 2"  (1.575 m)   Body mass index is 27.49 kg/m. Physical Exam Vitals reviewed.  Constitutional:      General: She is not in acute distress. HENT:     Head: Normocephalic.     Right Ear: There is no impacted cerumen.     Left Ear: There is no impacted cerumen.     Nose: Nose normal.     Mouth/Throat:     Mouth: Mucous membranes are moist.  Eyes:     General:        Right eye: No discharge.        Left eye: No discharge.  Cardiovascular:     Rate and Rhythm: Normal rate and regular rhythm.     Pulses: Normal pulses.     Heart sounds: Normal heart sounds.  Pulmonary:     Effort: Pulmonary effort is normal. No respiratory distress.     Breath sounds: Normal breath sounds. No wheezing.  Abdominal:     General: Bowel sounds are normal. There is no distension.     Palpations: Abdomen is soft.     Tenderness: There is no abdominal tenderness.  Musculoskeletal:     Cervical back: Neck supple.     Right lower leg: No edema.      Left lower leg: No edema.  Skin:    General: Skin is warm and dry.     Capillary Refill: Capillary refill takes less than 2 seconds.     Findings: Lesion present.     Comments: 1 cm lesion to left brow and left upper chest, CDI, no drainage, granulation tissue present, surrounding tissue intact, overall skin dry  Neurological:     General: No focal deficit present.     Mental Status: She is alert. Mental status is at baseline.     Motor: Weakness present.     Gait: Gait abnormal.     Comments: wheelchair  Psychiatric:        Mood and Affect: Mood normal.        Behavior: Behavior normal.        Cognition and Memory: Cognition is impaired. Memory is impaired.     Comments: Very pleasant, follows commands, alert to self/familiar face     Labs reviewed: Recent Labs    08/21/21 0000 08/24/21 0000 03/26/22 0000  NA 142 144 143  K 4.0 3.8 4.1  CL 105 107 107  CO2 27* 31* 32*  BUN 29* 26* 26*  CREATININE 1.1 0.9 1.0  CALCIUM 9.2 9.2 8.9   Recent Labs    08/21/21 0000 08/24/21 0000 03/26/22 0000  AST 12* 14 17  ALT 16 16 22   ALKPHOS 74 77 67  ALBUMIN 4.0 4.0 3.8   Recent Labs    08/21/21 0000 08/24/21 0000 03/26/22 0000 05/03/22 0000  WBC 9.2 8.9 8.7 8.2  NEUTROABS 6,348.00 5,180.00 5,394.00  --   HGB 11.3* 11.7* 10.6* 11.2*  HCT 35* 37 33* 35*  PLT 222 238 201 227   Lab Results  Component Value Date   TSH 2.77 08/24/2021   Lab  Results  Component Value Date   HGBA1C 5.9 03/26/2022   Lab Results  Component Value Date   CHOL 120 03/26/2022   HDL 51 03/26/2022   LDLCALC 55 03/26/2022   TRIG 62 03/26/2022   CHOLHDL 6.3 03/06/2019    Significant Diagnostic Results in last 30 days:  No results found.  Assessment/Plan 1. Severe vascular dementia with other behavioral disturbance (HCC) - no behaviors - ambulates with wheelchair - skin picking - cont Aricept and Namenda - cont skilled nursing care  2. Skin-picking disorder - see above - 2 lesions <  1 cm to face and chest- no sign of infection - cont triamcinolone cream and Cerave lotion  3. Depression with anxiety - no mood changes, husband passed earlier this year - leaves room often - cont Zoloft  4. Primary hypertension - controlled  - cont amlodipine  5. Other hyperlipidemia - LDL stable - cont Lipitor  6. Gastroesophageal reflux disease without esophagitis - hgb stable - cont Protonix  7. Prediabetes - A1c 5.9 03/26/2022 - cont regular diet  8. Anemia, chronic disease - hgb improved  9. Cerebrovascular accident (CVA) due to thrombosis of basilar artery (HCC) - cont Plavix and Lipitor  10. Dry skin - cont Cerave  11. Gait abnormality - ambulates with wheelchair - cont skilled nursing care    Family/ staff Communication: plan discussed with patient and nurse  Labs/tests ordered: none

## 2022-07-07 ENCOUNTER — Encounter: Payer: Self-pay | Admitting: Orthopedic Surgery

## 2022-07-07 ENCOUNTER — Non-Acute Institutional Stay (INDEPENDENT_AMBULATORY_CARE_PROVIDER_SITE_OTHER): Payer: PPO | Admitting: Orthopedic Surgery

## 2022-07-07 DIAGNOSIS — Z0000 Encounter for general adult medical examination without abnormal findings: Secondary | ICD-10-CM | POA: Diagnosis not present

## 2022-07-07 NOTE — Progress Notes (Signed)
Subjective:   Ana [name] is a 88 y.o. female who presents for Medicare Annual (Subsequent) preventive examination.  Place of Service: [facility]- skilled nursing  Provider: Hazle Nordmann, AGNP-C   Review of Systems     Cardiac Risk Factors include: advanced age (>30men, >30 women);sedentary lifestyle;hypertension;diabetes mellitus     Objective:    Today's Vitals   07/07/22 1029  BP: 135/83  Pulse: 78  Resp: (!) 21  Temp: (!) 97.3 F (36.3 C)  SpO2: 93%  Weight: 150 lb 4.8 oz (68.2 kg)  Height: 5\' 2"  (1.575 m)   Body mass index is 27.49 kg/m.     07/07/2022   10:36 AM 06/14/2022   10:34 AM 04/15/2022    9:42 AM 03/24/2022    9:45 AM 02/16/2022    3:05 PM 01/21/2022   10:50 AM 12/23/2021    2:32 PM  Advanced Directives  Does Patient Have a Medical Advance Directive? Yes Yes Yes Yes Yes Yes Yes  Type of 02/20/2022 of [employer];Living will;Out of facility DNR (pink MOST or yellow form) Healthcare Power of [place];Living will;Out of facility DNR (pink MOST or yellow form) Healthcare Power of [place];Living will;Out of facility DNR (pink MOST or yellow form) Healthcare Power of [place];Living will;Out of facility DNR (pink MOST or yellow form) Healthcare Power of [place];Living will;Out of facility DNR (pink MOST or yellow form) Healthcare Power of [place];Living will;Out of facility DNR (pink MOST or yellow form) Healthcare Power of [place];Living will;Out of facility DNR (pink MOST or yellow form)  Does patient want to make changes to medical advance directive? No - Patient declined No - Patient declined No - Patient declined No - Patient declined No - Patient declined No - Patient declined No - Patient declined  Copy of Healthcare Power of Attorney in Chart? Yes - validated most recent copy scanned in chart (See row information) Yes - validated most recent copy scanned in chart (See row information) Yes - validated most recent copy scanned in  chart (See row information) Yes - validated most recent copy scanned in chart (See row information) Yes - validated most recent copy scanned in chart (See row information) Yes - validated most recent copy scanned in chart (See row information) Yes - validated most recent copy scanned in chart (See row information)  Pre-existing out of facility DNR order (yellow form or pink MOST form)   Yellow form placed in chart (order not valid for inpatient use) Yellow form placed in chart (order not valid for inpatient use) Yellow form placed in chart (order not valid for inpatient use) Yellow form placed in chart (order not valid for inpatient use) Yellow form placed in chart (order not valid for inpatient use)    Current Medications (verified) Outpatient Encounter Medications as of 07/07/2022  Medication Sig   amLODipine (NORVASC) 5 MG tablet Take 5 mg by mouth daily.    atorvastatin (LIPITOR) 40 MG tablet Take 1 tablet (40 mg total) by mouth daily at 6 PM.   calcium carbonate (OSCAL) 1500 (600 Ca) MG TABS tablet Take 600 mg of elemental calcium by mouth daily with breakfast.   Cholecalciferol (VITAMIN D3) 1000 UNITS CAPS Take 1,000 Units daily by mouth.    clopidogrel (PLAVIX) 75 MG tablet Take 1 tablet (75 mg total) by mouth daily.   donepezil (ARICEPT) 10 MG tablet TAKE ONE TABLET BY MOUTH DAILY TO PRESERVE MEMORY   Emollient (CERAVE) CREA Apply topically daily.   memantine (NAMENDA) 10  MG tablet Take 1 tablet (10 mg total) by mouth 2 (two) times daily.   Methylcobalamin (B12-ACTIVE) 1 MG CHEW Chew 1 tablet by mouth daily.   Omega-3 Fatty Acids (FISH OIL) 500 MG CAPS Take 1 capsule by mouth daily.   pantoprazole (PROTONIX) 40 MG tablet Take 1 tablet (40 mg total) by mouth daily.   sertraline (ZOLOFT) 25 MG tablet Take 25 mg by mouth daily. Take three tablets =75 mg daily   triamcinolone cream (KENALOG) 0.1 % Apply 1 application. topically 2 (two) times daily as needed (For red area on left arm).   zinc  oxide 20 % ointment Apply 1 application topically as needed for irritation. Apply to peri/buttocks after every incontinent episode   No facility-administered encounter medications on file as of 07/07/2022.    Allergies (verified) Ace inhibitors, Almond oil, and Plum pulp   History: Past Medical History:  Diagnosis Date   Allergic rhinitis    Anxiety    Balance problem 04/28/2015   Fatigue 04/28/2015   Hearing loss 04/28/2015   history of Fracture of right humerus 04/28/2015   HLD (hyperlipidemia) 03/05/2019   Hyperglycemia    Hyperlipidemia    Hypertension    Memory loss 04/28/2015   04/26/2014 MMSE 28/30. Failed clock drawing. 04/21/15 MMSE 21/30. Failed clock drawing.    Osteoporosis    Past Surgical History:  Procedure Laterality Date   ABDOMINAL HYSTERECTOMY     CATARACT EXTRACTION  2010   ELBOW SURGERY Right 1988   FEMUR FRACTURE SURGERY Right 06/2014   [place], [place] Dr. Dione Housekeeper   HUMERUS FRACTURE SURGERY Right 2015   [place], [place] Dr. Eber Hong   TONSILLECTOMY  [phone]   Family History  Problem Relation Age of Onset   Heart disease Mother    Diabetes Daughter    Social History   Socioeconomic History   Marital status: Unknown    Spouse name: Not on file   Number of children: Not on file   Years of education: Not on file   Highest education level: Not on file  Occupational History   Occupation: Housewife  Tobacco Use   Smoking status: Never   Smokeless tobacco: Never  Vaping Use   Vaping Use: Never used  Substance and Sexual Activity   Alcohol use: Yes    Alcohol/week: 1.0 - 2.0 standard drink of alcohol    Types: 1 - 2 Glasses of wine per week    Comment: 1-2 glasses 1-2 times a week   Drug use: No   Sexual activity: Not on file  Other Topics Concern   Not on file  Social History Narrative   [facility]   Married [name]   Never smoked   Alcohol -wine 1-2 daily   Caffeine coffee 2 daily   Exercise walking   Walks  with walker   Living Will, POA, DNR   Social Determinants of Health   Financial Resource Strain: Low Risk  (07/07/2022)   Overall Financial Resource Strain (CARDIA)    Difficulty of Paying Living Expenses: Not hard at all  Food Insecurity: Unknown (07/07/2022)   Hunger Vital Sign    Worried About Running Out of Food in the Last Year: Never true    Ran Out of Food in the Last Year: Not on file  Transportation Needs: No Transportation Needs (07/07/2022)   PRAPARE - Administrator, Civil Service (Medical): No    Lack of Transportation (Non-Medical): No  Physical  Activity: Inactive (07/07/2022)   Exercise Vital Sign    Days of Exercise per Week: 0 days    Minutes of Exercise per Session: 0 min  Stress: No Stress Concern Present (07/07/2022)   [employer] of Occupational Health - Occupational Stress Questionnaire    Feeling of Stress : Not at all  Social Connections: Socially Isolated (07/07/2022)   Social Connection and Isolation Panel [NHANES]    Frequency of Communication with Friends and Family: Twice a week    Frequency of Social Gatherings with Friends and Family: Once a week    Attends Religious Services: Never    Database administrator or Organizations: No    Attends Engineer, structural: Not on file    Marital Status: Widowed    Tobacco Counseling Counseling given: Not Answered   Clinical Intake:  Pre-visit preparation completed: No  Pain : No/denies pain     BMI - recorded: 27.49 Nutritional Status: BMI 25 -29 Overweight Nutritional Risks: None Diabetes: Yes CBG done?: No Did pt. bring in CBG monitor from home?: No  How often do you need to have someone help you when you read instructions, pamphlets, or other written materials from your doctor or pharmacy?: 5 - Always What is the last grade level you completed in school?: unable to answer  Diabetic?Yes  Interpreter Needed?: No      Activities of Daily Living    07/07/2022    12:59 PM  In your present state of health, do you have any difficulty performing the following activities:  Hearing? 0  Vision? 0  Difficulty concentrating or making decisions? 1  Walking or climbing stairs? 1  Dressing or bathing? 1  Doing errands, shopping? 1  Preparing Food and eating ? Y  Using the Toilet? Y  In the past six months, have you accidently leaked urine? Y  Do you have problems with loss of bowel control? N  Managing your Medications? Y  Managing your Finances? Y  Housekeeping or managing your Housekeeping? Y    Patient Care Team: [name], MD as PCP - General (Internal Medicine) Ngetich, Donalee Citrin, NP as Nurse Practitioner (Family Medicine)  Indicate any recent Medical Services you may have received from other than [facility] providers in the past year (date may be approximate).     Assessment:   This is a routine wellness examination for Ana [name].  Hearing/Vision screen No results found.  Dietary issues and exercise activities discussed: Current Exercise Habits: The patient does not participate in regular exercise at present, Exercise limited by: cardiac condition(s);neurologic condition(s);orthopedic condition(s)   Goals Addressed             This Visit's Progress    Maintain Mobility and Function   On track    Evidence-based guidance:  Emphasize the importance of physical activity and aerobic exercise as included in treatment plan; assess barriers to adherence; consider patient's abilities and preferences.  Encourage gradual increase in activity or exercise instead of stopping if pain occurs.  Reinforce individual therapy exercise prescription, such as strengthening, stabilization and stretching programs.  Promote optimal body mechanics to stabilize the spine with lifting and functional activity.  Encourage activity and mobility modifications to facilitate optimal function, such as using a log roll for bed mobility or dressing from a seated position.   Reinforce individual adaptive equipment recommendations to limit excessive spinal movements, such as a Event organiser.  Assess adequacy of sleep; encourage use of sleep hygiene techniques, such as bedtime routine;  use of white noise; dark, cool bedroom; avoiding daytime naps, heavy meals or exercise before bedtime.  Promote positions and modification to optimize sleep and sexual activity; consider pillows or positioning devices to assist in maintaining neutral spine.  Explore options for applying ergonomic principles at work and home, such as frequent position changes, using ergonomically designed equipment and working at optimal height.  Promote modifications to increase comfort with driving such as lumbar support, optimizing seat and steering wheel position, using cruise control and taking frequent rest stops to stretch and walk.   Notes:        Depression Screen    07/07/2022   12:57 PM 07/01/2021    4:29 PM 04/17/2019   11:48 AM 07/19/2018   10:43 AM 10/14/2017    1:51 PM 08/24/2016   10:49 AM 01/06/2015    1:46 PM  PHQ 2/9 Scores  PHQ - 2 Score 0 0 0 0 0 0 1  PHQ- 9 Score    0       Fall Risk    07/07/2022   12:59 PM 07/07/2022   10:36 AM 07/01/2021    4:32 PM 11/21/2018    1:35 PM 10/14/2017    1:51 PM  Fall Risk   Falls in the past year? 1 0 1 0 No  Number falls in past yr: 0 0 0 0   Injury with Fall? 0 0 0 0   Risk for fall due to : History of fall(s);Impaired balance/gait;Impaired mobility No Fall Risks History of fall(s);Impaired balance/gait;Impaired mobility    Follow up Falls evaluation completed;Education provided;Falls prevention discussed Falls evaluation completed Falls evaluation completed;Education provided;Falls prevention discussed      FALL RISK PREVENTION PERTAINING TO THE HOME:  Any stairs in or around the home? No  If so, are there any without handrails? No  Home free of loose throw rugs in walkways, pet beds, electrical cords, etc? Yes  Adequate  lighting in your home to reduce risk of falls? Yes   ASSISTIVE DEVICES UTILIZED TO PREVENT FALLS:  Life alert? No  Use of a cane, walker or w/c? Yes  Grab bars in the bathroom? Yes  Shower chair or bench in shower? Yes  Elevated toilet seat or a handicapped toilet? Yes   TIMED UP AND GO:  Was the test performed? No .  Length of time to ambulate 10 feet: N/A sec.   Gait slow and steady with assistive device  Cognitive Function:    07/07/2022    1:00 PM 07/01/2021    4:33 PM 08/07/2019    1:33 PM 07/19/2018   10:05 AM 02/15/2018   10:31 AM  MMSE - Mini Mental State Exam  Not completed: Unable to complete Unable to complete     Orientation to time   5 4 5   Orientation to Place   4 5 5   Registration   3 3 2   Attention/ Calculation   0 4 2  Attention/Calculation-comments   refused    Recall   1 2 0  Language- name 2 objects   2 2 2   Language- repeat   1 1 1   Language- follow 3 step command   3 3 3   Language- read & follow direction   1 1 1   Write a sentence   0 1 1  Copy design   0 1 0  Total score   20 27 22         07/07/2022    1:00 PM 07/01/2021  4:34 PM  6CIT Screen  What Year? 0 points 0 points  What month? 0 points 0 points  What time? 3 points 3 points  Count back from 20 2 points 2 points  Months in reverse 2 points 2 points  Repeat phrase 6 points 4 points  Total Score 13 points 11 points    Immunizations Immunization History  Administered Date(s) Administered   DTaP 05/12/2014   Influenza, High Dose Seasonal PF 09/21/2017, 09/27/2019, 09/16/2020   Influenza,inj,Quad PF,6+ Mos 09/14/2018   Influenza-Unspecified 10/07/2014, 09/11/2015, 09/23/2016, 10/07/2021   Moderna Sars-Covid-2 Vaccination 12/17/2019, 01/14/2020, 10/27/2020, 05/20/2021   PPD Test 10/16/2014   Pneumococcal Conjugate-13 10/20/2017   Pneumococcal Polysaccharide-23 08/20/2011   Unspecified SARS-COV-2 Vaccination 05/26/2022    TDAP status: Up to date  Flu Vaccine status: Up to  date  Pneumococcal vaccine status: Up to date  Covid-19 vaccine status: Completed vaccines  Qualifies for Shingles Vaccine? No   Zostavax completed No   Shingrix Completed?: No.    Education has been provided regarding the importance of this vaccine. Patient has been advised to call insurance company to determine out of pocket expense if they have not yet received this vaccine. Advised may also receive vaccine at local pharmacy or Health Dept. Verbalized acceptance and understanding.  Screening Tests Health Maintenance  Topic Date Due   OPHTHALMOLOGY EXAM  Never done   DEXA SCAN  Never done   FOOT EXAM  05/04/2022   INFLUENZA VACCINE  07/13/2022   COVID-19 Vaccine (6 - Moderna series) 07/21/2022   HEMOGLOBIN A1C  09/25/2022   TETANUS/TDAP  12/14/2023   Pneumonia Vaccine 56+ Years old  Completed   HPV VACCINES  Aged Out   URINE MICROALBUMIN  Discontinued   Zoster Vaccines- Shingrix  Discontinued    Health Maintenance  Health Maintenance Due  Topic Date Due   OPHTHALMOLOGY EXAM  Never done   DEXA SCAN  Never done   FOOT EXAM  05/04/2022    Colorectal cancer screening: No longer required.   Mammogram status: No longer required due to advanced age.  Bone Density status: Completed 1994. Results reflect: Bone density results: OSTEOPOROSIS. Repeat every no further studies per goals of care years.  Lung Cancer Screening: (Low Dose CT Chest recommended if Age 26-80 years, 30 pack-year currently smoking OR have quit w/in 15years.) does not qualify.   Lung Cancer Screening Referral: No  Additional Screening:  Hepatitis C Screening: does not qualify; advanced age  Vision Screening: Recommended annual ophthalmology exams for early detection of glaucoma and other disorders of the eye. Is the patient up to date with their annual eye exam?  No  Who is the provider or what is the name of the office in which the patient attends annual eye exams? unknown If pt is not established with  a provider, would they like to be referred to a provider to establish care? No .   Dental Screening: Recommended annual dental exams for proper oral hygiene  Community Resource Referral / Chronic Care Management: CRR required this visit?  No   CCM required this visit?  No      Plan:     I have personally reviewed and noted the following in the patient's chart:   Medical and social history Use of alcohol, tobacco or illicit drugs  Current medications and supplements including opioid prescriptions.  Functional ability and status Nutritional status Physical activity Advanced directives List of other physicians Hospitalizations, surgeries, and ER visits in previous 12 months Vitals  Screenings to include cognitive, depression, and falls Referrals and appointments  In addition, I have reviewed and discussed with patient certain preventive protocols, quality metrics, and best practice recommendations. A written personalized care plan for preventive services as well as general preventive health recommendations were provided to patient.     Octavia Heir, NP   07/07/2022

## 2022-07-15 ENCOUNTER — Non-Acute Institutional Stay (SKILLED_NURSING_FACILITY): Payer: PPO | Admitting: Internal Medicine

## 2022-07-15 ENCOUNTER — Encounter: Payer: Self-pay | Admitting: Internal Medicine

## 2022-07-15 DIAGNOSIS — F01C18 Vascular dementia, severe, with other behavioral disturbance: Secondary | ICD-10-CM | POA: Diagnosis not present

## 2022-07-15 DIAGNOSIS — F418 Other specified anxiety disorders: Secondary | ICD-10-CM

## 2022-07-15 DIAGNOSIS — R051 Acute cough: Secondary | ICD-10-CM

## 2022-07-15 DIAGNOSIS — F424 Excoriation (skin-picking) disorder: Secondary | ICD-10-CM | POA: Diagnosis not present

## 2022-07-15 DIAGNOSIS — K219 Gastro-esophageal reflux disease without esophagitis: Secondary | ICD-10-CM

## 2022-07-15 DIAGNOSIS — I10 Essential (primary) hypertension: Secondary | ICD-10-CM | POA: Diagnosis not present

## 2022-07-15 DIAGNOSIS — E7849 Other hyperlipidemia: Secondary | ICD-10-CM

## 2022-07-15 DIAGNOSIS — D638 Anemia in other chronic diseases classified elsewhere: Secondary | ICD-10-CM

## 2022-07-15 NOTE — Progress Notes (Signed)
Location:   [facility] Room Number: 18-A Place of Service:  SNF [phone]) Provider: Merian Capron      Patient Care Team: [name], MD as PCP - General (Internal Medicine) Ngetich, Donalee Citrin, NP as Nurse Practitioner [facility] Medicine)  Extended Emergency Contact Information Primary Emergency Contact: Gordan,[place] Address: [address]          [place], [place] [address] [place] of [place] Home Phone: [phone] Relation: Daughter Secondary Emergency Contact: Iven Finn Address: [address]          [place], [place] [address] [place] of [place] Home Phone: [phone] Relation: Spouse  Code Status:  DNR Goals of care: Advanced Directive information    07/15/2022   12:08 PM  Advanced Directives  Does Patient Have a Medical Advance Directive? Yes  Type of Estate agent of [place];Living will;Out of facility DNR (pink MOST or yellow form)  Does patient want to make changes to medical advance directive? No - Patient declined  Copy of Healthcare Power of Attorney in Chart? Yes - validated most recent copy scanned in chart (See row information)     Chief Complaint  Patient presents with   Medical Management of Chronic Issues    Routine Visit.    Health Maintenance    Discuss the need for Eye exam, Foot exam, and Dexa scan.    Immunizations    Discuss the need for Influenza vaccine.    HPI:  Pt is a 88 y.o. female seen today for medical management of chronic diseases.    Patient has history of dementia, anxiety, With Skin Picking  hypertension and hyperlipidemia  History of  Acute CVA  MRI showed Acute infarction on Left Para median Pons       She is stable. No new Nursing issues. No Behavior issues Her weight is stable Stays in her room most of the time Did have cough today but no sob or chest pain or Fever No Falls Wt Readings from Last 3 Encounters:  07/15/22 150 lb 14.4 oz (68.4 kg)  07/07/22 150 lb  4.8 oz (68.2 kg)  06/14/22 150 lb 4.8 oz (68.2 kg)   Past Medical History:  Diagnosis Date   Allergic rhinitis    Anxiety    Balance problem 04/28/2015   Fatigue 04/28/2015   Hearing loss 04/28/2015   history of Fracture of right humerus 04/28/2015   HLD (hyperlipidemia) 03/05/2019   Hyperglycemia    Hyperlipidemia    Hypertension    Memory loss 04/28/2015   04/26/2014 MMSE 28/30. Failed clock drawing. 04/21/15 MMSE 21/30. Failed clock drawing.    Osteoporosis    Past Surgical History:  Procedure Laterality Date   ABDOMINAL HYSTERECTOMY     CATARACT EXTRACTION  2010   ELBOW SURGERY Right 1988   FEMUR FRACTURE SURGERY Right 06/2014   [place], [place] Dr. Dione Housekeeper   HUMERUS FRACTURE SURGERY Right 2015   [place], [place] Dr. Eber Hong   TONSILLECTOMY  [phone]    Allergies  Allergen Reactions   Ace Inhibitors Other (See Comments)    Unknown reaction   Almond Oil Rash   Plum Pulp Rash    Allergies as of 07/15/2022       Reactions   Ace Inhibitors Other (See Comments)   Unknown reaction   Almond Oil Rash   Plum Pulp Rash        Medication List        Accurate as of July 15, 2022  12:08 PM. If you have any questions, ask your nurse or doctor.          amLODipine 5 MG tablet Commonly known as: NORVASC Take 5 mg by mouth daily.   atorvastatin 40 MG tablet Commonly known as: LIPITOR Take 1 tablet (40 mg total) by mouth daily at 6 PM.   B12-Active 1 MG Chew Generic drug: Methylcobalamin Chew 1 tablet by mouth daily.   calcium carbonate 1500 (600 Ca) MG Tabs tablet Commonly known as: OSCAL Take 600 mg of elemental calcium by mouth daily with breakfast.   CeraVe Crea Apply topically daily.   clopidogrel 75 MG tablet Commonly known as: PLAVIX Take 1 tablet (75 mg total) by mouth daily.   donepezil 10 MG tablet Commonly known as: ARICEPT TAKE ONE TABLET BY MOUTH DAILY TO PRESERVE MEMORY   Fish Oil 500 MG Caps Take 1 capsule by mouth daily.   memantine  10 MG tablet Commonly known as: Namenda Take 1 tablet (10 mg total) by mouth 2 (two) times daily.   pantoprazole 40 MG tablet Commonly known as: Protonix Take 1 tablet (40 mg total) by mouth daily.   sertraline 25 MG tablet Commonly known as: ZOLOFT Take 25 mg by mouth daily. Take three tablets =75 mg daily   triamcinolone cream 0.1 % Commonly known as: KENALOG Apply 1 application. topically 2 (two) times daily as needed (For red area on left arm).   Vitamin D3 25 MCG (1000 UT) Caps Take 1,000 Units daily by mouth.   zinc oxide 20 % ointment Apply 1 application topically as needed for irritation. Apply to peri/buttocks after every incontinent episode        Review of Systems  Constitutional:  Negative for activity change and appetite change.  HENT: Negative.    Respiratory:  Positive for cough. Negative for shortness of breath.   Cardiovascular:  Negative for leg swelling.  Gastrointestinal:  Negative for constipation.  Genitourinary: Negative.   Musculoskeletal:  Positive for gait problem. Negative for arthralgias and myalgias.  Skin: Negative.   Neurological:  Negative for dizziness and weakness.  Psychiatric/Behavioral:  Positive for confusion. Negative for dysphoric mood and sleep disturbance.     Immunization History  Administered Date(s) Administered   DTaP 05/12/2014   Influenza, High Dose Seasonal PF 09/21/2017, 09/27/2019, 09/16/2020   Influenza,inj,Quad PF,6+ Mos 09/14/2018   Influenza-Unspecified 10/07/2014, 09/11/2015, 09/23/2016, 10/07/2021   Moderna Sars-Covid-2 Vaccination 12/17/2019, 01/14/2020, 10/27/2020, 05/20/2021   PPD Test 10/16/2014   Pneumococcal Conjugate-13 10/20/2017   Pneumococcal Polysaccharide-23 08/20/2011   Unspecified SARS-COV-2 Vaccination 05/26/2022   Pertinent  Health Maintenance Due  Topic Date Due   OPHTHALMOLOGY EXAM  Never done   DEXA SCAN  Never done   FOOT EXAM  05/04/2022   INFLUENZA VACCINE  07/13/2022   HEMOGLOBIN  A1C  09/25/2022   URINE MICROALBUMIN  Discontinued      03/05/2019    8:24 PM 03/06/2019    8:00 PM 07/01/2021    4:32 PM 07/07/2022   10:36 AM 07/07/2022   12:59 PM  Fall Risk  Falls in the past year?   1 0 1  Was there an injury with Fall?   0 0 0  Fall Risk Category Calculator   1 0 1  Fall Risk Category   Low Low Low  Patient Fall Risk Level High fall risk High fall risk High fall risk Low fall risk Moderate fall risk  Patient at Risk for Falls Due to   History of  fall(s);Impaired balance/gait;Impaired mobility No Fall Risks History of fall(s);Impaired balance/gait;Impaired mobility  Fall risk Follow up   Falls evaluation completed;Education provided;Falls prevention discussed Falls evaluation completed Falls evaluation completed;Education provided;Falls prevention discussed   Functional Status Survey:    Vitals:   07/15/22 1206  BP: (!) 142/67  Pulse: 74  Resp: 18  Temp: (!) 97.2 F (36.2 C)  SpO2: 94%  Weight: 150 lb 14.4 oz (68.4 kg)  Height: 5\' 2"  (1.575 m)   Body mass index is 27.6 kg/m. Physical Exam Vitals reviewed.  Constitutional:      Appearance: Normal appearance.  HENT:     Head: Normocephalic.     Nose: Nose normal.     Mouth/Throat:     Mouth: Mucous membranes are moist.     Pharynx: Oropharynx is clear.  Eyes:     Pupils: Pupils are equal, round, and reactive to light.  Cardiovascular:     Rate and Rhythm: Normal rate and regular rhythm.     Pulses: Normal pulses.     Heart sounds: Normal heart sounds. No murmur heard. Pulmonary:     Effort: Pulmonary effort is normal.     Breath sounds: Normal breath sounds. No rales.  Abdominal:     General: Abdomen is flat. Bowel sounds are normal.     Palpations: Abdomen is soft.  Musculoskeletal:        General: No swelling.     Cervical back: Neck supple.  Skin:    General: Skin is warm.  Neurological:     General: No focal deficit present.     Mental Status: She is alert.  Psychiatric:        Mood  and Affect: Mood normal.        Thought Content: Thought content normal.     Labs reviewed: Recent Labs    08/21/21 0000 08/24/21 0000 03/26/22 0000  NA 142 144 143  K 4.0 3.8 4.1  CL 105 107 107  CO2 27* 31* 32*  BUN 29* 26* 26*  CREATININE 1.1 0.9 1.0  CALCIUM 9.2 9.2 8.9   Recent Labs    08/21/21 0000 08/24/21 0000 03/26/22 0000  AST 12* 14 17  ALT 16 16 22   ALKPHOS 74 77 67  ALBUMIN 4.0 4.0 3.8   Recent Labs    08/21/21 0000 08/24/21 0000 03/26/22 0000 05/03/22 0000  WBC 9.2 8.9 8.7 8.2  NEUTROABS 6,348.00 5,180.00 5,394.00  --   HGB 11.3* 11.7* 10.6* 11.2*  HCT 35* 37 33* 35*  PLT 222 238 201 227   Lab Results  Component Value Date   TSH 2.77 08/24/2021   Lab Results  Component Value Date   HGBA1C 5.9 03/26/2022   Lab Results  Component Value Date   CHOL 120 03/26/2022   HDL 51 03/26/2022   LDLCALC 55 03/26/2022   TRIG 62 03/26/2022   CHOLHDL 6.3 03/06/2019    Significant Diagnostic Results in last 30 days:  No results found.  Assessment/Plan 1. Severe vascular dementia with other behavioral disturbance (HCC) In Snf Full care Needs help with her Transfers Also on Namenda and Aricept Plavix and statin 2. Skin-picking disorder Stable on Zoloft  3. Depression with anxiety Stable on Zoloft  4. Primary hypertension On Norvasc  5. Other hyperlipidemia ON statin stable LDL 55 in 4/23  6. Gastroesophageal reflux disease without esophagitis On Protonix  7. Anemia, chronic disease Low Iron stores But HGB acceptable Will watch for now  8. Acute cough ? Viral  D/W nurse to monitor    Family/ staff Communication:   Labs/tests ordered:

## 2022-08-18 ENCOUNTER — Non-Acute Institutional Stay (SKILLED_NURSING_FACILITY): Payer: PPO | Admitting: Orthopedic Surgery

## 2022-08-18 ENCOUNTER — Encounter: Payer: Self-pay | Admitting: Orthopedic Surgery

## 2022-08-18 DIAGNOSIS — I10 Essential (primary) hypertension: Secondary | ICD-10-CM

## 2022-08-18 DIAGNOSIS — F01C18 Vascular dementia, severe, with other behavioral disturbance: Secondary | ICD-10-CM

## 2022-08-18 DIAGNOSIS — E7849 Other hyperlipidemia: Secondary | ICD-10-CM

## 2022-08-18 DIAGNOSIS — K219 Gastro-esophageal reflux disease without esophagitis: Secondary | ICD-10-CM

## 2022-08-18 DIAGNOSIS — F418 Other specified anxiety disorders: Secondary | ICD-10-CM

## 2022-08-18 DIAGNOSIS — F424 Excoriation (skin-picking) disorder: Secondary | ICD-10-CM

## 2022-08-18 DIAGNOSIS — D638 Anemia in other chronic diseases classified elsewhere: Secondary | ICD-10-CM

## 2022-08-18 DIAGNOSIS — Z8673 Personal history of transient ischemic attack (TIA), and cerebral infarction without residual deficits: Secondary | ICD-10-CM

## 2022-08-18 DIAGNOSIS — R7303 Prediabetes: Secondary | ICD-10-CM

## 2022-08-18 NOTE — Progress Notes (Signed)
Location:  [facility] Room Number: 18/A Place of Service:  SNF [phone]) Provider:  Octavia Heir, NP   [name], MD  Patient Care Team: [name], MD as PCP - General (Internal Medicine) Ngetich, Donalee Citrin, NP as Nurse Practitioner [facility] Medicine)  Extended Emergency Contact Information Primary Emergency Contact: Gordan,[place] Address: [address]          [place], [place] [address] [place] of [place] Home Phone: [phone] Relation: Daughter Secondary Emergency Contact: Iven Finn Address: [address]          [place], [place] [address] [place] of [place] Home Phone: [phone] Relation: Spouse  Code Status:  DNR Goals of care: Advanced Directive information    07/15/2022   12:08 PM  Advanced Directives  Does Patient Have a Medical Advance Directive? Yes  Type of Estate agent of [place];Living will;Out of facility DNR (pink MOST or yellow form)  Does patient want to make changes to medical advance directive? No - Patient declined  Copy of Healthcare Power of Attorney in Chart? Yes - validated most recent copy scanned in chart (See row information)     Chief Complaint  Patient presents with   Medical Management of Chronic Issues         HPI:  Pt is a 88 y.o. female seen today for medical management of chronic diseases.    She currently resides on the skilled nursing unit at [facility]. Past medical conditions include: hypertension, CVA, dementia, GERD, diabetes, CKD stage 3, hyperlipidemia, depression, anxiety and abnormal gait.   Skin picking- no new areas, scarring to face today, remains on triamcinolone prn and Cerave  Dementia- CT head 02/2019 confirmed small white matter disease, BIMS 10/15 08/09/2022> was 12/15 05/2022, no recent behavioral outbursts, ambulates with wheelchair, remains on Aricept and Namenda  Depression/Anxiety- no mood changes, Na+ 143 03/26/2022, remains on Zoloft HTN-  BUN/creat 26/1.0 03/26/2022, remains on amlodipine HLD- LDL 55 03/26/2022, remains on Lipitor GERD- hgb 11.2 (05/22)> was 10.6 (04/14), remains on Protonix Prediabetes- a1c 5.9 03/26/2022, remains on regular diet Anemia- hgb 10.6 03/26/2022, Iron 50, Vit B12 313 03/12/21, ferritin 11 05/03/2022, not on supplement HX CVA- hx acute infarct on left Para median pons, remains on plavix and statin  No recent falls or injuries.   Recent blood pressures: 09/05- 149/74, 124/69 09/04- 131/72  Recent weights: 09/01- 150.1 lbs 08/02- 150.9 lbs 07/03- 150.3 lbs    Past Medical History:  Diagnosis Date   Allergic rhinitis    Anxiety    Balance problem 04/28/2015   Fatigue 04/28/2015   Hearing loss 04/28/2015   history of Fracture of right humerus 04/28/2015   HLD (hyperlipidemia) 03/05/2019   Hyperglycemia    Hyperlipidemia    Hypertension    Memory loss 04/28/2015   04/26/2014 MMSE 28/30. Failed clock drawing. 04/21/15 MMSE 21/30. Failed clock drawing.    Osteoporosis    Past Surgical History:  Procedure Laterality Date   ABDOMINAL HYSTERECTOMY     CATARACT EXTRACTION  2010   ELBOW SURGERY Right 1988   FEMUR FRACTURE SURGERY Right 06/2014   [place], [place] Dr. Dione Housekeeper   HUMERUS FRACTURE SURGERY Right 2015   [place], [place] Dr. Eber Hong   TONSILLECTOMY  [phone]    Allergies  Allergen Reactions   Ace Inhibitors Other (See Comments)    Unknown reaction   Almond Oil Rash   Plum Pulp Rash    Outpatient Encounter Medications as of  08/18/2022  Medication Sig   amLODipine (NORVASC) 5 MG tablet Take 5 mg by mouth daily.    atorvastatin (LIPITOR) 40 MG tablet Take 1 tablet (40 mg total) by mouth daily at 6 PM.   calcium carbonate (OSCAL) 1500 (600 Ca) MG TABS tablet Take 600 mg of elemental calcium by mouth daily with breakfast.   Cholecalciferol (VITAMIN D3) 1000 UNITS CAPS Take 1,000 Units daily by mouth.    clopidogrel (PLAVIX) 75 MG tablet Take 1 tablet (75 mg total) by mouth  daily.   donepezil (ARICEPT) 10 MG tablet TAKE ONE TABLET BY MOUTH DAILY TO PRESERVE MEMORY   Emollient (CERAVE) CREA Apply topically daily.   memantine (NAMENDA) 10 MG tablet Take 1 tablet (10 mg total) by mouth 2 (two) times daily.   Methylcobalamin (B12-ACTIVE) 1 MG CHEW Chew 1 tablet by mouth daily.   Omega-3 Fatty Acids (FISH OIL) 500 MG CAPS Take 1 capsule by mouth daily.   pantoprazole (PROTONIX) 40 MG tablet Take 1 tablet (40 mg total) by mouth daily.   sertraline (ZOLOFT) 25 MG tablet Take 25 mg by mouth daily. Take three tablets =75 mg daily   triamcinolone cream (KENALOG) 0.1 % Apply 1 application. topically 2 (two) times daily as needed (For red area on left arm).   zinc oxide 20 % ointment Apply 1 application topically as needed for irritation. Apply to peri/buttocks after every incontinent episode   No facility-administered encounter medications on file as of 08/18/2022.    Review of Systems  Unable to perform ROS: Dementia    Immunization History  Administered Date(s) Administered   DTaP 05/12/2014   Influenza, High Dose Seasonal PF 09/21/2017, 09/27/2019, 09/16/2020   Influenza,inj,Quad PF,6+ Mos 09/14/2018   Influenza-Unspecified 10/07/2014, 09/11/2015, 09/23/2016, 10/07/2021   Moderna Sars-Covid-2 Vaccination 12/17/2019, 01/14/2020, 10/27/2020, 05/20/2021   PPD Test 10/16/2014   Pneumococcal Conjugate-13 10/20/2017   Pneumococcal Polysaccharide-23 08/20/2011   Unspecified SARS-COV-2 Vaccination 05/26/2022   Pertinent  Health Maintenance Due  Topic Date Due   OPHTHALMOLOGY EXAM  Never done   DEXA SCAN  Never done   FOOT EXAM  05/04/2022   INFLUENZA VACCINE  07/13/2022   HEMOGLOBIN A1C  09/25/2022   URINE MICROALBUMIN  Discontinued      03/05/2019    8:24 PM 03/06/2019    8:00 PM 07/01/2021    4:32 PM 07/07/2022   10:36 AM 07/07/2022   12:59 PM  Fall Risk  Falls in the past year?   1 0 1  Was there an injury with Fall?   0 0 0  Fall Risk Category Calculator    1 0 1  Fall Risk Category   Low Low Low  Patient Fall Risk Level High fall risk High fall risk High fall risk Low fall risk Moderate fall risk  Patient at Risk for Falls Due to   History of fall(s);Impaired balance/gait;Impaired mobility No Fall Risks History of fall(s);Impaired balance/gait;Impaired mobility  Fall risk Follow up   Falls evaluation completed;Education provided;Falls prevention discussed Falls evaluation completed Falls evaluation completed;Education provided;Falls prevention discussed   Functional Status Survey:    Vitals:   08/18/22 1208  BP: (!) 149/74  Pulse: 69  Resp: 17  Temp: (!) 97 F (36.1 C)  SpO2: 96%  Weight: 150 lb 1.6 oz (68.1 kg)  Height: 5\' 2"  (1.575 m)   Body mass index is 27.45 kg/m. Physical Exam Vitals reviewed.  Constitutional:      General: She is not in acute distress. HENT:  Head: Normocephalic.     Right Ear: There is no impacted cerumen.     Left Ear: There is no impacted cerumen.     Nose: Nose normal.     Mouth/Throat:     Mouth: Mucous membranes are moist.  Eyes:     General:        Right eye: No discharge.        Left eye: No discharge.  Cardiovascular:     Rate and Rhythm: Normal rate and regular rhythm.     Pulses: Normal pulses.     Heart sounds: Normal heart sounds.  Pulmonary:     Effort: Pulmonary effort is normal. No respiratory distress.     Breath sounds: Normal breath sounds. No wheezing.  Abdominal:     General: Bowel sounds are normal. There is no distension.     Palpations: Abdomen is soft.     Tenderness: There is no abdominal tenderness.  Musculoskeletal:     Cervical back: Neck supple.     Right lower leg: No edema.     Left lower leg: No edema.  Skin:    General: Skin is warm and dry.     Capillary Refill: Capillary refill takes less than 2 seconds.     Comments: No open lesions to face or extremities  Neurological:     General: No focal deficit present.     Mental Status: She is alert. Mental  status is at baseline.     Motor: Weakness present.     Gait: Gait abnormal.     Comments: wheelchair  Psychiatric:        Mood and Affect: Mood normal.        Behavior: Behavior normal.     Comments: Very pleasant, follows commands, alert to self/familiar face     Labs reviewed: Recent Labs    08/21/21 0000 08/24/21 0000 03/26/22 0000  NA 142 144 143  K 4.0 3.8 4.1  CL 105 107 107  CO2 27* 31* 32*  BUN 29* 26* 26*  CREATININE 1.1 0.9 1.0  CALCIUM 9.2 9.2 8.9   Recent Labs    08/21/21 0000 08/24/21 0000 03/26/22 0000  AST 12* 14 17  ALT 16 16 22   ALKPHOS 74 77 67  ALBUMIN 4.0 4.0 3.8   Recent Labs    08/21/21 0000 08/24/21 0000 03/26/22 0000 05/03/22 0000  WBC 9.2 8.9 8.7 8.2  NEUTROABS 6,348.00 5,180.00 5,394.00  --   HGB 11.3* 11.7* 10.6* 11.2*  HCT 35* 37 33* 35*  PLT 222 238 201 227   Lab Results  Component Value Date   TSH 2.77 08/24/2021   Lab Results  Component Value Date   HGBA1C 5.9 03/26/2022   Lab Results  Component Value Date   CHOL 120 03/26/2022   HDL 51 03/26/2022   LDLCALC 55 03/26/2022   TRIG 62 03/26/2022   CHOLHDL 6.3 03/06/2019    Significant Diagnostic Results in last 30 days:  No results found.  Assessment/Plan 1. Skin-picking disorder - no new lesions - cont Cerave for prevention  2. Severe vascular dementia with other behavioral disturbance (HCC) - no behaviors - ambulates with wheelchair - cont Aricept and Namenda  3. Depression with anxiety - mood stable - cont Zoloft  4. Primary hypertension - controlled with amlodipine  5. Other hyperlipidemia - cont statin  6. Gastroesophageal reflux disease without esophagitis - hgb stable - cont Protonix  7. Prediabetes - A1c stable  8. Anemia, chronic disease -  hgb 10.6 03/26/2022, Iron 50, Vit B12 313 03/12/21, ferritin 11 05/03/2022  9. History of CVA (cerebrovascular accident) - cont Plavix and statin    Family/ staff Communication: plan discussed  with patient and nurse  Labs/tests ordered:  none

## 2022-09-13 ENCOUNTER — Encounter: Payer: Self-pay | Admitting: Orthopedic Surgery

## 2022-09-13 ENCOUNTER — Non-Acute Institutional Stay (SKILLED_NURSING_FACILITY): Payer: PPO | Admitting: Orthopedic Surgery

## 2022-09-13 DIAGNOSIS — F418 Other specified anxiety disorders: Secondary | ICD-10-CM

## 2022-09-13 DIAGNOSIS — I10 Essential (primary) hypertension: Secondary | ICD-10-CM

## 2022-09-13 DIAGNOSIS — E7849 Other hyperlipidemia: Secondary | ICD-10-CM

## 2022-09-13 DIAGNOSIS — K219 Gastro-esophageal reflux disease without esophagitis: Secondary | ICD-10-CM

## 2022-09-13 DIAGNOSIS — F01C18 Vascular dementia, severe, with other behavioral disturbance: Secondary | ICD-10-CM | POA: Diagnosis not present

## 2022-09-13 DIAGNOSIS — F424 Excoriation (skin-picking) disorder: Secondary | ICD-10-CM | POA: Diagnosis not present

## 2022-09-13 DIAGNOSIS — L989 Disorder of the skin and subcutaneous tissue, unspecified: Secondary | ICD-10-CM | POA: Diagnosis not present

## 2022-09-13 DIAGNOSIS — R7303 Prediabetes: Secondary | ICD-10-CM

## 2022-09-13 DIAGNOSIS — U071 COVID-19: Secondary | ICD-10-CM

## 2022-09-13 DIAGNOSIS — Z8673 Personal history of transient ischemic attack (TIA), and cerebral infarction without residual deficits: Secondary | ICD-10-CM

## 2022-09-13 DIAGNOSIS — D638 Anemia in other chronic diseases classified elsewhere: Secondary | ICD-10-CM

## 2022-09-13 NOTE — Progress Notes (Signed)
Location:  [facility] Room Number: 18/A Place of Service:  SNF [phone]) Provider:  Yvonna Alanis, NP   Virgie Dad, MD  Patient Care Team: Virgie Dad, MD as PCP - General (Internal Medicine) Ngetich, Nelda Bucks, NP as Nurse Practitioner [facility])  Extended Emergency Contact Information Primary Emergency Contact: Gordan,[place] Address: [address]          [place], [place] [address] Johnnette Litter of [place] Phone: [phone] Relation: Daughter Secondary Emergency Contact: Verneda Skill Address: [address]          [place], [place] [address] [place] of [place] Phone: [phone] Relation: Spouse  Code Status:   Goals of care: Advanced Directive information    07/15/2022   12:08 PM  Advanced Directives  Does Patient Have a Medical Advance Directive? Yes  Type of Paramedic of [place];Living will;Out of facility DNR (pink MOST or yellow form)  Does patient want to make changes to medical advance directive? No - Patient declined  Copy of [place] in Chart? Yes - validated most recent copy scanned in chart (See row information)     Chief Complaint  Patient presents with   Medical Management of Chronic Issues    HPI:  Pt is a 88 y.o. female seen today for medical management of chronic diseases.    She currently resides on the skilled nursing unit at [facility]. Past medical conditions include: hypertension, CVA, dementia, GERD, diabetes, CKD stage 3, hyperlipidemia, depression, anxiety and abnormal gait.   Skin picking- new area to middle back and 2 areas on face, remains on triamcinolone prn and Cerave  Dementia- CT head 02/2019 confirmed small white matter disease, BIMS 10/15 08/09/2022> was 12/15 05/2022, no recent behavioral outbursts, ambulates with wheelchair, remains on Aricept and Namenda  Depression/Anxiety- no mood changes, Na+ 143 03/26/2022, remains on Zoloft HTN-  BUN/creat 26/1.0 03/26/2022, remains on amlodipine HLD- LDL 55 03/26/2022, remains on Lipitor GERD- hgb 11.2 (05/22)> was 10.6 (04/14), remains on Protonix Prediabetes- a1c 5.9 03/26/2022, remains on regular diet Anemia- hgb 10.6 03/26/2022, Iron 50, Vit B12 313 03/12/21, ferritin 11 05/03/2022, not on supplement HX CVA- hx acute infarct on left Para median pons, remains on plavix and statin Covid- tested positive 09/14, denies long term symptoms  No recent falls/injuries.   Recent weights:  10/02- not recorded  09/01- 150.1 lbs  08/02- 150.9 lbs  07/03- 150.3 lbs  Recent blood pressures:  10/01- 131/71  09/30- 123/66  09/29- 131/72     Past Medical History:  Diagnosis Date   Allergic rhinitis    Anxiety    Balance problem 04/28/2015   Fatigue 04/28/2015   Hearing loss 04/28/2015   history of Fracture of right humerus 04/28/2015   HLD (hyperlipidemia) 03/05/2019   Hyperglycemia    Hyperlipidemia    Hypertension    Memory loss 04/28/2015   04/26/2014 MMSE 28/30. Failed clock drawing. 04/21/15 MMSE 21/30. Failed clock drawing.    Osteoporosis    Past Surgical History:  Procedure Laterality Date   ABDOMINAL HYSTERECTOMY     CATARACT EXTRACTION  2010   ELBOW SURGERY Right 1988   FEMUR FRACTURE SURGERY Right 06/2014   [place], [place] Dr. Dorita Fray   HUMERUS FRACTURE SURGERY Right 2015   [place], [place] Dr. Noemi Chapel   TONSILLECTOMY  [phone]    Allergies  Allergen Reactions   Ace Inhibitors Other (See Comments)    Unknown reaction  Almond Oil Rash   Plum Pulp Rash    Outpatient Encounter Medications as of 09/13/2022  Medication Sig   amLODipine (NORVASC) 5 MG tablet Take 5 mg by mouth daily.    atorvastatin (LIPITOR) 40 MG tablet Take 1 tablet (40 mg total) by mouth daily at 6 PM.   calcium carbonate (OSCAL) 1500 (600 Ca) MG TABS tablet Take 600 mg of elemental calcium by mouth daily with breakfast.   Cholecalciferol (VITAMIN D3) 1000 UNITS CAPS Take 1,000 Units  daily by mouth.    clopidogrel (PLAVIX) 75 MG tablet Take 1 tablet (75 mg total) by mouth daily.   donepezil (ARICEPT) 10 MG tablet TAKE ONE TABLET BY MOUTH DAILY TO PRESERVE MEMORY   Emollient (CERAVE) CREA Apply topically daily.   memantine (NAMENDA) 10 MG tablet Take 1 tablet (10 mg total) by mouth 2 (two) times daily.   Methylcobalamin (B12-ACTIVE) 1 MG CHEW Chew 1 tablet by mouth daily.   Omega-3 Fatty Acids (FISH OIL) 500 MG CAPS Take 1 capsule by mouth daily.   pantoprazole (PROTONIX) 40 MG tablet Take 1 tablet (40 mg total) by mouth daily.   sertraline (ZOLOFT) 25 MG tablet Take 25 mg by mouth daily. Take three tablets =75 mg daily   triamcinolone cream (KENALOG) 0.1 % Apply 1 application. topically 2 (two) times daily as needed (For red area on left arm).   zinc oxide 20 % ointment Apply 1 application topically as needed for irritation. Apply to peri/buttocks after every incontinent episode   No facility-administered encounter medications on file as of 09/13/2022.    Review of Systems  Unable to perform ROS: Dementia    Immunization History  Administered Date(s) Administered   DTaP 05/12/2014   Influenza, High Dose Seasonal PF 09/21/2017, 09/27/2019, 09/16/2020   Influenza,inj,Quad PF,6+ Mos 09/14/2018   Influenza-Unspecified 10/07/2014, 09/11/2015, 09/23/2016, 10/07/2021   Moderna Sars-Covid-2 Vaccination 12/17/2019, 01/14/2020, 10/27/2020, 05/20/2021   PPD Test 10/16/2014   Pneumococcal Conjugate-13 10/20/2017   Pneumococcal Polysaccharide-23 08/20/2011   Unspecified SARS-COV-2 Vaccination 05/26/2022   Pertinent  Health Maintenance Due  Topic Date Due   OPHTHALMOLOGY EXAM  Never done   DEXA SCAN  Never done   FOOT EXAM  05/04/2022   INFLUENZA VACCINE  07/13/2022   HEMOGLOBIN A1C  09/25/2022      03/05/2019    8:24 PM 03/06/2019    8:00 PM 07/01/2021    4:32 PM 07/07/2022   10:36 AM 07/07/2022   12:59 PM  Fall Risk  Falls in the past year?   1 0 1  Was there an  injury with Fall?   0 0 0  Fall Risk Category Calculator   1 0 1  Fall Risk Category   Low Low Low  Patient Fall Risk Level High fall risk High fall risk High fall risk Low fall risk Moderate fall risk  Patient at Risk for Falls Due to   History of fall(s);Impaired balance/gait;Impaired mobility No Fall Risks History of fall(s);Impaired balance/gait;Impaired mobility  Fall risk Follow up   Falls evaluation completed;Education provided;Falls prevention discussed Falls evaluation completed Falls evaluation completed;Education provided;Falls prevention discussed   Functional Status Survey:    Vitals:   09/13/22 1432  BP: 131/71  Pulse: 70  Resp: 16  Temp: (!) 97.2 F (36.2 C)  SpO2: 94%  Weight: 150 lb 1.6 oz (68.1 kg)  Height: 5\' 2"  (1.575 m)   Body mass index is 27.45 kg/m. Physical Exam Vitals reviewed.  Constitutional:  General: She is not in acute distress. HENT:     Head: Normocephalic.     Right Ear: There is no impacted cerumen.     Left Ear: There is no impacted cerumen.     Nose: Nose normal.     Mouth/Throat:     Mouth: Mucous membranes are moist.  Eyes:     General:        Right eye: No discharge.        Left eye: No discharge.  Cardiovascular:     Rate and Rhythm: Normal rate and regular rhythm.     Pulses:          Dorsalis pedis pulses are 1+ on the right side and 1+ on the left side.       Posterior tibial pulses are 1+ on the right side and 1+ on the left side.     Heart sounds: Normal heart sounds.  Pulmonary:     Effort: Pulmonary effort is normal. No respiratory distress.     Breath sounds: Normal breath sounds. No wheezing.  Abdominal:     General: Bowel sounds are normal. There is no distension.     Palpations: Abdomen is soft.     Tenderness: There is no abdominal tenderness.  Musculoskeletal:     Cervical back: Neck supple.     Right lower leg: No edema.     Left lower leg: No edema.  Feet:     Right foot:     Protective Sensation: 9  sites tested.  9 sites sensed.     Skin integrity: Dry skin present.     Toenail Condition: Fungal disease present.    Left foot:     Protective Sensation: 9 sites tested.  9 sites sensed.     Skin integrity: Dry skin present.     Toenail Condition: Fungal disease present. Skin:    General: Skin is warm and dry.     Capillary Refill: Capillary refill takes less than 2 seconds.     Findings: Lesion present.     Comments: < 0.5 cm open lesion to left nose, CDI, no drainage/erythema/tenderness. Approx 2-3 cm skin lesion to middle back near bra line, mild erythema/tenderness, no drainage.   Neurological:     General: No focal deficit present.     Mental Status: She is alert. Mental status is at baseline.     Motor: Weakness present.     Gait: Gait abnormal.     Comments: wheelchair  Psychiatric:        Mood and Affect: Mood normal.        Behavior: Behavior normal.     Comments: Very pleasant, follows commands, alert to self/person     Labs reviewed: Recent Labs    03/26/22 0000  NA 143  K 4.1  CL 107  CO2 32*  BUN 26*  CREATININE 1.0  CALCIUM 8.9   Recent Labs    03/26/22 0000  AST 17  ALT 22  ALKPHOS 67  ALBUMIN 3.8   Recent Labs    03/26/22 0000 05/03/22 0000  WBC 8.7 8.2  NEUTROABS 5,394.00  --   HGB 10.6* 11.2*  HCT 33* 35*  PLT 201 227   Lab Results  Component Value Date   TSH 2.77 08/24/2021   Lab Results  Component Value Date   HGBA1C 5.9 03/26/2022   Lab Results  Component Value Date   CHOL 120 03/26/2022   HDL 51 03/26/2022   LDLCALC 55  03/26/2022   TRIG 62 03/26/2022   CHOLHDL 6.3 03/06/2019    Significant Diagnostic Results in last 30 days:  No results found.  Assessment/Plan 1. Skin lesion of back - suspect from skin picking - approx 2-3 cm lesion to middle back - mild erythema/tenderness - start mupirocin 2% ointment BID x 7 days  2. Skin-picking disorder - new lesion to face - cont Cerave  3. Severe vascular dementia  with other behavioral disturbance (HCC) - no behaviors - cont Aricept and Namenda  4. Depression with anxiety - no mood changes - cont Zoloft - cmp  5. Primary hypertension - controlled with amlodipine - cmp  6. Other hyperlipidemia - cont statin  7. Gastroesophageal reflux disease without esophagitis - cont Protonix - cbc/diff  8. Prediabetes - A1c stable  9. Anemia, chronic disease - hgb 10.6 03/2022 - cbc/diff  10. History of CVA (cerebrovascular accident) - cont Plavix and statin  11. COVID-19 - resolved - no long term symptoms    Family/ staff Communication: plan discussed with patient and nurse  Labs/tests ordered:  cbc/diff, cmp 09/16/2022

## 2022-09-16 LAB — BASIC METABOLIC PANEL
BUN: 22 — AB (ref 4–21)
CO2: 32 — AB (ref 13–22)
Chloride: 105 (ref 99–108)
Creatinine: 1 (ref 0.5–1.1)
Glucose: 80
Potassium: 4.2 mEq/L (ref 3.5–5.1)
Sodium: 141 (ref 137–147)

## 2022-09-16 LAB — COMPREHENSIVE METABOLIC PANEL
Albumin: 3.5 (ref 3.5–5.0)
Calcium: 9 (ref 8.7–10.7)
Globulin: 2.3
eGFR: 50

## 2022-09-16 LAB — HEPATIC FUNCTION PANEL
ALT: 14 U/L (ref 7–35)
AST: 12 — AB (ref 13–35)
Alkaline Phosphatase: 69 (ref 25–125)
Bilirubin, Total: 0.3

## 2022-09-16 LAB — CBC AND DIFFERENTIAL
HCT: 35 — AB (ref 36–46)
Hemoglobin: 10.9 — AB (ref 12.0–16.0)
Neutrophils Absolute: 4864
Platelets: 221 10*3/uL (ref 150–400)
WBC: 8.4

## 2022-09-16 LAB — CBC: RBC: 3.69 — AB (ref 3.87–5.11)

## 2022-09-16 LAB — HEMOGLOBIN A1C: Hemoglobin A1C: 5.9

## 2022-10-14 ENCOUNTER — Non-Acute Institutional Stay (SKILLED_NURSING_FACILITY): Payer: PPO | Admitting: Internal Medicine

## 2022-10-14 ENCOUNTER — Encounter: Payer: Self-pay | Admitting: Internal Medicine

## 2022-10-14 DIAGNOSIS — I10 Essential (primary) hypertension: Secondary | ICD-10-CM

## 2022-10-14 DIAGNOSIS — F424 Excoriation (skin-picking) disorder: Secondary | ICD-10-CM | POA: Diagnosis not present

## 2022-10-14 DIAGNOSIS — F01C18 Vascular dementia, severe, with other behavioral disturbance: Secondary | ICD-10-CM

## 2022-10-14 DIAGNOSIS — E7849 Other hyperlipidemia: Secondary | ICD-10-CM

## 2022-10-14 DIAGNOSIS — F418 Other specified anxiety disorders: Secondary | ICD-10-CM

## 2022-10-14 DIAGNOSIS — K219 Gastro-esophageal reflux disease without esophagitis: Secondary | ICD-10-CM

## 2022-10-14 DIAGNOSIS — Z8673 Personal history of transient ischemic attack (TIA), and cerebral infarction without residual deficits: Secondary | ICD-10-CM

## 2022-10-14 DIAGNOSIS — D638 Anemia in other chronic diseases classified elsewhere: Secondary | ICD-10-CM

## 2022-10-14 NOTE — Progress Notes (Signed)
Location:  [facility] of Service:  SNF [phone]) Provider:  Virgie Dad, MD   Virgie Dad, MD  Patient Care Team: Virgie Dad, MD as PCP - General (Internal Medicine) Ngetich, Nelda Bucks, NP as Nurse Practitioner [facility])  Extended Emergency Contact Information Primary Emergency Contact: Gordan,[place] Address: [address]          [place], [place] [address] Johnnette Litter of [place] Phone: [phone] Relation: Daughter Secondary Emergency Contact: Verneda Skill Address: [address]          [place], [place] [address] [place] of [place] Phone: [phone] Relation: Spouse  Code Status:  DNR Goals of care: Advanced Directive information    10/14/2022    3:39 PM  Advanced Directives  Does Patient Have a Medical Advance Directive? Yes  Type of Paramedic of [place];Out of facility DNR (pink MOST or yellow form);Living will  Does patient want to make changes to medical advance directive? No - Patient declined  Copy of [place] in Chart? Yes - validated most recent copy scanned in chart (See row information)     Chief Complaint  Patient presents with   Medical Management of Chronic Issues    Routine Visit     HPI:  Pt is a 88 y.o. female seen today for medical management of chronic diseases.    Lives in SNF in [place]  Patient has history of dementia, anxiety, With Skin Picking  hypertension and hyperlipidemia  History of  Acute CVA  MRI showed Acute infarction on Left Para median Pons     She is stable. No new Nursing issues. No Behavior issues Still Picks on her skin but less then before Stays in her room most of the time Comes out in her Wheelchair No Falls Has lost some weight  Wt Readings from Last 3 Encounters:  10/14/22 142 lb 4.8 oz (64.5 kg)  09/13/22 150 lb 1.6 oz (68.1 kg)  08/18/22 150 lb 1.6 oz (68.1 kg)    Past Medical History:  Diagnosis Date    Allergic rhinitis    Anxiety    Balance problem 04/28/2015   Fatigue 04/28/2015   Hearing loss 04/28/2015   history of Fracture of right humerus 04/28/2015   HLD (hyperlipidemia) 03/05/2019   Hyperglycemia    Hyperlipidemia    Hypertension    Memory loss 04/28/2015   04/26/2014 MMSE 28/30. Failed clock drawing. 04/21/15 MMSE 21/30. Failed clock drawing.    Osteoporosis    Past Surgical History:  Procedure Laterality Date   ABDOMINAL HYSTERECTOMY     CATARACT EXTRACTION  2010   ELBOW SURGERY Right 1988   FEMUR FRACTURE SURGERY Right 06/2014   [place], [place] Dr. Dorita Fray   HUMERUS FRACTURE SURGERY Right 2015   [place], [place] Dr. Noemi Chapel   TONSILLECTOMY  [phone]    Allergies  Allergen Reactions   Ace Inhibitors Other (See Comments)    Unknown reaction   Almond Oil Rash   Plum Pulp Rash    Outpatient Encounter Medications as of 10/14/2022  Medication Sig   amLODipine (NORVASC) 5 MG tablet Take 5 mg by mouth daily.    atorvastatin (LIPITOR) 40 MG tablet Take 1 tablet (40 mg total) by mouth daily at 6 PM.   calcium carbonate (OSCAL) 1500 (600 Ca) MG TABS tablet Take 600 mg of elemental calcium by mouth daily with breakfast.   Cholecalciferol (VITAMIN D3) 1000 UNITS CAPS Take 1,000  Units daily by mouth.    clopidogrel (PLAVIX) 75 MG tablet Take 1 tablet (75 mg total) by mouth daily.   donepezil (ARICEPT) 10 MG tablet TAKE ONE TABLET BY MOUTH DAILY TO PRESERVE MEMORY   Emollient (CERAVE) CREA Apply topically daily.   memantine (NAMENDA) 10 MG tablet Take 1 tablet (10 mg total) by mouth 2 (two) times daily.   Methylcobalamin (B12-ACTIVE) 1 MG CHEW Chew 1 tablet by mouth daily.   Omega-3 Fatty Acids (FISH OIL) 500 MG CAPS Take 1 capsule by mouth daily.   pantoprazole (PROTONIX) 40 MG tablet Take 1 tablet (40 mg total) by mouth daily.   sertraline (ZOLOFT) 25 MG tablet Take 25 mg by mouth daily. Take three tablets =75 mg daily   triamcinolone cream (KENALOG) 0.1 % Apply 1  application. topically 2 (two) times daily as needed (For red area on left arm).   zinc oxide 20 % ointment Apply 1 application topically as needed for irritation. Apply to peri/buttocks after every incontinent episode   No facility-administered encounter medications on file as of 10/14/2022.    Review of Systems  Unable to perform ROS: Dementia    Immunization History  Administered Date(s) Administered   DTaP 05/12/2014   Fluad Quad(high Dose 65+) 10/06/2022   Influenza, High Dose Seasonal PF 09/21/2017, 09/27/2019, 09/16/2020   Influenza,inj,Quad PF,6+ Mos 09/14/2018   Influenza-Unspecified 10/07/2014, 09/11/2015, 09/23/2016, 10/07/2021   Moderna Sars-Covid-2 Vaccination 12/17/2019, 01/14/2020, 10/27/2020, 05/20/2021   PPD Test 10/16/2014   Pneumococcal Conjugate-13 10/20/2017   Pneumococcal Polysaccharide-23 08/20/2011   Unspecified SARS-COV-2 Vaccination 05/26/2022   Pertinent  Health Maintenance Due  Topic Date Due   OPHTHALMOLOGY EXAM  Never done   DEXA SCAN  09/14/2023 (Originally 03/21/1993)   HEMOGLOBIN A1C  03/18/2023   FOOT EXAM  09/14/2023   INFLUENZA VACCINE  Completed      03/05/2019    8:24 PM 03/06/2019    8:00 PM 07/01/2021    4:32 PM 07/07/2022   10:36 AM 07/07/2022   12:59 PM  Fall Risk  Falls in the past year?   1 0 1  Was there an injury with Fall?   0 0 0  Fall Risk Category Calculator   1 0 1  Fall Risk Category   Low Low Low  Patient Fall Risk Level High fall risk High fall risk High fall risk Low fall risk Moderate fall risk  Patient at Risk for Falls Due to   History of fall(s);Impaired balance/gait;Impaired mobility No Fall Risks History of fall(s);Impaired balance/gait;Impaired mobility  Fall risk Follow up   Falls evaluation completed;Education provided;Falls prevention discussed Falls evaluation completed Falls evaluation completed;Education provided;Falls prevention discussed   Functional Status Survey:    Vitals:   10/14/22 1533  BP: (!)  140/75  Pulse: 77  Resp: 16  Temp: (!) 97.3 F (36.3 C)  TempSrc: Temporal  SpO2: 92%  Weight: 142 lb 4.8 oz (64.5 kg)  Height: 5\' 2"  (1.575 m)   Body mass index is 26.03 kg/m. Physical Exam Vitals reviewed.  Constitutional:      Appearance: Normal appearance.  HENT:     Head: Normocephalic.     Nose: Nose normal.     Mouth/Throat:     Mouth: Mucous membranes are moist.     Pharynx: Oropharynx is clear.  Eyes:     Pupils: Pupils are equal, round, and reactive to light.  Cardiovascular:     Rate and Rhythm: Normal rate and regular rhythm.  Pulses: Normal pulses.     Heart sounds: Normal heart sounds. No murmur heard. Pulmonary:     Effort: Pulmonary effort is normal.     Breath sounds: Normal breath sounds.  Abdominal:     General: Abdomen is flat. Bowel sounds are normal.     Palpations: Abdomen is soft.  Musculoskeletal:        General: No swelling.     Cervical back: Neck supple.  Skin:    General: Skin is warm.  Neurological:     General: No focal deficit present.     Mental Status: She is alert.  Psychiatric:        Mood and Affect: Mood normal.        Thought Content: Thought content normal.     Labs reviewed: Recent Labs    03/26/22 0000 09/16/22 0000  NA 143 141  K 4.1 4.2  CL 107 105  CO2 32* 32*  BUN 26* 22*  CREATININE 1.0 1.0  CALCIUM 8.9 9.0   Recent Labs    03/26/22 0000 09/16/22 0000  AST 17 12*  ALT 22 14  ALKPHOS 67 69  ALBUMIN 3.8 3.5   Recent Labs    03/26/22 0000 05/03/22 0000 09/16/22 0000  WBC 8.7 8.2 8.4  NEUTROABS 5,394.00  --  4,864.00  HGB 10.6* 11.2* 10.9*  HCT 33* 35* 35*  PLT 201 227 221   Lab Results  Component Value Date   TSH 2.77 08/24/2021   Lab Results  Component Value Date   HGBA1C 5.9 09/16/2022   Lab Results  Component Value Date   CHOL 120 03/26/2022   HDL 51 03/26/2022   LDLCALC 55 03/26/2022   TRIG 62 03/26/2022   CHOLHDL 6.3 03/06/2019    Significant Diagnostic Results in last  30 days:  No results found.  Assessment/Plan 1. Severe vascular dementia with other behavioral disturbance (HCC) Continues to be stable Has some weight loss but dietary is involved Will follow Needs help with her Transfers Also on Namenda and Aricept Plavix and statin 2. Skin-picking disorder Stable oN Zoloft  3. Depression with anxiety On Zoloft  4. Primary hypertension On Norvasc  5. Other hyperlipidemia Statin LDL stable  6. Gastroesophageal reflux disease without esophagitis On Protonix  7. Anemia, chronic disease No Further work up due to her dementia  Low Iron stores But HGB acceptable Will watch for now   8. History of CVA (cerebrovascular accident) On Plavix and statin    Family/ staff Communication:   Labs/tests ordered:

## 2022-10-25 ENCOUNTER — Non-Acute Institutional Stay (SKILLED_NURSING_FACILITY): Payer: PPO | Admitting: Orthopedic Surgery

## 2022-10-25 ENCOUNTER — Encounter: Payer: Self-pay | Admitting: Orthopedic Surgery

## 2022-10-25 DIAGNOSIS — F424 Excoriation (skin-picking) disorder: Secondary | ICD-10-CM

## 2022-10-25 DIAGNOSIS — S99922A Unspecified injury of left foot, initial encounter: Secondary | ICD-10-CM

## 2022-10-25 DIAGNOSIS — F01C18 Vascular dementia, severe, with other behavioral disturbance: Secondary | ICD-10-CM | POA: Diagnosis not present

## 2022-10-25 NOTE — Progress Notes (Signed)
Location:   [place] Room Number: 18-A Place of Service:  SNF [phone]) Provider:  Windell Moulding, NP  PCP: Virgie Dad, MD  Patient Care Team: Virgie Dad, MD as PCP - General (Internal Medicine) Ngetich, Nelda Bucks, NP as Nurse Practitioner [facility] Medicine)  Extended Emergency Contact Information Primary Emergency Contact: Gordan,[place] Address: [address]          [place], [place] [address] Johnnette Litter of [place] Phone: [phone] Relation: Daughter Secondary Emergency Contact: Verneda Skill Address: [address]          [place], [place] [address] [place] of [place] Phone: [phone] Relation: Spouse  Code Status:  DNR Goals of care: Advanced Directive information    10/25/2022   10:56 AM  Advanced Directives  Does Patient Have a Medical Advance Directive? Yes  Type of Paramedic of [place];Living will;Out of facility DNR (pink MOST or yellow form)  Does patient want to make changes to medical advance directive? No - Patient declined  Copy of [place] in Chart? Yes - validated most recent copy scanned in chart (See row information)     Chief Complaint  Patient presents with   Acute Visit    Left great toe injury.     HPI:  Pt is a 88 y.o. female seen today for an acute visit for left great toe injury.   She currently resides on the skilled nursing unit at [facility]. PMH: hypertension, CVA, dementia, GERD, diabetes, CKD stage 3, hyperlipidemia, depression, anxiety and abnormal gait.   Left great toe- nursing reports she cut her toenails over the weekend and cut too far exposing nail bed, they have been applying antibiotic ointment, she denies pain today Skin picking- new open lesion to right forearm, she denies pain today, remains on Zoloft, triamcinolone prn and Cerave  Dementia- CT head 02/2019 confirmed small white matter disease, BIMS 10/15 08/09/2022> was 12/15 05/2022,  no recent behavioral outbursts, ambulates with wheelchair, remains on Aricept and Namenda   Past Medical History:  Diagnosis Date   Allergic rhinitis    Anxiety    Balance problem 04/28/2015   Fatigue 04/28/2015   Hearing loss 04/28/2015   history of Fracture of right humerus 04/28/2015   HLD (hyperlipidemia) 03/05/2019   Hyperglycemia    Hyperlipidemia    Hypertension    Memory loss 04/28/2015   04/26/2014 MMSE 28/30. Failed clock drawing. 04/21/15 MMSE 21/30. Failed clock drawing.    Osteoporosis    Past Surgical History:  Procedure Laterality Date   ABDOMINAL HYSTERECTOMY     CATARACT EXTRACTION  2010   ELBOW SURGERY Right 1988   FEMUR FRACTURE SURGERY Right 06/2014   [place], [place] Dr. Dorita Fray   HUMERUS FRACTURE SURGERY Right 2015   [place], [place] Dr. Noemi Chapel   TONSILLECTOMY  [phone]    Allergies  Allergen Reactions   Ace Inhibitors Other (See Comments)    Unknown reaction   Almond Oil Rash   Plum Pulp Rash    Allergies as of 10/25/2022       Reactions   Ace Inhibitors Other (See Comments)   Unknown reaction   Almond Oil Rash   Plum Pulp Rash        Medication List        Accurate as of October 25, 2022  2:44 PM. If you have any questions, ask your nurse or doctor.  amLODipine 5 MG tablet Commonly known as: NORVASC Take 5 mg by mouth daily.   atorvastatin 40 MG tablet Commonly known as: LIPITOR Take 1 tablet (40 mg total) by mouth daily at 6 PM.   B12-Active 1 MG Chew Generic drug: Methylcobalamin Chew 1 tablet by mouth 3 (three) times a week. Monday, Wednesday, and Friday.   calcium carbonate 1500 (600 Ca) MG Tabs tablet Commonly known as: OSCAL Take 600 mg of elemental calcium by mouth daily with breakfast.   CeraVe Crea Apply 1 Application topically daily. Apply to back, face, legs, and arms.   clopidogrel 75 MG tablet Commonly known as: PLAVIX Take 75 mg by mouth at bedtime. What changed: Another medication with the same  name was removed. Continue taking this medication, and follow the directions you see here. Changed by: Octavia Heir, NP   donepezil 10 MG tablet Commonly known as: ARICEPT TAKE ONE TABLET BY MOUTH DAILY TO PRESERVE MEMORY   Fish Oil 500 MG Caps Take 1 capsule by mouth at bedtime.   memantine 10 MG tablet Commonly known as: Namenda Take 1 tablet (10 mg total) by mouth 2 (two) times daily.   pantoprazole 40 MG tablet Commonly known as: Protonix Take 1 tablet (40 mg total) by mouth daily.   sertraline 25 MG tablet Commonly known as: ZOLOFT Take 75 mg by mouth daily.   triamcinolone cream 0.1 % Commonly known as: KENALOG Apply 1 application. topically 2 (two) times daily as needed (For red area on left arm).   triamcinolone cream 0.1 % Commonly known as: KENALOG Apply 1 Application topically at bedtime as needed.   Vitamin D3 25 MCG (1000 UT) Caps Take 1,000 Units daily by mouth.   zinc oxide 20 % ointment Apply 1 application topically as needed for irritation. Apply to peri/buttocks after every incontinent episode        Review of Systems  Unable to perform ROS: Dementia    Immunization History  Administered Date(s) Administered   DTaP 05/12/2014   Fluad Quad(high Dose 65+) 10/06/2022   Influenza, High Dose Seasonal PF 09/21/2017, 09/27/2019, 09/16/2020   Influenza,inj,Quad PF,6+ Mos 09/14/2018   Influenza-Unspecified 10/07/2014, 09/11/2015, 09/23/2016, 10/07/2021   Moderna Sars-Covid-2 Vaccination 12/17/2019, 01/14/2020, 10/27/2020, 05/20/2021   PPD Test 10/16/2014   Pneumococcal Conjugate-13 10/20/2017   Pneumococcal Polysaccharide-23 08/20/2011   Unspecified SARS-COV-2 Vaccination 05/26/2022   Pertinent  Health Maintenance Due  Topic Date Due   OPHTHALMOLOGY EXAM  Never done   DEXA SCAN  09/14/2023 (Originally 03/21/1993)   HEMOGLOBIN A1C  03/18/2023   FOOT EXAM  09/14/2023   INFLUENZA VACCINE  Completed      03/05/2019    8:24 PM 03/06/2019    8:00 PM  07/01/2021    4:32 PM 07/07/2022   10:36 AM 07/07/2022   12:59 PM  Fall Risk  Falls in the past year?   1 0 1  Was there an injury with Fall?   0 0 0  Fall Risk Category Calculator   1 0 1  Fall Risk Category   Low Low Low  Patient Fall Risk Level High fall risk High fall risk High fall risk Low fall risk Moderate fall risk  Patient at Risk for Falls Due to   History of fall(s);Impaired balance/gait;Impaired mobility No Fall Risks History of fall(s);Impaired balance/gait;Impaired mobility  Fall risk Follow up   Falls evaluation completed;Education provided;Falls prevention discussed Falls evaluation completed Falls evaluation completed;Education provided;Falls prevention discussed   Functional Status Survey:  Vitals:   10/25/22 1050  BP: 122/66  Pulse: 75  Resp: 20  Temp: 97.6 F (36.4 C)  SpO2: 93%  Weight: 142 lb 4.8 oz (64.5 kg)  Height: 5\' 2"  (1.575 m)   Body mass index is 26.03 kg/m. Physical Exam Vitals reviewed.  Constitutional:      General: She is not in acute distress. HENT:     Head: Normocephalic.  Eyes:     General:        Right eye: No discharge.        Left eye: No discharge.  Cardiovascular:     Rate and Rhythm: Normal rate and regular rhythm.     Pulses: Normal pulses.     Heart sounds: Normal heart sounds.  Pulmonary:     Effort: Pulmonary effort is normal. No respiratory distress.     Breath sounds: Normal breath sounds. No wheezing.  Abdominal:     General: Bowel sounds are normal. There is no distension.     Palpations: Abdomen is soft.     Tenderness: There is no abdominal tenderness.  Musculoskeletal:     Cervical back: Neck supple.     Right lower leg: No edema.     Left lower leg: No edema.  Skin:    General: Skin is warm and dry.     Capillary Refill: Capillary refill takes less than 2 seconds.     Findings: Lesion present.     Comments: Left great toe with nail bed exposure towards left side, no erythema/swelling/tenderness/warmth,  no drainage. Approx < 1 cm open lesion to right anterior forearm, CDI, granulation tissue present in wound bed, drainage bloody, no erythema/swelling/tenderness  Neurological:     General: No focal deficit present.     Mental Status: She is alert. Mental status is at baseline.     Motor: Weakness present.     Gait: Gait abnormal.     Comments: wheelchair  Psychiatric:        Mood and Affect: Mood normal.        Behavior: Behavior normal.     Labs reviewed: Recent Labs    03/26/22 0000 09/16/22 0000  NA 143 141  K 4.1 4.2  CL 107 105  CO2 32* 32*  BUN 26* 22*  CREATININE 1.0 1.0  CALCIUM 8.9 9.0   Recent Labs    03/26/22 0000 09/16/22 0000  AST 17 12*  ALT 22 14  ALKPHOS 67 69  ALBUMIN 3.8 3.5   Recent Labs    03/26/22 0000 05/03/22 0000 09/16/22 0000  WBC 8.7 8.2 8.4  NEUTROABS 5,394.00  --  4,864.00  HGB 10.6* 11.2* 10.9*  HCT 33* 35* 35*  PLT 201 227 221   Lab Results  Component Value Date   TSH 2.77 08/24/2021   Lab Results  Component Value Date   HGBA1C 5.9 09/16/2022   Lab Results  Component Value Date   CHOL 120 03/26/2022   HDL 51 03/26/2022   LDLCALC 55 03/26/2022   TRIG 62 03/26/2022   CHOLHDL 6.3 03/06/2019    Significant Diagnostic Results in last 30 days:  No results found.  Assessment/Plan 1. Injury of left great toe, initial encounter - used nail clippers and exposed nail bed of left great toe - asymptomatic, no sign of infection - cleanse area with saline daily, cover with antibiotic ointment and cover with bandage daily x 10 days   2. Skin-picking disorder - ongoing - new open to right forearm, CDI, no  infection - cleanse area with saline daily, cover with antibiotic ointment and cover with bandage daily x 10 days   3. Severe vascular dementia with other behavioral disturbance (HCC) - no behaviors except skin picking - ambulates with wheelchair - cont aricept and namenda     Family/ staff Communication: plan discussed  with patient and nurse  Labs/tests ordered:  none

## 2022-11-13 ENCOUNTER — Emergency Department (HOSPITAL_COMMUNITY): Payer: PPO

## 2022-11-13 ENCOUNTER — Other Ambulatory Visit: Payer: Self-pay

## 2022-11-13 ENCOUNTER — Emergency Department (HOSPITAL_COMMUNITY)
Admission: EM | Admit: 2022-11-13 | Discharge: 2022-11-14 | Disposition: A | Discharge status: Skilled Nursing Facility | Payer: PPO | Attending: Emergency Medicine | Admitting: Emergency Medicine

## 2022-11-13 DIAGNOSIS — I10 Essential (primary) hypertension: Secondary | ICD-10-CM | POA: Diagnosis not present

## 2022-11-13 DIAGNOSIS — W19XXXA Unspecified fall, initial encounter: Secondary | ICD-10-CM | POA: Diagnosis not present

## 2022-11-13 DIAGNOSIS — I6782 Cerebral ischemia: Secondary | ICD-10-CM | POA: Insufficient documentation

## 2022-11-13 DIAGNOSIS — M79604 Pain in right leg: Secondary | ICD-10-CM | POA: Insufficient documentation

## 2022-11-13 NOTE — ED Notes (Signed)
ED Provider at bedside. 

## 2022-11-13 NOTE — ED Triage Notes (Signed)
Per EMS, Pt, from [facility], presents after an unwitnessed fall.  Facility reports Pt is unable to bear weight on R leg.  Pt denies complaints.  Pt reports she was unable to stand because she was "wedged in the bathroom."  Pt reports losing balance and falling after standing up from the commode.    Pt denies pain on palpation.  Pt is able to bend both knees and raise both legs w/o pain.

## 2022-11-13 NOTE — ED Notes (Signed)
Attempted to ambulate patient, she was able to stand with assist.  She is noted to not want to bear weight on the right leg so we returned her to bed.  She admitted that this was difficult for her.  Daughter states this gait is not normal.  Patient does not ambulate in the nursing home.  She uses a wheelchair to ambulate.

## 2022-11-13 NOTE — ED Provider Notes (Signed)
Emergency Department Provider Note   I have reviewed the triage vital signs and the nursing notes.   HISTORY  Chief Complaint Fall   HPI Ana [name] is a 88 y.o. female with PMH of HTN, HLD, and osteoporosis presents to the ED from [facility] after an unwitnessed fall. Patient fell in the bathroom and staff reports she was unable to stand on the right leg after fall.  Patient states that she is having no pain in her legs or anywhere else.  She feels like she lost her balance and fell.  She does not recall losing consciousness or having chest discomfort.  Past Medical History:  Diagnosis Date   Allergic rhinitis    Anxiety    Balance problem 04/28/2015   Fatigue 04/28/2015   Hearing loss 04/28/2015   history of Fracture of right humerus 04/28/2015   HLD (hyperlipidemia) 03/05/2019   Hyperglycemia    Hyperlipidemia    Hypertension    Memory loss 04/28/2015   04/26/2014 MMSE 28/30. Failed clock drawing. 04/21/15 MMSE 21/30. Failed clock drawing.    Osteoporosis     Review of Systems  Constitutional: No fever/chills Cardiovascular: Denies chest pain. Respiratory: Denies shortness of breath. Gastrointestinal: No abdominal pain. Musculoskeletal: Negative for back pain. Positive right leg pain.  Skin: Negative for rash.   ____________________________________________   PHYSICAL EXAM:  VITAL SIGNS: ED Triage Vitals  Enc Vitals Group     BP 11/13/22 1847 127/64     Pulse Rate 11/13/22 1847 70     Resp 11/13/22 1847 16     Temp 11/13/22 1847 97.6 F (36.4 C)     Temp Source 11/13/22 1847 Oral     SpO2 11/13/22 1843 97 %   Constitutional: Alert and oriented. Well appearing and in no acute distress. Eyes: Conjunctivae are normal.  Head: Atraumatic. Nose: No congestion/rhinnorhea. Mouth/Throat: Mucous membranes are moist.  Neck: No stridor. No cervical spine tenderness to palpation. Cardiovascular: Normal rate, regular rhythm. Good peripheral circulation.  Grossly normal heart sounds.   Respiratory: Normal respiratory effort.  No retractions. Lungs CTAB. Gastrointestinal: Soft and nontender. No distention.  Musculoskeletal: No lower extremity tenderness nor edema. No gross deformities of extremities.  Normal range of motion of the bilateral hips, knees, ankles.  No point tenderness.  Compartments soft. Neurologic:  Normal speech and language. No gross focal neurologic deficits are appreciated.  Skin:  Skin is warm, dry and intact. No rash noted.  ____________________________________________  RADIOLOGY  No results found.  ____________________________________________   PROCEDURES  Procedure(s) performed:   Procedures  None  ____________________________________________   INITIAL IMPRESSION / ASSESSMENT AND PLAN / ED COURSE  Pertinent labs & imaging results that were available during my care of the patient were reviewed by me and considered in my medical decision making (see chart for details).   This patient is Presenting for Evaluation of fall, which does require a range of treatment options, and is a complaint that involves a high risk of morbidity and mortality.  The Differential Diagnoses  includes subdural hematoma, epidural hematoma, acute concussion, traumatic subarachnoid hemorrhage, cerebral contusions, etc.   Critical Interventions-    Medications - No data to display  Reassessment after intervention:     I did obtain Additional Historical Information from daughter at bedside.    Radiologic Tests Ordered, included ***. I independently interpreted the images and agree with radiology interpretation.    Medical Decision Making: Summary:  Patient presents emergency department for evaluation after mechanical fall  at her nursing facility.  She has full range of motion of the bilateral lower extremities without point tenderness, abrasion/laceration.  Compartments soft.  She appears very well.  Fall was unwitnessed and  in this case will obtain imaging of the head and C-spine along with plain films of the right hip and chest x-ray.  Reevaluation with update and discussion with   ***Considered admission***  Disposition:   ____________________________________________  FINAL CLINICAL IMPRESSION(S) / ED DIAGNOSES  Final diagnoses:  None     NEW OUTPATIENT MEDICATIONS STARTED DURING THIS VISIT:  New Prescriptions   No medications on file    Note:  This document was prepared using Dragon voice recognition software and may include unintentional dictation errors.  Alona Bene, MD, [facility] Emergency Medicine

## 2022-11-13 NOTE — ED Notes (Signed)
[facility] called pt is 4th on the list

## 2022-11-14 NOTE — ED Notes (Signed)
[facility] is here to transport patient back to [facility].  [employer] form given to the [employer].  Patient clothing and her blanket also sent with her

## 2022-11-14 NOTE — ED Notes (Signed)
Patient is resting with eyes closed.  No s/sx of distress.  

## 2022-11-14 NOTE — ED Notes (Signed)
Patient is resting.  We continue to wait on [facility] for transport back to her nursing home.

## 2022-11-15 ENCOUNTER — Non-Acute Institutional Stay (SKILLED_NURSING_FACILITY): Payer: PPO | Admitting: Orthopedic Surgery

## 2022-11-15 ENCOUNTER — Encounter: Payer: Self-pay | Admitting: Orthopedic Surgery

## 2022-11-15 DIAGNOSIS — W19XXXA Unspecified fall, initial encounter: Secondary | ICD-10-CM

## 2022-11-15 DIAGNOSIS — F01C18 Vascular dementia, severe, with other behavioral disturbance: Secondary | ICD-10-CM | POA: Diagnosis not present

## 2022-11-15 DIAGNOSIS — M79661 Pain in right lower leg: Secondary | ICD-10-CM

## 2022-11-15 DIAGNOSIS — F424 Excoriation (skin-picking) disorder: Secondary | ICD-10-CM | POA: Diagnosis not present

## 2022-11-15 NOTE — Progress Notes (Signed)
Location:  [facility] Room Number: 18-A Place of Service:  SNF [phone]) Provider:  Hazle Nordmann, NP   Patient Care Team: [name], MD as PCP - General (Internal Medicine) Ngetich, Donalee Citrin, NP as Nurse Practitioner (Family Medicine)  Extended Emergency Contact Information Primary Emergency Contact: Gordan,[place] Address: [address]          [place], [place] [address] [place] Home Phone: [phone] Mobile Phone: [phone] Relation: Daughter  Code Status:  DNR Goals of care: Advanced Directive information    11/15/2022   11:56 AM  Advanced Directives  Does Patient Have a Medical Advance Directive? Yes  Type of Estate agent of [place];Living will;Out of facility DNR (pink MOST or yellow form)  Does patient want to make changes to medical advance directive? No - Patient declined  Copy of Healthcare Power of Attorney in Chart? Yes - validated most recent copy scanned in chart (See row information)  Pre-existing out of facility DNR order (yellow form or pink MOST form) Pink MOST form placed in chart (order not valid for inpatient use);Yellow form placed in chart (order not valid for inpatient use)     Chief Complaint  Patient presents with   Hospitalization Follow-up    Follow-up from hospital visit for a fall with right leg pain.    HPI:  Pt is a 88 y.o. female seen today for f/u s/p ED visit due to recent fall.   She currently resides on the skilled nursing unit at [facility]. PMH: hypertension, CVA, dementia, GERD, diabetes, CKD stage 3, hyperlipidemia, depression, anxiety and abnormal gait.    12/02 she was found on the bathroom floor by staff. Poor historian due to dementia. She c/o low back pain after incident. She was unable to bear weight to RLE. She was taken to [facility] ED for evaluation. CT head negative for acute intracranial abnormality. CT spine negative for acte fracture or subluxation of cervical spine.  Xray right hip negative for acute fracture or displacement. CXR unremarkable. She was discharged back to [facility] skilled nursing unit.   Today, she denies RLE pain. She does not recall being at the hospital or falling.   Past Medical History:  Diagnosis Date   Allergic rhinitis    Anxiety    Balance problem 04/28/2015   Fatigue 04/28/2015   Hearing loss 04/28/2015   history of Fracture of right humerus 04/28/2015   HLD (hyperlipidemia) 03/05/2019   Hyperglycemia    Hyperlipidemia    Hypertension    Memory loss 04/28/2015   04/26/2014 MMSE 28/30. Failed clock drawing. 04/21/15 MMSE 21/30. Failed clock drawing.    Osteoporosis    Past Surgical History:  Procedure Laterality Date   ABDOMINAL HYSTERECTOMY     CATARACT EXTRACTION  2010   ELBOW SURGERY Right 1988   FEMUR FRACTURE SURGERY Right 06/2014   [place], [place] Dr. Dione Housekeeper   HUMERUS FRACTURE SURGERY Right 2015   [place], [place] Dr. Eber Hong   TONSILLECTOMY  [phone]    Allergies  Allergen Reactions   Ace Inhibitors Other (See Comments)    Unknown reaction   Almond Oil Rash   Plum Pulp Rash    Outpatient Encounter Medications as of 11/15/2022  Medication Sig   amLODipine (NORVASC) 5 MG tablet Take 5 mg by mouth daily.    atorvastatin (LIPITOR) 40 MG tablet Take 1 tablet (40 mg total) by mouth daily at 6 PM.   calcium carbonate (OSCAL)  1500 (600 Ca) MG TABS tablet Take 600 mg of elemental calcium by mouth daily with breakfast.   Cholecalciferol (VITAMIN D3) 1000 UNITS CAPS Take 1,000 Units daily by mouth.    clopidogrel (PLAVIX) 75 MG tablet Take 75 mg by mouth at bedtime.   donepezil (ARICEPT) 10 MG tablet TAKE ONE TABLET BY MOUTH DAILY TO PRESERVE MEMORY   Emollient (CERAVE) CREA Apply 1 Application topically daily. Apply to back, face, legs, and arms.   memantine (NAMENDA) 10 MG tablet Take 1 tablet (10 mg total) by mouth 2 (two) times daily.   Methylcobalamin (B12-ACTIVE) 1 MG CHEW Chew 1 tablet by mouth 3  (three) times a week. Monday, Wednesday, and Friday.   Omega-3 Fatty Acids (FISH OIL) 500 MG CAPS Take 1 capsule by mouth at bedtime.   pantoprazole (PROTONIX) 40 MG tablet Take 1 tablet (40 mg total) by mouth daily.   sertraline (ZOLOFT) 25 MG tablet Take 75 mg by mouth daily.   triamcinolone cream (KENALOG) 0.1 % Apply 1 application. topically 2 (two) times daily as needed (For red area on left arm).   triamcinolone cream (KENALOG) 0.1 % Apply 1 Application topically at bedtime as needed.   zinc oxide 20 % ointment Apply 1 application topically as needed for irritation. Apply to peri/buttocks after every incontinent episode   No facility-administered encounter medications on file as of 11/15/2022.    Review of Systems  Unable to perform ROS: Dementia    Immunization History  Administered Date(s) Administered   DTaP 05/12/2014   Fluad Quad(high Dose 65+) 10/06/2022   Influenza, High Dose Seasonal PF 09/21/2017, 09/27/2019, 09/16/2020   Influenza,inj,Quad PF,6+ Mos 09/14/2018   Influenza-Unspecified 10/07/2014, 09/11/2015, 09/23/2016, 10/07/2021   Moderna Sars-Covid-2 Vaccination 12/17/2019, 01/14/2020, 10/27/2020, 05/20/2021   PPD Test 10/16/2014   Pneumococcal Conjugate-13 10/20/2017   Pneumococcal Polysaccharide-23 08/20/2011   Unspecified SARS-COV-2 Vaccination 05/26/2022, 10/19/2022   Pertinent  Health Maintenance Due  Topic Date Due   OPHTHALMOLOGY EXAM  Never done   DEXA SCAN  09/14/2023 (Originally 03/21/1993)   HEMOGLOBIN A1C  03/18/2023   FOOT EXAM  09/14/2023   INFLUENZA VACCINE  Completed      07/01/2021    4:32 PM 07/07/2022   10:36 AM 07/07/2022   12:59 PM 11/13/2022    6:49 PM 11/15/2022   11:53 AM  Fall Risk  Falls in the past year? 1 0 1  1  Was there an injury with Fall? 0 0 0  1  Fall Risk Category Calculator 1 0 1  3  Fall Risk Category Low Low Low  High  Patient Fall Risk Level High fall risk Low fall risk Moderate fall risk High fall risk High fall risk   Patient at Risk for Falls Due to History of fall(s);Impaired balance/gait;Impaired mobility No Fall Risks History of fall(s);Impaired balance/gait;Impaired mobility  History of fall(s);Impaired balance/gait;Impaired mobility  Fall risk Follow up Falls evaluation completed;Education provided;Falls prevention discussed Falls evaluation completed Falls evaluation completed;Education provided;Falls prevention discussed  Falls evaluation completed   Functional Status Survey:    Vitals:   11/15/22 1158  BP: 113/61  Pulse: 77  Resp: 20  Temp: 97.7 F (36.5 C)  SpO2: 98%  Weight: 140 lb (63.5 kg)  Height: 5\' 2"  (1.575 m)   Body mass index is 25.61 kg/m. Physical Exam Vitals reviewed.  Constitutional:      General: She is not in acute distress. HENT:     Head: Normocephalic.  Eyes:     General:  Right eye: No discharge.        Left eye: No discharge.  Cardiovascular:     Rate and Rhythm: Normal rate and regular rhythm.     Pulses: Normal pulses.     Heart sounds: Normal heart sounds.  Pulmonary:     Effort: Pulmonary effort is normal. No respiratory distress.     Breath sounds: Normal breath sounds. No wheezing.  Abdominal:     General: Bowel sounds are normal. There is no distension.     Palpations: Abdomen is soft.     Tenderness: There is no abdominal tenderness.  Musculoskeletal:     Cervical back: Neck supple.     Right lower leg: No edema.     Left lower leg: No edema.     Comments: Able to move extremities without difficulty, lower legs without internal/external rotation, able to stand without difficulty  Skin:    General: Skin is warm.     Capillary Refill: Capillary refill takes less than 2 seconds.     Findings: Lesion present.     Comments: < 1 cm open lesion to right cheek and right forearm, CDI, surrounding skin intact  Neurological:     General: No focal deficit present.     Mental Status: She is alert. Mental status is at baseline.     Motor:  Weakness present.     Gait: Gait abnormal.     Comments: wheelchair  Psychiatric:        Mood and Affect: Mood normal.     Comments: Very pleasant, follows commands, alert to self/familiar face     Labs reviewed: Recent Labs    03/26/22 0000 09/16/22 0000  NA 143 141  K 4.1 4.2  CL 107 105  CO2 32* 32*  BUN 26* 22*  CREATININE 1.0 1.0  CALCIUM 8.9 9.0   Recent Labs    03/26/22 0000 09/16/22 0000  AST 17 12*  ALT 22 14  ALKPHOS 67 69  ALBUMIN 3.8 3.5   Recent Labs    03/26/22 0000 05/03/22 0000 09/16/22 0000  WBC 8.7 8.2 8.4  NEUTROABS 5,394.00  --  4,864.00  HGB 10.6* 11.2* 10.9*  HCT 33* 35* 35*  PLT 201 227 221   Lab Results  Component Value Date   TSH 2.77 08/24/2021   Lab Results  Component Value Date   HGBA1C 5.9 09/16/2022   Lab Results  Component Value Date   CHOL 120 03/26/2022   HDL 51 03/26/2022   LDLCALC 55 03/26/2022   TRIG 62 03/26/2022   CHOLHDL 6.3 03/06/2019    Significant Diagnostic Results in last 30 days:  DG Chest 2 View  Result Date: 11/13/2022 CLINICAL DATA:  Fall EXAM: CHEST - 2 VIEW COMPARISON:  03/05/2019 FINDINGS: The heart size and mediastinal contours are within normal limits. Both lungs are clear. The visualized skeletal structures are unremarkable. IMPRESSION: No active cardiopulmonary disease. Electronically Signed   By: Deatra Robinson M.D.   On: 11/13/2022 19:52   DG Hip Unilat W or Wo Pelvis 2-3 Views Right  Result Date: 11/13/2022 CLINICAL DATA:  Fall EXAM: DG HIP (WITH OR WITHOUT PELVIS) 2-3V RIGHT COMPARISON:  None Available. FINDINGS: There is no evidence of hip fracture or dislocation. There is no evidence of arthropathy or other focal bone abnormality. Right femoral antegrade intramedullary nail with interlocking screw is intact. IMPRESSION: No acute displaced fracture or dislocation. Electronically Signed   By: Deatra Robinson M.D.   On: 11/13/2022 19:51  CT Head Wo Contrast  Result Date: 11/13/2022 CLINICAL  DATA:  Fall EXAM: CT HEAD WITHOUT CONTRAST CT CERVICAL SPINE WITHOUT CONTRAST TECHNIQUE: Multidetector CT imaging of the head and cervical spine was performed following the standard protocol without intravenous contrast. Multiplanar CT image reconstructions of the cervical spine were also generated. RADIATION DOSE REDUCTION: This exam was performed according to the departmental dose-optimization program which includes automated exposure control, adjustment of the mA and/or kV according to patient size and/or use of iterative reconstruction technique. COMPARISON:  03/05/2019 FINDINGS: CT HEAD FINDINGS Brain: There is no mass, hemorrhage or extra-axial collection. The size and configuration of the ventricles and extra-axial CSF spaces are normal. There is hypoattenuation of the periventricular white matter, most commonly indicating chronic ischemic microangiopathy. Vascular: Atherosclerotic calcification of the vertebral and internal carotid arteries at the skull base. No abnormal hyperdensity of the major intracranial arteries or dural venous sinuses. Skull: The visualized skull base, calvarium and extracranial soft tissues are normal. Sinuses/Orbits: Right maxillary sinus opacification. No mastoid or middle ear effusion. The orbits are normal. CT CERVICAL SPINE FINDINGS Alignment: No static subluxation. Facets are aligned. Occipital condyles are normally positioned. Skull base and vertebrae: No acute fracture. Soft tissues and spinal canal: No prevertebral fluid or swelling. No visible canal hematoma. Disc levels: There is multilevel severe facet arthrosis. On the right, the C2-5 facets are fused. On the left the C3-5 facets are fused. Upper chest: No pneumothorax, pulmonary nodule or pleural effusion. Other: Normal visualized paraspinal cervical soft tissues. IMPRESSION: 1. No acute intracranial abnormality. 2. Chronic ischemic microangiopathy. 3. No acute fracture or static subluxation of the cervical spine.  Electronically Signed   By: Deatra RobinsonKevin  Herman M.D.   On: 11/13/2022 19:48   CT Cervical Spine Wo Contrast  Result Date: 11/13/2022 CLINICAL DATA:  Fall EXAM: CT HEAD WITHOUT CONTRAST CT CERVICAL SPINE WITHOUT CONTRAST TECHNIQUE: Multidetector CT imaging of the head and cervical spine was performed following the standard protocol without intravenous contrast. Multiplanar CT image reconstructions of the cervical spine were also generated. RADIATION DOSE REDUCTION: This exam was performed according to the departmental dose-optimization program which includes automated exposure control, adjustment of the mA and/or kV according to patient size and/or use of iterative reconstruction technique. COMPARISON:  03/05/2019 FINDINGS: CT HEAD FINDINGS Brain: There is no mass, hemorrhage or extra-axial collection. The size and configuration of the ventricles and extra-axial CSF spaces are normal. There is hypoattenuation of the periventricular white matter, most commonly indicating chronic ischemic microangiopathy. Vascular: Atherosclerotic calcification of the vertebral and internal carotid arteries at the skull base. No abnormal hyperdensity of the major intracranial arteries or dural venous sinuses. Skull: The visualized skull base, calvarium and extracranial soft tissues are normal. Sinuses/Orbits: Right maxillary sinus opacification. No mastoid or middle ear effusion. The orbits are normal. CT CERVICAL SPINE FINDINGS Alignment: No static subluxation. Facets are aligned. Occipital condyles are normally positioned. Skull base and vertebrae: No acute fracture. Soft tissues and spinal canal: No prevertebral fluid or swelling. No visible canal hematoma. Disc levels: There is multilevel severe facet arthrosis. On the right, the C2-5 facets are fused. On the left the C3-5 facets are fused. Upper chest: No pneumothorax, pulmonary nodule or pleural effusion. Other: Normal visualized paraspinal cervical soft tissues. IMPRESSION: 1. No  acute intracranial abnormality. 2. Chronic ischemic microangiopathy. 3. No acute fracture or static subluxation of the cervical spine. Electronically Signed   By: Deatra RobinsonKevin  Herman M.D.   On: 11/13/2022 19:48    Assessment/Plan 1. Pain  in right lower leg - 12/02 mechanical fall, unable to bear weight to RLE - ED workup unremarkable - able to move RLE without difficulty   2. Fall, initial encounter - see above - poor safety awareness due to dementia - cont skilled nursing  3. Severe vascular dementia with other behavioral disturbance (HCC) - no behaviors - cont skilled nursing - cont Aricept and Namenda  4. Skin-picking disorder - ongoing - cont triamcinolone and Cerave    Family/ staff Communication: plan discussed with patient and nurse  Labs/tests ordered:  none

## 2022-11-29 ENCOUNTER — Encounter: Payer: Self-pay | Admitting: Orthopedic Surgery

## 2022-11-29 ENCOUNTER — Non-Acute Institutional Stay (SKILLED_NURSING_FACILITY): Payer: PPO | Admitting: Orthopedic Surgery

## 2022-11-29 DIAGNOSIS — K219 Gastro-esophageal reflux disease without esophagitis: Secondary | ICD-10-CM

## 2022-11-29 DIAGNOSIS — F01C18 Vascular dementia, severe, with other behavioral disturbance: Secondary | ICD-10-CM | POA: Diagnosis not present

## 2022-11-29 DIAGNOSIS — R2681 Unsteadiness on feet: Secondary | ICD-10-CM | POA: Diagnosis not present

## 2022-11-29 DIAGNOSIS — I10 Essential (primary) hypertension: Secondary | ICD-10-CM

## 2022-11-29 DIAGNOSIS — F418 Other specified anxiety disorders: Secondary | ICD-10-CM | POA: Diagnosis not present

## 2022-11-29 DIAGNOSIS — R7303 Prediabetes: Secondary | ICD-10-CM

## 2022-11-29 DIAGNOSIS — Z8673 Personal history of transient ischemic attack (TIA), and cerebral infarction without residual deficits: Secondary | ICD-10-CM

## 2022-11-29 DIAGNOSIS — E7849 Other hyperlipidemia: Secondary | ICD-10-CM

## 2022-11-29 DIAGNOSIS — D638 Anemia in other chronic diseases classified elsewhere: Secondary | ICD-10-CM

## 2022-11-29 NOTE — Progress Notes (Addendum)
Location:  [facility] Room Number: 18A Place of Service:  SNF [id number]) Provider:  Hazle NordmannAmy Zaide Mcclenahan, NP   Patient Care Team: Mahlon GammonGupta, Anjali L, MD as PCP - General (Internal Medicine) Ngetich, Donalee Citrininah C, NP as Nurse Practitioner (Family Medicine)  Extended Emergency Contact Information Primary Emergency Contact: Gordan,[place] Address: [address]          [place], [place] [address] [place] Home Phone: [phone] Mobile Phone: [phone] Relation: Daughter  Code Status:  DNR Goals of care: Advanced Directive information    11/29/2022   10:08 AM  Advanced Directives  Does Patient Have a Medical Advance Directive? Yes  Type of Estate agentAdvance Directive Healthcare Power of [place];Living will;Out of facility DNR (pink MOST or yellow form)  Does patient want to make changes to medical advance directive? No - Patient declined  Copy of Healthcare Power of Attorney in Chart? Yes - validated most recent copy scanned in chart (See row information)  Pre-existing out of facility DNR order (yellow form or pink MOST form) Pink MOST form placed in chart (order not valid for inpatient use);Yellow form placed in chart (order not valid for inpatient use)     Chief Complaint  Patient presents with   Medical Management of Chronic Issues    [facility]    HPI:  Pt is a 88 y.o. female seen today for medical management of chronic diseases.    [facility] Past medical conditions include: hypertension, CVA, dementia, GERD, diabetes, CKD stage 3, hyperlipidemia, depression, anxiety and abnormal gait.   Dementia- CT head 02/2019 confirmed small white matter disease, BIMS 9/15 (12/12)>  10/15 (08/28)> was 12/15 (06/01), no recent behavioral outbursts, ambulates with wheelchair, remains on Aricept and Namenda  Unstable gait- 12/02 unwitnessed fall, xray right hip negative for acute  fracture or displacement, ambulating with w/c, no recent falls Depression/Anxiety- no mood changes, Na+ 143 03/26/2022, remains on Zoloft HTN- BUN/creat 22/1.0 09/16/2022, remains on amlodipine HLD- LDL 55 03/26/2022, remains on Lipitor GERD- remains on Protonix Prediabetes- a1c 5.9 09/16/2022, remains on regular diet Anemia- hgb 10.9 09/16/2022> was 10.6 03/26/2022, Iron 50, Vit B12 313 03/12/21, ferritin 11 05/03/2022, not on supplement HX CVA- hx acute infarct on left Para median pons, remains on plavix and statin  Recent blood pressures:  12/18- 125/77  12/17- 117/57  12/16- 137/75  Recent weights:  12/02- 140 lbs  11/01- 142.3 lbs  10/02- 136.1 lbs  04/03- 154.9 lbs  Past Surgical History:  Procedure Laterality Date   ABDOMINAL HYSTERECTOMY     CATARACT EXTRACTION  2010   ELBOW SURGERY Right 1988   FEMUR FRACTURE SURGERY Right 06/2014   [place] Dr. Dione HousekeeperJohn Vanderhoof   HUMERUS FRACTURE SURGERY Right 2015   [place] Dr. Eber HongBrian Miller   TONSILLECTOMY  [phone]    Allergies  Allergen Reactions   Ace Inhibitors Other (See Comments)    Unknown reaction   Almond Oil Rash   Plum Pulp Rash    Outpatient Encounter Medications as of 11/29/2022  Medication Sig   amLODipine (NORVASC) 5 MG tablet Take 5 mg by mouth daily.    atorvastatin (LIPITOR) 40 MG tablet Take 1 tablet (40 mg total) by mouth daily at 6 PM.   calcium carbonate (OSCAL) 1500 (600 Ca) MG TABS tablet Take 600 mg of elemental calcium by mouth daily with breakfast.   Cholecalciferol (VITAMIN D3)  1000 UNITS CAPS Take 1,000 Units daily by mouth.    clopidogrel (PLAVIX) 75 MG tablet Take 75 mg by mouth at bedtime.   donepezil (ARICEPT) 10 MG tablet TAKE ONE TABLET BY MOUTH DAILY TO PRESERVE MEMORY   Emollient (CERAVE) CREA Apply 1 Application topically daily. Apply to back, face, legs, and arms.   memantine (NAMENDA) 10 MG tablet Take 1 tablet (10 mg total) by mouth 2 (two) times daily.   Methylcobalamin  (B12-ACTIVE) 1 MG CHEW Chew 1 tablet by mouth 3 (three) times a week. Monday, Wednesday, and Friday.   Omega-3 Fatty Acids (FISH OIL) 500 MG CAPS Take 1 capsule by mouth at bedtime.   pantoprazole (PROTONIX) 40 MG tablet Take 1 tablet (40 mg total) by mouth daily.   sertraline (ZOLOFT) 25 MG tablet Take 75 mg by mouth daily.   triamcinolone cream (KENALOG) 0.1 % Apply 1 application. topically 2 (two) times daily as needed (For red area on left arm).   triamcinolone cream (KENALOG) 0.1 % Apply 1 Application topically at bedtime as needed.   zinc oxide 20 % ointment Apply 1 application topically as needed for irritation. Apply to peri/buttocks after every incontinent episode   No facility-administered encounter medications on file as of 11/29/2022.    Review of Systems  Unable to perform ROS: Dementia    Immunization History  Administered Date(s) Administered   DTaP 05/12/2014   Fluad Quad(high Dose 65+) 10/06/2022   Influenza, High Dose Seasonal PF 09/21/2017, 09/27/2019, 09/16/2020   Influenza,inj,Quad PF,6+ Mos 09/14/2018   Influenza-Unspecified 10/07/2014, 09/11/2015, 09/23/2016, 10/07/2021   Moderna Sars-Covid-2 Vaccination 12/17/2019, 01/14/2020, 10/27/2020, 05/20/2021   PPD Test 10/16/2014   Pneumococcal Conjugate-13 10/20/2017   Pneumococcal Polysaccharide-23 08/20/2011   Unspecified SARS-COV-2 Vaccination 05/26/2022, 10/19/2022   Pertinent  Health Maintenance Due  Topic Date Due   OPHTHALMOLOGY EXAM  Never done   DEXA SCAN  09/14/2023 (Originally 03/21/1993)   HEMOGLOBIN A1C  03/18/2023   FOOT EXAM  09/14/2023   INFLUENZA VACCINE  Completed      07/07/2022   10:36 AM 07/07/2022   12:59 PM 11/13/2022    6:49 PM 11/15/2022   11:53 AM 11/29/2022   10:08 AM  Fall Risk  Falls in the past year? 0 1  1 1   Was there an injury with Fall? 0 0  1 1  Fall Risk Category Calculator 0 1  3 3   Fall Risk Category Low Low  High High  Patient Fall Risk Level Low fall risk Moderate fall  risk High fall risk High fall risk High fall risk  Patient at Risk for Falls Due to No Fall Risks History of fall(s);Impaired balance/gait;Impaired mobility  History of fall(s);Impaired balance/gait;Impaired mobility History of fall(s);Impaired balance/gait;Impaired mobility  Fall risk Follow up Falls evaluation completed Falls evaluation completed;Education provided;Falls prevention discussed  Falls evaluation completed Falls evaluation completed   Functional Status Survey:    Vitals:   11/29/22 1004  BP: 125/77  Pulse: 90  Resp: 20  Temp: 98 F (36.7 C)  SpO2: 98%  Weight: 140 lb (63.5 kg)  Height: 5\' 2"  (1.575 m)   Body mass index is 25.61 kg/m. Physical Exam Vitals reviewed.  Constitutional:      General: She is not in acute distress. HENT:     Head: Normocephalic.     Right Ear: There is no impacted cerumen.     Left Ear: There is no impacted cerumen.     Nose: Nose normal.     Mouth/Throat:  Mouth: Mucous membranes are moist.  Eyes:     General:        Right eye: No discharge.        Left eye: No discharge.  Cardiovascular:     Rate and Rhythm: Normal rate and regular rhythm.     Pulses:          Dorsalis pedis pulses are 1+ on the right side and 1+ on the left side.       Posterior tibial pulses are 1+ on the right side and 1+ on the left side.     Heart sounds: Normal heart sounds.  Pulmonary:     Effort: Pulmonary effort is normal. No respiratory distress.     Breath sounds: Normal breath sounds. No wheezing.  Abdominal:     General: Bowel sounds are normal. There is no distension.     Palpations: Abdomen is soft.     Tenderness: There is no abdominal tenderness.  Musculoskeletal:     Cervical back: Neck supple.     Right lower leg: No edema.     Left lower leg: No edema.     Right foot: Decreased range of motion.     Left foot: Decreased range of motion.  Feet:     Right foot:     Protective Sensation: 9 sites tested.  10 sites sensed.     Skin  integrity: Dry skin present.     Toenail Condition: Fungal disease present.    Left foot:     Protective Sensation: 9 sites tested.  10 sites sensed.     Skin integrity: Dry skin present.     Toenail Condition: Fungal disease present. Skin:    General: Skin is warm and dry.     Capillary Refill: Capillary refill takes less than 2 seconds.     Comments: Approx 0.5 cm open lesion to left anterior forearm, CDI, no sign of infection. Overall skin appearance dry and flaking.   Neurological:     General: No focal deficit present.     Mental Status: She is alert. Mental status is at baseline.     Motor: Weakness present.     Gait: Gait abnormal.     Comments: wheelchair     Labs reviewed: Recent Labs    03/26/22 0000 09/16/22 0000  NA 143 141  K 4.1 4.2  CL 107 105  CO2 32* 32*  BUN 26* 22*  CREATININE 1.0 1.0  CALCIUM 8.9 9.0   Recent Labs    03/26/22 0000 09/16/22 0000  AST 17 12*  ALT 22 14  ALKPHOS 67 69  ALBUMIN 3.8 3.5   Recent Labs    03/26/22 0000 05/03/22 0000 09/16/22 0000  WBC 8.7 8.2 8.4  NEUTROABS 5,394.00  --  4,864.00  HGB 10.6* 11.2* 10.9*  HCT 33* 35* 35*  PLT 201 227 221   Lab Results  Component Value Date   TSH 2.77 08/24/2021   Lab Results  Component Value Date   HGBA1C 5.9 09/16/2022   Lab Results  Component Value Date   CHOL 120 03/26/2022   HDL 51 03/26/2022   LDLCALC 55 03/26/2022   TRIG 62 03/26/2022   CHOLHDL 6.3 03/06/2019    Significant Diagnostic Results in last 30 days:  DG Chest 2 View  Result Date: 11/13/2022 CLINICAL DATA:  Fall EXAM: CHEST - 2 VIEW COMPARISON:  03/05/2019 FINDINGS: The heart size and mediastinal contours are within normal limits. Both lungs are clear. The visualized  skeletal structures are unremarkable. IMPRESSION: No active cardiopulmonary disease. Electronically Signed   By: Deatra Robinson M.D.   On: 11/13/2022 19:52   DG Hip Unilat W or Wo Pelvis 2-3 Views Right  Result Date: 11/13/2022 CLINICAL  DATA:  Fall EXAM: DG HIP (WITH OR WITHOUT PELVIS) 2-3V RIGHT COMPARISON:  None Available. FINDINGS: There is no evidence of hip fracture or dislocation. There is no evidence of arthropathy or other focal bone abnormality. Right femoral antegrade intramedullary nail with interlocking screw is intact. IMPRESSION: No acute displaced fracture or dislocation. Electronically Signed   By: Deatra Robinson M.D.   On: 11/13/2022 19:51   CT Head Wo Contrast  Result Date: 11/13/2022 CLINICAL DATA:  Fall EXAM: CT HEAD WITHOUT CONTRAST CT CERVICAL SPINE WITHOUT CONTRAST TECHNIQUE: Multidetector CT imaging of the head and cervical spine was performed following the standard protocol without intravenous contrast. Multiplanar CT image reconstructions of the cervical spine were also generated. RADIATION DOSE REDUCTION: This exam was performed according to the departmental dose-optimization program which includes automated exposure control, adjustment of the mA and/or kV according to patient size and/or use of iterative reconstruction technique. COMPARISON:  03/05/2019 FINDINGS: CT HEAD FINDINGS Brain: There is no mass, hemorrhage or extra-axial collection. The size and configuration of the ventricles and extra-axial CSF spaces are normal. There is hypoattenuation of the periventricular white matter, most commonly indicating chronic ischemic microangiopathy. Vascular: Atherosclerotic calcification of the vertebral and internal carotid arteries at the skull base. No abnormal hyperdensity of the major intracranial arteries or dural venous sinuses. Skull: The visualized skull base, calvarium and extracranial soft tissues are normal. Sinuses/Orbits: Right maxillary sinus opacification. No mastoid or middle ear effusion. The orbits are normal. CT CERVICAL SPINE FINDINGS Alignment: No static subluxation. Facets are aligned. Occipital condyles are normally positioned. Skull base and vertebrae: No acute fracture. Soft tissues and spinal  canal: No prevertebral fluid or swelling. No visible canal hematoma. Disc levels: There is multilevel severe facet arthrosis. On the right, the C2-5 facets are fused. On the left the C3-5 facets are fused. Upper chest: No pneumothorax, pulmonary nodule or pleural effusion. Other: Normal visualized paraspinal cervical soft tissues. IMPRESSION: 1. No acute intracranial abnormality. 2. Chronic ischemic microangiopathy. 3. No acute fracture or static subluxation of the cervical spine. Electronically Signed   By: Deatra Robinson M.D.   On: 11/13/2022 19:48   CT Cervical Spine Wo Contrast  Result Date: 11/13/2022 CLINICAL DATA:  Fall EXAM: CT HEAD WITHOUT CONTRAST CT CERVICAL SPINE WITHOUT CONTRAST TECHNIQUE: Multidetector CT imaging of the head and cervical spine was performed following the standard protocol without intravenous contrast. Multiplanar CT image reconstructions of the cervical spine were also generated. RADIATION DOSE REDUCTION: This exam was performed according to the departmental dose-optimization program which includes automated exposure control, adjustment of the mA and/or kV according to patient size and/or use of iterative reconstruction technique. COMPARISON:  03/05/2019 FINDINGS: CT HEAD FINDINGS Brain: There is no mass, hemorrhage or extra-axial collection. The size and configuration of the ventricles and extra-axial CSF spaces are normal. There is hypoattenuation of the periventricular white matter, most commonly indicating chronic ischemic microangiopathy. Vascular: Atherosclerotic calcification of the vertebral and internal carotid arteries at the skull base. No abnormal hyperdensity of the major intracranial arteries or dural venous sinuses. Skull: The visualized skull base, calvarium and extracranial soft tissues are normal. Sinuses/Orbits: Right maxillary sinus opacification. No mastoid or middle ear effusion. The orbits are normal. CT CERVICAL SPINE FINDINGS Alignment: No static  subluxation.  Facets are aligned. Occipital condyles are normally positioned. Skull base and vertebrae: No acute fracture. Soft tissues and spinal canal: No prevertebral fluid or swelling. No visible canal hematoma. Disc levels: There is multilevel severe facet arthrosis. On the right, the C2-5 facets are fused. On the left the C3-5 facets are fused. Upper chest: No pneumothorax, pulmonary nodule or pleural effusion. Other: Normal visualized paraspinal cervical soft tissues. IMPRESSION: 1. No acute intracranial abnormality. 2. Chronic ischemic microangiopathy. 3. No acute fracture or static subluxation of the cervical spine. Electronically Signed   By: Deatra Robinson M.D.   On: 11/13/2022 19:48    Assessment/Plan 1. Primary hypertension - controlled - cont amlodipine  2. Unstable gait - 12/02 unwitnessed fall - xray negative for right hip fracture - cont skilled nursing care  3. Depression with anxiety - no mood changes - attends activities offered  - cont Zoloft  4. Prediabetes - A1c stable - ? yearly eye exam - diet controlled  5. Other hyperlipidemia - cont atorvastatin  6. Gastroesophageal reflux disease without esophagitis - hgb stable - cont Protonix  7. Severe vascular dementia with other behavioral disturbance (HCC) - recent BIMS 9/15> was 10/15 (07/2022) - no behaviors - cont Aricept and Namenda - cont skilled nursing care  8. Anemia, chronic disease - hgb stable  9. History of CVA (cerebrovascular accident) - cont Plavix and statin    Family/ staff Communication: plan discussed with patient and nurse  Labs/tests ordered:  yearly eye exam (awaiting HPOA approval)

## 2022-12-27 ENCOUNTER — Non-Acute Institutional Stay (SKILLED_NURSING_FACILITY): Payer: PPO | Admitting: Orthopedic Surgery

## 2022-12-27 ENCOUNTER — Encounter: Payer: Self-pay | Admitting: Orthopedic Surgery

## 2022-12-27 DIAGNOSIS — D692 Other nonthrombocytopenic purpura: Secondary | ICD-10-CM

## 2022-12-27 DIAGNOSIS — I10 Essential (primary) hypertension: Secondary | ICD-10-CM

## 2022-12-27 DIAGNOSIS — F01C18 Vascular dementia, severe, with other behavioral disturbance: Secondary | ICD-10-CM | POA: Diagnosis not present

## 2022-12-27 DIAGNOSIS — E7849 Other hyperlipidemia: Secondary | ICD-10-CM

## 2022-12-27 DIAGNOSIS — K219 Gastro-esophageal reflux disease without esophagitis: Secondary | ICD-10-CM

## 2022-12-27 DIAGNOSIS — R7303 Prediabetes: Secondary | ICD-10-CM

## 2022-12-27 DIAGNOSIS — Z8673 Personal history of transient ischemic attack (TIA), and cerebral infarction without residual deficits: Secondary | ICD-10-CM | POA: Diagnosis not present

## 2022-12-27 DIAGNOSIS — D638 Anemia in other chronic diseases classified elsewhere: Secondary | ICD-10-CM

## 2022-12-27 DIAGNOSIS — F418 Other specified anxiety disorders: Secondary | ICD-10-CM

## 2022-12-27 DIAGNOSIS — R2681 Unsteadiness on feet: Secondary | ICD-10-CM

## 2022-12-27 NOTE — Progress Notes (Addendum)
Location:  [facility] Room Number: 18 Place of Service:  SNF [phone]) Provider:  Windell Moulding, NP   Patient Care Team: Virgie Dad, MD as PCP - General (Internal Medicine) Ngetich, Nelda Bucks, NP as Nurse Practitioner (Family Medicine)  Extended Emergency Contact Information Primary Emergency Contact: Gordan,[place] Address: [address]          [place],  [address] Johnnette Litter of [place] Phone: [phone] Mobile Phone: [phone] Relation: Daughter  Code Status:  DNR Goals of care: Advanced Directive information    12/27/2022   10:26 AM  Advanced Directives  Does Patient Have a Medical Advance Directive? Yes  Type of Paramedic of [place];Living will;Out of facility DNR (pink MOST or yellow form)  Does patient want to make changes to medical advance directive? No - Patient declined  Copy of [place] in Chart? Yes - validated most recent copy scanned in chart (See row information)  Pre-existing out of facility DNR order (yellow form or pink MOST form) Pink MOST form placed in chart (order not valid for inpatient use);Yellow form placed in chart (order not valid for inpatient use)     Chief Complaint  Patient presents with   Medical Management of Chronic Issues    Routine Visit   Quality Metric Gaps    Ophthalmology exam and Covid 19 vaccine     HPI:  Pt is a 89 y.o. female seen today for medical management of chronic diseases.    She currently resides on the skilled nursing unit at [facility]. Past medical conditions include: hypertension, CVA, dementia, GERD, diabetes, CKD stage 3, hyperlipidemia, depression, anxiety and abnormal gait.    Unstable gait- no recent falls, right hip xray negative for fracture 11/13/2022, ambulates with wheelchair Dementia- CT head 02/2019 confirmed small white matter disease, BIMS 9/15 (12/12)>  10/15 (08/28)> was 12/15 (06/01), no recent behaviors, occasional  skin picking, dependent with ADLs except feeding, remains on Aricept and Namenda  HX CVA- hx acute infarct on left Para median pons, remains on plavix and statin HTN- BUN/creat 22/1.0 09/16/2022, remains on amlodipine HLD- LDL 55 03/26/2022, remains on Lipitor GERD- remains on Protonix Prediabetes- a1c 5.9 09/16/2022, remains on regular diet Anemia- hgb 10.9 09/16/2022, not on supplement Depression/Anxiety- no mood changes, Na+ 141 09/16/2022, remains on Zoloft  Recent blood pressures:  01/15- 122/65  01/14- 130/71  01/13- 127/76  Recent weights:  01/05- 142.1 lbs  12/02- 140 lbs  11/01- 142.3 lbs    Past Medical History:  Diagnosis Date   Allergic rhinitis    Anxiety    Balance problem 04/28/2015   Fatigue 04/28/2015   Hearing loss 04/28/2015   history of Fracture of right humerus 04/28/2015   HLD (hyperlipidemia) 03/05/2019   Hyperglycemia    Hyperlipidemia    Hypertension    Memory loss 04/28/2015   04/26/2014 MMSE 28/30. Failed clock drawing. 04/21/15 MMSE 21/30. Failed clock drawing.    Osteoporosis    Past Surgical History:  Procedure Laterality Date   ABDOMINAL HYSTERECTOMY     CATARACT EXTRACTION  2010   ELBOW SURGERY Right 1988   FEMUR FRACTURE SURGERY Right 06/2014   [place], [place] Dr. Dorita Fray   HUMERUS FRACTURE SURGERY Right 2015   [place], [place] Dr. Noemi Chapel   TONSILLECTOMY  [phone]    Allergies  Allergen Reactions   Ace Inhibitors Other (See Comments)    Unknown reaction   Almond Oil Rash   Plum  Pulp Rash    Outpatient Encounter Medications as of 12/27/2022  Medication Sig   amLODipine (NORVASC) 5 MG tablet Take 5 mg by mouth daily.    atorvastatin (LIPITOR) 40 MG tablet Take 1 tablet (40 mg total) by mouth daily at 6 PM.   calcium carbonate (OSCAL) 1500 (600 Ca) MG TABS tablet Take 600 mg of elemental calcium by mouth daily with breakfast.   Cholecalciferol (VITAMIN D3) 1000 UNITS CAPS Take 1,000 Units daily by mouth.    clopidogrel (PLAVIX) 75  MG tablet Take 75 mg by mouth at bedtime.   donepezil (ARICEPT) 10 MG tablet TAKE ONE TABLET BY MOUTH DAILY TO PRESERVE MEMORY   Emollient (CERAVE) CREA Apply 1 Application topically daily. Apply to back, face, legs, and arms.   memantine (NAMENDA) 10 MG tablet Take 1 tablet (10 mg total) by mouth 2 (two) times daily.   Methylcobalamin (B12-ACTIVE) 1 MG CHEW Chew 1 tablet by mouth 3 (three) times a week. Monday, Wednesday, and Friday.   Omega-3 Fatty Acids (FISH OIL) 500 MG CAPS Take 1 capsule by mouth at bedtime.   pantoprazole (PROTONIX) 40 MG tablet Take 1 tablet (40 mg total) by mouth daily.   sertraline (ZOLOFT) 25 MG tablet Take 75 mg by mouth daily.   triamcinolone cream (KENALOG) 0.1 % Apply 1 application. topically 2 (two) times daily as needed (For red area on left arm).   triamcinolone cream (KENALOG) 0.1 % Apply 1 Application topically at bedtime as needed.   zinc oxide 20 % ointment Apply 1 application topically as needed for irritation. Apply to peri/buttocks after every incontinent episode   No facility-administered encounter medications on file as of 12/27/2022.    Review of Systems  Unable to perform ROS: Dementia    Immunization History  Administered Date(s) Administered   DTaP 05/12/2014   Fluad Quad(high Dose 65+) 10/06/2022   Influenza, High Dose Seasonal PF 09/21/2017, 09/27/2019, 09/16/2020   Influenza,inj,Quad PF,6+ Mos 09/14/2018   Influenza-Unspecified 10/07/2014, 09/11/2015, 09/23/2016, 10/07/2021   Moderna Sars-Covid-2 Vaccination 12/17/2019, 01/14/2020, 10/27/2020, 05/20/2021   PPD Test 10/16/2014   Pneumococcal Conjugate-13 10/20/2017   Pneumococcal Polysaccharide-23 08/20/2011   Unspecified SARS-COV-2 Vaccination 05/26/2022, 10/19/2022   Pertinent  Health Maintenance Due  Topic Date Due   OPHTHALMOLOGY EXAM  Never done   DEXA SCAN  09/14/2023 (Originally 03/21/1993)   HEMOGLOBIN A1C  03/18/2023   FOOT EXAM  09/14/2023   INFLUENZA VACCINE  Completed       07/07/2022   12:59 PM 11/13/2022    6:49 PM 11/15/2022   11:53 AM 11/29/2022   10:08 AM 12/27/2022   10:25 AM  Fall Risk  Falls in the past year? 1  1 1 $ 0  Was there an injury with Fall? 0  1 1 0  Fall Risk Category Calculator 1  3 3 $ 0  Fall Risk Category (Retired) Low  High High   (RETIRED) Patient Fall Risk Level Moderate fall risk High fall risk High fall risk High fall risk   Patient at Risk for Falls Due to History of fall(s);Impaired balance/gait;Impaired mobility  History of fall(s);Impaired balance/gait;Impaired mobility History of fall(s);Impaired balance/gait;Impaired mobility History of fall(s);Impaired balance/gait;Impaired mobility  Fall risk Follow up Falls evaluation completed;Education provided;Falls prevention discussed  Falls evaluation completed Falls evaluation completed Falls evaluation completed   Functional Status Survey:    Vitals:   12/27/22 1023  BP: 122/65  Pulse: (!) 57  Resp: 20  Temp: (!) 97.2 F (36.2 C)  SpO2: 99%  Weight: 142 lb 1 oz (64.4 kg)  Height: 5' 2"$  (1.575 m)   Body mass index is 25.98 kg/m. Physical Exam Vitals reviewed.  Constitutional:      General: She is not in acute distress. HENT:     Head: Normocephalic.     Right Ear: There is no impacted cerumen.     Left Ear: There is no impacted cerumen.     Nose: Nose normal.     Mouth/Throat:     Mouth: Mucous membranes are moist.  Eyes:     General:        Right eye: No discharge.        Left eye: No discharge.  Cardiovascular:     Rate and Rhythm: Normal rate and regular rhythm.     Pulses: Normal pulses.     Heart sounds: Normal heart sounds.  Pulmonary:     Effort: Pulmonary effort is normal. No respiratory distress.     Breath sounds: Normal breath sounds. No wheezing or rales.  Abdominal:     General: Bowel sounds are normal. There is no distension.     Palpations: Abdomen is soft.     Tenderness: There is no abdominal tenderness.  Musculoskeletal:     Cervical  back: Neck supple.     Right lower leg: No edema.     Left lower leg: No edema.  Skin:    General: Skin is warm and dry.     Capillary Refill: Capillary refill takes less than 2 seconds.     Comments: Various non tender purple lesions to upper extremities, very in size, no skin breakdown  Neurological:     General: No focal deficit present.     Mental Status: She is alert. Mental status is at baseline.     Motor: Weakness present.     Gait: Gait abnormal.     Comments: wheelchair  Psychiatric:        Mood and Affect: Mood normal.        Behavior: Behavior normal.     Labs reviewed: Recent Labs    03/26/22 0000 09/16/22 0000  NA 143 141  K 4.1 4.2  CL 107 105  CO2 32* 32*  BUN 26* 22*  CREATININE 1.0 1.0  CALCIUM 8.9 9.0   Recent Labs    03/26/22 0000 09/16/22 0000  AST 17 12*  ALT 22 14  ALKPHOS 67 69  ALBUMIN 3.8 3.5   Recent Labs    03/26/22 0000 05/03/22 0000 09/16/22 0000  WBC 8.7 8.2 8.4  NEUTROABS 5,394.00  --  4,864.00  HGB 10.6* 11.2* 10.9*  HCT 33* 35* 35*  PLT 201 227 221   Lab Results  Component Value Date   TSH 2.77 08/24/2021   Lab Results  Component Value Date   HGBA1C 5.9 09/16/2022   Lab Results  Component Value Date   CHOL 120 03/26/2022   HDL 51 03/26/2022   LDLCALC 55 03/26/2022   TRIG 62 03/26/2022   CHOLHDL 6.3 03/06/2019    Significant Diagnostic Results in last 30 days:  No results found.  Assessment/Plan 1. Unstable gait - no recent falls - ambulates with wheelchair - cont skilled nursing   2. Severe vascular dementia with other behavioral disturbance (HCC) - no behaviors except occasional skin picking - dependent with ADLs except feeding - cont Aricept and Namenda  3. History of CVA (cerebrovascular accident) - 02/2019 MRI revealed acute stroke involving left pons - cont Plavix and atorvastatin  4. Primary hypertension - controlled - cont amlodipine  5. Other hyperlipidemia - LDL stable - cont  atorvastatin  6. Gastroesophageal reflux disease without esophagitis - hgb stable - cont Protonix  7. Prediabetes - A1c 5.9 09/2022 - no hypoglycemia - diet controlled  8. Anemia, chronic disease - hgb stable  9. Depression with anxiety - no mood changes - cont zoloft  10. Senile purpura ([place]) - education given    Family/ staff Communication: plan discussed with patient and nurse  Labs/tests ordered:  none

## 2023-01-20 ENCOUNTER — Encounter: Payer: Self-pay | Admitting: Internal Medicine

## 2023-01-20 ENCOUNTER — Non-Acute Institutional Stay (SKILLED_NURSING_FACILITY): Payer: PPO | Admitting: Internal Medicine

## 2023-01-20 DIAGNOSIS — Z8673 Personal history of transient ischemic attack (TIA), and cerebral infarction without residual deficits: Secondary | ICD-10-CM | POA: Diagnosis not present

## 2023-01-20 DIAGNOSIS — D638 Anemia in other chronic diseases classified elsewhere: Secondary | ICD-10-CM

## 2023-01-20 DIAGNOSIS — R2681 Unsteadiness on feet: Secondary | ICD-10-CM

## 2023-01-20 DIAGNOSIS — F01C18 Vascular dementia, severe, with other behavioral disturbance: Secondary | ICD-10-CM

## 2023-01-20 DIAGNOSIS — K219 Gastro-esophageal reflux disease without esophagitis: Secondary | ICD-10-CM

## 2023-01-20 DIAGNOSIS — E7849 Other hyperlipidemia: Secondary | ICD-10-CM

## 2023-01-20 DIAGNOSIS — I10 Essential (primary) hypertension: Secondary | ICD-10-CM

## 2023-01-20 DIAGNOSIS — F418 Other specified anxiety disorders: Secondary | ICD-10-CM

## 2023-01-20 NOTE — Progress Notes (Signed)
Location:  [place] Room Number: [place] of Service:  SNF [phone]) Provider:  Virgie Dad, MD   Virgie Dad, MD  Patient Care Team: Virgie Dad, MD as PCP - General (Internal Medicine) Ngetich, Nelda Bucks, NP as Nurse Practitioner [facility] Medicine)  Extended Emergency Contact Information Primary Emergency Contact: Gordan,[place] Address: [address]          [place], [place] [address] Johnnette Litter of [place] Phone: [phone] Mobile Phone: [phone] Relation: Daughter  Code Status:  DNR Goals of care: Advanced Directive information    01/20/2023    1:20 PM  Advanced Directives  Does Patient Have a Medical Advance Directive? Yes  Type of Paramedic of [place];Living will;Out of facility DNR (pink MOST or yellow form)  Does patient want to make changes to medical advance directive? No - Patient declined  Copy of [place] in Chart? Yes - validated most recent copy scanned in chart (See row information)  Pre-existing out of facility DNR order (yellow form or pink MOST form) Pink MOST form placed in chart (order not valid for inpatient use);Yellow form placed in chart (order not valid for inpatient use)     Chief Complaint  Patient presents with   Medical Management of Chronic Issues    Routine visit   Quality Metric Gaps    Discussed the need for eye exam    HPI:  Pt is a 89 y.o. female seen today for medical management of chronic diseases.    Lives in SNF in [place]   Patient has history of dementia, anxiety, With Skin Picking  hypertension and hyperlipidemia  History of  Acute CVA  MRI showed Acute infarction on Left Para median Pons     She is stable. No new Nursing issues. No Behavior issues Her weight is stable Wheelchair Dependent No Falls.Had one fall in [place] when all work up was negative She is able to Pivot and do her transfers Wt Readings from Last 3 Encounters:  01/20/23  142 lb 9.6 oz (64.7 kg)  12/27/22 142 lb 1 oz (64.4 kg)  11/29/22 140 lb (63.5 kg)    Past Medical History:  Diagnosis Date   Allergic rhinitis    Anxiety    Balance problem 04/28/2015   Fatigue 04/28/2015   Hearing loss 04/28/2015   history of Fracture of right humerus 04/28/2015   HLD (hyperlipidemia) 03/05/2019   Hyperglycemia    Hyperlipidemia    Hypertension    Memory loss 04/28/2015   04/26/2014 MMSE 28/30. Failed clock drawing. 04/21/15 MMSE 21/30. Failed clock drawing.    Osteoporosis    Past Surgical History:  Procedure Laterality Date   ABDOMINAL HYSTERECTOMY     CATARACT EXTRACTION  2010   ELBOW SURGERY Right 1988   FEMUR FRACTURE SURGERY Right 06/2014   [place], [place] Dr. Dorita Fray   HUMERUS FRACTURE SURGERY Right 2015   [place], [place] Dr. Noemi Chapel   TONSILLECTOMY  [phone]    Allergies  Allergen Reactions   Ace Inhibitors Other (See Comments)    Unknown reaction   Almond Oil Rash   Plum Pulp Rash    Outpatient Encounter Medications as of 01/20/2023  Medication Sig   amLODipine (NORVASC) 5 MG tablet Take 5 mg by mouth daily.    atorvastatin (LIPITOR) 40 MG tablet Take 1 tablet (40 mg total) by mouth daily at 6 PM.   calcium carbonate (OSCAL) 1500 (600 Ca) MG TABS  tablet Take 600 mg of elemental calcium by mouth daily with breakfast.   Cholecalciferol (VITAMIN D3) 1000 UNITS CAPS Take 1,000 Units daily by mouth.    clopidogrel (PLAVIX) 75 MG tablet Take 75 mg by mouth at bedtime.   donepezil (ARICEPT) 10 MG tablet TAKE ONE TABLET BY MOUTH DAILY TO PRESERVE MEMORY   Emollient (CERAVE) CREA Apply 1 Application topically daily. Apply to back, face, legs, and arms.   memantine (NAMENDA) 10 MG tablet Take 1 tablet (10 mg total) by mouth 2 (two) times daily.   Methylcobalamin (B12-ACTIVE) 1 MG CHEW Chew 1 tablet by mouth 3 (three) times a week. Monday, Wednesday, and Friday.   Omega-3 Fatty Acids (FISH OIL) 500 MG CAPS Take 1 capsule by mouth at bedtime.    pantoprazole (PROTONIX) 40 MG tablet Take 1 tablet (40 mg total) by mouth daily.   sertraline (ZOLOFT) 25 MG tablet Take 75 mg by mouth daily.   triamcinolone cream (KENALOG) 0.1 % Apply 1 application. topically 2 (two) times daily as needed (For red area on left arm).   triamcinolone cream (KENALOG) 0.1 % Apply 1 Application topically at bedtime as needed.   zinc oxide 20 % ointment Apply 1 application topically as needed for irritation. Apply to peri/buttocks after every incontinent episode   No facility-administered encounter medications on file as of 01/20/2023.    Review of Systems  Unable to perform ROS: Dementia    Immunization History  Administered Date(s) Administered   DTaP 05/12/2014   Fluad Quad(high Dose 65+) 10/06/2022   Influenza, High Dose Seasonal PF 09/21/2017, 09/27/2019, 09/16/2020   Influenza,inj,Quad PF,6+ Mos 09/14/2018   Influenza-Unspecified 10/07/2014, 09/11/2015, 09/23/2016, 10/07/2021   Moderna Sars-Covid-2 Vaccination 12/17/2019, 01/14/2020, 10/27/2020, 05/20/2021   PPD Test 10/16/2014   Pneumococcal Conjugate-13 10/20/2017   Pneumococcal Polysaccharide-23 08/20/2011   Unspecified SARS-COV-2 Vaccination 05/26/2022, 10/19/2022   Pertinent  Health Maintenance Due  Topic Date Due   OPHTHALMOLOGY EXAM  Never done   DEXA SCAN  09/14/2023 (Originally 03/21/1993)   HEMOGLOBIN A1C  03/18/2023   FOOT EXAM  09/14/2023   INFLUENZA VACCINE  Completed      07/07/2022   12:59 PM 11/13/2022    6:49 PM 11/15/2022   11:53 AM 11/29/2022   10:08 AM 12/27/2022   10:25 AM  Fall Risk  Falls in the past year? 1  1 1 $ 0  Was there an injury with Fall? 0  1 1 0  Fall Risk Category Calculator 1  3 3 $ 0  Fall Risk Category (Retired) Low  High High   (RETIRED) Patient Fall Risk Level Moderate fall risk High fall risk High fall risk High fall risk   Patient at Risk for Falls Due to History of fall(s);Impaired balance/gait;Impaired mobility  History of fall(s);Impaired  balance/gait;Impaired mobility History of fall(s);Impaired balance/gait;Impaired mobility History of fall(s);Impaired balance/gait;Impaired mobility  Fall risk Follow up Falls evaluation completed;Education provided;Falls prevention discussed  Falls evaluation completed Falls evaluation completed Falls evaluation completed   Functional Status Survey:    Vitals:   01/20/23 1317  BP: 122/80  Pulse: 76  Resp: 20  Temp: (!) 97.1 F (36.2 C)  TempSrc: Temporal  SpO2: 96%  Weight: 142 lb 9.6 oz (64.7 kg)  Height: 5' 2"$  (1.575 m)   Body mass index is 26.08 kg/m. Physical Exam Vitals reviewed.  Constitutional:      Appearance: Normal appearance.  HENT:     Head: Normocephalic.     Nose: Nose normal.     Mouth/Throat:  Mouth: Mucous membranes are moist.     Pharynx: Oropharynx is clear.  Eyes:     Pupils: Pupils are equal, round, and reactive to light.  Cardiovascular:     Rate and Rhythm: Normal rate and regular rhythm.     Pulses: Normal pulses.     Heart sounds: Normal heart sounds. No murmur heard. Pulmonary:     Effort: Pulmonary effort is normal.     Breath sounds: Normal breath sounds.  Abdominal:     General: Abdomen is flat. Bowel sounds are normal.     Palpations: Abdomen is soft.  Musculoskeletal:        General: No swelling.     Cervical back: Neck supple.     Comments: Small skin bruise in her ankle area in left   Skin:    General: Skin is warm.     Comments: No places of Self Picking  Neurological:     General: No focal deficit present.     Mental Status: She is alert.     Comments: Able to pivot and transfers  Psychiatric:        Mood and Affect: Mood normal.        Thought Content: Thought content normal.     Labs reviewed: Recent Labs    03/26/22 0000 09/16/22 0000  NA 143 141  K 4.1 4.2  CL 107 105  CO2 32* 32*  BUN 26* 22*  CREATININE 1.0 1.0  CALCIUM 8.9 9.0   Recent Labs    03/26/22 0000 09/16/22 0000  AST 17 12*  ALT 22 14   ALKPHOS 67 69  ALBUMIN 3.8 3.5   Recent Labs    03/26/22 0000 05/03/22 0000 09/16/22 0000  WBC 8.7 8.2 8.4  NEUTROABS 5,394.00  --  4,864.00  HGB 10.6* 11.2* 10.9*  HCT 33* 35* 35*  PLT 201 227 221   Lab Results  Component Value Date   TSH 2.77 08/24/2021   Lab Results  Component Value Date   HGBA1C 5.9 09/16/2022   Lab Results  Component Value Date   CHOL 120 03/26/2022   HDL 51 03/26/2022   LDLCALC 55 03/26/2022   TRIG 62 03/26/2022   CHOLHDL 6.3 03/06/2019    Significant Diagnostic Results in last 30 days:  No results found.  Assessment/Plan 1. Severe vascular dementia with other behavioral disturbance (HCC) Stable in SNF Total dependent for her ADLS On Aricept and namenda Plavix and statin  2. History of CVA (cerebrovascular accident) Plavix and statin  3. Primary hypertension Norvasc  4. Other hyperlipidemia statin  5. Gastroesophageal reflux disease without esophagitis Protonix  6. Anemia, chronic disease No Further work up due to her dementia  [employer] stores But HGB acceptable Will watch for now  7. Depression with anxiety Zoloft  8. Unstable gait     Family/ staff Communication:   Labs/tests ordered:  CBC,CMP,Lipid , TSH

## 2023-02-03 LAB — BASIC METABOLIC PANEL
BUN: 31 — AB (ref 4–21)
CO2: 31 — AB (ref 13–22)
Chloride: 106 (ref 99–108)
Creatinine: 1.2 — AB (ref 0.5–1.1)
Glucose: 94
Potassium: 4.2 mEq/L (ref 3.5–5.1)
Sodium: 143 (ref 137–147)

## 2023-02-03 LAB — COMPREHENSIVE METABOLIC PANEL
Albumin: 4 (ref 3.5–5.0)
Calcium: 9.4 (ref 8.7–10.7)
Globulin: 2.4

## 2023-02-03 LAB — CBC AND DIFFERENTIAL
HCT: 34 — AB (ref 36–46)
Hemoglobin: 10.7 — AB (ref 12.0–16.0)
Platelets: 264 10*3/uL (ref 150–400)
WBC: 10.5

## 2023-02-03 LAB — HEPATIC FUNCTION PANEL
ALT: 16 U/L (ref 7–35)
AST: 12 — AB (ref 13–35)
Alkaline Phosphatase: 71 (ref 25–125)

## 2023-02-03 LAB — CBC: RBC: 3.71 — AB (ref 3.87–5.11)

## 2023-02-11 ENCOUNTER — Encounter: Payer: Self-pay | Admitting: Orthopedic Surgery

## 2023-02-11 ENCOUNTER — Non-Acute Institutional Stay (SKILLED_NURSING_FACILITY): Payer: PPO | Admitting: Orthopedic Surgery

## 2023-02-11 DIAGNOSIS — K219 Gastro-esophageal reflux disease without esophagitis: Secondary | ICD-10-CM

## 2023-02-11 DIAGNOSIS — F424 Excoriation (skin-picking) disorder: Secondary | ICD-10-CM

## 2023-02-11 DIAGNOSIS — E7849 Other hyperlipidemia: Secondary | ICD-10-CM

## 2023-02-11 DIAGNOSIS — F418 Other specified anxiety disorders: Secondary | ICD-10-CM

## 2023-02-11 DIAGNOSIS — I10 Essential (primary) hypertension: Secondary | ICD-10-CM | POA: Diagnosis not present

## 2023-02-11 DIAGNOSIS — D638 Anemia in other chronic diseases classified elsewhere: Secondary | ICD-10-CM

## 2023-02-11 DIAGNOSIS — Z8673 Personal history of transient ischemic attack (TIA), and cerebral infarction without residual deficits: Secondary | ICD-10-CM

## 2023-02-11 DIAGNOSIS — F01C18 Vascular dementia, severe, with other behavioral disturbance: Secondary | ICD-10-CM | POA: Diagnosis not present

## 2023-02-11 DIAGNOSIS — R7303 Prediabetes: Secondary | ICD-10-CM

## 2023-02-11 DIAGNOSIS — R2681 Unsteadiness on feet: Secondary | ICD-10-CM

## 2023-02-11 NOTE — Progress Notes (Signed)
Location:  [facility] Room Number: 18/A Place of Service:  SNF [phone]) Provider:  Yvonna Alanis, NP   Virgie Dad, MD  Patient Care Team: Virgie Dad, MD as PCP - General (Internal Medicine) [place], Nelda Bucks, NP as Nurse Practitioner [facility] Medicine)  Extended Emergency Contact Information Primary Emergency Contact: Gordan,[place] Address: [address]          [place], [place] [address] Johnnette Litter of [place] Phone: [phone] Mobile Phone: [phone] Relation: Daughter  Code Status:  DNR Goals of care: Advanced Directive information    01/20/2023    1:20 PM  Advanced Directives  Does Patient Have a Medical Advance Directive? Yes  Type of Paramedic of [place];Living will;Out of facility DNR (pink MOST or yellow form)  Does patient want to make changes to medical advance directive? No - Patient declined  Copy of [place] in Chart? Yes - validated most recent copy scanned in chart (See row information)  Pre-existing out of facility DNR order (yellow form or pink MOST form) Pink MOST form placed in chart (order not valid for inpatient use);Yellow form placed in chart (order not valid for inpatient use)     Chief Complaint  Patient presents with   Medical Management of Chronic Issues    HPI:  Pt is a 89 y.o. female seen today for medical management of chronic diseases.    She currently resides on the skilled nursing unit at [facility]. Past medical conditions include: hypertension, CVA, dementia, GERD, diabetes, CKD stage 3, hyperlipidemia, depression, anxiety and abnormal gait.    Skin picking- ongoing, new open lesions to RUE, she reports ongoing dry skin, remains on Kenalog cream prn Dementia- CT head 02/2019 confirmed small white matter disease, BIMS 9/15 (12/12)>  10/15 (08/28)> was 12/15 (06/01), no recent behaviors, ST provided picture binder about her life> she has been using it more,  dependent with ADLs except feeding, remains on Aricept and Namenda  HX CVA- hx acute infarct on left Para median pons, remains on plavix and statin HTN- BUN/creat 22/1.0 09/16/2022, remains on amlodipine HLD- LDL 55 03/26/2022, remains on Lipitor GERD- remains on Protonix Prediabetes- a1c 5.9 09/16/2022, remains on regular diet Anemia- hgb 10.9 09/16/2022, not on supplement Depression/Anxiety- no mood changes, Na+ 144 12/23/2022, remains on Zoloft  Recent blood pressures:  03/01- 140/73  02/29- 136/71  02/28- 140/90  Recent weights:  03/01- 142.7 lbs  02/01- 142.6 lbs  01/01- 142.1 lbs  Past Surgical History:  Procedure Laterality Date   ABDOMINAL HYSTERECTOMY     CATARACT EXTRACTION  2010   ELBOW SURGERY Right 1988   FEMUR FRACTURE SURGERY Right 06/2014   [place] Dr. Dorita Fray   HUMERUS FRACTURE SURGERY Right 2015   [place], [place] Dr. Noemi Chapel   TONSILLECTOMY  [phone]    Allergies  Allergen Reactions   Ace Inhibitors Other (See Comments)    Unknown reaction   Almond Oil Rash   Plum Pulp Rash    Outpatient Encounter Medications as of 02/11/2023  Medication Sig   amLODipine (NORVASC) 5 MG tablet Take 5 mg by mouth daily.    atorvastatin (LIPITOR) 40 MG tablet Take 1 tablet (40 mg total) by mouth daily at 6 PM.   calcium carbonate (OSCAL) 1500 (600 Ca) MG TABS tablet Take 600 mg of elemental calcium by mouth daily with breakfast.   Cholecalciferol (VITAMIN D3) 1000 UNITS CAPS Take 1,000 Units daily by mouth.  clopidogrel (PLAVIX) 75 MG tablet Take 75 mg by mouth at bedtime.   donepezil (ARICEPT) 10 MG tablet TAKE ONE TABLET BY MOUTH DAILY TO PRESERVE MEMORY   Emollient (CERAVE) CREA Apply 1 Application topically daily. Apply to back, face, legs, and arms.   memantine (NAMENDA) 10 MG tablet Take 1 tablet (10 mg total) by mouth 2 (two) times daily.   Methylcobalamin (B12-ACTIVE) 1 MG CHEW Chew 1 tablet by mouth 3 (three) times a week. Monday, Wednesday, and  Friday.   Omega-3 Fatty Acids (FISH OIL) 500 MG CAPS Take 1 capsule by mouth at bedtime.   pantoprazole (PROTONIX) 40 MG tablet Take 1 tablet (40 mg total) by mouth daily.   sertraline (ZOLOFT) 25 MG tablet Take 75 mg by mouth daily.   triamcinolone cream (KENALOG) 0.1 % Apply 1 application. topically 2 (two) times daily as needed (For red area on left arm).   triamcinolone cream (KENALOG) 0.1 % Apply 1 Application topically at bedtime as needed.   zinc oxide 20 % ointment Apply 1 application topically as needed for irritation. Apply to peri/buttocks after every incontinent episode   No facility-administered encounter medications on file as of 02/11/2023.    Review of Systems  Unable to perform ROS: Dementia    Immunization History  Administered Date(s) Administered   DTaP 05/12/2014   Fluad Quad(high Dose 65+) 10/06/2022   Influenza, High Dose Seasonal PF 09/21/2017, 09/27/2019, 09/16/2020   Influenza,inj,Quad PF,6+ Mos 09/14/2018   Influenza-Unspecified 10/07/2014, 09/11/2015, 09/23/2016, 10/07/2021   Moderna Sars-Covid-2 Vaccination 12/17/2019, 01/14/2020, 10/27/2020, 05/20/2021   PPD Test 10/16/2014   Pneumococcal Conjugate-13 10/20/2017   Pneumococcal Polysaccharide-23 08/20/2011   Unspecified SARS-COV-2 Vaccination 05/26/2022, 10/19/2022   Pertinent  Health Maintenance Due  Topic Date Due   OPHTHALMOLOGY EXAM  Never done   DEXA SCAN  09/14/2023 (Originally 03/21/1993)   HEMOGLOBIN A1C  03/18/2023   FOOT EXAM  09/14/2023   INFLUENZA VACCINE  Completed      11/13/2022    6:49 PM 11/15/2022   11:53 AM 11/29/2022   10:08 AM 12/27/2022   10:25 AM 02/11/2023    1:47 PM  Fall Risk  Falls in the past year?  1 1 0 1  Was there an injury with Fall?  1 1 0 0  Fall Risk Category Calculator  3 3 0 1  Fall Risk Category (Retired)  [employer]    (RETIRED) Patient Fall Risk Level High fall risk High fall risk High fall risk    Patient at Risk for Falls Due to  History of fall(s);Impaired  balance/gait;Impaired mobility History of fall(s);Impaired balance/gait;Impaired mobility History of fall(s);Impaired balance/gait;Impaired mobility History of fall(s);Impaired balance/gait;Impaired mobility  Fall risk Follow up  Falls evaluation completed Falls evaluation completed Falls evaluation completed Falls evaluation completed;Education provided;Falls prevention discussed   Functional Status Survey:    Vitals:   02/11/23 1346  BP: (!) 140/73  Pulse: 80  Resp: 18  Temp: 97.7 F (36.5 C)  SpO2: 95%  Weight: 142 lb 11.2 oz (64.7 kg)  Height: '5\' 2"'$  (1.575 m)   Body mass index is 26.1 kg/m. Physical Exam Vitals reviewed.  Constitutional:      General: She is not in acute distress. HENT:     Head: Normocephalic.     Right Ear: There is no impacted cerumen.     Left Ear: There is no impacted cerumen.     Nose: Nose normal.     Mouth/Throat:     Mouth: Mucous membranes are  moist.  Eyes:     General:        Right eye: No discharge.        Left eye: No discharge.  Cardiovascular:     Rate and Rhythm: Normal rate and regular rhythm.     Pulses: Normal pulses.     Heart sounds: Normal heart sounds.  Pulmonary:     Effort: Pulmonary effort is normal. No respiratory distress.     Breath sounds: Normal breath sounds. No wheezing.  Abdominal:     General: Bowel sounds are normal. There is no distension.     Palpations: Abdomen is soft.     Tenderness: There is no abdominal tenderness.  Musculoskeletal:     Cervical back: Neck supple.     Right lower leg: No edema.     Left lower leg: No edema.  Skin:    General: Skin is warm and dry.     Capillary Refill: Capillary refill takes less than 2 seconds.     Comments: Overall skin appearance dry. Approx 0.5 cm open lesion to RUE x 2, CDI, surrounding skin intact  Neurological:     General: No focal deficit present.     Mental Status: She is alert. Mental status is at baseline.     Motor: Weakness present.     Gait: Gait  abnormal.     Comments: wheelchair  Psychiatric:        Mood and Affect: Mood normal.     Comments: Alert to self/familiar face/place, follows commands     Labs reviewed: Recent Labs    03/26/22 0000 09/16/22 0000  NA 143 141  K 4.1 4.2  CL 107 105  CO2 32* 32*  BUN 26* 22*  CREATININE 1.0 1.0  CALCIUM 8.9 9.0   Recent Labs    03/26/22 0000 09/16/22 0000  AST 17 12*  ALT 22 14  ALKPHOS 67 69  ALBUMIN 3.8 3.5   Recent Labs    03/26/22 0000 05/03/22 0000 09/16/22 0000  WBC 8.7 8.2 8.4  NEUTROABS 5,394.00  --  4,864.00  HGB 10.6* 11.2* 10.9*  HCT 33* 35* 35*  PLT 201 227 221   Lab Results  Component Value Date   TSH 2.77 08/24/2021   Lab Results  Component Value Date   HGBA1C 5.9 09/16/2022   Lab Results  Component Value Date   CHOL 120 03/26/2022   HDL 51 03/26/2022   LDLCALC 55 03/26/2022   TRIG 62 03/26/2022   CHOLHDL 6.3 03/06/2019    Significant Diagnostic Results in last 30 days:  No results found.  Assessment/Plan 1. Skin-picking disorder - ongoing - associated with dementia - open lesions to RUE> no sign of infection - will schedule Kenalog cream daily to forearms x 2 week  2. Severe vascular dementia with other behavioral disturbance ([place]) - no behaviors - ST recently made picture book about life> she refers to it often - dependent with some ADLs, able to feed self - ambulates with wheelchair - cont Aricept and Namenda  3. History of CVA (cerebrovascular accident) - h/o acute infarction to left para and median pons - cont atorvastatin and plavix  4. Primary hypertension - controlled with amlodipine  5. Other hyperlipidemia - stable with atorvastatin  6. Gastroesophageal reflux disease without esophagitis - hgb stable - cont Protonix  7. Prediabetes - A1c stable - do not recommend eye exam due to dementia/ advanced age - not on medication - regular diet  8. Anemia, chronic  disease - hgb stable - cont B12  supplement  9. Depression with anxiety - no mood changes - Na+ stable - cont Zoloft  10. Unstable gait - found on floor 02/24> no injury - cont skilled nursing care    Family/ staff Communication: plan discussed with patient and nurse  Labs/tests ordered:  none

## 2023-03-14 ENCOUNTER — Non-Acute Institutional Stay (SKILLED_NURSING_FACILITY): Payer: PPO | Admitting: Orthopedic Surgery

## 2023-03-14 ENCOUNTER — Encounter: Payer: Self-pay | Admitting: Orthopedic Surgery

## 2023-03-14 DIAGNOSIS — R7303 Prediabetes: Secondary | ICD-10-CM | POA: Diagnosis not present

## 2023-03-14 DIAGNOSIS — E7849 Other hyperlipidemia: Secondary | ICD-10-CM

## 2023-03-14 DIAGNOSIS — F418 Other specified anxiety disorders: Secondary | ICD-10-CM

## 2023-03-14 DIAGNOSIS — F01C18 Vascular dementia, severe, with other behavioral disturbance: Secondary | ICD-10-CM

## 2023-03-14 DIAGNOSIS — I10 Essential (primary) hypertension: Secondary | ICD-10-CM | POA: Diagnosis not present

## 2023-03-14 DIAGNOSIS — K219 Gastro-esophageal reflux disease without esophagitis: Secondary | ICD-10-CM

## 2023-03-14 DIAGNOSIS — F424 Excoriation (skin-picking) disorder: Secondary | ICD-10-CM

## 2023-03-14 DIAGNOSIS — N1831 Chronic kidney disease, stage 3a: Secondary | ICD-10-CM | POA: Diagnosis not present

## 2023-03-14 DIAGNOSIS — D638 Anemia in other chronic diseases classified elsewhere: Secondary | ICD-10-CM

## 2023-03-14 DIAGNOSIS — Z8673 Personal history of transient ischemic attack (TIA), and cerebral infarction without residual deficits: Secondary | ICD-10-CM

## 2023-03-14 NOTE — Progress Notes (Signed)
Location:   [facility] Room Number: 18-A Place of Service:  SNF [phone]) Provider:  Windell Moulding, NP  PCP: Virgie Dad, MD  Patient Care Team: Virgie Dad, MD as PCP - General (Internal Medicine) Ngetich, Nelda Bucks, NP as Nurse Practitioner (Family Medicine)  Extended Emergency Contact Information Primary Emergency Contact: Gordan,[place] Address: [address]          [place], [place] [address] Johnnette Litter of [place] Phone: [phone] Mobile Phone: [phone] Relation: Daughter  Code Status:  DNR Goals of care: Advanced Directive information    03/14/2023   10:15 AM  Advanced Directives  Does Patient Have a Medical Advance Directive? Yes  Type of Paramedic of [place];Living will;Out of facility DNR (pink MOST or yellow form)  Does patient want to make changes to medical advance directive? No - Patient declined  Copy of [place] in Chart? Yes - validated most recent copy scanned in chart (See row information)     Chief Complaint  Patient presents with   Medical Management of Chronic Issues    Routine Visit.    HPI:  Pt is a 89 y.o. female seen today for medical management of chronic diseases.    She currently resides on the skilled nursing unit at [facility]. Past medical conditions include: hypertension, CVA, dementia, GERD, diabetes, CKD stage 3, hyperlipidemia, depression, anxiety and abnormal gait.   HTN- BUN/creat 22/1.0 09/16/2022, remains on amlodipine HLD- LDL 55 03/26/2022, remains on Lipitor CKD- BUN/creat 22/1.0, GFR 50 09/16/2022 Prediabetes- a1c 5.9 09/16/2022, remains on regular diet Dementia- CT head 02/2019 confirmed small white matter disease, BIMS 12/15 (03/12)> was 9/15 (12/12), no recent behaviors, dependent with ADLs except feeding, remains on Aricept and Namenda  HX CVA- hx acute infarct on left Para median pons, remains on plavix and statin Anemia- hgb 10.9 09/16/2022,  not on supplement Depression/Anxiety- no mood changes, Na+ 144 12/23/2022, remains on Zoloft  Skin picking- remains on Kenalog cream prn GERD- remains on Protonix  She was found on the bathroom floor this morning by nursing. No apparent injury. She is able to move extremities without difficulty.   She is excited to celebrate 47 th birthday next week.   Recent blood pressures:  04/01- 132/69  03/31- 138/78  03/30- 123/79  Recent weights:  04/01- 144.6 lbs  03/07- 142.7 lbs  02/01- 142.6 lbs    Past Medical History:  Diagnosis Date   Allergic rhinitis    Anxiety    Balance problem 04/28/2015   Fatigue 04/28/2015   Hearing loss 04/28/2015   history of Fracture of right humerus 04/28/2015   HLD (hyperlipidemia) 03/05/2019   Hyperglycemia    Hyperlipidemia    Hypertension    Memory loss 04/28/2015   04/26/2014 MMSE 28/30. Failed clock drawing. 04/21/15 MMSE 21/30. Failed clock drawing.    Osteoporosis    Past Surgical History:  Procedure Laterality Date   ABDOMINAL HYSTERECTOMY     CATARACT EXTRACTION  2010   ELBOW SURGERY Right 1988   FEMUR FRACTURE SURGERY Right 06/2014   [place], [place] Dr. Dorita Fray   HUMERUS FRACTURE SURGERY Right 2015   [place], [place] Dr. Noemi Chapel   TONSILLECTOMY  [phone]    Allergies  Allergen Reactions   Ace Inhibitors Other (See Comments)    Unknown reaction   Almond Oil Rash   Plum Pulp Rash    Allergies as of 03/14/2023       Reactions  Ace Inhibitors Other (See Comments)   Unknown reaction   Almond Oil Rash   Plum Pulp Rash        Medication List        Accurate as of March 14, 2023 10:15 AM. If you have any questions, ask your nurse or doctor.          amLODipine 5 MG tablet Commonly known as: NORVASC Take 5 mg by mouth daily.   atorvastatin 40 MG tablet Commonly known as: LIPITOR Take 1 tablet (40 mg total) by mouth daily at 6 PM.   B12-Active 1 MG Chew Generic drug: Methylcobalamin Chew 1 tablet by mouth 3  (three) times a week. Monday, Wednesday, and Friday.   calcium carbonate 1500 (600 Ca) MG Tabs tablet Commonly known as: OSCAL Take 600 mg of elemental calcium by mouth daily with breakfast.   CeraVe Crea Apply 1 Application topically daily. Apply to back, face, legs, and arms.   clopidogrel 75 MG tablet Commonly known as: PLAVIX Take 75 mg by mouth at bedtime.   donepezil 10 MG tablet Commonly known as: ARICEPT TAKE ONE TABLET BY MOUTH DAILY TO PRESERVE MEMORY   Fish Oil 500 MG Caps Take 1 capsule by mouth at bedtime.   memantine 10 MG tablet Commonly known as: Namenda Take 1 tablet (10 mg total) by mouth 2 (two) times daily.   pantoprazole 40 MG tablet Commonly known as: Protonix Take 1 tablet (40 mg total) by mouth daily.   sertraline 25 MG tablet Commonly known as: ZOLOFT Take 75 mg by mouth daily.   triamcinolone cream 0.1 % Commonly known as: KENALOG Apply 1 application. topically 2 (two) times daily as needed (For red area on left arm).   Vitamin D3 25 MCG (1000 UT) capsule Generic drug: Cholecalciferol Take 1,000 Units daily by mouth.   zinc oxide 20 % ointment Apply 1 application topically as needed for irritation. Apply to peri/buttocks after every incontinent episode        Review of Systems  Unable to perform ROS: Dementia    Immunization History  Administered Date(s) Administered   DTaP 05/12/2014   Fluad Quad(high Dose 65+) 10/06/2022   Influenza, High Dose Seasonal PF 09/21/2017, 09/27/2019, 09/16/2020   Influenza,inj,Quad PF,6+ Mos 09/14/2018   Influenza-Unspecified 10/07/2014, 09/11/2015, 09/23/2016, 10/07/2021   Moderna Sars-Covid-2 Vaccination 12/17/2019, 01/14/2020, 10/27/2020, 05/20/2021   PPD Test 10/16/2014   Pneumococcal Conjugate-13 10/20/2017   Pneumococcal Polysaccharide-23 08/20/2011   Unspecified SARS-COV-2 Vaccination 05/26/2022, 10/19/2022   Pertinent  Health Maintenance Due  Topic Date Due   DEXA SCAN  09/14/2023  (Originally [birth year]/21/1993)   HEMOGLOBIN A1C  03/18/2023   INFLUENZA VACCINE  07/14/2023   FOOT EXAM  09/14/2023   OPHTHALMOLOGY EXAM  Discontinued      11/13/2022    6:49 PM 11/15/2022   11:53 AM 11/29/2022   10:08 AM 12/27/2022   10:25 AM 02/11/2023    1:47 PM  Fall Risk  Falls in the past year?  1 1 0 1  Was there an injury with Fall?  1 1 0 0  Fall Risk Category Calculator  3 3 0 1  Fall Risk Category (Retired)  [employer]    (RETIRED) Patient Fall Risk Level High fall risk High fall risk High fall risk    Patient at Risk for Falls Due to  History of fall(s);Impaired balance/gait;Impaired mobility History of fall(s);Impaired balance/gait;Impaired mobility History of fall(s);Impaired balance/gait;Impaired mobility History of fall(s);Impaired balance/gait;Impaired mobility  Fall risk Follow up  Falls evaluation completed Falls evaluation completed Falls evaluation completed Falls evaluation completed;Education provided;Falls prevention discussed   Functional Status Survey:    Vitals:   03/14/23 1012  BP: 132/69  Pulse: 73  Resp: 18  Temp: (!) 97.3 F (36.3 C)  SpO2: 95%  Weight: 144 lb 9.6 oz (65.6 kg)  Height: 5\' 2"  (1.575 m)   Body mass index is 26.45 kg/m. Physical Exam Vitals reviewed.  Constitutional:      General: She is not in acute distress.    Appearance: She is not ill-appearing.  HENT:     Head: Normocephalic and atraumatic.     Right Ear: There is no impacted cerumen.     Left Ear: There is no impacted cerumen.     Nose: Nose normal.     Mouth/Throat:     Mouth: Mucous membranes are moist.  Eyes:     General:        Right eye: No discharge.        Left eye: No discharge.  Cardiovascular:     Rate and Rhythm: Normal rate and regular rhythm.     Pulses: Normal pulses.     Heart sounds: Normal heart sounds.  Pulmonary:     Effort: Pulmonary effort is normal.     Breath sounds: Normal breath sounds.  Abdominal:     General: Bowel sounds are normal. There is  no distension.     Palpations: Abdomen is soft.     Tenderness: There is no abdominal tenderness.  Musculoskeletal:     Cervical back: Neck supple.     Right lower leg: No edema.     Left lower leg: No edema.  Skin:    General: Skin is warm and dry.     Capillary Refill: Capillary refill takes less than 2 seconds.     Comments: 2 pea sized open lesions to face, CDI, no drainage/erythema, surrounding skin intact  Neurological:     General: No focal deficit present.     Mental Status: She is alert. Mental status is at baseline.     Motor: Weakness present.     Gait: Gait abnormal.     Comments: wheelchair  Psychiatric:        Mood and Affect: Mood normal.     Labs reviewed: Recent Labs    03/26/22 0000 09/16/22 0000 02/03/23 0000  NA 143 141 143  K 4.1 4.2 4.2  CL 107 105 106  CO2 32* 32* 31*  BUN 26* 22* 31*  CREATININE 1.0 1.0 1.2*  CALCIUM 8.9 9.0 9.4   Recent Labs    03/26/22 0000 09/16/22 0000 02/03/23 0000  AST 17 12* 12*  ALT 22 14 16   ALKPHOS 67 69 71  ALBUMIN 3.8 3.5 4.0   Recent Labs    03/26/22 0000 05/03/22 0000 09/16/22 0000 02/03/23 0000  WBC 8.7 8.2 8.4 10.5  NEUTROABS 5,394.00  --  4,864.00  --   HGB 10.6* 11.2* 10.9* 10.7*  HCT 33* 35* 35* 34*  PLT 201 227 221 264   Lab Results  Component Value Date   TSH 2.77 08/24/2021   Lab Results  Component Value Date   HGBA1C 5.9 09/16/2022   Lab Results  Component Value Date   CHOL 120 03/26/2022   HDL 51 03/26/2022   LDLCALC 55 03/26/2022   TRIG 62 03/26/2022   CHOLHDL 6.3 03/06/2019    Significant Diagnostic Results in last 30 days:  No results found.  Assessment/Plan 1.  Primary hypertension - controlled with amlodipine  2. Other hyperlipidemia - LDL 55 03/2022 - cont atorvastatin  3. Stage 3a chronic kidney disease - avoid nephrotoxic drugs like NSAIDS and dose adjust medications to be renally excreted - encourage hydration with water   4. Prediabetes - A1c stable -  diet controlled  5. Severe vascular dementia with other behavioral disturbance - no behaviors - dependent with ADLs except feeding  - ambulates with wheelchair - cont Aricept and Namenda  6. History of CVA (cerebrovascular accident) - cont plavix and statin  7. Anemia, chronic disease - hgb stable - cont Protonix  8. Depression with anxiety - no mood changes - cont Zoloft  9. Skin-picking disorder - ongoing - associated with dementia - 2 pea sized lesions on face> no sign of infection - cont triamcinolone cream  10. Gastroesophageal reflux disease without esophagitis - hgb stable - cont Protonix    Family/ staff Communication: plan discussed with patient and nurse  Labs/tests ordered:  cbc/diff, cmp, lipid panel, A1c

## 2023-03-18 LAB — LIPID PANEL
Cholesterol: 109 (ref 0–200)
HDL: 40 (ref 35–70)
LDL Cholesterol: 52
LDl/HDL Ratio: 2.7
Triglycerides: 87 (ref 40–160)

## 2023-03-18 LAB — BASIC METABOLIC PANEL
BUN: 24 — AB (ref 4–21)
CO2: 32 — AB (ref 13–22)
Chloride: 106 (ref 99–108)
Creatinine: 1.1 (ref 0.5–1.1)
Glucose: 96
Potassium: 4.1 mEq/L (ref 3.5–5.1)
Sodium: 141 (ref 137–147)

## 2023-03-18 LAB — COMPREHENSIVE METABOLIC PANEL
Albumin: 3.5 (ref 3.5–5.0)
Calcium: 8.8 (ref 8.7–10.7)
Globulin: 2
eGFR: 49

## 2023-03-18 LAB — CBC AND DIFFERENTIAL
HCT: 31 — AB (ref 36–46)
Hemoglobin: 9.4 — AB (ref 12.0–16.0)
Neutrophils Absolute: 4032
Platelets: 217 10*3/uL (ref 150–400)
WBC: 7.2

## 2023-03-18 LAB — HEPATIC FUNCTION PANEL
ALT: 13 U/L (ref 7–35)
AST: 12 — AB (ref 13–35)
Alkaline Phosphatase: 58 (ref 25–125)
Bilirubin, Total: 0.4

## 2023-03-18 LAB — CBC: RBC: 3.27 — AB (ref 3.87–5.11)

## 2023-03-18 LAB — HEMOGLOBIN A1C: Hemoglobin A1C: 6.2

## 2023-04-14 ENCOUNTER — Encounter: Payer: Self-pay | Admitting: Internal Medicine

## 2023-04-14 ENCOUNTER — Non-Acute Institutional Stay (SKILLED_NURSING_FACILITY): Payer: PPO | Admitting: Internal Medicine

## 2023-04-14 DIAGNOSIS — E7849 Other hyperlipidemia: Secondary | ICD-10-CM

## 2023-04-14 DIAGNOSIS — K219 Gastro-esophageal reflux disease without esophagitis: Secondary | ICD-10-CM

## 2023-04-14 DIAGNOSIS — F01C18 Vascular dementia, severe, with other behavioral disturbance: Secondary | ICD-10-CM | POA: Diagnosis not present

## 2023-04-14 DIAGNOSIS — D638 Anemia in other chronic diseases classified elsewhere: Secondary | ICD-10-CM

## 2023-04-14 DIAGNOSIS — F418 Other specified anxiety disorders: Secondary | ICD-10-CM

## 2023-04-14 DIAGNOSIS — I10 Essential (primary) hypertension: Secondary | ICD-10-CM | POA: Diagnosis not present

## 2023-04-14 DIAGNOSIS — N1831 Chronic kidney disease, stage 3a: Secondary | ICD-10-CM

## 2023-04-14 DIAGNOSIS — F424 Excoriation (skin-picking) disorder: Secondary | ICD-10-CM

## 2023-04-14 DIAGNOSIS — Z8673 Personal history of transient ischemic attack (TIA), and cerebral infarction without residual deficits: Secondary | ICD-10-CM

## 2023-04-14 NOTE — Progress Notes (Signed)
Location:  [facility] Room Number: 18A Place of Service:  SNF [phone]) Provider:  [name], MD   [name], MD  Patient Care Team: [name], MD as PCP - General (Internal Medicine) Ngetich, Donalee Citrin, NP as Nurse Practitioner [facility])  Extended Emergency Contact Information Primary Emergency Contact: Gordan,[place] Address: [address]          [place], [place] [address] Darden Amber of [place] Home Phone: [phone] Mobile Phone: [phone] Relation: Daughter  Code Status:  DNR Goals of care: Advanced Directive information    04/14/2023   11:08 AM  Advanced Directives  Does Patient Have a Medical Advance Directive? Yes  Type of Estate agent of [place];Living will;Out of facility DNR (pink MOST or yellow form)  Does patient want to make changes to medical advance directive? No - Patient declined  Copy of Healthcare Power of Attorney in Chart? Yes - validated most recent copy scanned in chart (See row information)     Chief Complaint  Patient presents with   Medical Management of Chronic Issues    Patient is being seen for Routine visit     HPI:  Pt is a 89 y.o. female seen today for medical management of chronic diseases.   Lives in SNF [facility]   Patient has history of dementia, anxiety, With Skin Picking  hypertension and hyperlipidemia  History of  Acute CVA  MRI showed Acute infarction on Left Para median Pons    She is stable. No new Nursing issues. No Behavior issues Her weight is stable Wheelchair dependent Able to Pivot and do her transfers No Falls Wt Readings from Last 3 Encounters:  04/14/23 142 lb 3.2 oz (64.5 kg)  03/14/23 144 lb 9.6 oz (65.6 kg)  02/11/23 142 lb 11.2 oz (64.7 kg)    Past Medical History:  Diagnosis Date   Allergic rhinitis    Anxiety    Balance problem 04/28/2015   Fatigue 04/28/2015   Hearing loss 04/28/2015   history of Fracture of right humerus  04/28/2015   HLD (hyperlipidemia) 03/05/2019   Hyperglycemia    Hyperlipidemia    Hypertension    Memory loss 04/28/2015   04/26/2014 MMSE 28/30. Failed clock drawing. 04/21/15 MMSE 21/30. Failed clock drawing.    Osteoporosis    Past Surgical History:  Procedure Laterality Date   ABDOMINAL HYSTERECTOMY     CATARACT EXTRACTION  2010   ELBOW SURGERY Right 1988   FEMUR FRACTURE SURGERY Right 06/2014   [place], [place] Dr. Dione Housekeeper   HUMERUS FRACTURE SURGERY Right 2015   [place], [place] Dr. Eber Hong   TONSILLECTOMY  [phone]    Allergies  Allergen Reactions   Ace Inhibitors Other (See Comments)    Unknown reaction   Almond Oil Rash   Plum Pulp Rash    Outpatient Encounter Medications as of 04/14/2023  Medication Sig   amLODipine (NORVASC) 5 MG tablet Take 5 mg by mouth daily.    atorvastatin (LIPITOR) 40 MG tablet Take 1 tablet (40 mg total) by mouth daily at 6 PM.   calcium carbonate (OSCAL) 1500 (600 Ca) MG TABS tablet Take 600 mg of elemental calcium by mouth daily with breakfast.   Cholecalciferol (VITAMIN D3) 1000 UNITS CAPS Take 1,000 Units daily by mouth.    clopidogrel (PLAVIX) 75 MG tablet Take 75 mg by mouth at bedtime.   donepezil (ARICEPT) 10 MG tablet TAKE ONE TABLET BY MOUTH DAILY TO PRESERVE  MEMORY   Emollient (CERAVE) CREA Apply 1 Application topically daily. Apply to back, face, legs, and arms.   memantine (NAMENDA) 10 MG tablet Take 1 tablet (10 mg total) by mouth 2 (two) times daily.   Methylcobalamin (B12-ACTIVE) 1 MG CHEW Chew 1 tablet by mouth 3 (three) times a week. Monday, Wednesday, and Friday.   Omega-3 Fatty Acids (FISH OIL) 500 MG CAPS Take 1 capsule by mouth at bedtime.   pantoprazole (PROTONIX) 40 MG tablet Take 1 tablet (40 mg total) by mouth daily.   sertraline (ZOLOFT) 25 MG tablet Take 75 mg by mouth daily.   triamcinolone cream (KENALOG) 0.1 % Apply 1 application. topically 2 (two) times daily as needed (For red area on left arm).   zinc oxide 20  % ointment Apply 1 application topically as needed for irritation. Apply to peri/buttocks after every incontinent episode   No facility-administered encounter medications on file as of 04/14/2023.    Review of Systems  Unable to perform ROS: Dementia    Immunization History  Administered Date(s) Administered   DTaP 05/12/2014   Fluad Quad(high Dose 65+) 10/06/2022   Influenza, High Dose Seasonal PF 09/21/2017, 09/27/2019, 09/16/2020   Influenza,inj,Quad PF,6+ Mos 09/14/2018   Influenza-Unspecified 10/07/2014, 09/11/2015, 09/23/2016, 10/07/2021   Moderna Sars-Covid-2 Vaccination 12/17/2019, 01/14/2020, 10/27/2020, 05/20/2021   PPD Test 10/16/2014   Pneumococcal Conjugate-13 10/20/2017   Pneumococcal Polysaccharide-23 08/20/2011   Unspecified SARS-COV-2 Vaccination 05/26/2022, 10/19/2022   Pertinent  Health Maintenance Due  Topic Date Due   DEXA SCAN  09/14/2023 (Originally 03/21/1993)   INFLUENZA VACCINE  07/14/2023   FOOT EXAM  09/14/2023   HEMOGLOBIN A1C  09/17/2023   OPHTHALMOLOGY EXAM  Discontinued      11/15/2022   11:53 AM 11/29/2022   10:08 AM 12/27/2022   10:25 AM 02/11/2023    1:47 PM 04/14/2023   11:07 AM  Fall Risk  Falls in the past year? 1 1 0 1 0  Was there an injury with Fall? 1 1 0 0 0  Fall Risk Category Calculator 3 3 0 1 0  Fall Risk Category (Retired) [employer]     (RETIRED) Patient Fall Risk Level High fall risk High fall risk     Patient at Risk for Falls Due to History of fall(s);Impaired balance/gait;Impaired mobility History of fall(s);Impaired balance/gait;Impaired mobility History of fall(s);Impaired balance/gait;Impaired mobility History of fall(s);Impaired balance/gait;Impaired mobility No Fall Risks  Fall risk Follow up Falls evaluation completed Falls evaluation completed Falls evaluation completed Falls evaluation completed;Education provided;Falls prevention discussed Falls evaluation completed   Functional Status Survey:    Vitals:   04/14/23  1055  BP: 136/73  Pulse: 65  Resp: 17  Temp: 97.7 F (36.5 C)  TempSrc: Temporal  SpO2: 94%  Weight: 142 lb 3.2 oz (64.5 kg)  Height: 5\' 2"  (1.575 m)   Body mass index is 26.01 kg/m. Physical Exam Vitals reviewed.  Constitutional:      Appearance: Normal appearance.  HENT:     Head: Normocephalic.     Nose: Nose normal.     Mouth/Throat:     Mouth: Mucous membranes are moist.     Pharynx: Oropharynx is clear.  Eyes:     Pupils: Pupils are equal, round, and reactive to light.  Cardiovascular:     Rate and Rhythm: Normal rate and regular rhythm.     Pulses: Normal pulses.     Heart sounds: Normal heart sounds. No murmur heard. Pulmonary:     Effort: Pulmonary effort  is normal.     Breath sounds: Normal breath sounds.  Abdominal:     General: Abdomen is flat. Bowel sounds are normal.     Palpations: Abdomen is soft.  Musculoskeletal:        General: No swelling.     Cervical back: Neck supple.  Skin:    General: Skin is warm.     Comments: Few healed vesicales in her Right hand where she picks on herself  Neurological:     General: No focal deficit present.     Mental Status: She is alert.  Psychiatric:        Mood and Affect: Mood normal.        Thought Content: Thought content normal.     Labs reviewed: Recent Labs    09/16/22 0000 02/03/23 0000 03/18/23 0000  NA 141 143 141  K 4.2 4.2 4.1  CL 105 106 106  CO2 32* 31* 32*  BUN 22* 31* 24*  CREATININE 1.0 1.2* 1.1  CALCIUM 9.0 9.4 8.8   Recent Labs    09/16/22 0000 02/03/23 0000 03/18/23 0000  AST 12* 12* 12*  ALT 14 16 13   ALKPHOS 69 71 58  ALBUMIN 3.5 4.0 3.5   Recent Labs    09/16/22 0000 02/03/23 0000 03/18/23 0000  WBC 8.4 10.5 7.2  NEUTROABS 4,864.00  --  4,032.00  HGB 10.9* 10.7* 9.4*  HCT 35* 34* 31*  PLT 221 264 217   Lab Results  Component Value Date   TSH 2.77 08/24/2021   Lab Results  Component Value Date   HGBA1C 6.2 03/18/2023   Lab Results  Component Value Date    CHOL 109 03/18/2023   HDL 40 03/18/2023   LDLCALC 52 03/18/2023   TRIG 87 03/18/2023   CHOLHDL 6.3 03/06/2019    Significant Diagnostic Results in last 30 days:  No results found.  Assessment/Plan 1. Primary hypertension Norvasc BP stable  2. Other hyperlipidemia Statin LDL 52  3. Stage 3a chronic kidney disease (HCC) Creat stable  4. Severe vascular dementia with other behavioral disturbance (HCC) Appropriate for SNF total ADLS care Aricept and Namenda 5. History of CVA (cerebrovascular accident) Plavix and statin  6. Anemia, chronic disease No Further work up due to her dementia  [employer] stores Will try Iron 325 mg 3/week 7. Depression with anxiety Zoloft  8. Skin-picking disorder Zoloft Stable right now  9. Gastroesophageal reflux disease without esophagitis Protonix    Family/ staff Communication:   Labs/tests ordered:

## 2023-05-16 ENCOUNTER — Non-Acute Institutional Stay (SKILLED_NURSING_FACILITY): Payer: PPO | Admitting: Orthopedic Surgery

## 2023-05-16 ENCOUNTER — Encounter: Payer: Self-pay | Admitting: Orthopedic Surgery

## 2023-05-16 DIAGNOSIS — N1831 Chronic kidney disease, stage 3a: Secondary | ICD-10-CM

## 2023-05-16 DIAGNOSIS — D638 Anemia in other chronic diseases classified elsewhere: Secondary | ICD-10-CM

## 2023-05-16 DIAGNOSIS — F01C18 Vascular dementia, severe, with other behavioral disturbance: Secondary | ICD-10-CM

## 2023-05-16 DIAGNOSIS — F418 Other specified anxiety disorders: Secondary | ICD-10-CM

## 2023-05-16 DIAGNOSIS — I10 Essential (primary) hypertension: Secondary | ICD-10-CM

## 2023-05-16 DIAGNOSIS — E7849 Other hyperlipidemia: Secondary | ICD-10-CM

## 2023-05-16 DIAGNOSIS — R7303 Prediabetes: Secondary | ICD-10-CM

## 2023-05-16 DIAGNOSIS — F424 Excoriation (skin-picking) disorder: Secondary | ICD-10-CM

## 2023-05-16 DIAGNOSIS — Z8673 Personal history of transient ischemic attack (TIA), and cerebral infarction without residual deficits: Secondary | ICD-10-CM

## 2023-05-16 DIAGNOSIS — K219 Gastro-esophageal reflux disease without esophagitis: Secondary | ICD-10-CM

## 2023-05-16 NOTE — Progress Notes (Signed)
Location:  [facility] Room Number: 18/A Place of Service:  SNF [phone]) Provider:  Octavia Heir, NP   [name], MD  Patient Care Team: [name], MD as PCP - General (Internal Medicine) Ngetich, Donalee Citrin, NP as Nurse Practitioner [facility] Medicine)  Extended Emergency Contact Information Primary Emergency Contact: Gordan,[place] Address: [address]          [place], [place] [address] Darden [place] of [place] Home Phone: [phone] Mobile Phone: [phone] Relation: Daughter  Code Status:  DNR Goals of care: Advanced Directive information    04/14/2023   11:08 AM  Advanced Directives  Does Patient Have a Medical Advance Directive? Yes  Type of Estate agent of [place];Living will;Out of facility DNR (pink MOST or yellow form)  Does patient want to make changes to medical advance directive? No - Patient declined  Copy of Healthcare Power of Attorney in Chart? Yes - validated most recent copy scanned in chart (See row information)     Chief Complaint  Patient presents with   Medical Management of Chronic Issues    HPI:  Pt is a 89 y.o. female seen today for medical management of chronic diseases.    She currently resides on the skilled nursing unit at [facility] Past medical conditions include: hypertension, CVA, dementia, GERD, diabetes, CKD stage 3, hyperlipidemia, depression, anxiety and abnormal gait.    Anemia- Hgb 9.4 (04/05)> was 10.7 ( 02/22), 05/03 started on ferrous sulfate 3x/week HTN- BUN/creat 24/1.1 03/18/2023, remains on amlodipine HLD- LDL 55 03/26/2022, remains on Lipitor> AST/ALT 12/13 (04/05) CKD- BUN/creat 24/1.1 GFR 49 03/18/2023 Prediabetes- a1c 6.2 (04/05)> was 5.9 09/16/2022, remains on regular diet Dementia- CT head 02/2019 confirmed small white matter disease, BIMS 12/15 (03/12)> was 9/15 (12/12), no recent behaviors, dependent with ADLs except feeding, remains on Aricept and Namenda  HX  CVA- hx acute infarct on left Para median pons, remains on plavix and statin Depression/Anxiety- no mood changes, Na+ 141 03/18/2023, remains on Zoloft  Skin picking- remains on Kenalog cream prn GERD- remains on Protonix  No recent falls or injuries.   Recent blood pressures:  06/02- 135/68  06/01- 127/75  05/31- 133/63  Recent weights:  06/01- 140.4 lbs  05/01- 142.2 lbs  04/01- 144.6 lbs    Past Medical History:  Diagnosis Date   Allergic rhinitis    Anxiety    Balance problem 04/28/2015   Fatigue 04/28/2015   Hearing loss 04/28/2015   history of Fracture of right humerus 04/28/2015   HLD (hyperlipidemia) 03/05/2019   Hyperglycemia    Hyperlipidemia    Hypertension    Memory loss 04/28/2015   04/26/2014 MMSE 28/30. Failed clock drawing. 04/21/15 MMSE 21/30. Failed clock drawing.    Osteoporosis    Past Surgical History:  Procedure Laterality Date   ABDOMINAL HYSTERECTOMY     CATARACT EXTRACTION  2010   ELBOW SURGERY Right 1988   FEMUR FRACTURE SURGERY Right 06/2014   [place], [place] Dr. Dione Housekeeper   HUMERUS FRACTURE SURGERY Right 2015   [place], [place] Dr. Eber Hong   TONSILLECTOMY  [phone]    Allergies  Allergen Reactions   Ace Inhibitors Other (See Comments)    Unknown reaction   Almond Oil Rash   Plum Pulp Rash    Outpatient Encounter Medications as of 05/16/2023  Medication Sig   amLODipine (NORVASC) 5 MG tablet Take 5 mg by mouth daily.    atorvastatin (LIPITOR) 40 MG tablet Take  1 tablet (40 mg total) by mouth daily at 6 PM.   calcium carbonate (OSCAL) 1500 (600 Ca) MG TABS tablet Take 600 mg of elemental calcium by mouth daily with breakfast.   Cholecalciferol (VITAMIN D3) 1000 UNITS CAPS Take 1,000 Units daily by mouth.    clopidogrel (PLAVIX) 75 MG tablet Take 75 mg by mouth at bedtime.   donepezil (ARICEPT) 10 MG tablet TAKE ONE TABLET BY MOUTH DAILY TO PRESERVE MEMORY   Emollient (CERAVE) CREA Apply 1 Application topically daily. Apply to back, face,  legs, and arms.   memantine (NAMENDA) 10 MG tablet Take 1 tablet (10 mg total) by mouth 2 (two) times daily.   Methylcobalamin (B12-ACTIVE) 1 MG CHEW Chew 1 tablet by mouth 3 (three) times a week. Monday, Wednesday, and Friday.   Omega-3 Fatty Acids (FISH OIL) 500 MG CAPS Take 1 capsule by mouth at bedtime.   pantoprazole (PROTONIX) 40 MG tablet Take 1 tablet (40 mg total) by mouth daily.   sertraline (ZOLOFT) 25 MG tablet Take 75 mg by mouth daily.   triamcinolone cream (KENALOG) 0.1 % Apply 1 application. topically 2 (two) times daily as needed (For red area on left arm).   zinc oxide 20 % ointment Apply 1 application topically as needed for irritation. Apply to peri/buttocks after every incontinent episode   No facility-administered encounter medications on file as of 05/16/2023.    Review of Systems  Constitutional:  Negative for activity change and appetite change.  HENT:  Negative for trouble swallowing.   Eyes:  Negative for visual disturbance.  Respiratory:  Negative for cough, shortness of breath and wheezing.   Cardiovascular:  Negative for chest pain and leg swelling.  Gastrointestinal:  Negative for abdominal distention, abdominal pain and blood in stool.  Genitourinary:  Negative for dysuria.  Musculoskeletal:  Positive for gait problem.  Skin:  Positive for wound.  Neurological:  Positive for weakness. Negative for dizziness and headaches.  Psychiatric/Behavioral:  Positive for confusion and dysphoric mood. Negative for sleep disturbance. The patient is nervous/anxious.     Immunization History  Administered Date(s) Administered   DTaP 05/12/2014   Fluad Quad(high Dose 65+) 10/06/2022   Influenza, High Dose Seasonal PF 09/21/2017, 09/27/2019, 09/16/2020   Influenza,inj,Quad PF,6+ Mos 09/14/2018   Influenza-Unspecified 10/07/2014, 09/11/2015, 09/23/2016, 10/07/2021   Moderna Sars-Covid-2 Vaccination 12/17/2019, 01/14/2020, 10/27/2020, 05/20/2021   PPD Test 10/16/2014    Pneumococcal Conjugate-13 10/20/2017   Pneumococcal Polysaccharide-23 08/20/2011   Unspecified SARS-COV-2 Vaccination 05/26/2022, 10/19/2022   Pertinent  Health Maintenance Due  Topic Date Due   DEXA SCAN  09/14/2023 (Originally 03/21/1993)   INFLUENZA VACCINE  07/14/2023   FOOT EXAM  09/14/2023   HEMOGLOBIN A1C  09/17/2023   OPHTHALMOLOGY EXAM  Discontinued      11/15/2022   11:53 AM 11/29/2022   10:08 AM 12/27/2022   10:25 AM 02/11/2023    1:47 PM 04/14/2023   11:07 AM  Fall Risk  Falls in the past year? 1 1 0 1 0  Was there an injury with Fall? 1 1 0 0 0  Fall Risk Category Calculator 3 3 0 1 0  Fall Risk Category (Retired) [employer]     (RETIRED) Patient Fall Risk Level High fall risk High fall risk     Patient at Risk for Falls Due to History of fall(s);Impaired balance/gait;Impaired mobility History of fall(s);Impaired balance/gait;Impaired mobility History of fall(s);Impaired balance/gait;Impaired mobility History of fall(s);Impaired balance/gait;Impaired mobility No Fall Risks  Fall risk Follow up Falls evaluation  completed Falls evaluation completed Falls evaluation completed Falls evaluation completed;Education provided;Falls prevention discussed Falls evaluation completed   Functional Status Survey:    Vitals:   05/16/23 0941  BP: 135/68  Pulse: 65  Resp: 19  Temp: (!) 97.5 F (36.4 C)  SpO2: 94%  Weight: 140 lb 6.4 oz (63.7 kg)  Height: 5\' 2"  (1.575 m)   Body mass index is 25.68 kg/m. Physical Exam Vitals reviewed.  Constitutional:      General: She is not in acute distress. HENT:     Head: Normocephalic.     Right Ear: There is no impacted cerumen.     Left Ear: There is no impacted cerumen.     Nose: Nose normal.     Mouth/Throat:     Mouth: Mucous membranes are moist.  Eyes:     General:        Right eye: No discharge.        Left eye: No discharge.  Cardiovascular:     Rate and Rhythm: Normal rate and regular rhythm.     Pulses: Normal pulses.      Heart sounds: Normal heart sounds.  Pulmonary:     Effort: Pulmonary effort is normal. No respiratory distress.     Breath sounds: Normal breath sounds. No wheezing.  Abdominal:     General: Bowel sounds are normal. There is no distension.     Palpations: Abdomen is soft.     Tenderness: There is no abdominal tenderness.  Musculoskeletal:     Cervical back: Neck supple.     Right lower leg: No edema.     Left lower leg: No edema.  Skin:    General: Skin is warm.     Capillary Refill: Capillary refill takes less than 2 seconds.     Comments: Pea sized open sore to right shin, CDI, no sign of infection  Neurological:     General: No focal deficit present.     Mental Status: She is alert. Mental status is at baseline.     Motor: Weakness present.     Gait: Gait abnormal.  Psychiatric:        Mood and Affect: Mood normal.     Comments: Follows commands, alert to self/familiar face     Labs reviewed: Recent Labs    09/16/22 0000 02/03/23 0000 03/18/23 0000  NA 141 143 141  K 4.2 4.2 4.1  CL 105 106 106  CO2 32* 31* 32*  BUN 22* 31* 24*  CREATININE 1.0 1.2* 1.1  CALCIUM 9.0 9.4 8.8   Recent Labs    09/16/22 0000 02/03/23 0000 03/18/23 0000  AST 12* 12* 12*  ALT 14 16 13   ALKPHOS 69 71 58  ALBUMIN 3.5 4.0 3.5   Recent Labs    09/16/22 0000 02/03/23 0000 03/18/23 0000  WBC 8.4 10.5 7.2  NEUTROABS 4,864.00  --  4,032.00  HGB 10.9* 10.7* 9.4*  HCT 35* 34* 31*  PLT 221 264 217   Lab Results  Component Value Date   TSH 2.77 08/24/2021   Lab Results  Component Value Date   HGBA1C 6.2 03/18/2023   Lab Results  Component Value Date   CHOL 109 03/18/2023   HDL 40 03/18/2023   LDLCALC 52 03/18/2023   TRIG 87 03/18/2023   CHOLHDL 6.3 03/06/2019    Significant Diagnostic Results in last 30 days:  No results found.  Assessment/Plan 1. Anemia, chronic disease - Hgb 9.4 (04/05)> was 10.7 ( 02/22) -  no aggressive workup due to age/dementia - on  Protonix - ferrous sulfate 3x/week started 04/2023  2. Primary hypertension - controlled with amlodipine  3. Other hyperlipidemia - cont atorvastatin   4. Stage 3a chronic kidney disease (HCC) - GFR 49 (04/05) - encourage hydration with water - avoid NSAIDS  5. Prediabetes - A1c stable - diet controlled  6. Severe vascular dementia with other behavioral disturbance (HCC) - no behaviors - dependent with ADLs except feeding - weight stable - ambulates with w/c - cont Aricept and Namenda - cont skilled nursing  7. History of CVA (cerebrovascular accident) - cont atorvastatin and Plavix  8. Depression with anxiety - Na+ stable - cont sertraline  9. Skin-picking disorder - ongoing - associated with dementia - small lesion to right shin> no infection - cont triamcinolone prn  10. Gastroesophageal reflux disease without esophagitis - see above -cont Protonix    Family/ staff Communication: plan discussed with patient and nurse  Labs/tests ordered:  none

## 2023-06-17 ENCOUNTER — Non-Acute Institutional Stay (SKILLED_NURSING_FACILITY): Payer: PPO | Admitting: Orthopedic Surgery

## 2023-06-17 ENCOUNTER — Encounter: Payer: Self-pay | Admitting: Orthopedic Surgery

## 2023-06-17 DIAGNOSIS — F418 Other specified anxiety disorders: Secondary | ICD-10-CM

## 2023-06-17 DIAGNOSIS — E782 Mixed hyperlipidemia: Secondary | ICD-10-CM

## 2023-06-17 DIAGNOSIS — L853 Xerosis cutis: Secondary | ICD-10-CM | POA: Diagnosis not present

## 2023-06-17 DIAGNOSIS — R7303 Prediabetes: Secondary | ICD-10-CM

## 2023-06-17 DIAGNOSIS — I10 Essential (primary) hypertension: Secondary | ICD-10-CM | POA: Diagnosis not present

## 2023-06-17 DIAGNOSIS — I6302 Cerebral infarction due to thrombosis of basilar artery: Secondary | ICD-10-CM

## 2023-06-17 DIAGNOSIS — N1831 Chronic kidney disease, stage 3a: Secondary | ICD-10-CM

## 2023-06-17 DIAGNOSIS — F01C18 Vascular dementia, severe, with other behavioral disturbance: Secondary | ICD-10-CM

## 2023-06-17 DIAGNOSIS — F424 Excoriation (skin-picking) disorder: Secondary | ICD-10-CM

## 2023-06-17 DIAGNOSIS — K219 Gastro-esophageal reflux disease without esophagitis: Secondary | ICD-10-CM

## 2023-06-17 DIAGNOSIS — D638 Anemia in other chronic diseases classified elsewhere: Secondary | ICD-10-CM | POA: Diagnosis not present

## 2023-06-17 NOTE — Progress Notes (Signed)
Location:   [facility] Room Number: 18 Place of Service:  SNF [phone]) Provider:  Hazle Nordmann, NP  [name], MD  Patient Care Team: [name], MD as PCP - General (Internal Medicine) Ngetich, Donalee Citrin, NP as Nurse Practitioner [facility])  Extended Emergency Contact Information Primary Emergency Contact: Gordan,[place] Address: [address]          [place], [place] [address] Darden Amber of [place] Home Phone: [phone] Mobile Phone: [phone] Relation: Daughter  Code Status:  DNR Goals of care: Advanced Directive information    06/17/2023   11:15 AM  Advanced Directives  Does Patient Have a Medical Advance Directive? Yes  Type of Estate agent of [place];Living will;Out of facility DNR (pink MOST or yellow form)  Does patient want to make changes to medical advance directive? No - Patient declined  Copy of Healthcare Power of Attorney in Chart? Yes - validated most recent copy scanned in chart (See row information)  Pre-existing out of facility DNR order (yellow form or pink MOST form) Yellow form placed in chart (order not valid for inpatient use)     Chief Complaint  Patient presents with   Medical Management of Chronic Issues    Routine follow up   Quality Metric Gaps    Medicare annual wellness due    HPI:  Pt is a 89 y.o. female seen today for medical management of chronic diseases.    She currently resides on the skilled nursing unit at [facility]. Past medical conditions include: hypertension, CVA, dementia, GERD, diabetes, CKD stage 3, hyperlipidemia, depression, anxiety and abnormal gait.   Dry skin - using cerave lotion daily  Anemia- Hgb 9.4 (04/05)> was 10.7 ( 02/22), 05/03 started on ferrous sulfate 3x/week HTN- BUN/creat 24/1.1 03/18/2023, remains on amlodipine HLD- LDL 55 03/26/2022, remains on Lipitor> AST/ALT 12/13 (04/05) CKD- BUN/creat 24/1.1 GFR 49 03/18/2023 Prediabetes- a1c 6.2  (04/05)> was 5.9 09/16/2022, remains on regular diet Dementia- CT head 02/2019 confirmed small white matter disease, BIMS 13/15 (06/19)> was 12/15 (03/12), no recent behaviors, dependent with ADLs except feeding, remains on Aricept and Namenda  HX CVA- hx acute infarct on left Para median pons, remains on plavix and statin Depression/Anxiety- no mood changes, Na+ 141 03/18/2023, remains on Zoloft  Skin picking- remains on Kenalog cream prn GERD- remains on Protonix  No recent falls or injuries.   Recent weights:  07/05- 139 lbs  06/01- 140.4 lbs  05/01- 142.2 lbs  Recent blood pressures:  07/05- 134/79  07/04- 133/75  07/03- 137/75      Past Medical History:  Diagnosis Date   Allergic rhinitis    Anxiety    Balance problem 04/28/2015   Fatigue 04/28/2015   Hearing loss 04/28/2015   history of Fracture of right humerus 04/28/2015   HLD (hyperlipidemia) 03/05/2019   Hyperglycemia    Hyperlipidemia    Hypertension    Memory loss 04/28/2015   04/26/2014 MMSE 28/30. Failed clock drawing. 04/21/15 MMSE 21/30. Failed clock drawing.    Osteoporosis    Past Surgical History:  Procedure Laterality Date   ABDOMINAL HYSTERECTOMY     CATARACT EXTRACTION  2010   ELBOW SURGERY Right 1988   FEMUR FRACTURE SURGERY Right 06/2014   [place], [place] Dr. Dione Housekeeper   HUMERUS FRACTURE SURGERY Right 2015   [place], [place] Dr. Eber Hong   TONSILLECTOMY  [phone]    Allergies  Allergen Reactions   Ace Inhibitors Other (See  Comments)    Unknown reaction   Almond Oil Rash   Plum Pulp Rash    Allergies as of 06/17/2023       Reactions   Ace Inhibitors Other (See Comments)   Unknown reaction   Almond Oil Rash   Plum Pulp Rash        Medication List        Accurate as of June 17, 2023 11:24 AM. If you have any questions, ask your nurse or doctor.          amLODipine 5 MG tablet Commonly known as: NORVASC Take 5 mg by mouth daily.   atorvastatin 40 MG tablet Commonly known  as: LIPITOR Take 1 tablet (40 mg total) by mouth daily at 6 PM.   B12-Active 1 MG Chew Generic drug: Methylcobalamin Chew 1 tablet by mouth 3 (three) times a week. Monday, Wednesday, and Friday.   calcium carbonate 1500 (600 Ca) MG Tabs tablet Commonly known as: OSCAL Take 600 mg of elemental calcium by mouth daily with breakfast.   CeraVe Crea Apply 1 Application topically daily. Apply to back, face, legs, and arms.   clopidogrel 75 MG tablet Commonly known as: PLAVIX Take 75 mg by mouth at bedtime.   donepezil 10 MG tablet Commonly known as: ARICEPT TAKE ONE TABLET BY MOUTH DAILY TO PRESERVE MEMORY   ferrous sulfate 325 (65 FE) MG EC tablet Take 325 mg by mouth 3 (three) times a week. Give M/W/F   Fish Oil 500 MG Caps Take 1 capsule by mouth at bedtime.   memantine 10 MG tablet Commonly known as: Namenda Take 1 tablet (10 mg total) by mouth 2 (two) times daily.   pantoprazole 40 MG tablet Commonly known as: Protonix Take 1 tablet (40 mg total) by mouth daily.   sertraline 25 MG tablet Commonly known as: ZOLOFT Take 75 mg by mouth daily.   triamcinolone cream 0.1 % Commonly known as: KENALOG Apply 1 application. topically 2 (two) times daily as needed (For red area on left arm).   Vitamin D3 25 MCG (1000 UT) Caps Take 1,000 Units daily by mouth.   zinc oxide 20 % ointment Apply 1 application topically as needed for irritation. Apply to peri/buttocks after every incontinent episode        Review of Systems  Immunization History  Administered Date(s) Administered   DTaP 05/12/2014   Fluad Quad(high Dose 65+) 10/06/2022   Influenza, High Dose Seasonal PF 09/21/2017, 09/27/2019, 09/16/2020   Influenza,inj,Quad PF,6+ Mos 09/14/2018   Influenza-Unspecified 10/07/2014, 09/11/2015, 09/23/2016, 10/07/2021   Moderna Sars-Covid-2 Vaccination 12/17/2019, 01/14/2020, 10/27/2020, 05/20/2021   PPD Test 10/16/2014   Pneumococcal Conjugate-13 10/20/2017    Pneumococcal Polysaccharide-23 08/20/2011   Unspecified SARS-COV-2 Vaccination 05/26/2022, 10/19/2022   Pertinent  Health Maintenance Due  Topic Date Due   DEXA SCAN  09/14/2023 (Originally 03/21/1991)   INFLUENZA VACCINE  07/14/2023   HEMOGLOBIN A1C  09/17/2023   FOOT EXAM  05/15/2024   OPHTHALMOLOGY EXAM  Discontinued      11/29/2022   10:08 AM 12/27/2022   10:25 AM 02/11/2023    1:47 PM 04/14/2023   11:07 AM 05/16/2023   12:21 PM  Fall Risk  Falls in the past year? 1 0 1 0 1  Was there an injury with Fall? 1 0 0 0 0  Fall Risk Category Calculator 3 0 1 0 1  Fall Risk Category (Retired) High      (RETIRED) Patient Fall Risk Level High fall risk  Patient at Risk for Falls Due to History of fall(s);Impaired balance/gait;Impaired mobility History of fall(s);Impaired balance/gait;Impaired mobility History of fall(s);Impaired balance/gait;Impaired mobility No Fall Risks History of fall(s);Impaired balance/gait;Impaired mobility  Fall risk Follow up Falls evaluation completed Falls evaluation completed Falls evaluation completed;Education provided;Falls prevention discussed Falls evaluation completed Falls evaluation completed;Education provided;Falls prevention discussed   Functional Status Survey:    Vitals:   06/17/23 1105  BP: 134/79  Pulse: 72  Resp: 20  Temp: 98 F (36.7 C)  SpO2: 98%  Weight: 139 lb (63 kg)  Height: 5\' 2"  (1.575 m)   Body mass index is 25.42 kg/m. Physical Exam Vitals reviewed.  Constitutional:      General: She is not in acute distress. HENT:     Head: Normocephalic.     Right Ear: There is no impacted cerumen.     Left Ear: There is no impacted cerumen.     Nose: Nose normal.     Mouth/Throat:     Mouth: Mucous membranes are moist.  Eyes:     General:        Right eye: No discharge.        Left eye: No discharge.  Neck:     Vascular: No carotid bruit.  Cardiovascular:     Rate and Rhythm: Normal rate and regular rhythm.     Pulses: Normal  pulses.     Heart sounds: Normal heart sounds.  Pulmonary:     Effort: Pulmonary effort is normal. No respiratory distress.     Breath sounds: Normal breath sounds. No wheezing.  Abdominal:     General: Bowel sounds are normal. There is no distension.     Palpations: Abdomen is soft.     Tenderness: There is no abdominal tenderness.  Musculoskeletal:     Cervical back: Neck supple.     Right lower leg: No edema.     Left lower leg: No edema.  Lymphadenopathy:     Cervical: No cervical adenopathy.  Skin:    General: Skin is warm.     Capillary Refill: Capillary refill takes less than 2 seconds.     Comments: Small pea sized open lesion to left nose near inner canthus, CDI, surrounding skin intact. Right shin skin tear healed. Overall skin with dry appearance.   Neurological:     General: No focal deficit present.     Mental Status: She is alert. Mental status is at baseline.     Motor: Weakness present.     Gait: Gait abnormal.     Comments: wheelchair  Psychiatric:        Mood and Affect: Mood normal.     Labs reviewed: Recent Labs    09/16/22 0000 02/03/23 0000 03/18/23 0000  NA 141 143 141  K 4.2 4.2 4.1  CL 105 106 106  CO2 32* 31* 32*  BUN 22* 31* 24*  CREATININE 1.0 1.2* 1.1  CALCIUM 9.0 9.4 8.8   Recent Labs    09/16/22 0000 02/03/23 0000 03/18/23 0000  AST 12* 12* 12*  ALT 14 16 13   ALKPHOS 69 71 58  ALBUMIN 3.5 4.0 3.5   Recent Labs    09/16/22 0000 02/03/23 0000 03/18/23 0000  WBC 8.4 10.5 7.2  NEUTROABS 4,864.00  --  4,032.00  HGB 10.9* 10.7* 9.4*  HCT 35* 34* 31*  PLT 221 264 217   Lab Results  Component Value Date   TSH 2.77 08/24/2021   Lab Results  Component Value Date  HGBA1C 6.2 03/18/2023   Lab Results  Component Value Date   CHOL 109 03/18/2023   HDL 40 03/18/2023   LDLCALC 52 03/18/2023   TRIG 87 03/18/2023   CHOLHDL 6.3 03/06/2019    Significant Diagnostic Results in last 30 days:  No results  found.  Assessment/Plan 1. Dry skin - ongoing - less flaking - cont Cerave daily  2. Anemia, chronic disease - hgb 9.4  - started on ferrous sulfate 05/2023  3. Essential hypertension - controlled with amlodipine  4. Mixed hyperlipidemia - cont atorvastatin  5. Stage 3a chronic kidney disease (HCC) - encourage hydration with water - avoid NSAIDS  6. Prediabetes - diet controlled  7. Severe vascular dementia with other behavioral disturbance (HCC) - no behaviors - weights stable - dependent with ADLs except feeding - cont Aricept and Namenda  8. Cerebrovascular accident (CVA) due to thrombosis of basilar artery (HCC) - cont statin and Plavix  9. Depression with anxiety - no mood changes - cont Zoloft  10. Skin-picking disorder - ongoing - associated with dementia - one lesion to left nose> CDI - cont kenalog cream prn  11. Gastroesophageal reflux disease without esophagitis - cont Protonix    Family/ staff Communication: plan discussed with patient and nurse  Labs/tests ordered: none

## 2023-06-27 ENCOUNTER — Encounter: Payer: Self-pay | Admitting: Orthopedic Surgery

## 2023-06-27 ENCOUNTER — Non-Acute Institutional Stay: Payer: Self-pay | Admitting: Orthopedic Surgery

## 2023-06-27 DIAGNOSIS — F424 Excoriation (skin-picking) disorder: Secondary | ICD-10-CM | POA: Diagnosis not present

## 2023-06-27 DIAGNOSIS — F01C18 Vascular dementia, severe, with other behavioral disturbance: Secondary | ICD-10-CM

## 2023-06-27 MED ORDER — HYDROCORTISONE 0.5 % EX CREA: 1.0000 | TOPICAL_CREAM | Freq: Two times a day (BID) | CUTANEOUS | Status: AC

## 2023-06-27 NOTE — Progress Notes (Signed)
Location:   [facility] Room Number: 18-A Place of Service:  SNF [phone]) Provider:  Hazle Nordmann, NP  PCP: [name], MD  Patient Care Team: [name], MD as PCP - General (Internal Medicine) Ngetich, Donalee Citrin, NP as Nurse Practitioner (Family Medicine)  Extended Emergency Contact Information Primary Emergency Contact: Gordan,[place] Address: [address]          [place], [place] [address] [place] of [place] Home Phone: [phone] Mobile Phone: [phone] Relation: Daughter  Code Status:  DNR Goals of care: Advanced Directive information    06/27/2023   10:10 AM  Advanced Directives  Does Patient Have a Medical Advance Directive? Yes  Type of Estate agent of [place];Living will;Out of facility DNR (pink MOST or yellow form)  Does patient want to make changes to medical advance directive? No - Patient declined  Copy of Healthcare Power of Attorney in Chart? Yes - validated most recent copy scanned in chart (See row information)     Chief Complaint  Patient presents with   Acute Visit    Rash    HPI:  Pt is a 89 y.o. female seen today for an acute visit facial rash.   She currently resides on the skilled nursing unit at [facility]. Past medical conditions include: hypertension, CVA, dementia, GERD, diabetes, CKD stage 3, hyperlipidemia, depression, anxiety and abnormal gait.    H/o dry skin and skin picking. She has 2 large open lesions on face near nose. She denies pain or itching. She has kenalog cream prn, but unclear if it has been used. Afebrile. Vitals stable.   No recent mood changes or behaviors. Remains on Zoloft, Aricept and Namenda.    Past Medical History:  Diagnosis Date   Allergic rhinitis    Anxiety    Balance problem 04/28/2015   Fatigue 04/28/2015   Hearing loss 04/28/2015   history of Fracture of right humerus 04/28/2015   HLD (hyperlipidemia) 03/05/2019   Hyperglycemia    Hyperlipidemia     Hypertension    Memory loss 04/28/2015   04/26/2014 MMSE 28/30. Failed clock drawing. 04/21/15 MMSE 21/30. Failed clock drawing.    Osteoporosis    Past Surgical History:  Procedure Laterality Date   ABDOMINAL HYSTERECTOMY     CATARACT EXTRACTION  2010   ELBOW SURGERY Right 1988   FEMUR FRACTURE SURGERY Right 06/2014   [place], [place] Dr. Dione Housekeeper   HUMERUS FRACTURE SURGERY Right 2015   [place], [place] Dr. Eber Hong   TONSILLECTOMY  [phone]    Allergies  Allergen Reactions   Ace Inhibitors Other (See Comments)    Unknown reaction   Almond Oil Rash   Plum Pulp Rash    Allergies as of 06/27/2023       Reactions   Ace Inhibitors Other (See Comments)   Unknown reaction   Almond Oil Rash   Plum Pulp Rash        Medication List        Accurate as of June 27, 2023 10:11 AM. If you have any questions, ask your nurse or doctor.          STOP taking these medications    B12-Active 1 MG Chew Generic drug: Methylcobalamin Stopped by: Ulric Salzman E Treylan Mcclintock       TAKE these medications    amLODipine 5 MG tablet Commonly known as: NORVASC Take 5 mg by mouth daily.   atorvastatin 40 MG tablet Commonly known as:  LIPITOR Take 1 tablet (40 mg total) by mouth daily at 6 PM.   calcium carbonate 1500 (600 Ca) MG Tabs tablet Commonly known as: OSCAL Take 600 mg of elemental calcium by mouth daily with breakfast.   CeraVe Crea Apply 1 Application topically daily. Apply to back, face, legs, and arms.   clopidogrel 75 MG tablet Commonly known as: PLAVIX Take 75 mg by mouth at bedtime.   cyanocobalamin 1000 MCG tablet Commonly known as: VITAMIN B12 Take 1,000 mcg by mouth 3 (three) times a week. Monday, Wednesday, and Friday.   donepezil 10 MG tablet Commonly known as: ARICEPT TAKE ONE TABLET BY MOUTH DAILY TO PRESERVE MEMORY   ferrous sulfate 325 (65 FE) MG EC tablet Take 325 mg by mouth 3 (three) times a week. Give M/W/F   Fish Oil 500 MG Caps Take 1 capsule by  mouth at bedtime.   memantine 10 MG tablet Commonly known as: Namenda Take 1 tablet (10 mg total) by mouth 2 (two) times daily.   pantoprazole 40 MG tablet Commonly known as: Protonix Take 1 tablet (40 mg total) by mouth daily.   sertraline 25 MG tablet Commonly known as: ZOLOFT Take 75 mg by mouth daily.   triamcinolone cream 0.1 % Commonly known as: KENALOG Apply 1 application. topically 2 (two) times daily as needed (For red area on left arm).   Vitamin D3 25 MCG (1000 UT) Caps Take 1,000 Units daily by mouth.   zinc oxide 20 % ointment Apply 1 application topically as needed for irritation. Apply to peri/buttocks after every incontinent episode        Review of Systems  Unable to perform ROS: Dementia    Immunization History  Administered Date(s) Administered   DTaP 05/12/2014   Fluad Quad(high Dose 65+) 10/06/2022   Influenza, High Dose Seasonal PF 09/21/2017, 09/27/2019, 09/16/2020   Influenza,inj,Quad PF,6+ Mos 09/14/2018   Influenza-Unspecified 10/07/2014, 09/11/2015, 09/23/2016, 10/07/2021   Moderna Sars-Covid-2 Vaccination 12/17/2019, 01/14/2020, 10/27/2020, 05/20/2021   PPD Test 10/16/2014   Pneumococcal Conjugate-13 10/20/2017   Pneumococcal Polysaccharide-23 08/20/2011   Unspecified SARS-COV-2 Vaccination 05/26/2022, 10/19/2022   Pertinent  Health Maintenance Due  Topic Date Due   DEXA SCAN  09/14/2023 (Originally [birth year]/21/1993)   INFLUENZA VACCINE  07/14/2023   HEMOGLOBIN A1C  09/17/2023   FOOT EXAM  06/16/2024   OPHTHALMOLOGY EXAM  Discontinued      12/27/2022   10:25 AM 02/11/2023    1:47 PM 04/14/2023   11:07 AM 05/16/2023   12:21 PM 06/17/2023    1:36 PM  Fall Risk  Falls in the past year? 0 1 0 1 1  Was there an injury with Fall? 0 0 0 0 0  Fall Risk Category Calculator 0 1 0 1 1  Patient at Risk for Falls Due to History of fall(s);Impaired balance/gait;Impaired mobility History of fall(s);Impaired balance/gait;Impaired mobility No Fall Risks  History of fall(s);Impaired balance/gait;Impaired mobility History of fall(s);Impaired balance/gait  Fall risk Follow up Falls evaluation completed Falls evaluation completed;Education provided;Falls prevention discussed Falls evaluation completed Falls evaluation completed;Education provided;Falls prevention discussed Falls evaluation completed;Education provided;Falls prevention discussed   Functional Status Survey:    Vitals:   06/27/23 1003  BP: 133/74  Pulse: 79  Resp: 19  Temp: 98 F (36.7 C)  SpO2: 98%  Weight: 139 lb (63 kg)  Height: 5\' 2"  (1.575 m)   Body mass index is 25.42 kg/m. Physical Exam Vitals reviewed.  Constitutional:      General: She is not in  acute distress. HENT:     Head: Normocephalic.     Nose:     Comments: Approx 0.25 open lesion to right nostril and left inner canthus, no sign of infection.  Eyes:     General:        Right eye: No discharge.        Left eye: No discharge.  Cardiovascular:     Rate and Rhythm: Normal rate and regular rhythm.     Pulses: Normal pulses.     Heart sounds: Normal heart sounds.  Pulmonary:     Effort: Pulmonary effort is normal.     Breath sounds: Normal breath sounds.  Skin:    General: Skin is dry.     Capillary Refill: Capillary refill takes less than 2 seconds.     Comments: Scratch marks to upper arms, overall skin appearance dry   Neurological:     General: No focal deficit present.     Mental Status: She is alert. Mental status is at baseline.     Motor: Weakness present.     Gait: Gait abnormal.  Psychiatric:        Mood and Affect: Mood normal.     Labs reviewed: Recent Labs    09/16/22 0000 02/03/23 0000 03/18/23 0000  NA 141 143 141  K 4.2 4.2 4.1  CL 105 106 106  CO2 32* 31* 32*  BUN 22* 31* 24*  CREATININE 1.0 1.2* 1.1  CALCIUM 9.0 9.4 8.8   Recent Labs    09/16/22 0000 02/03/23 0000 03/18/23 0000  AST 12* 12* 12*  ALT 14 16 13   ALKPHOS 69 71 58  ALBUMIN 3.5 4.0 3.5    Recent Labs    09/16/22 0000 02/03/23 0000 03/18/23 0000  WBC 8.4 10.5 7.2  NEUTROABS 4,864.00  --  4,032.00  HGB 10.9* 10.7* 9.4*  HCT 35* 34* 31*  PLT 221 264 217   Lab Results  Component Value Date   TSH 2.77 08/24/2021   Lab Results  Component Value Date   HGBA1C 6.2 03/18/2023   Lab Results  Component Value Date   CHOL 109 03/18/2023   HDL 40 03/18/2023   LDLCALC 52 03/18/2023   TRIG 87 03/18/2023   CHOLHDL 6.3 03/06/2019    Significant Diagnostic Results in last 30 days:  No results found.  Assessment/Plan: 1. Skin-picking disorder - ongoing - associated with chronic dry skin and dementia - 2 large areas near nose, CDI - will try hydrocortisone cream for face - cont kenalog cream for extremities - cont Cerave cream daily - consider hydroxyzine 12.5 mg prn if itching persisits - hydrocortisone cream 0.5 %; Apply 1 Application topically 2 (two) times daily for 5 days.  2. Severe vascular dementia with other behavioral disturbance (HCC) - no behaviors - weights stable - ambulates with wheelchair - cont Aricept and Namenda   Family/ staff Communication: plan discussed with patient and nurse  Labs/tests ordered:  none

## 2023-07-13 ENCOUNTER — Non-Acute Institutional Stay (INDEPENDENT_AMBULATORY_CARE_PROVIDER_SITE_OTHER): Payer: PPO | Admitting: Orthopedic Surgery

## 2023-07-13 ENCOUNTER — Encounter: Payer: Self-pay | Admitting: Orthopedic Surgery

## 2023-07-13 DIAGNOSIS — Z0000 Encounter for general adult medical examination without abnormal findings: Secondary | ICD-10-CM | POA: Diagnosis not present

## 2023-07-13 NOTE — Patient Instructions (Addendum)
  Ana [name] , Thank you for taking time to come for your Medicare Wellness Visit. I appreciate your ongoing commitment to your health goals. Please review the following plan we discussed and let me know if I can assist you in the future.   These are the goals we discussed:  Goals      DIET - INCREASE WATER INTAKE     Maintain Ilfestlye     Starting today pt will maintain lifestyle.      Maintain Mobility and Function     Evidence-based guidance:  Emphasize the importance of physical activity and aerobic exercise as included in treatment plan; assess barriers to adherence; consider patient's abilities and preferences.  Encourage gradual increase in activity or exercise instead of stopping if pain occurs.  Reinforce individual therapy exercise prescription, such as strengthening, stabilization and stretching programs.  Promote optimal body mechanics to stabilize the spine with lifting and functional activity.  Encourage activity and mobility modifications to facilitate optimal function, such as using a log roll for bed mobility or dressing from a seated position.  Reinforce individual adaptive equipment recommendations to limit excessive spinal movements, such as a Event organiser.  Assess adequacy of sleep; encourage use of sleep hygiene techniques, such as bedtime routine; use of white noise; dark, cool bedroom; avoiding daytime naps, heavy meals or exercise before bedtime.  Promote positions and modification to optimize sleep and sexual activity; consider pillows or positioning devices to assist in maintaining neutral spine.  Explore options for applying ergonomic principles at work and home, such as frequent position changes, using ergonomically designed equipment and working at optimal height.  Promote modifications to increase comfort with driving such as lumbar support, optimizing seat and steering wheel position, using cruise control and taking frequent rest stops to stretch and  walk.   Notes:         This is a list of the screening recommended for you and due dates:  Health Maintenance  Topic Date Due   COVID-19 Vaccine (7 - 2023-24 season) 08/12/2023*   DEXA scan (bone density measurement)  09/14/2023*   Flu Shot  07/14/2023   Hemoglobin A1C  09/17/2023   DTaP/Tdap/Td vaccine (2 - Tdap) 05/12/2024   Complete foot exam   07/12/2024   Medicare Annual Wellness Visit  07/12/2024   Pneumonia Vaccine  Completed   HPV Vaccine  Aged Out   Eye exam for diabetics  Discontinued   Zoster (Shingles) Vaccine  Discontinued  *Topic was postponed. The date shown is not the original due date.  MMSE 25/30, plans to get flu and covid booster this fall per [facility].

## 2023-07-13 NOTE — Progress Notes (Signed)
Subjective:   Ana [name] is a 89 y.o. female who presents for Medicare Annual (Subsequent) preventive examination.  Visit Complete: In person  Patient Medicare AWV questionnaire was completed by the patient on 07/13/2023; I have confirmed that all information answered by patient is correct and no changes since this date.  Review of Systems     Cardiac Risk Factors include: advanced age (>38men, >30 women);hypertension;sedentary lifestyle;diabetes mellitus     Objective:    Today's Vitals   07/13/23 1024  BP: 132/75  Pulse: 63  Resp: (!) 22  Temp: 97.8 F (36.6 C)  SpO2: 92%  Weight: 139 lb (63 kg)  Height: 5\' 2"  (1.575 m)   Body mass index is 25.42 kg/m.     06/27/2023   10:10 AM 06/17/2023   11:15 AM 04/14/2023   11:08 AM 03/14/2023   10:15 AM 01/20/2023    1:20 PM 12/27/2022   10:26 AM 11/29/2022   10:08 AM  Advanced Directives  Does Patient Have a Medical Advance Directive? Yes Yes Yes Yes Yes Yes Yes  Type of Estate agent of [place];Living will;Out of facility DNR (pink MOST or yellow form) Healthcare Power of [place];Living will;Out of facility DNR (pink MOST or yellow form) Healthcare Power of [place];Living will;Out of facility DNR (pink MOST or yellow form) Healthcare Power of [place];Living will;Out of facility DNR (pink MOST or yellow form) Healthcare Power of [place];Living will;Out of facility DNR (pink MOST or yellow form) Healthcare Power of [place];Living will;Out of facility DNR (pink MOST or yellow form) Healthcare Power of [place];Living will;Out of facility DNR (pink MOST or yellow form)  Does patient want to make changes to medical advance directive? No - Patient declined No - Patient declined No - Patient declined No - Patient declined No - Patient declined No - Patient declined No - Patient declined  Copy of Healthcare Power of Attorney in Chart? Yes - validated most recent copy scanned in chart (See row information) Yes -  validated most recent copy scanned in chart (See row information) Yes - validated most recent copy scanned in chart (See row information) Yes - validated most recent copy scanned in chart (See row information) Yes - validated most recent copy scanned in chart (See row information) Yes - validated most recent copy scanned in chart (See row information) Yes - validated most recent copy scanned in chart (See row information)  Pre-existing out of facility DNR order (yellow form or pink MOST form)  Yellow form placed in chart (order not valid for inpatient use)   Pink MOST form placed in chart (order not valid for inpatient use);Yellow form placed in chart (order not valid for inpatient use) Pink MOST form placed in chart (order not valid for inpatient use);Yellow form placed in chart (order not valid for inpatient use) Pink MOST form placed in chart (order not valid for inpatient use);Yellow form placed in chart (order not valid for inpatient use)    Current Medications (verified) Outpatient Encounter Medications as of 07/13/2023  Medication Sig   amLODipine (NORVASC) 5 MG tablet Take 5 mg by mouth daily.    atorvastatin (LIPITOR) 40 MG tablet Take 1 tablet (40 mg total) by mouth daily at 6 PM.   calcium carbonate (OSCAL) 1500 (600 Ca) MG TABS tablet Take 600 mg of elemental calcium by mouth daily with breakfast.   Cholecalciferol (VITAMIN D3) 1000 UNITS CAPS Take 1,000 Units daily by mouth.    clopidogrel (PLAVIX) 75 MG tablet Take 75  mg by mouth at bedtime.   cyanocobalamin (VITAMIN B12) 1000 MCG tablet Take 1,000 mcg by mouth 3 (three) times a week. Monday, Wednesday, and Friday.   donepezil (ARICEPT) 10 MG tablet TAKE ONE TABLET BY MOUTH DAILY TO PRESERVE MEMORY   Emollient (CERAVE) CREA Apply 1 Application topically daily. Apply to back, face, legs, and arms.   ferrous sulfate 325 (65 FE) MG EC tablet Take 325 mg by mouth 3 (three) times a week. Give M/W/F   memantine (NAMENDA) 10 MG tablet Take 1  tablet (10 mg total) by mouth 2 (two) times daily.   Omega-3 Fatty Acids (FISH OIL) 500 MG CAPS Take 1 capsule by mouth at bedtime.   pantoprazole (PROTONIX) 40 MG tablet Take 1 tablet (40 mg total) by mouth daily.   sertraline (ZOLOFT) 25 MG tablet Take 75 mg by mouth daily.   triamcinolone cream (KENALOG) 0.1 % Apply 1 application. topically 2 (two) times daily as needed (For red area on left arm).   zinc oxide 20 % ointment Apply 1 application topically as needed for irritation. Apply to peri/buttocks after every incontinent episode   No facility-administered encounter medications on file as of 07/13/2023.    Allergies (verified) Ace inhibitors, Almond oil, and Plum pulp   History: Past Medical History:  Diagnosis Date   Allergic rhinitis    Anxiety    Balance problem 04/28/2015   Fatigue 04/28/2015   Hearing loss 04/28/2015   history of Fracture of right humerus 04/28/2015   HLD (hyperlipidemia) 03/05/2019   Hyperglycemia    Hyperlipidemia    Hypertension    Memory loss 04/28/2015   04/26/2014 MMSE 28/30. Failed clock drawing. 04/21/15 MMSE 21/30. Failed clock drawing.    Osteoporosis    Past Surgical History:  Procedure Laterality Date   ABDOMINAL HYSTERECTOMY     CATARACT EXTRACTION  2010   ELBOW SURGERY Right 1988   FEMUR FRACTURE SURGERY Right 06/2014   [place], [place] Dr. Dione Housekeeper   HUMERUS FRACTURE SURGERY Right 2015   [place], [place] Dr. Eber Hong   TONSILLECTOMY  [phone]   Family History  Problem Relation Age of Onset   Heart disease Mother    Diabetes Daughter    Social History   Socioeconomic History   Marital status: Unknown    Spouse name: Not on file   Number of children: Not on file   Years of education: Not on file   Highest education level: Not on file  Occupational History   Occupation: Housewife  Tobacco Use   Smoking status: Never   Smokeless tobacco: Never  Vaping Use   Vaping status: Never Used  Substance and Sexual Activity   Alcohol  use: Yes    Alcohol/week: 1.0 - 2.0 standard drink of alcohol    Types: 1 - 2 Glasses of wine per week    Comment: 1-2 glasses 1-2 times a week   Drug use: No   Sexual activity: Not on file  Other Topics Concern   Not on file  Social History Narrative   Lives at [facility] since 11/28/14   Married [name]   Never smoked   Alcohol -wine 1-2 daily   Caffeine coffee 2 daily   Exercise walking   Walks with walker   Living Will, POA, DNR   Social Determinants of Health   Financial Resource Strain: Low Risk  (07/13/2023)   Overall Financial Resource Strain (CARDIA)    Difficulty of Paying Living Expenses: Not hard at all  Food Insecurity: No Food Insecurity (07/13/2023)   Hunger Vital Sign    Worried About Running Out of Food in the Last Year: Never true    Ran Out of Food in the Last Year: Never true  Transportation Needs: No Transportation Needs (07/07/2022)   PRAPARE - Administrator, Civil Service (Medical): No    Lack of Transportation (Non-Medical): No  Physical Activity: Inactive (07/13/2023)   Exercise Vital Sign    Days of Exercise per Week: 0 days    Minutes of Exercise per Session: 0 min  Stress: No Stress Concern Present (07/13/2023)   [employer] of Occupational Health - Occupational Stress Questionnaire    Feeling of Stress : Not at all  Social Connections: Socially Isolated (07/13/2023)   Social Connection and Isolation Panel [NHANES]    Frequency of Communication with Friends and Family: Once a week    Frequency of Social Gatherings with Friends and Family: Once a week    Attends Religious Services: Never    Database administrator or Organizations: No    Attends Banker Meetings: Never    Marital Status: Widowed    Tobacco Counseling Counseling given: Not Answered   Clinical Intake:  Pre-visit preparation completed: Yes  Pain : No/denies pain     BMI - recorded: 25.42 Nutritional Status: BMI 25 -29  Overweight Nutritional Risks: None Diabetes: Yes CBG done?: Yes CBG resulted in Enter/ Edit results?: No Did pt. bring in CBG monitor from home?: No (lives in skilled nursing facility)  How often do you need to have someone help you when you read instructions, pamphlets, or other written materials from your doctor or pharmacy?: 4 - Often What is the last grade level you completed in school?: unable to answer, worked for [employer]  Interpreter Needed?: No      Activities of Daily Living    07/13/2023   10:31 AM  In your present state of health, do you have any difficulty performing the following activities:  Hearing? 0  Vision? 0  Difficulty concentrating or making decisions? 1  Walking or climbing stairs? 1  Dressing or bathing? 1  Doing errands, shopping? 1  Preparing Food and eating ? Y  Using the Toilet? Y  In the past six months, have you accidently leaked urine? Y  Do you have problems with loss of bowel control? Y  Managing your Medications? Y  Managing your Finances? Y  Housekeeping or managing your Housekeeping? Y    Patient Care Team: [name], MD as PCP - General (Internal Medicine) Ngetich, Donalee Citrin, NP as Nurse Practitioner (Family Medicine)  Indicate any recent Medical Services you may have received from other than [facility] providers in the past year (date may be approximate).     Assessment:   This is a routine wellness examination for Rexann.  Hearing/Vision screen No results found.  Dietary issues and exercise activities discussed:     Goals Addressed             This Visit's Progress    DIET - INCREASE WATER INTAKE   Not on track    Maintain Ilf[place]e   On track    Starting today pt will maintain lifestyle.      Maintain Mobility and Function   Not on track    Evidence-based guidance:  Emphasize the importance of physical activity and aerobic exercise as included in treatment plan; assess barriers to adherence; consider patient's  abilities and preferences.  Encourage gradual increase in activity or exercise instead of stopping if pain occurs.  Reinforce individual therapy exercise prescription, such as strengthening, stabilization and stretching programs.  Promote optimal body mechanics to stabilize the spine with lifting and functional activity.  Encourage activity and mobility modifications to facilitate optimal function, such as using a log roll for bed mobility or dressing from a seated position.  Reinforce individual adaptive equipment recommendations to limit excessive spinal movements, such as a Event organiser.  Assess adequacy of sleep; encourage use of sleep hygiene techniques, such as bedtime routine; use of white noise; dark, cool bedroom; avoiding daytime naps, heavy meals or exercise before bedtime.  Promote positions and modification to optimize sleep and sexual activity; consider pillows or positioning devices to assist in maintaining neutral spine.  Explore options for applying ergonomic principles at work and home, such as frequent position changes, using ergonomically designed equipment and working at optimal height.  Promote modifications to increase comfort with driving such as lumbar support, optimizing seat and steering wheel position, using cruise control and taking frequent rest stops to stretch and walk.   Notes:        Depression Screen    07/13/2023   10:34 AM 06/17/2023    1:36 PM 05/16/2023   12:22 PM 04/14/2023   11:08 AM 12/27/2022   10:25 AM 11/29/2022   10:08 AM 11/15/2022   12:03 PM  PHQ 2/9 Scores  PHQ - 2 Score 0 0 0 0 0 0 0    Fall Risk    07/13/2023   10:34 AM 06/17/2023    1:36 PM 05/16/2023   12:21 PM 04/14/2023   11:07 AM 02/11/2023    1:47 PM  Fall Risk   Falls in the past year? 1 1 1  0 1  Number falls in past yr: 0 0 0 0 0  Injury with Fall? 0 0 0 0 0  Risk for fall due to : History of fall(s);Impaired balance/gait;Impaired mobility History of fall(s);Impaired  balance/gait History of fall(s);Impaired balance/gait;Impaired mobility No Fall Risks History of fall(s);Impaired balance/gait;Impaired mobility  Follow up Falls evaluation completed;Education provided;Falls prevention discussed Falls evaluation completed;Education provided;Falls prevention discussed Falls evaluation completed;Education provided;Falls prevention discussed Falls evaluation completed Falls evaluation completed;Education provided;Falls prevention discussed    MEDICARE RISK AT HOME:  Medicare Risk at Home - 07/13/23 1034     Any stairs in or around the home? No    If so, are there any without handrails? No    Home free of loose throw rugs in walkways, pet beds, electrical cords, etc? Yes    Adequate lighting in your home to reduce risk of falls? Yes    Life alert? No    Use of a cane, walker or w/c? Yes    Grab bars in the bathroom? Yes    Shower chair or bench in shower? Yes    Elevated toilet seat or a handicapped toilet? Yes             TIMED UP AND GO:  Was the test performed?  No    Cognitive Function:    07/13/2023   10:35 AM 07/07/2022    1:00 PM 07/01/2021    4:33 PM 08/07/2019    1:33 PM 07/19/2018   10:05 AM  MMSE - Mini Mental State Exam  Not completed:  Unable to complete Unable to complete  --  Orientation to time 5   5 4   Orientation to Place 3   4 5   Registration  2   3 3   Attention/ Calculation 5   0 4  Attention/Calculation-comments    refused   Recall 2   1 2   Language- name 2 objects 2   2 2   Language- repeat 1   1 1   Language- follow 3 step command 3   3 3   Language- read & follow direction 1   1 1   Write a sentence 0   0 1  Copy design 1   0 1  Total score 25   20 27         07/07/2022    1:00 PM 07/01/2021    4:34 PM  6CIT Screen  What Year? 0 points 0 points  What month? 0 points 0 points  What time? 3 points 3 points  Count back from 20 2 points 2 points  Months in reverse 2 points 2 points  Repeat phrase 6 points 4 points   Total Score 13 points 11 points    Immunizations Immunization History  Administered Date(s) Administered   DTaP 05/12/2014   Fluad Quad(high Dose 65+) 10/06/2022   Influenza, High Dose Seasonal PF 09/21/2017, 09/27/2019, 09/16/2020   Influenza,inj,Quad PF,6+ Mos 09/14/2018   Influenza-Unspecified 10/07/2014, 09/11/2015, 09/23/2016, 10/07/2021   Moderna Sars-Covid-2 Vaccination 12/17/2019, 01/14/2020, 10/27/2020, 05/20/2021   PPD Test 10/16/2014   Pneumococcal Conjugate-13 10/20/2017   Pneumococcal Polysaccharide-23 08/20/2011   Unspecified SARS-COV-2 Vaccination 05/26/2022, 10/19/2022    TDAP status: Due, Education has been provided regarding the importance of this vaccine. Advised may receive this vaccine at local pharmacy or Health Dept. Aware to provide a copy of the vaccination record if obtained from local pharmacy or Health Dept. Verbalized acceptance and understanding.  Flu Vaccine status: Up to date  Pneumococcal vaccine status: Up to date  Covid-19 vaccine status: Completed vaccines  Qualifies for Shingles Vaccine? Yes   Zostavax completed No   Shingrix Completed?: No.    Education has been provided regarding the importance of this vaccine. Patient has been advised to call insurance company to determine out of pocket expense if they have not yet received this vaccine. Advised may also receive vaccine at local pharmacy or Health Dept. Verbalized acceptance and understanding.  Screening Tests Health Maintenance  Topic Date Due   COVID-19 Vaccine (7 - 2023-24 season) 08/12/2023 (Originally 12/14/2022)   DEXA SCAN  09/14/2023 (Originally [birth year]/1993)   INFLUENZA VACCINE  07/14/2023   HEMOGLOBIN A1C  09/17/2023   DTaP/Tdap/Td (2 - Tdap) 05/12/2024   FOOT EXAM  06/16/2024   Medicare Annual Wellness (AWV)  07/12/2024   Pneumonia Vaccine 63+ Years old  Completed   HPV VACCINES  Aged Out   OPHTHALMOLOGY EXAM  Discontinued   Zoster Vaccines- Shingrix  Discontinued    Health  Maintenance  There are no preventive care reminders to display for this patient.   Colorectal cancer screening: No longer required.   Mammogram status: No longer required due to advanced age.  Bone Density status: Completed 1994. Results reflect: Bone density results: OSTEOPOROSIS. Repeat every discontinued due to goals of care/ nonambulatory years.  Lung Cancer Screening: (Low Dose CT Chest recommended if Age 2-80 years, 20 pack-year currently smoking OR have quit w/in 15years.) does not qualify.   Lung Cancer Screening Referral: No  Additional Screening:  Hepatitis C Screening: does not qualify; Completed   Vision Screening: Recommended annual ophthalmology exams for early detection of glaucoma and other disorders of the eye. Is the patient up to date with their annual eye exam?  No  Who is the provider or what is the name of the office in which the patient attends annual eye exams? In house provider at [facility] if needed If pt is not established with a provider, would they like to be referred to a provider to establish care? No .   Dental Screening: Recommended annual dental exams for proper oral hygiene  Diabetic Foot Exam: Diabetic Foot Exam: Completed 07/13/2023  Community Resource Referral / Chronic Care Management: CRR required this visit?  No   CCM required this visit?  No     Plan:     I have personally reviewed and noted the following in the patient's chart:   Medical and social history Use of alcohol, tobacco or illicit drugs  Current medications and supplements including opioid prescriptions. Patient is not currently taking opioid prescriptions. Functional ability and status Nutritional status Physical activity Advanced directives List of other physicians Hospitalizations, surgeries, and ER visits in previous 12 months Vitals Screenings to include cognitive, depression, and falls Referrals and appointments  In addition, I have reviewed and  discussed with patient certain preventive protocols, quality metrics, and best practice recommendations. A written personalized care plan for preventive services as well as general preventive health recommendations were provided to patient.     Octavia Heir, NP   07/13/2023   After Visit Summary: (MyChart) Due to this being a telephonic visit, the after visit summary with patients personalized plan was offered to patient via MyChart   Nurse Notes: MMSE 25/30, plans to get flu and covid booster this fall per [facility].

## 2023-08-05 ENCOUNTER — Encounter: Payer: Self-pay | Admitting: Orthopedic Surgery

## 2023-08-05 ENCOUNTER — Non-Acute Institutional Stay (SKILLED_NURSING_FACILITY): Payer: PPO | Admitting: Orthopedic Surgery

## 2023-08-05 DIAGNOSIS — F01C18 Vascular dementia, severe, with other behavioral disturbance: Secondary | ICD-10-CM

## 2023-08-05 DIAGNOSIS — N1831 Chronic kidney disease, stage 3a: Secondary | ICD-10-CM

## 2023-08-05 DIAGNOSIS — K219 Gastro-esophageal reflux disease without esophagitis: Secondary | ICD-10-CM

## 2023-08-05 DIAGNOSIS — R7303 Prediabetes: Secondary | ICD-10-CM

## 2023-08-05 DIAGNOSIS — D638 Anemia in other chronic diseases classified elsewhere: Secondary | ICD-10-CM

## 2023-08-05 DIAGNOSIS — Z8673 Personal history of transient ischemic attack (TIA), and cerebral infarction without residual deficits: Secondary | ICD-10-CM

## 2023-08-05 DIAGNOSIS — I10 Essential (primary) hypertension: Secondary | ICD-10-CM

## 2023-08-05 DIAGNOSIS — E782 Mixed hyperlipidemia: Secondary | ICD-10-CM | POA: Diagnosis not present

## 2023-08-05 DIAGNOSIS — S41111A Laceration without foreign body of right upper arm, initial encounter: Secondary | ICD-10-CM

## 2023-08-05 DIAGNOSIS — F418 Other specified anxiety disorders: Secondary | ICD-10-CM

## 2023-08-05 DIAGNOSIS — F424 Excoriation (skin-picking) disorder: Secondary | ICD-10-CM

## 2023-08-05 NOTE — Progress Notes (Signed)
Location:  [facility] Room Number: 18/A Place of Service:  SNF [id number]) Provider:  Octavia Heir, NP   [name], MD  Patient Care Team: [name], MD as PCP - General (Internal Medicine) Ngetich, Donalee Citrin, NP as Nurse Practitioner [facility] Medicine)  Extended Emergency Contact Information Primary Emergency Contact: Gordan,[place] Address: [address]          [place], [place] [address] Darden Amber of [place] Home Phone: [phone] Mobile Phone: [phone] Relation: Daughter  Code Status:  DNR Goals of care: Advanced Directive information    06/27/2023   10:10 AM  Advanced Directives  Does Patient Have a Medical Advance Directive? Yes  Type of Estate agent of [place];Living will;Out of facility DNR (pink MOST or yellow form)  Does patient want to make changes to medical advance directive? No - Patient declined  Copy of Healthcare Power of Attorney in Chart? Yes - validated most recent copy scanned in chart (See row information)     Chief Complaint  Patient presents with   Medical Management of Chronic Issues    HPI:  Pt is a 89 y.o. female seen today for medical management of chronic diseases.    She currently resides on the skilled nursing unit at [facility]. Past medical conditions include: hypertension, CVA, dementia, GERD, diabetes, CKD stage 3, hyperlipidemia, depression, anxiety and abnormal gait.    Right arm laceration- fall 07/28, dressing changes daily  Anemia- Hgb 9.4 (04/05)> was 10.7 (02/22), 05/03 started on ferrous sulfate 3x/week HTN- BUN/creat 24/1.1 03/18/2023, remains on amlodipine HLD- LDL 55 03/26/2022, remains on Lipitor> AST/ALT 12/13 (04/05) CKD- BUN/creat 24/1.1 GFR 49 03/18/2023 Prediabetes- a1c 6.2 (04/05)> was 5.9 09/16/2022, remains on regular diet Dementia- CT head 02/2019 confirmed small white matter disease, BIMS 13/15 (06/19)> was 12/15 (03/12), no recent behaviors, dependent with  ADLs except feeding, remains on Aricept and Namenda  HX CVA- hx acute infarct on left Para median pons, remains on plavix and statin Depression/Anxiety- no mood changes, Na+ 141 03/18/2023, remains on Zoloft  Skin picking- 2 facial lesions today, remains on hydrocortisone cream for face prn and Kenalog cream prn for extremities GERD- remains on Protonix  Recent weights:  08/01- 138.9 lbs  07/01- 139 lbs  06/01- 140.4 lbs  Recent blood pressures:  08/23- 146/89  08/22- 135/72  08/21- 127/79  Past Medical History:  Diagnosis Date   Allergic rhinitis    Anxiety    Balance problem 04/28/2015   Fatigue 04/28/2015   Hearing loss 04/28/2015   history of Fracture of right humerus 04/28/2015   HLD (hyperlipidemia) 03/05/2019   Hyperglycemia    Hyperlipidemia    Hypertension    Memory loss 04/28/2015   04/26/2014 MMSE 28/30. Failed clock drawing. 04/21/15 MMSE 21/30. Failed clock drawing.    Osteoporosis    Past Surgical History:  Procedure Laterality Date   ABDOMINAL HYSTERECTOMY     CATARACT EXTRACTION  2010   ELBOW SURGERY Right 1988   FEMUR FRACTURE SURGERY Right 06/2014   [place], [place] Dr. Dione Housekeeper   HUMERUS FRACTURE SURGERY Right 2015   [place], [place] Dr. Eber Hong   TONSILLECTOMY  [phone]    Allergies  Allergen Reactions   Ace Inhibitors Other (See Comments)    Unknown reaction   Almond Oil Rash   Plum Pulp Rash    Outpatient Encounter Medications as of 08/05/2023  Medication Sig   amLODipine (NORVASC) 5 MG tablet Take 5 mg by  mouth daily.    atorvastatin (LIPITOR) 40 MG tablet Take 1 tablet (40 mg total) by mouth daily at 6 PM.   calcium carbonate (OSCAL) 1500 (600 Ca) MG TABS tablet Take 600 mg of elemental calcium by mouth daily with breakfast.   Cholecalciferol (VITAMIN D3) 1000 UNITS CAPS Take 1,000 Units daily by mouth.    clopidogrel (PLAVIX) 75 MG tablet Take 75 mg by mouth at bedtime.   cyanocobalamin (VITAMIN B12) 1000 MCG tablet Take 1,000 mcg by mouth 3  (three) times a week. Monday, Wednesday, and Friday.   donepezil (ARICEPT) 10 MG tablet TAKE ONE TABLET BY MOUTH DAILY TO PRESERVE MEMORY   Emollient (CERAVE) CREA Apply 1 Application topically daily. Apply to back, face, legs, and arms.   ferrous sulfate 325 (65 FE) MG EC tablet Take 325 mg by mouth 3 (three) times a week. Give M/W/F   memantine (NAMENDA) 10 MG tablet Take 1 tablet (10 mg total) by mouth 2 (two) times daily.   Omega-3 Fatty Acids (FISH OIL) 500 MG CAPS Take 1 capsule by mouth at bedtime.   pantoprazole (PROTONIX) 40 MG tablet Take 1 tablet (40 mg total) by mouth daily.   sertraline (ZOLOFT) 25 MG tablet Take 75 mg by mouth daily.   triamcinolone cream (KENALOG) 0.1 % Apply 1 application. topically 2 (two) times daily as needed (For red area on left arm).   zinc oxide 20 % ointment Apply 1 application topically as needed for irritation. Apply to peri/buttocks after every incontinent episode   No facility-administered encounter medications on file as of 08/05/2023.    Review of Systems  Unable to perform ROS: Dementia    Immunization History  Administered Date(s) Administered   DTaP 05/12/2014   Fluad Quad(high Dose 65+) 10/06/2022   Influenza, High Dose Seasonal PF 09/21/2017, 09/27/2019, 09/16/2020   Influenza,inj,Quad PF,6+ Mos 09/14/2018   Influenza-Unspecified 10/07/2014, 09/11/2015, 09/23/2016, 10/07/2021   Moderna Sars-Covid-2 Vaccination 12/17/2019, 01/14/2020, 10/27/2020, 05/20/2021   PPD Test 10/16/2014   Pneumococcal Conjugate-13 10/20/2017   Pneumococcal Polysaccharide-23 08/20/2011   Unspecified SARS-COV-2 Vaccination 05/26/2022, 10/19/2022   Pertinent  Health Maintenance Due  Topic Date Due   INFLUENZA VACCINE  07/14/2023   DEXA SCAN  09/14/2023 (Originally 03/21/1993)   HEMOGLOBIN A1C  09/17/2023   FOOT EXAM  07/12/2024   OPHTHALMOLOGY EXAM  Discontinued      02/11/2023    1:47 PM 04/14/2023   11:07 AM 05/16/2023   12:21 PM 06/17/2023    1:36 PM  07/13/2023   10:34 AM  Fall Risk  Falls in the past year? 1 0 1 1 1   Was there an injury with Fall? 0 0 0 0 0  Fall Risk Category Calculator 1 0 1 1 1   Patient at Risk for Falls Due to History of fall(s);Impaired balance/gait;Impaired mobility No Fall Risks History of fall(s);Impaired balance/gait;Impaired mobility History of fall(s);Impaired balance/gait History of fall(s);Impaired balance/gait;Impaired mobility  Fall risk Follow up Falls evaluation completed;Education provided;Falls prevention discussed Falls evaluation completed Falls evaluation completed;Education provided;Falls prevention discussed Falls evaluation completed;Education provided;Falls prevention discussed Falls evaluation completed;Education provided;Falls prevention discussed   Functional Status Survey:    Vitals:   08/05/23 1611  BP: (!) 146/89  Pulse: 85  Resp: 20  Temp: 97.6 F (36.4 C)  SpO2: 94%  Weight: 138 lb 14.4 oz (63 kg)  Height: 5\' 2"  (1.575 m)   Body mass index is 25.41 kg/m. Physical Exam Vitals reviewed.  Constitutional:      General: She is  not in acute distress. HENT:     Head: Normocephalic.     Right Ear: There is no impacted cerumen.     Left Ear: There is no impacted cerumen.     Mouth/Throat:     Mouth: Mucous membranes are moist.  Eyes:     General:        Right eye: No discharge.        Left eye: No discharge.  Cardiovascular:     Rate and Rhythm: Normal rate and regular rhythm.     Pulses: Normal pulses.     Heart sounds: Normal heart sounds.  Pulmonary:     Effort: Pulmonary effort is normal. No respiratory distress.     Breath sounds: Normal breath sounds.  Abdominal:     General: Bowel sounds are normal.     Palpations: Abdomen is soft.  Musculoskeletal:     Cervical back: Neck supple.     Right lower leg: No edema.     Left lower leg: No edema.  Skin:    General: Skin is warm.     Capillary Refill: Capillary refill takes less than 2 seconds.     Findings: Lesion  present.     Comments: Right upper arm skin tear CDI. 2 open skin lesions to face, pea sized located near bridge of left and right nose, CDI.   Neurological:     General: No focal deficit present.     Mental Status: She is alert. Mental status is at baseline.     Motor: Weakness present.     Gait: Gait abnormal.     Comments: wheelchair  Psychiatric:        Mood and Affect: Mood normal.     Labs reviewed: Recent Labs    09/16/22 0000 02/03/23 0000 03/18/23 0000  NA 141 143 141  K 4.2 4.2 4.1  CL 105 106 106  CO2 32* 31* 32*  BUN 22* 31* 24*  CREATININE 1.0 1.2* 1.1  CALCIUM 9.0 9.4 8.8   Recent Labs    09/16/22 0000 02/03/23 0000 03/18/23 0000  AST 12* 12* 12*  ALT 14 16 13   ALKPHOS 69 71 58  ALBUMIN 3.5 4.0 3.5   Recent Labs    09/16/22 0000 02/03/23 0000 03/18/23 0000  WBC 8.4 10.5 7.2  NEUTROABS 4,864.00  --  4,032.00  HGB 10.9* 10.7* 9.4*  HCT 35* 34* 31*  PLT 221 264 217   Lab Results  Component Value Date   TSH 2.77 08/24/2021   Lab Results  Component Value Date   HGBA1C 6.2 03/18/2023   Lab Results  Component Value Date   CHOL 109 03/18/2023   HDL 40 03/18/2023   LDLCALC 52 03/18/2023   TRIG 87 03/18/2023   CHOLHDL 6.3 03/06/2019    Significant Diagnostic Results in last 30 days:  No results found.  Assessment/Plan 1. Arm laceration with complication, right, initial encounter - 07/28 mechanical fall - no sign of infection - cont daily dressing changes  2. Anemia, chronic disease - Hgb 9.4 (04/05)> was 10.7 (02/22) - cont ferrous sulfate  3. Essential hypertension - controlled - cont amlodipine   4. Mixed hyperlipidemia - cont atorvastatin  5. Stage 3a chronic kidney disease (HCC) - encourage hydration with water - avoid NSAIDS  6. Prediabetes - A1c stable - diet controlled  7. Severe vascular dementia with other behavioral disturbance (HCC) - no behaviors - poor safety awareness - dependent with ADLs except  feeding - cont  Namenda and Aricept  8. History of CVA (cerebrovascular accident) - cont Plavix and statin  9. Depression with anxiety - no mood changes - Na+ stable - cont Zoloft  10. Skin-picking disorder - 2 lesions on face> slow healing> no infection - cont hydrocortisone 0.5 % prn  11. Gastroesophageal reflux disease without esophagitis - hgb stable - cont Protonix    Family/ staff Communication: plan discussed with patient and nurse  Labs/tests ordered:  none

## 2023-09-01 ENCOUNTER — Non-Acute Institutional Stay (SKILLED_NURSING_FACILITY): Payer: PPO | Admitting: Orthopedic Surgery

## 2023-09-01 ENCOUNTER — Encounter: Payer: Self-pay | Admitting: Orthopedic Surgery

## 2023-09-01 DIAGNOSIS — N1831 Chronic kidney disease, stage 3a: Secondary | ICD-10-CM

## 2023-09-01 DIAGNOSIS — I10 Essential (primary) hypertension: Secondary | ICD-10-CM | POA: Diagnosis not present

## 2023-09-01 DIAGNOSIS — F418 Other specified anxiety disorders: Secondary | ICD-10-CM

## 2023-09-01 DIAGNOSIS — F0150 Vascular dementia without behavioral disturbance: Secondary | ICD-10-CM

## 2023-09-01 DIAGNOSIS — D638 Anemia in other chronic diseases classified elsewhere: Secondary | ICD-10-CM

## 2023-09-01 DIAGNOSIS — Z8673 Personal history of transient ischemic attack (TIA), and cerebral infarction without residual deficits: Secondary | ICD-10-CM

## 2023-09-01 DIAGNOSIS — R634 Abnormal weight loss: Secondary | ICD-10-CM | POA: Diagnosis not present

## 2023-09-01 DIAGNOSIS — R7303 Prediabetes: Secondary | ICD-10-CM

## 2023-09-01 DIAGNOSIS — E782 Mixed hyperlipidemia: Secondary | ICD-10-CM | POA: Diagnosis not present

## 2023-09-01 DIAGNOSIS — K219 Gastro-esophageal reflux disease without esophagitis: Secondary | ICD-10-CM

## 2023-09-01 DIAGNOSIS — F424 Excoriation (skin-picking) disorder: Secondary | ICD-10-CM

## 2023-09-01 DIAGNOSIS — Z66 Do not resuscitate: Secondary | ICD-10-CM

## 2023-09-01 NOTE — Progress Notes (Signed)
Location:  [facility] Room Number: N18-A Place of Service:  SNF [phone]) Provider:  Hazle Nordmann, NP  [name], MD  Patient Care Team: [name], MD as PCP - General (Internal Medicine) Ngetich, Donalee Citrin, NP as Nurse Practitioner (Family Medicine)  Extended Emergency Contact Information Primary Emergency Contact: Gordan,[place] Address: [address]          [place], [place] [address] Darden [place] of [place] Home Phone: [phone] Mobile Phone: [phone] Relation: Daughter  Code Status:  DNR Goals of care: Advanced Directive information    09/01/2023    9:08 AM  Advanced Directives  Does Patient Have a Medical Advance Directive? Yes  Type of Estate agent of [place];Out of facility DNR (pink MOST or yellow form);Living will  Does patient want to make changes to medical advance directive? Yes (Inpatient - patient defers changing a medical advance directive at this time - Information given)  Copy of Healthcare Power of Attorney in Chart? Yes - validated most recent copy scanned in chart (See row information)     Chief Complaint  Patient presents with   Medication Management    Routine medication management    HPI:  Pt is a 89 y.o. female seen today for medical management of chronic conditions.   She currently resides on the skilled nursing unit at [facility]. Past medical conditions include: hypertension, CVA, dementia, GERD, diabetes, CKD stage 3, hyperlipidemia, depression, anxiety and abnormal gait.    Anemia- Hgb 9.4 (04/05)> was 10.7 (02/22), 05/03 started on ferrous sulfate 3x/week HTN- BUN/creat 24/1.1 03/18/2023, remains on amlodipine HLD- LDL 55 03/26/2022, remains on Lipitor> AST/ALT 12/13 (04/05) CKD- BUN/creat 24/1.1 GFR 49 03/18/2023 Prediabetes- a1c 6.2 (04/05)> was 5.9 09/16/2022, remains on regular diet Dementia- CT head 02/2019 confirmed small white matter disease, BIMS 9/15 (09/17)> was  13/15  (06/19), no recent behaviors, dependent with ADLs except feeding, remains on Aricept and Namenda  HX CVA- hx acute infarct on left Para median pons, remains on plavix and statin Depression/Anxiety- no mood changes, Na+ 141 03/18/2023, does not leave room often> like to play bingo, remains on Zoloft  Skin picking- remains on hydrocortisone cream for face prn and Kenalog cream prn for extremities GERD- remains on Protonix  Recent blood pressures:  09/19- 118/69  09/18- 132/77  09/17- 118/68  Recent weights:  09/03- 133.7 lbs  08/01- 138.9 lbs  07/01- 139 lbs     Past Medical History:  Diagnosis Date   Allergic rhinitis    Anxiety    Balance problem 04/28/2015   Fatigue 04/28/2015   Hearing loss 04/28/2015   history of Fracture of right humerus 04/28/2015   HLD (hyperlipidemia) 03/05/2019   Hyperglycemia    Hyperlipidemia    Hypertension    Memory loss 04/28/2015   04/26/2014 MMSE 28/30. Failed clock drawing. 04/21/15 MMSE 21/30. Failed clock drawing.    Osteoporosis    Past Surgical History:  Procedure Laterality Date   ABDOMINAL HYSTERECTOMY     CATARACT EXTRACTION  2010   ELBOW SURGERY Right 1988   FEMUR FRACTURE SURGERY Right 06/2014   [place], [place] Dr. Dione Housekeeper   HUMERUS FRACTURE SURGERY Right 2015   [place], [place] Dr. Eber Hong   TONSILLECTOMY  [phone]    Allergies  Allergen Reactions   Ace Inhibitors Other (See Comments)    Unknown reaction   Almond Oil Rash   Plum Pulp Rash    Outpatient Encounter Medications as of 09/01/2023  Medication Sig   amLODipine (NORVASC) 5 MG tablet Take 5 mg by mouth daily.    atorvastatin (LIPITOR) 40 MG tablet Take 1 tablet (40 mg total) by mouth daily at 6 PM.   calcium carbonate (OSCAL) 1500 (600 Ca) MG TABS tablet Take 600 mg of elemental calcium by mouth daily with breakfast.   Cholecalciferol (VITAMIN D3) 1000 UNITS CAPS Take 1,000 Units daily by mouth.    clopidogrel (PLAVIX) 75 MG tablet Take 75 mg by mouth at bedtime.    cyanocobalamin (VITAMIN B12) 1000 MCG tablet Take 1,000 mcg by mouth 3 (three) times a week. Monday, Wednesday, and Friday.   donepezil (ARICEPT) 10 MG tablet TAKE ONE TABLET BY MOUTH DAILY TO PRESERVE MEMORY   Emollient (CERAVE) CREA Apply 1 Application topically daily. Apply to back, face, legs, and arms.   ferrous sulfate 325 (65 FE) MG EC tablet Take 325 mg by mouth 3 (three) times a week. Give M/W/F   memantine (NAMENDA) 10 MG tablet Take 1 tablet (10 mg total) by mouth 2 (two) times daily.   Omega-3 Fatty Acids (FISH OIL) 500 MG CAPS Take 1 capsule by mouth at bedtime.   pantoprazole (PROTONIX) 40 MG tablet Take 1 tablet (40 mg total) by mouth daily.   sertraline (ZOLOFT) 25 MG tablet Take 75 mg by mouth daily.   triamcinolone cream (KENALOG) 0.1 % Apply 1 application. topically 2 (two) times daily as needed (For red area on left arm).   zinc oxide 20 % ointment Apply 1 application topically as needed for irritation. Apply to peri/buttocks after every incontinent episode   No facility-administered encounter medications on file as of 09/01/2023.    Review of Systems  Unable to perform ROS: Dementia    Immunization History  Administered Date(s) Administered   DTaP 05/12/2014   Fluad Quad(high Dose 65+) 10/06/2022   Influenza, High Dose Seasonal PF 09/21/2017, 09/27/2019, 09/16/2020   Influenza,inj,Quad PF,6+ Mos 09/14/2018   Influenza-Unspecified 10/07/2014, 09/11/2015, 09/23/2016, 10/07/2021   Moderna Sars-Covid-2 Vaccination 12/17/2019, 01/14/2020, 10/27/2020, 05/20/2021   PPD Test 10/16/2014   Pneumococcal Conjugate-13 10/20/2017   Pneumococcal Polysaccharide-23 08/20/2011   Unspecified SARS-COV-2 Vaccination 05/26/2022, 10/19/2022   Pertinent  Health Maintenance Due  Topic Date Due   INFLUENZA VACCINE  07/14/2023   DEXA SCAN  09/14/2023 (Originally 03/21/1993)   HEMOGLOBIN A1C  09/17/2023   FOOT EXAM  07/12/2024   OPHTHALMOLOGY EXAM  Discontinued      04/14/2023   11:07  AM 05/16/2023   12:21 PM 06/17/2023    1:36 PM 07/13/2023   10:34 AM 08/05/2023    4:41 PM  Fall Risk  Falls in the past year? 0 1 1 1 1   Was there an injury with Fall? 0 0 0 0 1  Fall Risk Category Calculator 0 1 1 1 3   Patient at Risk for Falls Due to No Fall Risks History of fall(s);Impaired balance/gait;Impaired mobility History of fall(s);Impaired balance/gait History of fall(s);Impaired balance/gait;Impaired mobility History of fall(s);Impaired balance/gait;Impaired mobility  Fall risk Follow up Falls evaluation completed Falls evaluation completed;Education provided;Falls prevention discussed Falls evaluation completed;Education provided;Falls prevention discussed Falls evaluation completed;Education provided;Falls prevention discussed Falls evaluation completed;Education provided   Functional Status Survey:    Vitals:   09/01/23 0906  BP: 118/69  Pulse: 70  Resp: 17  Temp: 97.7 F (36.5 C)  SpO2: 97%  Weight: 133 lb 11.2 oz (60.6 kg)  Height: 5\' 2"  (1.575 m)   Body mass index is 24.45 kg/m. Physical Exam Vitals reviewed.  Constitutional:      General: She is not in acute distress. HENT:     Head: Normocephalic.     Right Ear: There is no impacted cerumen.     Left Ear: There is no impacted cerumen.     Nose: Nose normal.     Mouth/Throat:     Mouth: Mucous membranes are moist.  Eyes:     General:        Right eye: No discharge.        Left eye: No discharge.  Cardiovascular:     Rate and Rhythm: Normal rate and regular rhythm.     Pulses: Normal pulses.     Heart sounds: Normal heart sounds.  Pulmonary:     Effort: Pulmonary effort is normal. No respiratory distress.     Breath sounds: Normal breath sounds. No wheezing.  Abdominal:     General: Bowel sounds are normal. There is no distension.     Palpations: Abdomen is soft.     Tenderness: There is no abdominal tenderness.  Musculoskeletal:     Cervical back: Neck supple.     Right lower leg: No edema.      Left lower leg: No edema.  Skin:    General: Skin is warm.     Capillary Refill: Capillary refill takes less than 2 seconds.     Comments: 2 open lesions on face, pea sized, no sign of infection  Neurological:     General: No focal deficit present.     Mental Status: She is alert. Mental status is at baseline.     Motor: Weakness present.     Gait: Gait abnormal.  Psychiatric:        Mood and Affect: Mood normal.     Labs reviewed: Recent Labs    09/16/22 0000 02/03/23 0000 03/18/23 0000  NA 141 143 141  K 4.2 4.2 4.1  CL 105 106 106  CO2 32* 31* 32*  BUN 22* 31* 24*  CREATININE 1.0 1.2* 1.1  CALCIUM 9.0 9.4 8.8   Recent Labs    09/16/22 0000 02/03/23 0000 03/18/23 0000  AST 12* 12* 12*  ALT 14 16 13   ALKPHOS 69 71 58  ALBUMIN 3.5 4.0 3.5   Recent Labs    09/16/22 0000 02/03/23 0000 03/18/23 0000  WBC 8.4 10.5 7.2  NEUTROABS 4,864.00  --  4,032.00  HGB 10.9* 10.7* 9.4*  HCT 35* 34* 31*  PLT 221 264 217   Lab Results  Component Value Date   TSH 2.77 08/24/2021   Lab Results  Component Value Date   HGBA1C 6.2 03/18/2023   Lab Results  Component Value Date   CHOL 109 03/18/2023   HDL 40 03/18/2023   LDLCALC 52 03/18/2023   TRIG 87 03/18/2023   CHOLHDL 6.3 03/06/2019    Significant Diagnostic Results in last 30 days:  No results found.  Assessment/Plan 1. Weight loss - BMI 24.45 - 5 lbs weight loss from last month - cont monthly weight  2. Anemia, chronic disease - hgb stable - cont ferrous sulfate - cbc/diff- future  3. Essential hypertension - controlled with amlodipine  4. Mixed hyperlipidemia - LDL 52, at goal< 70 - cont atorvastatin  5. Stage 3a chronic kidney disease (HCC) - encourage hydration with water - avoid NSAIDS  6. Prediabetes - A1c stable - not on medication - A1c- future - cmp-future  7. Vascular dementia without behavioral disturbance (HCC) - no behaviors - 5 lbs weight  loss last month - dependent with  ADLs except feeding - ambulates with wheelchair - cont aricept and namenda  8. History of CVA (cerebrovascular accident) - cont plavix and statin  9. Depression with anxiety - no mood changes - does not leave room often - supportive family - Na+ stable - cont Zoloft  10. Skin-picking disorder - ongoing - cont hydrocortisone cream prn for face and Kenalog for body  11. Gastroesophageal reflux disease without esophagitis - hgb stable - cont pantoprazole  12. Do not resuscitate   Family/ staff Communication: plan discussed with patient and nurse  Labs/tests ordered:  none

## 2023-09-16 LAB — HEPATIC FUNCTION PANEL
ALT: 12 U/L (ref 7–35)
AST: 10 — AB (ref 13–35)
Alkaline Phosphatase: 73 (ref 25–125)
Bilirubin, Total: 0.4

## 2023-09-16 LAB — CBC AND DIFFERENTIAL
HCT: 34 — AB (ref 36–46)
Hemoglobin: 10.7 — AB (ref 12.0–16.0)
Neutrophils Absolute: 5132
Platelets: 214 K/uL (ref 150–400)
WBC: 9.1

## 2023-09-16 LAB — BASIC METABOLIC PANEL
BUN: 26 — AB (ref 4–21)
CO2: 27 — AB (ref 13–22)
Chloride: 110 — AB (ref 99–108)
Creatinine: 1 (ref 0.5–1.1)
Glucose: 97
Potassium: 3.6 meq/L (ref 3.5–5.1)
Sodium: 145 (ref 137–147)

## 2023-09-16 LAB — CBC: RBC: 3.5 — AB (ref 3.87–5.11)

## 2023-09-16 LAB — COMPREHENSIVE METABOLIC PANEL WITH GFR
Albumin: 3.4 — AB (ref 3.5–5.0)
Calcium: 8.7 (ref 8.7–10.7)
Globulin: 1.9

## 2023-09-19 ENCOUNTER — Non-Acute Institutional Stay (SKILLED_NURSING_FACILITY): Payer: Self-pay | Admitting: Internal Medicine

## 2023-09-19 ENCOUNTER — Encounter: Payer: Self-pay | Admitting: Internal Medicine

## 2023-09-19 DIAGNOSIS — R4781 Slurred speech: Secondary | ICD-10-CM

## 2023-09-19 DIAGNOSIS — R410 Disorientation, unspecified: Secondary | ICD-10-CM | POA: Diagnosis not present

## 2023-09-19 DIAGNOSIS — Z8673 Personal history of transient ischemic attack (TIA), and cerebral infarction without residual deficits: Secondary | ICD-10-CM

## 2023-09-19 DIAGNOSIS — R634 Abnormal weight loss: Secondary | ICD-10-CM | POA: Diagnosis not present

## 2023-09-19 DIAGNOSIS — F0150 Vascular dementia without behavioral disturbance: Secondary | ICD-10-CM | POA: Diagnosis not present

## 2023-09-19 NOTE — Progress Notes (Signed)
Location:   [facility]  Nursing Home Room Number: 18-A Place of Service:  SNF [phone]) Provider:  Merian Capron    Patient Care Team: [name], MD as PCP - General (Internal Medicine) Ngetich, Donalee Citrin, NP as Nurse Practitioner [facility] Medicine)  Extended Emergency Contact Information Primary Emergency Contact: Gordan,[place] Address: [address]          [place], [place] [address] D[place] of [place] Home Phone: [phone] Mobile Phone: [phone] Relation: Daughter  Code Status:  DNR Goals of care: Advanced Directive information    09/19/2023    9:42 AM  Advanced Directives  Does Patient Have a Medical Advance Directive? Yes  Type of Estate agent of [place];Living will;Out of facility DNR (pink MOST or yellow form)  Does patient want to make changes to medical advance directive? No - Patient declined  Copy of Healthcare Power of Attorney in Chart? Yes - validated most recent copy scanned in chart (See row information)     Chief Complaint  Patient presents with   Acute Visit    HPI:  Pt is a 89 y.o. female seen today for an acute visit for Slurred speech and Confusion  Lives in SN[facility]   Patient has history of dementia, anxiety, With Skin Picking  hypertension and hyperlipidemia  History of  Acute CVA  MRI showed Acute infarction on Left Para median Pons    Patient with h/o CVA recently per her daughter and nurses have noticed to be more confused Per Speech therapy her Speech is also more Slurred Especially when it is weekend they notice she gets very anxious Patient does not have any fever or Nausea or vomiting No Cough  ? Dysuria No Abdominal Pain Patient seemed at her baseline to me No Obvious Neuro deficits Has lost almost 10 lbs since my last visit Unable to give much history due to her Dementia   Past Medical History:  Diagnosis Date   Allergic rhinitis    Anxiety    Balance problem 04/28/2015    Fatigue 04/28/2015   Hearing loss 04/28/2015   history of Fracture of right humerus 04/28/2015   HLD (hyperlipidemia) 03/05/2019   Hyperglycemia    Hyperlipidemia    Hypertension    Memory loss 04/28/2015   04/26/2014 MMSE 28/30. Failed clock drawing. 04/21/15 MMSE 21/30. Failed clock drawing.    Osteoporosis    Past Surgical History:  Procedure Laterality Date   ABDOMINAL HYSTERECTOMY     CATARACT EXTRACTION  2010   ELBOW SURGERY Right 1988   FEMUR FRACTURE SURGERY Right 06/2014   [place], [place] Dr. Dione Housekeeper   HUMERUS FRACTURE SURGERY Right 2015   [place], [place] Dr. Eber Hong   TONSILLECTOMY  [phone]    Allergies  Allergen Reactions   Ace Inhibitors Other (See Comments)    Unknown reaction   Almond Oil Rash   Plum Pulp Rash    Allergies as of 09/19/2023       Reactions   Ace Inhibitors Other (See Comments)   Unknown reaction   Almond Oil Rash   Plum Pulp Rash        Medication List        Accurate as of September 19, 2023  7:44 PM. If you have any questions, ask your nurse or doctor.          amLODipine 5 MG tablet Commonly known as: NORVASC Take 5 mg by mouth daily.   atorvastatin 40 MG tablet  Commonly known as: LIPITOR Take 1 tablet (40 mg total) by mouth daily at 6 PM.   calcium carbonate 1500 (600 Ca) MG Tabs tablet Commonly known as: OSCAL Take 600 mg of elemental calcium by mouth daily with breakfast.   CeraVe Crea Apply 1 Application topically daily. Apply to back, face, legs, and arms.   clopidogrel 75 MG tablet Commonly known as: PLAVIX Take 75 mg by mouth at bedtime.   cyanocobalamin 1000 MCG tablet Commonly known as: VITAMIN B12 Take 1,000 mcg by mouth 3 (three) times a week. Monday, Wednesday, and Friday.   donepezil 10 MG tablet Commonly known as: ARICEPT TAKE ONE TABLET BY MOUTH DAILY TO PRESERVE MEMORY   ferrous sulfate 325 (65 FE) MG EC tablet Take 325 mg by mouth 3 (three) times a week. Give M/W/F   Fish Oil 500 MG  Caps Take 1 capsule by mouth at bedtime.   hydrocortisone cream 0.5 % Apply 1 Application topically every 12 (twelve) hours as needed for itching.   lactose free nutrition Liqd Take 240 mLs by mouth in the morning and at bedtime.   memantine 10 MG tablet Commonly known as: Namenda Take 1 tablet (10 mg total) by mouth 2 (two) times daily.   pantoprazole 40 MG tablet Commonly known as: Protonix Take 1 tablet (40 mg total) by mouth daily.   sertraline 25 MG tablet Commonly known as: ZOLOFT Take 75 mg by mouth daily.   triamcinolone cream 0.1 % Commonly known as: KENALOG Apply 1 application. topically 2 (two) times daily as needed (For red area on left arm).   Vitamin D3 25 MCG (1000 UT) Caps Take 1,000 Units daily by mouth.   zinc oxide 20 % ointment Apply 1 application topically as needed for irritation. Apply to peri/buttocks after every incontinent episode        Review of Systems  Constitutional:  Positive for activity change, appetite change and unexpected weight change.  HENT: Negative.    Respiratory:  Negative for cough and shortness of breath.   Cardiovascular:  Negative for leg swelling.  Gastrointestinal:  Negative for constipation.  Genitourinary: Negative.   Musculoskeletal:  Positive for gait problem. Negative for arthralgias and myalgias.  Skin: Negative.   Neurological:  Negative for dizziness and weakness.  Psychiatric/Behavioral:  Positive for confusion. Negative for dysphoric mood and sleep disturbance. The patient is nervous/anxious.     Immunization History  Administered Date(s) Administered   DTaP 05/12/2014   Fluad Quad(high Dose 65+) 10/06/2022   Influenza, High Dose Seasonal PF 09/21/2017, 09/27/2019, 09/16/2020   Influenza,inj,Quad PF,6+ Mos 09/14/2018   Influenza-Unspecified 10/07/2014, 09/11/2015, 09/23/2016, 10/07/2021   Moderna Sars-Covid-2 Vaccination 12/17/2019, 01/14/2020, 10/27/2020, 05/20/2021   PPD Test 10/16/2014    Pneumococcal Conjugate-13 10/20/2017   Pneumococcal Polysaccharide-23 08/20/2011   Unspecified SARS-COV-2 Vaccination 05/26/2022, 10/19/2022   Pertinent  Health Maintenance Due  Topic Date Due   DEXA SCAN  Never done   INFLUENZA VACCINE  07/14/2023   HEMOGLOBIN A1C  09/17/2023   FOOT EXAM  07/12/2024   OPHTHALMOLOGY EXAM  Discontinued      04/14/2023   11:07 AM 05/16/2023   12:21 PM 06/17/2023    1:36 PM 07/13/2023   10:34 AM 08/05/2023    4:41 PM  Fall Risk  Falls in the past year? 0 1 1 1 1   Was there an injury with Fall? 0 0 0 0 1  Fall Risk Category Calculator 0 1 1 1 3   Patient at Risk for Falls Due  to No Fall Risks History of fall(s);Impaired balance/gait;Impaired mobility History of fall(s);Impaired balance/gait History of fall(s);Impaired balance/gait;Impaired mobility History of fall(s);Impaired balance/gait;Impaired mobility  Fall risk Follow up Falls evaluation completed Falls evaluation completed;Education provided;Falls prevention discussed Falls evaluation completed;Education provided;Falls prevention discussed Falls evaluation completed;Education provided;Falls prevention discussed Falls evaluation completed;Education provided   Functional Status Survey:    Vitals:   09/19/23 0933  BP: 126/67  Pulse: 73  Resp: 18  Temp: (!) 96.4 F (35.8 C)  SpO2: 97%  Weight: 134 lb 14.4 oz (61.2 kg)  Height: 5\' 2"  (1.575 m)   Body mass index is 24.67 kg/m. Physical Exam Vitals reviewed.  Constitutional:      Appearance: Normal appearance.  HENT:     Head: Normocephalic.     Nose: Nose normal.     Mouth/Throat:     Mouth: Mucous membranes are moist.     Pharynx: Oropharynx is clear.  Eyes:     Pupils: Pupils are equal, round, and reactive to light.  Cardiovascular:     Rate and Rhythm: Normal rate and regular rhythm.     Pulses: Normal pulses.     Heart sounds: Normal heart sounds. No murmur heard. Pulmonary:     Effort: Pulmonary effort is normal.     Breath  sounds: Normal breath sounds.  Abdominal:     General: Abdomen is flat. Bowel sounds are normal.     Palpations: Abdomen is soft.  Musculoskeletal:        General: No swelling.     Cervical back: Neck supple.  Skin:    General: Skin is warm.  Neurological:     General: No focal deficit present.     Mental Status: She is alert.     Comments: I did not notice any Slurring She does struggle with her words  Psychiatric:        Mood and Affect: Mood normal.        Thought Content: Thought content normal.     Labs reviewed: Recent Labs    02/03/23 0000 03/18/23 0000 09/16/23 0000  NA 143 141 145  K 4.2 4.1 3.6  CL 106 106 110*  CO2 31* 32* 27*  BUN 31* 24* 26*  CREATININE 1.2* 1.1 1.0  CALCIUM 9.4 8.8 8.7   Recent Labs    02/03/23 0000 03/18/23 0000 09/16/23 0000  AST 12* 12* 10*  ALT 16 13 12   ALKPHOS 71 58 73  ALBUMIN 4.0 3.5 3.4*   Recent Labs    02/03/23 0000 03/18/23 0000 09/16/23 0000  WBC 10.5 7.2 9.1  NEUTROABS  --  4,032.00 5,132.00  HGB 10.7* 9.4* 10.7*  HCT 34* 31* 34*  PLT 264 217 214   Lab Results  Component Value Date   TSH 2.77 08/24/2021   Lab Results  Component Value Date   HGBA1C 6.2 03/18/2023   Lab Results  Component Value Date   CHOL 109 03/18/2023   HDL 40 03/18/2023   LDLCALC 52 03/18/2023   TRIG 87 03/18/2023   CHOLHDL 6.3 03/06/2019    Significant Diagnostic Results in last 30 days:  No results found.  Assessment/Plan 1. Slurred speech ? TIA Patient does have history of CVA I D/W her Son inlaw Representing her daughter At her age she is already on Plavix and statin They agree that she would not be good candidate for further work up [employer] more for Comfort  2. Confusion Speech is working with her  Her Labs done few  days ago were all normal Check UA   3. Vascular dementia without behavioral disturbance (HCC) Total ADL care Already on Plavix and Statin Also oN Aricept and Namenda 4. Weight loss If  continues will consider Hospice  5 Anxiety Daughter worried about anxiety  She is already on Zoloft Will try Low Dose of Vistaril 10 mg PRN for 2 weeks   Family/ staff Communication: Talked to family Total time spent in this patient care encounter was  45_  minutes; greater than 50% of the visit spent counseling patient and staff, reviewing records , Labs and coordinating care for problems addressed at this encounter.    Labs/tests ordered:

## 2023-10-19 ENCOUNTER — Encounter: Payer: Self-pay | Admitting: Orthopedic Surgery

## 2023-10-19 ENCOUNTER — Non-Acute Institutional Stay (SKILLED_NURSING_FACILITY): Payer: PPO | Admitting: Orthopedic Surgery

## 2023-10-19 DIAGNOSIS — N1831 Chronic kidney disease, stage 3a: Secondary | ICD-10-CM | POA: Diagnosis not present

## 2023-10-19 DIAGNOSIS — D638 Anemia in other chronic diseases classified elsewhere: Secondary | ICD-10-CM

## 2023-10-19 DIAGNOSIS — F01C18 Vascular dementia, severe, with other behavioral disturbance: Secondary | ICD-10-CM

## 2023-10-19 DIAGNOSIS — F418 Other specified anxiety disorders: Secondary | ICD-10-CM

## 2023-10-19 DIAGNOSIS — Z8673 Personal history of transient ischemic attack (TIA), and cerebral infarction without residual deficits: Secondary | ICD-10-CM

## 2023-10-19 DIAGNOSIS — F424 Excoriation (skin-picking) disorder: Secondary | ICD-10-CM

## 2023-10-19 DIAGNOSIS — I10 Essential (primary) hypertension: Secondary | ICD-10-CM | POA: Diagnosis not present

## 2023-10-19 DIAGNOSIS — K219 Gastro-esophageal reflux disease without esophagitis: Secondary | ICD-10-CM

## 2023-10-19 DIAGNOSIS — E782 Mixed hyperlipidemia: Secondary | ICD-10-CM

## 2023-10-19 DIAGNOSIS — R7303 Prediabetes: Secondary | ICD-10-CM

## 2023-10-19 NOTE — Progress Notes (Signed)
Location:   [facility]  Nursing Home Room Number: 18-A Place of Service:  SNF [phone]) Provider:  Hazle Nordmann, NP  PCP: [name], MD  Patient Care Team: [name], MD as PCP - General (Internal Medicine) Ngetich, Donalee Citrin, NP as Nurse Practitioner (Family Medicine)  Extended Emergency Contact Information Primary Emergency Contact: Gordan,[place] Address: [address]          [place], [place] [address] [place] of [place] Home Phone: [phone] Mobile Phone: [phone] Relation: Daughter  Code Status:  DNR  Goals of care: Advanced Directive information    10/19/2023    9:37 AM  Advanced Directives  Does Patient Have a Medical Advance Directive? Yes  Type of Estate agent of [place];Living will;Out of facility DNR (pink MOST or yellow form)  Does patient want to make changes to medical advance directive? No - Patient declined  Copy of Healthcare Power of Attorney in Chart? Yes - validated most recent copy scanned in chart (See row information)     Chief Complaint  Patient presents with   Medical Management of Chronic Issues    Routine Visit.    Immunizations    Discuss the need for [employer].    Health Maintenance    Discuss the need for Dexa scan, and Hemoglobin A1C.     HPI:  Pt is a 89 y.o. female seen today for medical management of chronic diseases.    She currently resides on the skilled nursing unit at [facility]. Past medical conditions include: hypertension, CVA, dementia, GERD, diabetes, CKD stage 3, hyperlipidemia, depression, anxiety and abnormal gait.    HTN- BUN/creat 26/1.0 09/16/2023, remains on amlodipine HLD- LDL 55 03/26/2022, remains on Lipitor> AST/ALT 12/13 (04/05) CKD- BUN/creat 26/1.0 GFR 51 09/16/2023 Prediabetes- a1c 6.2 (04/05), remains on regular diet Dementia- CT head 02/2019 confirmed small white matter disease, BIMS 9/15 (09/17)> was 13/15 (06/19), no recent behaviors, dependent with  ADLs except feeding, remains on Aricept and Namenda  HX CVA- 10/06 increased confusion and slurred speech> lab workup normal> confusion improved, hx acute infarct on left Para median pons, remains on plavix and statin Depression/Anxiety- no mood changes, Na+ 145 09/16/2023, does not leave room often> like to play bingo, remains on Zoloft  Skin picking- remains on hydrocortisone cream for face prn and Kenalog cream prn for extremities Anemia- Hgb 10.7 (10/04)> was 9.4 (04/05), remains on ferrous sulfate 3x/week GERD- remains on Protonix  Fall without injury 10/26.   Flu vaccine given 10/30, tolerated well.   Recent blood pressures:  11/06- 124/74  11/05- 117/65, 138/71  11/04- 142/75  Recent weights:  11/02- 133 lbs  10/01- 134.9 lbs  09/03- 133.7 lbs      Past Medical History:  Diagnosis Date   Allergic rhinitis    Anxiety    Balance problem 04/28/2015   Fatigue 04/28/2015   Hearing loss 04/28/2015   history of Fracture of right humerus 04/28/2015   HLD (hyperlipidemia) 03/05/2019   Hyperglycemia    Hyperlipidemia    Hypertension    Memory loss 04/28/2015   04/26/2014 MMSE 28/30. Failed clock drawing. 04/21/15 MMSE 21/30. Failed clock drawing.    Osteoporosis    Past Surgical History:  Procedure Laterality Date   ABDOMINAL HYSTERECTOMY     CATARACT EXTRACTION  2010   ELBOW SURGERY Right 1988   FEMUR FRACTURE SURGERY Right 06/2014   [place], [place] Dr. Dione Housekeeper   HUMERUS FRACTURE SURGERY Right 2015   [place],  [place] Dr. Eber Hong   TONSILLECTOMY  [phone]    Allergies  Allergen Reactions   Ace Inhibitors Other (See Comments)    Unknown reaction   Almond Oil Rash   Plum Pulp Rash    Allergies as of 10/19/2023       Reactions   Ace Inhibitors Other (See Comments)   Unknown reaction   Almond Oil Rash   Plum Pulp Rash        Medication List        Accurate as of October 19, 2023  9:37 AM. If you have any questions, ask your nurse or doctor.           STOP taking these medications    hydrOXYzine 10 MG tablet Commonly known as: ATARAX Stopped by: Henri Guedes E Brionna Romanek       TAKE these medications    amLODipine 5 MG tablet Commonly known as: NORVASC Take 5 mg by mouth daily.   atorvastatin 40 MG tablet Commonly known as: LIPITOR Take 1 tablet (40 mg total) by mouth daily at 6 PM.   calcium carbonate 1500 (600 Ca) MG Tabs tablet Commonly known as: OSCAL Take 600 mg of elemental calcium by mouth daily with breakfast.   CeraVe Crea Apply 1 Application topically daily. Apply to back, face, legs, and arms.   clopidogrel 75 MG tablet Commonly known as: PLAVIX Take 75 mg by mouth at bedtime.   cyanocobalamin 1000 MCG tablet Commonly known as: VITAMIN B12 Take 1,000 mcg by mouth 3 (three) times a week. Monday, Wednesday, and Friday.   donepezil 10 MG tablet Commonly known as: ARICEPT TAKE ONE TABLET BY MOUTH DAILY TO PRESERVE MEMORY   ferrous sulfate 325 (65 FE) MG EC tablet Take 325 mg by mouth 3 (three) times a week. Give M/W/F   Fish Oil 500 MG Caps Take 1 capsule by mouth at bedtime.   hydrocortisone cream 0.5 % Apply 1 Application topically every 12 (twelve) hours as needed for itching.   lactose free nutrition Liqd Take 240 mLs by mouth in the morning and at bedtime.   memantine 10 MG tablet Commonly known as: Namenda Take 1 tablet (10 mg total) by mouth 2 (two) times daily.   pantoprazole 40 MG tablet Commonly known as: Protonix Take 1 tablet (40 mg total) by mouth daily.   sertraline 100 MG tablet Commonly known as: ZOLOFT Take 100 mg by mouth daily. What changed: Another medication with the same name was removed. Continue taking this medication, and follow the directions you see here. Changed by: Octavia Heir   triamcinolone cream 0.1 % Commonly known as: KENALOG Apply 1 application. topically 2 (two) times daily as needed (For red area on left arm).   Vitamin D3 25 MCG (1000 UT) Caps Take 1,000 Units  daily by mouth.   zinc oxide 20 % ointment Apply 1 application topically as needed for irritation. Apply to peri/buttocks after every incontinent episode        Review of Systems  Unable to perform ROS: Dementia    Immunization History  Administered Date(s) Administered   DTaP 05/12/2014   Fluad Quad(high Dose 65+) 10/06/2022   Influenza, High Dose Seasonal PF 09/21/2017, 09/27/2019, 09/16/2020, 10/12/2023   Influenza,inj,Quad PF,6+ Mos 09/14/2018   Influenza-Unspecified 10/07/2014, 09/11/2015, 09/23/2016, 10/07/2021   Moderna Sars-Covid-2 Vaccination 12/17/2019, 01/14/2020, 10/27/2020, 05/20/2021   PPD Test 10/16/2014   Pneumococcal Conjugate-13 10/20/2017   Pneumococcal Polysaccharide-23 08/20/2011   Unspecified SARS-COV-2 Vaccination 05/26/2022, 10/19/2022   Pertinent  Health Maintenance Due  Topic Date Due   DEXA SCAN  Never done   HEMOGLOBIN A1C  09/17/2023   FOOT EXAM  07/12/2024   INFLUENZA VACCINE  Completed   OPHTHALMOLOGY EXAM  Discontinued      04/14/2023   11:07 AM 05/16/2023   12:21 PM 06/17/2023    1:36 PM 07/13/2023   10:34 AM 08/05/2023    4:41 PM  Fall Risk  Falls in the past year? 0 1 1 1 1   Was there an injury with Fall? 0 0 0 0 1  Fall Risk Category Calculator 0 1 1 1 3   Patient at Risk for Falls Due to No Fall Risks History of fall(s);Impaired balance/gait;Impaired mobility History of fall(s);Impaired balance/gait History of fall(s);Impaired balance/gait;Impaired mobility History of fall(s);Impaired balance/gait;Impaired mobility  Fall risk Follow up Falls evaluation completed Falls evaluation completed;Education provided;Falls prevention discussed Falls evaluation completed;Education provided;Falls prevention discussed Falls evaluation completed;Education provided;Falls prevention discussed Falls evaluation completed;Education provided   Functional Status Survey:    Vitals:   10/19/23 0931  BP: 124/74  Pulse: 79  Resp: 18  Temp: (!) 97.1 F (36.2  C)  SpO2: 92%  Weight: 133 lb (60.3 kg)  Height: 5\' 2"  (1.575 m)   Body mass index is 24.33 kg/m. Physical Exam Vitals reviewed.  Constitutional:      General: She is not in acute distress. HENT:     Head: Normocephalic.     Right Ear: There is no impacted cerumen.     Left Ear: There is no impacted cerumen.     Nose: Nose normal.     Mouth/Throat:     Mouth: Mucous membranes are moist.  Eyes:     General:        Right eye: No discharge.        Left eye: No discharge.  Cardiovascular:     Rate and Rhythm: Normal rate and regular rhythm.     Pulses: Normal pulses.     Heart sounds: Normal heart sounds.  Pulmonary:     Effort: Pulmonary effort is normal. No respiratory distress.     Breath sounds: Normal breath sounds. No wheezing.  Abdominal:     General: Bowel sounds are normal. There is no distension.     Palpations: Abdomen is soft.     Tenderness: There is no abdominal tenderness.  Musculoskeletal:     Cervical back: Neck supple.     Right lower leg: No edema.     Left lower leg: No edema.  Skin:    General: Skin is warm.     Capillary Refill: Capillary refill takes less than 2 seconds.     Comments: Pea sized scabbed lesion to left nasal bridge, no sign of infection. Healed lesion to middle forehead and right cheek.   Neurological:     General: No focal deficit present.     Mental Status: She is alert. Mental status is at baseline.     Motor: Weakness present.     Gait: Gait abnormal.     Comments: Speech normal  Psychiatric:        Mood and Affect: Mood normal.     Comments: Follows commands, alert to self/familiar face     Labs reviewed: Recent Labs    02/03/23 0000 03/18/23 0000 09/16/23 0000  NA 143 141 145  K 4.2 4.1 3.6  CL 106 106 110*  CO2 31* 32* 27*  BUN 31* 24* 26*  CREATININE 1.2* 1.1 1.0  CALCIUM  9.4 8.8 8.7   Recent Labs    02/03/23 0000 03/18/23 0000 09/16/23 0000  AST 12* 12* 10*  ALT 16 13 12   ALKPHOS 71 58 73  ALBUMIN  4.0 3.5 3.4*   Recent Labs    02/03/23 0000 03/18/23 0000 09/16/23 0000  WBC 10.5 7.2 9.1  NEUTROABS  --  4,032.00 5,132.00  HGB 10.7* 9.4* 10.7*  HCT 34* 31* 34*  PLT 264 217 214   Lab Results  Component Value Date   TSH 2.77 08/24/2021   Lab Results  Component Value Date   HGBA1C 6.2 03/18/2023   Lab Results  Component Value Date   CHOL 109 03/18/2023   HDL 40 03/18/2023   LDLCALC 52 03/18/2023   TRIG 87 03/18/2023   CHOLHDL 6.3 03/06/2019    Significant Diagnostic Results in last 30 days:  No results found.  Assessment/Plan 1. Essential hypertension - controlled with amlodipine  2. Mixed hyperlipidemia - no recent lipid panel - cont atorvastatin - li[id panel- future  3. Stage 3a chronic kidney disease (HCC) - encourage hydration with water - avoid NSAIDS  4. Prediabetes - last A1c 6.2 03/2023 - diet controlled - A1c- future  5. Severe vascular dementia with other behavioral disturbance (HCC) - no behaviors - ambulates with wheelchair - weight stable - cont Aricept and Namenda  6. History of CVA (cerebrovascular accident) - 10/06 increased confusion/ slurred speech> labs normal - no aggressive workup per goals of care - cont Plavix and atorvastatin  7. Depression with anxiety - Na+ 145 09/16/2023 - stable with Zoloft   8. Skin-picking disorder - ongoing - lesion to left nasal bridge> no infection - cont hydrocortisone cream  9. Anemia, chronic disease - hgb stable - cont ferrous sulfate  10. Gastroesophageal reflux disease without esophagitis - see above - cont Protonix    Family/ staff Communication: plan discussed with patient and nurse  Labs/tests ordered:  A1c and lipid panel 11/07

## 2023-10-21 LAB — LIPID PANEL
Cholesterol: 120 (ref 0–200)
HDL: 36 (ref 35–70)
LDL Cholesterol: 65
Triglycerides: 109 (ref 40–160)

## 2023-10-21 LAB — HEMOGLOBIN A1C: Hemoglobin A1C: 5.9

## 2023-11-14 ENCOUNTER — Encounter: Payer: Self-pay | Admitting: Orthopedic Surgery

## 2023-11-14 ENCOUNTER — Non-Acute Institutional Stay (SKILLED_NURSING_FACILITY): Payer: PPO | Admitting: Orthopedic Surgery

## 2023-11-14 DIAGNOSIS — F01C18 Vascular dementia, severe, with other behavioral disturbance: Secondary | ICD-10-CM | POA: Diagnosis not present

## 2023-11-14 DIAGNOSIS — N1831 Chronic kidney disease, stage 3a: Secondary | ICD-10-CM

## 2023-11-14 DIAGNOSIS — D638 Anemia in other chronic diseases classified elsewhere: Secondary | ICD-10-CM

## 2023-11-14 DIAGNOSIS — F424 Excoriation (skin-picking) disorder: Secondary | ICD-10-CM

## 2023-11-14 DIAGNOSIS — E782 Mixed hyperlipidemia: Secondary | ICD-10-CM

## 2023-11-14 DIAGNOSIS — I10 Essential (primary) hypertension: Secondary | ICD-10-CM | POA: Diagnosis not present

## 2023-11-14 DIAGNOSIS — R7303 Prediabetes: Secondary | ICD-10-CM | POA: Diagnosis not present

## 2023-11-14 DIAGNOSIS — F339 Major depressive disorder, recurrent, unspecified: Secondary | ICD-10-CM

## 2023-11-14 DIAGNOSIS — K219 Gastro-esophageal reflux disease without esophagitis: Secondary | ICD-10-CM

## 2023-11-14 NOTE — Progress Notes (Signed)
Location:   [facility] Room Number: 18-A Place of Service:  SNF [phone]) Provider:  Hazle Nordmann, NP  PCP: [name], MD  Patient Care Team: [name], MD as PCP - General (Internal Medicine) Ngetich, Donalee Citrin, NP as Nurse Practitioner (Family Medicine)  Extended Emergency Contact Information Primary Emergency Contact: Gordan,[place] Address: [address]          [place], [place] [address] Darden Amber of [place] Home Phone: [phone] Mobile Phone: [phone] Relation: Daughter  Code Status:  DNR Goals of care: Advanced Directive information    11/14/2023   11:06 AM  Advanced Directives  Does Patient Have a Medical Advance Directive? Yes  Type of Estate agent of [place];Living will;Out of facility DNR (pink MOST or yellow form)  Does patient want to make changes to medical advance directive? No - Patient declined  Copy of Healthcare Power of Attorney in Chart? Yes - validated most recent copy scanned in chart (See row information)     Chief Complaint  Patient presents with   Medical Management of Chronic Issues    Routine Visit.    Immunizations    Discuss the need for Covid Booster,    Health Maintenance    Discuss the need for Dexa scan, and Hemoglobin A1C.     HPI:  Pt is a 89 y.o. female seen today for medical management of chronic diseases.    She currently resides on the skilled nursing unit at [facility]. Past medical conditions include: hypertension, CVA, dementia, GERD, diabetes, CKD stage 3, hyperlipidemia, depression, anxiety and abnormal gait.    Prediabetes- A1c 5.9 (11/08)> was 6.2 (04/05), remains on regular diet Dementia- CT head 02/2019 confirmed small white matter disease, BIMS 9/15 (09/17)> was 13/15 (06/19), no recent behaviors, sleeping more> some weight loss> 5 lbs, dependent with ADLs except feeding, remains on Aricept and Namenda  Skin picking- remains on hydrocortisone cream for face prn  and Kenalog cream prn for extremities HTN- BUN/creat 26/1.0 09/16/2023, remains on amlodipine HLD- LDL 65 (11/08), remains on Lipitor> AST/ALT 12/13 (04/05) CKD- BUN/creat 26/1.0 GFR 51 09/16/2023 HX CVA-  hx acute infarct on left Para median pons, remains on plavix and statin Depression/Anxiety- no mood changes, Na+ 145 09/16/2023, remains on Zoloft  Anemia- Hgb 10.7 (10/04)> was 9.4 (04/05), remains on ferrous sulfate 3x/week GERD- remains on Protonix  Fall 10/26, no apparent injury.   Recent weights:  12/01- 129.2 lbs  11/01- 134.6 lbs  10/01- 134.9 lbs   Recent blood pressures:  12/02- 136/78  12/01- 125/71  11/30- 120/60   Past Medical History:  Diagnosis Date   Allergic rhinitis    Anxiety    Balance problem 04/28/2015   Fatigue 04/28/2015   Hearing loss 04/28/2015   history of Fracture of right humerus 04/28/2015   HLD (hyperlipidemia) 03/05/2019   Hyperglycemia    Hyperlipidemia    Hypertension    Memory loss 04/28/2015   04/26/2014 MMSE 28/30. Failed clock drawing. 04/21/15 MMSE 21/30. Failed clock drawing.    Osteoporosis    Past Surgical History:  Procedure Laterality Date   ABDOMINAL HYSTERECTOMY     CATARACT EXTRACTION  2010   ELBOW SURGERY Right 1988   FEMUR FRACTURE SURGERY Right 06/2014   [place], [place] Dr. Dione Housekeeper   HUMERUS FRACTURE SURGERY Right 2015   [place], [place] Dr. Eber Hong   TONSILLECTOMY  [phone]    Allergies  Allergen Reactions   Ace Inhibitors Other (See  Comments)    Unknown reaction   Almond Oil Rash   Plum Pulp Rash    Allergies as of 11/14/2023       Reactions   Ace Inhibitors Other (See Comments)   Unknown reaction   Almond Oil Rash   Plum Pulp Rash        Medication List        Accurate as of November 14, 2023 11:06 AM. If you have any questions, ask your nurse or doctor.          amLODipine 5 MG tablet Commonly known as: NORVASC Take 5 mg by mouth daily.   atorvastatin 40 MG tablet Commonly known as:  LIPITOR Take 1 tablet (40 mg total) by mouth daily at 6 PM.   calcium carbonate 1500 (600 Ca) MG Tabs tablet Commonly known as: OSCAL Take 600 mg of elemental calcium by mouth daily with breakfast.   CeraVe Crea Apply 1 Application topically daily. Apply to back, face, legs, and arms.   clopidogrel 75 MG tablet Commonly known as: PLAVIX Take 75 mg by mouth at bedtime.   cyanocobalamin 1000 MCG tablet Commonly known as: VITAMIN B12 Take 1,000 mcg by mouth 3 (three) times a week. Monday, Wednesday, and Friday.   donepezil 10 MG tablet Commonly known as: ARICEPT TAKE ONE TABLET BY MOUTH DAILY TO PRESERVE MEMORY   ferrous sulfate 325 (65 FE) MG EC tablet Take 325 mg by mouth 3 (three) times a week. Give M/W/F   Fish Oil 500 MG Caps Take 1 capsule by mouth at bedtime.   hydrocortisone cream 0.5 % Apply 1 Application topically every 12 (twelve) hours as needed for itching.   lactose free nutrition Liqd Take 240 mLs by mouth in the morning and at bedtime.   memantine 10 MG tablet Commonly known as: Namenda Take 1 tablet (10 mg total) by mouth 2 (two) times daily.   pantoprazole 20 MG tablet Commonly known as: PROTONIX Take 20 mg by mouth daily. What changed: Another medication with the same name was removed. Continue taking this medication, and follow the directions you see here. Changed by: Octavia Heir   sertraline 100 MG tablet Commonly known as: ZOLOFT Take 100 mg by mouth daily.   triamcinolone cream 0.1 % Commonly known as: KENALOG Apply 1 application. topically 2 (two) times daily as needed (For red area on left arm).   Vitamin D3 25 MCG (1000 UT) Caps Take 1,000 Units daily by mouth.   zinc oxide 20 % ointment Apply 1 application topically as needed for irritation. Apply to peri/buttocks after every incontinent episode        Review of Systems  Unable to perform ROS: Dementia    Immunization History  Administered Date(s) Administered   DTaP  05/12/2014   Fluad Quad(high Dose 65+) 10/06/2022   Influenza, High Dose Seasonal PF 09/21/2017, 09/27/2019, 09/16/2020, 10/12/2023   Influenza,inj,Quad PF,6+ Mos 09/14/2018   Influenza-Unspecified 10/07/2014, 09/11/2015, 09/23/2016, 10/07/2021   Moderna Sars-Covid-2 Vaccination 12/17/2019, 01/14/2020, 10/27/2020, 05/20/2021   PPD Test 10/16/2014   Pneumococcal Conjugate-13 10/20/2017   Pneumococcal Polysaccharide-23 08/20/2011   Unspecified SARS-COV-2 Vaccination 05/26/2022, 10/19/2022   Pertinent  Health Maintenance Due  Topic Date Due   DEXA SCAN  Never done   HEMOGLOBIN A1C  04/19/2024   FOOT EXAM  07/12/2024   INFLUENZA VACCINE  Completed   OPHTHALMOLOGY EXAM  Discontinued      04/14/2023   11:07 AM 05/16/2023   12:21 PM 06/17/2023  1:36 PM 07/13/2023   10:34 AM 08/05/2023    4:41 PM  Fall Risk  Falls in the past year? 0 1 1 1 1   Was there an injury with Fall? 0 0 0 0 1  Fall Risk Category Calculator 0 1 1 1 3   Patient at Risk for Falls Due to No Fall Risks History of fall(s);Impaired balance/gait;Impaired mobility History of fall(s);Impaired balance/gait History of fall(s);Impaired balance/gait;Impaired mobility History of fall(s);Impaired balance/gait;Impaired mobility  Fall risk Follow up Falls evaluation completed Falls evaluation completed;Education provided;Falls prevention discussed Falls evaluation completed;Education provided;Falls prevention discussed Falls evaluation completed;Education provided;Falls prevention discussed Falls evaluation completed;Education provided   Functional Status Survey:    Vitals:   11/14/23 1059  BP: 136/78  Pulse: 79  Resp: 18  Temp: 97.6 F (36.4 C)  SpO2: 93%  Weight: 129 lb 3.2 oz (58.6 kg)  Height: 5\' 2"  (1.575 m)   Body mass index is 23.63 kg/m. Physical Exam Vitals reviewed.  Constitutional:      General: She is not in acute distress. HENT:     Head: Normocephalic.     Right Ear: There is no impacted cerumen.     Left  Ear: There is no impacted cerumen.     Nose: Nose normal.     Mouth/Throat:     Mouth: Mucous membranes are moist.  Eyes:     General:        Right eye: No discharge.        Left eye: No discharge.  Cardiovascular:     Rate and Rhythm: Normal rate and regular rhythm.     Pulses: Normal pulses.     Heart sounds: Normal heart sounds.  Pulmonary:     Effort: Pulmonary effort is normal. No respiratory distress.     Breath sounds: Normal breath sounds. No wheezing or rales.  Abdominal:     General: Bowel sounds are normal.     Palpations: Abdomen is soft.  Musculoskeletal:     Cervical back: Neck supple.     Right lower leg: No edema.     Left lower leg: No edema.  Skin:    General: Skin is warm.     Capillary Refill: Capillary refill takes less than 2 seconds.     Findings: Lesion present.     Comments: Pea sized scabbed lesion to left upper nasal bridge> near inner canthus, no sign of infection.   Neurological:     General: No focal deficit present.     Mental Status: She is alert. Mental status is at baseline.     Motor: Weakness present.     Gait: Gait abnormal.  Psychiatric:        Mood and Affect: Mood normal.     Labs reviewed: Recent Labs    02/03/23 0000 03/18/23 0000 09/16/23 0000  NA 143 141 145  K 4.2 4.1 3.6  CL 106 106 110*  CO2 31* 32* 27*  BUN 31* 24* 26*  CREATININE 1.2* 1.1 1.0  CALCIUM 9.4 8.8 8.7   Recent Labs    02/03/23 0000 03/18/23 0000 09/16/23 0000  AST 12* 12* 10*  ALT 16 13 12   ALKPHOS 71 58 73  ALBUMIN 4.0 3.5 3.4*   Recent Labs    02/03/23 0000 03/18/23 0000 09/16/23 0000  WBC 10.5 7.2 9.1  NEUTROABS  --  4,032.00 5,132.00  HGB 10.7* 9.4* 10.7*  HCT 34* 31* 34*  PLT 264 217 214   Lab Results  Component Value  Date   TSH 2.77 08/24/2021   Lab Results  Component Value Date   HGBA1C 5.9 10/21/2023   Lab Results  Component Value Date   CHOL 120 10/21/2023   HDL 36 10/21/2023   LDLCALC 65 10/21/2023   TRIG 109  10/21/2023   CHOLHDL 6.3 03/06/2019    Significant Diagnostic Results in last 30 days:  No results found.  Assessment/Plan 1. Prediabetes - recent A1c 5.9> was 6.2 - diet controlled - do not recommend future f/u if weight continues to decline  2. Severe vascular dementia with other behavioral disturbance (HCC) - no behaviors - sleeping more and eating less - some weight loss> approx 5 lbs in 2 months - ambulates with wheelchair - cont Namenda and Aricept  3. Skin-picking disorder - ongoing - no sign on infection - associated with AD - cont hydrocortisone and kenalog  - cont Zoloft  4. Essential hypertension - controlled with amlodipine   5. Mixed hyperlipidemia - LDL at goal < 70 - cont atorvastatin  6. Stage 3a chronic kidney disease (HCC) - encourage hydration with water - avoid NSAIDS  7. Depression, recurrent (HCC) - no mood changes - Na+ stable - cont Zoloft  8. Anemia, chronic disease - hgb stable - cont ferrous sulfate  9. Gastroesophageal reflux disease without esophagitis - see above - cont pantoprazole   Family/ staff Communication: plan discussed with patient and nurse  Labs/tests ordered: none

## 2023-11-22 ENCOUNTER — Encounter: Payer: Self-pay | Admitting: Adult Health

## 2023-11-22 ENCOUNTER — Non-Acute Institutional Stay (SKILLED_NURSING_FACILITY): Payer: PPO | Admitting: Adult Health

## 2023-11-22 DIAGNOSIS — F339 Major depressive disorder, recurrent, unspecified: Secondary | ICD-10-CM

## 2023-11-22 DIAGNOSIS — B372 Candidiasis of skin and nail: Secondary | ICD-10-CM

## 2023-11-22 DIAGNOSIS — F01C18 Vascular dementia, severe, with other behavioral disturbance: Secondary | ICD-10-CM

## 2023-11-22 MED ORDER — NYSTATIN 100000 UNIT/GM EX CREA: 1.0000 | TOPICAL_CREAM | Freq: Two times a day (BID) | CUTANEOUS | 0 refills | Status: DC

## 2023-11-22 NOTE — Progress Notes (Signed)
Location:  [facility] Room Number: N18-A Place of Service:  SNF (31) Provider:  Kenard Gower, DNP, FNP-BC  Patient Care Team: [name], MD as PCP - General (Internal Medicine) Ngetich, Donalee Citrin, NP as Nurse Practitioner (Family Medicine)  Extended Emergency Contact Information Primary Emergency Contact: Gordan,[place] Address: [address]          [place], [place] [address] [place] of [place] Home Phone: [phone] Mobile Phone: [phone] Relation: Daughter  Code Status:  DNR  Goals of care: Advanced Directive information    11/22/2023    9:56 AM  Advanced Directives  Does Patient Have a Medical Advance Directive? Yes  Type of Estate agent of [place];Living will;Out of facility DNR (pink MOST or yellow form)  Does patient want to make changes to medical advance directive? No - Patient declined  Copy of Healthcare Power of Attorney in Chart? Yes - validated most recent copy scanned in chart (See row information)  Pre-existing out of facility DNR order (yellow form or pink MOST form) Yellow form placed in chart (order not valid for inpatient use)     Chief Complaint  Patient presents with   Acute Visit    rashes under bilateral breasts     HPI:  Pt is a 89 y.o. female seen today for an acute visit for erythematous rashes under bilateral breasts. She is a resident of [id number]. BIMS score 9/15, ranging as moderate cognitive impairment. She takes Mematine and Aricept for dementia. She takes Sertraline for depression.   Past Medical History:  Diagnosis Date   Allergic rhinitis    Anxiety    Balance problem 04/28/2015   Fatigue 04/28/2015   Hearing loss 04/28/2015   history of Fracture of right humerus 04/28/2015   HLD (hyperlipidemia) 03/05/2019   Hyperglycemia    Hyperlipidemia    Hypertension    Memory loss 04/28/2015   04/26/2014 MMSE 28/30. Failed clock drawing. 04/21/15 MMSE 21/30. Failed  clock drawing.    Osteoporosis    Past Surgical History:  Procedure Laterality Date   ABDOMINAL HYSTERECTOMY     CATARACT EXTRACTION  2010   ELBOW SURGERY Right 1988   FEMUR FRACTURE SURGERY Right 06/2014   [place], [place] Dr. Dione Housekeeper   HUMERUS FRACTURE SURGERY Right 2015   [place], [place] Dr. Eber Hong   TONSILLECTOMY  [phone]    Allergies  Allergen Reactions   Ace Inhibitors Other (See Comments)    Unknown reaction   Almond Oil Rash   Plum Pulp Rash    Outpatient Encounter Medications as of 11/22/2023  Medication Sig   amLODipine (NORVASC) 5 MG tablet Take 5 mg by mouth daily.    atorvastatin (LIPITOR) 40 MG tablet Take 1 tablet (40 mg total) by mouth daily at 6 PM.   calcium carbonate (OSCAL) 1500 (600 Ca) MG TABS tablet Take 600 mg of elemental calcium by mouth daily with breakfast.   Cholecalciferol (VITAMIN D3) 1000 UNITS CAPS Take 1,000 Units daily by mouth.    clopidogrel (PLAVIX) 75 MG tablet Take 75 mg by mouth at bedtime.   cyanocobalamin (VITAMIN B12) 1000 MCG tablet Take 1,000 mcg by mouth 3 (three) times a week. Monday, Wednesday, and Friday.   donepezil (ARICEPT) 10 MG tablet TAKE ONE TABLET BY MOUTH DAILY TO PRESERVE MEMORY   Emollient (CERAVE) CREA Apply 1 Application topically daily. Apply to back, face, legs, and arms.   ferrous sulfate 325 (65 FE) MG EC tablet  Take 325 mg by mouth 3 (three) times a week. Give M/W/F   hydrocortisone cream 0.5 % Apply 1 Application topically every 12 (twelve) hours as needed for itching.   lactose free nutrition (BOOST) LIQD Take 240 mLs by mouth in the morning and at bedtime.   memantine (NAMENDA) 10 MG tablet Take 1 tablet (10 mg total) by mouth 2 (two) times daily.   Omega-3 Fatty Acids (FISH OIL) 500 MG CAPS Take 1 capsule by mouth at bedtime.   pantoprazole (PROTONIX) 20 MG tablet Take 20 mg by mouth daily.   sertraline (ZOLOFT) 100 MG tablet Take 100 mg by mouth daily.   triamcinolone cream (KENALOG) 0.1 % Apply 1  application. topically 2 (two) times daily as needed (For red area on left arm).   zinc oxide 20 % ointment Apply 1 application topically as needed for irritation. Apply to peri/buttocks after every incontinent episode   No facility-administered encounter medications on file as of 11/22/2023.    Review of Systems  Constitutional:  Negative for appetite change, chills, fatigue and fever.  HENT:  Negative for congestion, hearing loss, rhinorrhea and sore throat.   Eyes: Negative.   Respiratory:  Negative for cough, shortness of breath and wheezing.   Cardiovascular:  Negative for chest pain, palpitations and leg swelling.  Gastrointestinal:  Negative for abdominal pain, constipation, diarrhea, nausea and vomiting.  Genitourinary:  Negative for dysuria.  Musculoskeletal:  Negative for arthralgias, back pain and myalgias.  Skin:  Positive for rash. Negative for color change and wound.  Neurological:  Negative for dizziness, weakness and headaches.  Psychiatric/Behavioral:  Negative for behavioral problems. The patient is not nervous/anxious.     Unable to obtain due to dementia.    Immunization History  Administered Date(s) Administered   DTaP 05/12/2014   Fluad Quad(high Dose 65+) 10/06/2022   Influenza, High Dose Seasonal PF 09/21/2017, 09/27/2019, 09/16/2020, 10/12/2023   Influenza,inj,Quad PF,6+ Mos 09/14/2018   Influenza-Unspecified 10/07/2014, 09/11/2015, 09/23/2016, 10/07/2021   Moderna Covid-19 Vaccine Bivalent Booster 56yrs & up 10/19/2023   Moderna Sars-Covid-2 Vaccination 12/17/2019, 01/14/2020, 10/27/2020, 05/20/2021   PPD Test 10/16/2014   Pneumococcal Conjugate-13 10/20/2017   Pneumococcal Polysaccharide-23 08/20/2011   Unspecified SARS-COV-2 Vaccination 05/26/2022, 10/19/2022   Pertinent  Health Maintenance Due  Topic Date Due   DEXA SCAN  Never done   HEMOGLOBIN A1C  04/19/2024   FOOT EXAM  07/12/2024   INFLUENZA VACCINE  Completed   OPHTHALMOLOGY EXAM   Discontinued      04/14/2023   11:07 AM 05/16/2023   12:21 PM 06/17/2023    1:36 PM 07/13/2023   10:34 AM 08/05/2023    4:41 PM  Fall Risk  Falls in the past year? 0 1 1 1 1   Was there an injury with Fall? 0 0 0 0 1  Fall Risk Category Calculator 0 1 1 1 3   Patient at Risk for Falls Due to No Fall Risks History of fall(s);Impaired balance/gait;Impaired mobility History of fall(s);Impaired balance/gait History of fall(s);Impaired balance/gait;Impaired mobility History of fall(s);Impaired balance/gait;Impaired mobility  Fall risk Follow up Falls evaluation completed Falls evaluation completed;Education provided;Falls prevention discussed Falls evaluation completed;Education provided;Falls prevention discussed Falls evaluation completed;Education provided;Falls prevention discussed Falls evaluation completed;Education provided     Vitals:   11/22/23 0937  BP: 137/75  Pulse: 70  Resp: 13  Temp: (!) 97.5 F (36.4 C)  SpO2: 99%  Weight: 129 lb 3.2 oz (58.6 kg)  Height: 5\' 2"  (1.575 m)   Body mass index is  23.63 kg/m.  Physical Exam Constitutional:      General: She is not in acute distress.    Appearance: Normal appearance.  HENT:     Head: Normocephalic and atraumatic.     Nose: Nose normal.     Mouth/Throat:     Mouth: Mucous membranes are moist.  Eyes:     Conjunctiva/sclera: Conjunctivae normal.  Cardiovascular:     Rate and Rhythm: Normal rate and regular rhythm.  Pulmonary:     Effort: Pulmonary effort is normal.     Breath sounds: Normal breath sounds.  Abdominal:     General: Bowel sounds are normal.     Palpations: Abdomen is soft.  Musculoskeletal:        General: Normal range of motion.     Cervical back: Normal range of motion.  Skin:    General: Skin is warm and dry.     Findings: Erythema and rash present.     Comments: Erythematous under bilateral breast, R > L  Neurological:     Mental Status: She is alert.  Psychiatric:        Mood and Affect: Mood  normal.        Behavior: Behavior normal.        Labs reviewed: Recent Labs    02/03/23 0000 03/18/23 0000 09/16/23 0000  NA 143 141 145  K 4.2 4.1 3.6  CL 106 106 110*  CO2 31* 32* 27*  BUN 31* 24* 26*  CREATININE 1.2* 1.1 1.0  CALCIUM 9.4 8.8 8.7   Recent Labs    02/03/23 0000 03/18/23 0000 09/16/23 0000  AST 12* 12* 10*  ALT 16 13 12   ALKPHOS 71 58 73  ALBUMIN 4.0 3.5 3.4*   Recent Labs    02/03/23 0000 03/18/23 0000 09/16/23 0000  WBC 10.5 7.2 9.1  NEUTROABS  --  4,032.00 5,132.00  HGB 10.7* 9.4* 10.7*  HCT 34* 31* 34*  PLT 264 217 214   Lab Results  Component Value Date   TSH 2.77 08/24/2021   Lab Results  Component Value Date   HGBA1C 5.9 10/21/2023   Lab Results  Component Value Date   CHOL 120 10/21/2023   HDL 36 10/21/2023   LDLCALC 65 10/21/2023   TRIG 109 10/21/2023   CHOLHDL 6.3 03/06/2019    Significant Diagnostic Results in last 30 days:  No results found.  Assessment/Plan  1. Candidal skin infection (Primary) - keep skin clean and dry - nystatin cream (MYCOSTATIN); Apply 1 Application topically 2 (two) times daily.  Dispense: 30 g; Refill: 0  2. Depression, recurrent (HCC) -  mood is stable -  continue Sertraline  3. Severe vascular dementia with other behavioral disturbance (HCC) -  has moderate cognitive impairment -  continue Memantine and Aricept -   fall precautions   Family/ staff Communication: Discussed plan of care with resident and charge nurse.  Labs/tests ordered: None    Kenard Gower, DNP, MSN, FNP-BC [facility] [phone] (Monday-Friday 8:00 a.m. - 5:00 p.m.) [phone] (after hours)

## 2023-12-15 ENCOUNTER — Non-Acute Institutional Stay (SKILLED_NURSING_FACILITY): Payer: Self-pay | Admitting: Internal Medicine

## 2023-12-15 DIAGNOSIS — K219 Gastro-esophageal reflux disease without esophagitis: Secondary | ICD-10-CM

## 2023-12-15 DIAGNOSIS — F424 Excoriation (skin-picking) disorder: Secondary | ICD-10-CM

## 2023-12-15 DIAGNOSIS — N1831 Chronic kidney disease, stage 3a: Secondary | ICD-10-CM

## 2023-12-15 DIAGNOSIS — I10 Essential (primary) hypertension: Secondary | ICD-10-CM

## 2023-12-15 DIAGNOSIS — F01C18 Vascular dementia, severe, with other behavioral disturbance: Secondary | ICD-10-CM | POA: Diagnosis not present

## 2023-12-15 DIAGNOSIS — D638 Anemia in other chronic diseases classified elsewhere: Secondary | ICD-10-CM

## 2023-12-15 DIAGNOSIS — E782 Mixed hyperlipidemia: Secondary | ICD-10-CM

## 2023-12-15 DIAGNOSIS — F339 Major depressive disorder, recurrent, unspecified: Secondary | ICD-10-CM

## 2023-12-15 DIAGNOSIS — Z8673 Personal history of transient ischemic attack (TIA), and cerebral infarction without residual deficits: Secondary | ICD-10-CM | POA: Diagnosis not present

## 2023-12-18 ENCOUNTER — Encounter: Payer: Self-pay | Admitting: Internal Medicine

## 2023-12-18 NOTE — Progress Notes (Signed)
 Location:  [facility] Service:  SNF (31)  Provider:   Code Status: DNR Goals of Care:     11/22/2023    9:56 AM  Advanced Directives  Does Patient Have a Medical Advance Directive? Yes  Type of Estate Agent of [place];Living will;Out of facility DNR (pink MOST or yellow form)  Does patient want to make changes to medical advance directive? No - Patient declined  Copy of Healthcare Power of Attorney in Chart? Yes - validated most recent copy scanned in chart (See row information)  Pre-existing out of facility DNR order (yellow form or pink MOST form) Yellow form placed in chart (order not valid for inpatient use)     Chief Complaint  Patient presents with   Care Management    HPI: Patient is a 89 y.o. female seen today for medical management of chronic diseases.   Lives in SNF in [facility]   Patient has history of dementia, anxiety, With Skin Picking  hypertension and hyperlipidemia  History of  Acute CVA  MRI showed Acute infarction on Left Para median Pons      She is stable. No new Nursing issues. No Behavior issues Her weight is stable Wheelchair Dependent Able to Pivot and do her transfers  No Falls Wt Readings from Last 3 Encounters:  12/18/23 129 lb (58.5 kg)  11/22/23 129 lb 3.2 oz (58.6 kg)  11/14/23 129 lb 3.2 oz (58.6 kg)    Past Medical History:  Diagnosis Date   Allergic rhinitis    Anxiety    Balance problem 04/28/2015   Fatigue 04/28/2015   Hearing loss 04/28/2015   history of Fracture of right humerus 04/28/2015   HLD (hyperlipidemia) 03/05/2019   Hyperglycemia    Hyperlipidemia    Hypertension    Memory loss 04/28/2015   04/26/2014 MMSE 28/30. Failed clock drawing. 04/21/15 MMSE 21/30. Failed clock drawing.    Osteoporosis     Past Surgical History:  Procedure Laterality Date   ABDOMINAL HYSTERECTOMY     CATARACT EXTRACTION  2010   ELBOW SURGERY Right 1988   FEMUR FRACTURE SURGERY Right 06/2014    [place], [place] Dr. Norleen Savin   HUMERUS FRACTURE SURGERY Right 2015   [place], [place] Dr. Redell Pinal   TONSILLECTOMY  [phone]    Allergies  Allergen Reactions   Ace Inhibitors Other (See Comments)    Unknown reaction   Almond Oil Rash   Plum Pulp Rash    Outpatient Encounter Medications as of 12/15/2023  Medication Sig   amLODipine (NORVASC) 5 MG tablet Take 5 mg by mouth daily.    atorvastatin  (LIPITOR) 40 MG tablet Take 1 tablet (40 mg total) by mouth daily at 6 PM.   calcium  carbonate (OSCAL) 1500 (600 Ca) MG TABS tablet Take 600 mg of elemental calcium  by mouth daily with breakfast.   Cholecalciferol (VITAMIN D3) 1000 UNITS CAPS Take 1,000 Units daily by mouth.    clopidogrel  (PLAVIX ) 75 MG tablet Take 75 mg by mouth at bedtime.   cyanocobalamin  (VITAMIN B12) 1000 MCG tablet Take 1,000 mcg by mouth 3 (three) times a week. Monday, Wednesday, and Friday.   donepezil  (ARICEPT ) 10 MG tablet TAKE ONE TABLET BY MOUTH DAILY TO PRESERVE MEMORY   Emollient (CERAVE) CREA Apply 1 Application topically daily. Apply to back, face, legs, and arms.   ferrous sulfate 325 (65 FE) MG EC tablet Take 325 mg by mouth 3 (three) times a week. Give M/W/F  hydrocortisone  cream 0.5 % Apply 1 Application topically every 12 (twelve) hours as needed for itching.   lactose free nutrition (BOOST) LIQD Take 240 mLs by mouth in the morning and at bedtime.   memantine  (NAMENDA ) 10 MG tablet Take 1 tablet (10 mg total) by mouth 2 (two) times daily.   nystatin  cream (MYCOSTATIN ) Apply 1 Application topically 2 (two) times daily.   Omega-3 Fatty Acids (FISH OIL ) 500 MG CAPS Take 1 capsule by mouth at bedtime.   pantoprazole  (PROTONIX ) 20 MG tablet Take 20 mg by mouth daily.   sertraline (ZOLOFT) 100 MG tablet Take 100 mg by mouth daily.   triamcinolone  cream (KENALOG ) 0.1 % Apply 1 application. topically 2 (two) times daily as needed (For red area on left arm).   zinc oxide 20 % ointment Apply 1 application  topically as needed for irritation. Apply to peri/buttocks after every incontinent episode   No facility-administered encounter medications on file as of 12/15/2023.    Review of Systems:  Review of Systems  Unable to perform ROS: Dementia    Health Maintenance  Topic Date Due   DEXA SCAN  Never done   COVID-19 Vaccine (8 - 2024-25 season) 12/14/2023   HEMOGLOBIN A1C  04/19/2024   DTaP/Tdap/Td (2 - Tdap) 05/12/2024   FOOT EXAM  07/12/2024   Medicare Annual Wellness (AWV)  07/12/2024   Pneumonia Vaccine 15+ Years old  Completed   INFLUENZA VACCINE  Completed   HPV VACCINES  Aged Out   OPHTHALMOLOGY EXAM  Discontinued   Zoster Vaccines- Shingrix  Discontinued    Physical Exam: Vitals:   12/18/23 1700  BP: (!) 142/79  Pulse: 71  Temp: (!) 97 F (36.1 C)  Weight: 129 lb (58.5 kg)   Body mass index is 23.59 kg/m. Physical Exam Vitals reviewed.  Constitutional:      Appearance: Normal appearance.  HENT:     Head: Normocephalic.     Nose: Nose normal.     Mouth/Throat:     Mouth: Mucous membranes are moist.     Pharynx: Oropharynx is clear.  Eyes:     Pupils: Pupils are equal, round, and reactive to light.  Cardiovascular:     Rate and Rhythm: Normal rate and regular rhythm.     Pulses: Normal pulses.     Heart sounds: Normal heart sounds. No murmur heard. Pulmonary:     Effort: Pulmonary effort is normal.     Breath sounds: Normal breath sounds.  Abdominal:     General: Abdomen is flat. Bowel sounds are normal.     Palpations: Abdomen is soft.  Musculoskeletal:        General: No swelling.     Cervical back: Neck supple.  Skin:    General: Skin is warm.  Neurological:     General: No focal deficit present.     Mental Status: She is alert.  Psychiatric:        Mood and Affect: Mood normal.        Thought Content: Thought content normal.     Labs reviewed: Basic Metabolic Panel: Recent Labs    02/03/23 0000 03/18/23 0000 09/16/23 0000  NA 143 141  145  K 4.2 4.1 3.6  CL 106 106 110*  CO2 31* 32* 27*  BUN 31* 24* 26*  CREATININE 1.2* 1.1 1.0  CALCIUM  9.4 8.8 8.7   Liver Function Tests: Recent Labs    02/03/23 0000 03/18/23 0000 09/16/23 0000  AST 12* 12* 10*  ALT 16 13 12   ALKPHOS 71 58 73  ALBUMIN 4.0 3.5 3.4*   No results for input(s): LIPASE, AMYLASE in the last 8760 hours. No results for input(s): AMMONIA in the last 8760 hours. CBC: Recent Labs    02/03/23 0000 03/18/23 0000 09/16/23 0000  WBC 10.5 7.2 9.1  NEUTROABS  --  4,032.00 5,132.00  HGB 10.7* 9.4* 10.7*  HCT 34* 31* 34*  PLT 264 217 214   Lipid Panel: Recent Labs    03/18/23 0000 10/21/23 0000  CHOL 109 120  HDL 40 36  LDLCALC 52 65  TRIG 87 109   Lab Results  Component Value Date   HGBA1C 5.9 10/21/2023    Procedures since last visit: No results found.  Assessment/Plan 1. History of CVA (cerebrovascular accident) (Primary) Plavix  and statin  2. Severe vascular dementia with other behavioral disturbance (HCC) SNF level care On Plavix  and statin Also on Aricept  and Namenda  3. Depression, recurrent (HCC) Zoloft  4. Skin-picking disorder Zoloft has helped  5. Essential hypertension Amlodipine  6. Mixed hyperlipidemia Statin LDL at goal  7. Stage 3a chronic kidney disease (HCC) Creat stable  8. Anemia, chronic disease On Iron and B 12  9. Gastroesophageal reflux disease without esophagitis Protonix     Labs/tests ordered:  * No order type specified * Next appt:  Visit date not found

## 2023-12-20 ENCOUNTER — Encounter: Payer: Self-pay | Admitting: Adult Health

## 2023-12-20 ENCOUNTER — Non-Acute Institutional Stay (SKILLED_NURSING_FACILITY): Payer: Self-pay | Admitting: Adult Health

## 2023-12-20 DIAGNOSIS — F01C18 Vascular dementia, severe, with other behavioral disturbance: Secondary | ICD-10-CM | POA: Diagnosis not present

## 2023-12-20 DIAGNOSIS — I10 Essential (primary) hypertension: Secondary | ICD-10-CM | POA: Diagnosis not present

## 2023-12-20 DIAGNOSIS — B372 Candidiasis of skin and nail: Secondary | ICD-10-CM | POA: Diagnosis not present

## 2023-12-20 DIAGNOSIS — Z8673 Personal history of transient ischemic attack (TIA), and cerebral infarction without residual deficits: Secondary | ICD-10-CM

## 2023-12-20 MED ORDER — NYSTATIN 100000 UNIT/GM EX OINT: 1.0000 | TOPICAL_OINTMENT | Freq: Two times a day (BID) | CUTANEOUS | 0 refills | Status: AC

## 2023-12-20 NOTE — Progress Notes (Signed)
 Location:  [facility] Room Number: N18-A Place of Service:  SNF (31) Provider:  Medina-Vargas, Keyonna [place], DNP, FNP-BC  Patient Care Team: Charlanne Fredia CROME, MD as PCP - General (Internal Medicine) Ngetich, Roxan BROCKS, NP as Nurse Practitioner (Family Medicine)  Extended Emergency Contact Information Primary Emergency Contact: Gordan,[place]  Address: [address]          [place], [place] [address] [place] Home Phone: [phone] Mobile Phone: [phone] Relation: Daughter  Code Status:   DNR  Goals of care: Advanced Directive information    11/22/2023    9:56 AM  Advanced Directives  Does Patient Have a Medical Advance Directive? Yes  Type of Estate Agent of [place];Living will;Out of facility DNR (pink MOST or yellow form)  Does patient want to make changes to medical advance directive? No - Patient declined  Copy of Healthcare Power of Attorney in Chart? Yes - validated most recent copy scanned in chart (See row information)  Pre-existing out of facility DNR order (yellow form or pink MOST form) Yellow form placed in chart (order not valid for inpatient use)     Chief Complaint  Patient presents with   Acute Visit    Rashes under bilateral breasts    HPI:  Pt is a 89 y.o. female seen today for an acute visit regarding erythematous rashes under bilateral breasts. She is a resident of [id number].   Essential hypertension  _  127/77, takes Amlodipine  History of CVA (cerebrovascular accident) -  takes Atorvastatin  and Plavix   Severe vascular dementia with other behavioral disturbance (HCC)   -  BIMS score 9/15, takes Memantine  and Donepezil    Past Medical History:  Diagnosis Date   Allergic rhinitis    Anxiety    Balance problem 04/28/2015   Fatigue 04/28/2015   Hearing loss 04/28/2015   history of Fracture of right humerus 04/28/2015   HLD (hyperlipidemia) 03/05/2019   Hyperglycemia    Hyperlipidemia     Hypertension    Memory loss 04/28/2015   04/26/2014 MMSE 28/30. Failed clock drawing. 04/21/15 MMSE 21/30. Failed clock drawing.    Osteoporosis    Past Surgical History:  Procedure Laterality Date   ABDOMINAL HYSTERECTOMY     CATARACT EXTRACTION  2010   ELBOW SURGERY Right 1988   FEMUR FRACTURE SURGERY Right 06/2014   [place], [place] Dr. Norleen Savin   HUMERUS FRACTURE SURGERY Right 2015   [place], [place] Dr. Redell Pinal   TONSILLECTOMY  [phone]    Allergies  Allergen Reactions   Ace Inhibitors Other (See Comments)    Unknown reaction   Almond Oil Rash   Plum Pulp Rash    Outpatient Encounter Medications as of 12/20/2023  Medication Sig   amLODipine (NORVASC) 5 MG tablet Take 5 mg by mouth daily.    atorvastatin  (LIPITOR) 40 MG tablet Take 1 tablet (40 mg total) by mouth daily at 6 PM.   calcium  carbonate (OSCAL) 1500 (600 Ca) MG TABS tablet Take 600 mg of elemental calcium  by mouth daily with breakfast.   Cholecalciferol (VITAMIN D3) 1000 UNITS CAPS Take 1,000 Units daily by mouth.    clopidogrel  (PLAVIX ) 75 MG tablet Take 75 mg by mouth at bedtime.   cyanocobalamin  (VITAMIN B12) 1000 MCG tablet Take 1,000 mcg by mouth 3 (three) times a week. Monday, Wednesday, and Friday.   donepezil  (ARICEPT ) 10 MG tablet TAKE ONE TABLET BY MOUTH DAILY TO PRESERVE MEMORY   Emollient (CERAVE) CREA  Apply 1 Application topically daily. Apply to back, face, legs, and arms.   ferrous sulfate 325 (65 FE) MG EC tablet Take 325 mg by mouth 3 (three) times a week. Give M/W/F   hydrocortisone  cream 0.5 % Apply 1 Application topically every 12 (twelve) hours as needed for itching.   lactose free nutrition (BOOST) LIQD Take 240 mLs by mouth in the morning and at bedtime.   memantine  (NAMENDA ) 10 MG tablet Take 1 tablet (10 mg total) by mouth 2 (two) times daily.   nystatin  cream (MYCOSTATIN ) Apply 1 Application topically 2 (two) times daily.   Omega-3 Fatty Acids (FISH OIL ) 500 MG CAPS Take 1 capsule by  mouth at bedtime.   pantoprazole  (PROTONIX ) 20 MG tablet Take 20 mg by mouth daily.   sertraline (ZOLOFT) 100 MG tablet Take 100 mg by mouth daily.   triamcinolone  cream (KENALOG ) 0.1 % Apply 1 application. topically 2 (two) times daily as needed (For red area on left arm).   zinc oxide 20 % ointment Apply 1 application topically as needed for irritation. Apply to peri/buttocks after every incontinent episode   No facility-administered encounter medications on file as of 12/20/2023.    Review of Systems  Unable to obtain due to dementia    Immunization History  Administered Date(s) Administered   DTaP 05/12/2014   Fluad Quad(high Dose 65+) 10/06/2022   Influenza, High Dose Seasonal PF 09/21/2017, 09/27/2019, 09/16/2020, 10/12/2023   Influenza,inj,Quad PF,6+ Mos 09/14/2018   Influenza-Unspecified 10/07/2014, 09/11/2015, 09/23/2016, 10/07/2021   Moderna Covid-19 Vaccine Bivalent Booster 80yrs & up 10/19/2023   Moderna Sars-Covid-2 Vaccination 12/17/2019, 01/14/2020, 10/27/2020, 05/20/2021   PPD Test 10/16/2014   Pneumococcal Conjugate-13 10/20/2017   Pneumococcal Polysaccharide-23 08/20/2011   Unspecified SARS-COV-2 Vaccination 05/26/2022, 10/19/2022   Pertinent  Health Maintenance Due  Topic Date Due   DEXA SCAN  Never done   HEMOGLOBIN A1C  04/19/2024   FOOT EXAM  07/12/2024   INFLUENZA VACCINE  Completed   OPHTHALMOLOGY EXAM  Discontinued      04/14/2023   11:07 AM 05/16/2023   12:21 PM 06/17/2023    1:36 PM 07/13/2023   10:34 AM 08/05/2023    4:41 PM  Fall Risk  Falls in the past year? 0 1 1 1 1   Was there an injury with Fall? 0 0 0 0 1  Fall Risk Category Calculator 0 1 1 1 3   Patient at Risk for Falls Due to No Fall Risks History of fall(s);Impaired balance/gait;Impaired mobility History of fall(s);Impaired balance/gait History of fall(s);Impaired balance/gait;Impaired mobility History of fall(s);Impaired balance/gait;Impaired mobility  Fall risk Follow up Falls evaluation  completed Falls evaluation completed;Education provided;Falls prevention discussed Falls evaluation completed;Education provided;Falls prevention discussed Falls evaluation completed;Education provided;Falls prevention discussed Falls evaluation completed;Education provided     Vitals:   12/20/23 1643  BP: 127/77  Pulse: 71  Resp: 16  Temp: (!) 97 F (36.1 C)  SpO2: 94%  Weight: 129 lb 6.4 oz (58.7 kg)  Height: 5' 2 (1.575 m)   Body mass index is 23.67 kg/m.  Physical Exam Constitutional:      Appearance: Normal appearance.  HENT:     Head: Normocephalic and atraumatic.     Nose: Nose normal.     Mouth/Throat:     Mouth: Mucous membranes are moist.  Eyes:     Conjunctiva/sclera: Conjunctivae normal.  Cardiovascular:     Rate and Rhythm: Normal rate and regular rhythm.  Pulmonary:     Effort: Pulmonary effort is normal.  Breath sounds: Normal breath sounds.  Abdominal:     General: Bowel sounds are normal.     Palpations: Abdomen is soft.  Musculoskeletal:     Cervical back: Normal range of motion.  Skin:    General: Skin is warm and dry.     Findings: Rash present.     Comments: Has erythematous rashes under bilateral breasts  Neurological:     Mental Status: She is alert. Mental status is at baseline.  Psychiatric:        Mood and Affect: Mood normal.        Behavior: Behavior normal.        Labs reviewed: Recent Labs    02/03/23 0000 03/18/23 0000 09/16/23 0000  NA 143 141 145  K 4.2 4.1 3.6  CL 106 106 110*  CO2 31* 32* 27*  BUN 31* 24* 26*  CREATININE 1.2* 1.1 1.0  CALCIUM  9.4 8.8 8.7   Recent Labs    02/03/23 0000 03/18/23 0000 09/16/23 0000  AST 12* 12* 10*  ALT 16 13 12   ALKPHOS 71 58 73  ALBUMIN 4.0 3.5 3.4*   Recent Labs    02/03/23 0000 03/18/23 0000 09/16/23 0000  WBC 10.5 7.2 9.1  NEUTROABS  --  4,032.00 5,132.00  HGB 10.7* 9.4* 10.7*  HCT 34* 31* 34*  PLT 264 217 214   Lab Results  Component Value Date   TSH  2.77 08/24/2021   Lab Results  Component Value Date   HGBA1C 5.9 10/21/2023   Lab Results  Component Value Date   CHOL 120 10/21/2023   HDL 36 10/21/2023   LDLCALC 65 10/21/2023   TRIG 109 10/21/2023   CHOLHDL 6.3 03/06/2019    Significant Diagnostic Results in last 30 days:  No results found.  Assessment/Plan  1. Candidal skin infection (Primary) -  will start Nystatin  ointment -  skin care daily - nystatin  ointment (MYCOSTATIN ); Apply 1 Application topically 2 (two) times daily for 14 days. Apply topically to rashes under bilateral breasts  Dispense: 28 g; Refill: 0  2. Essential hypertension -  BP stable -  continue Amlodipine  3. History of CVA (cerebrovascular accident) -  stable -   continue Plavix  and Atorvastatin   4. Severe vascular dementia with other behavioral disturbance (HCC) -  BIMS score 9/15, has moderate cognitive impairment -  continue Memantine  and Donepezil     Family/ staff Communication: Discussed plan of care with resident and charge nurse.  Labs/tests ordered: None    Adrinne Sze Medina-Vargas, DNP, MSN, FNP-BC [facility] [phone] (Monday-Friday 8:00 a.m. - 5:00 p.m.) [phone] (after hours)

## 2024-01-16 ENCOUNTER — Encounter: Payer: Self-pay | Admitting: Orthopedic Surgery

## 2024-01-16 ENCOUNTER — Non-Acute Institutional Stay (SKILLED_NURSING_FACILITY): Payer: Self-pay | Admitting: Orthopedic Surgery

## 2024-01-16 DIAGNOSIS — R7303 Prediabetes: Secondary | ICD-10-CM

## 2024-01-16 DIAGNOSIS — E782 Mixed hyperlipidemia: Secondary | ICD-10-CM

## 2024-01-16 DIAGNOSIS — N1831 Chronic kidney disease, stage 3a: Secondary | ICD-10-CM

## 2024-01-16 DIAGNOSIS — D638 Anemia in other chronic diseases classified elsewhere: Secondary | ICD-10-CM

## 2024-01-16 DIAGNOSIS — I10 Essential (primary) hypertension: Secondary | ICD-10-CM | POA: Diagnosis not present

## 2024-01-16 DIAGNOSIS — F0150 Vascular dementia without behavioral disturbance: Secondary | ICD-10-CM

## 2024-01-16 DIAGNOSIS — F424 Excoriation (skin-picking) disorder: Secondary | ICD-10-CM | POA: Diagnosis not present

## 2024-01-16 DIAGNOSIS — F339 Major depressive disorder, recurrent, unspecified: Secondary | ICD-10-CM

## 2024-01-16 DIAGNOSIS — Z8673 Personal history of transient ischemic attack (TIA), and cerebral infarction without residual deficits: Secondary | ICD-10-CM

## 2024-01-16 DIAGNOSIS — K219 Gastro-esophageal reflux disease without esophagitis: Secondary | ICD-10-CM

## 2024-01-16 NOTE — Progress Notes (Signed)
Location:  [facility] Room Number: 18/A Place of Service:  SNF [phone]) Provider:  Octavia Heir, NP   [name], MD  Patient Care Team: [name], MD as PCP - General (Internal Medicine) Ngetich, Donalee Citrin, NP as Nurse Practitioner [facility])  Extended Emergency Contact Information Primary Emergency Contact: Gordan,[place] Address: [address]          [place], [place] [address] [place] of [place] Home Phone: [phone] Mobile Phone: [phone] Relation: Daughter  Code Status:  DNR Goals of care: Advanced Directive information    11/22/2023    9:56 AM  Advanced Directives  Does Patient Have a Medical Advance Directive? Yes  Type of Estate agent of [address];Living will;Out of facility DNR (pink MOST or yellow form)  Does patient want to make changes to medical advance directive? No - Patient declined  Copy of Healthcare Power of Attorney in Chart? Yes - validated most recent copy scanned in chart (See row information)  Pre-existing out of facility DNR order (yellow form or pink MOST form) Yellow form placed in chart (order not valid for inpatient use)     Chief Complaint  Patient presents with   Medical Management of Chronic Issues    HPI:  Pt is a 89 y.o. female seen today for medical management of chronic diseases.    She currently resides on the skilled nursing unit at [facility]. Past medical conditions include: hypertension, CVA, dementia, GERD, diabetes, CKD stage 3, hyperlipidemia, depression, anxiety and abnormal gait.    Prediabetes- A1c 5.9 (11/08)> was 6.2 (04/05), remains on regular diet Dementia- CT head 02/2019 confirmed small white matter disease, BIMS 9/15 (09/17)> was 13/15 (06/19), no recent behaviors, sleeping more> some weight loss> 5 lbs, dependent with ADLs except feeding, remains on Aricept and Namenda  Skin picking- remains on hydrocortisone, Kenalog and Zoloft HTN- BUN/creat  26/1.0 09/16/2023, remains on amlodipine HLD- LDL 65 (11/08), remains on Lipitor> AST/ALT 12/13 (04/05) CKD- BUN/creat 26/1.0 GFR 51 09/16/2023 HX CVA-  hx acute infarct on left Para median pons, remains on plavix and statin Depression/Anxiety- no mood changes, Na+ 145 09/16/2023, remains on Zoloft  Anemia- Hgb 10.7 (10/04)> was 9.4 (04/05), remains on ferrous sulfate 3x/week GERD- remains on Protonix  No recent falls or injuries.   Recent blood pressures:  02/03- 127/73  02/02- 139/84  02/01- 127/63  Recent weights:  02/03- 129.2 lbs  01/07- 129.4 lbs  12/01- 129.2 lbs   Past Medical History:  Diagnosis Date   Allergic rhinitis    Anxiety    Balance problem 04/28/2015   Fatigue 04/28/2015   Hearing loss 04/28/2015   history of Fracture of right humerus 04/28/2015   HLD (hyperlipidemia) 03/05/2019   Hyperglycemia    Hyperlipidemia    Hypertension    Memory loss 04/28/2015   04/26/2014 MMSE 28/30. Failed clock drawing. 04/21/15 MMSE 21/30. Failed clock drawing.    Osteoporosis    Past Surgical History:  Procedure Laterality Date   ABDOMINAL HYSTERECTOMY     CATARACT EXTRACTION  2010   ELBOW SURGERY Right 1988   FEMUR FRACTURE SURGERY Right 06/2014   [place], [place] Dr. Dione Housekeeper   HUMERUS FRACTURE SURGERY Right 2015   [place], [place] Dr. Eber Hong   TONSILLECTOMY  [phone]    Allergies  Allergen Reactions   Ace Inhibitors Other (See Comments)    Unknown reaction   Almond Oil Rash   Plum Pulp Rash  Outpatient Encounter Medications as of 01/16/2024  Medication Sig   amLODipine (NORVASC) 5 MG tablet Take 5 mg by mouth daily.    atorvastatin (LIPITOR) 40 MG tablet Take 1 tablet (40 mg total) by mouth daily at 6 PM.   calcium carbonate (OSCAL) 1500 (600 Ca) MG TABS tablet Take 600 mg of elemental calcium by mouth daily with breakfast.   Cholecalciferol (VITAMIN D3) 1000 UNITS CAPS Take 1,000 Units daily by mouth.    clopidogrel (PLAVIX) 75 MG tablet Take 75 mg by mouth  at bedtime.   cyanocobalamin (VITAMIN B12) 1000 MCG tablet Take 1,000 mcg by mouth 3 (three) times a week. Monday, Wednesday, and Friday.   donepezil (ARICEPT) 10 MG tablet TAKE ONE TABLET BY MOUTH DAILY TO PRESERVE MEMORY   Emollient (CERAVE) CREA Apply 1 Application topically daily. Apply to back, face, legs, and arms.   ferrous sulfate 325 (65 FE) MG EC tablet Take 325 mg by mouth 3 (three) times a week. Give M/W/F   hydrocortisone cream 0.5 % Apply 1 Application topically every 12 (twelve) hours as needed for itching.   lactose free nutrition (BOOST) LIQD Take 240 mLs by mouth in the morning and at bedtime.   memantine (NAMENDA) 10 MG tablet Take 1 tablet (10 mg total) by mouth 2 (two) times daily.   Omega-3 Fatty Acids (FISH OIL) 500 MG CAPS Take 1 capsule by mouth at bedtime.   pantoprazole (PROTONIX) 20 MG tablet Take 20 mg by mouth daily.   sertraline (ZOLOFT) 100 MG tablet Take 100 mg by mouth daily.   triamcinolone cream (KENALOG) 0.1 % Apply 1 application. topically 2 (two) times daily as needed (For red area on left arm).   zinc oxide 20 % ointment Apply 1 application topically as needed for irritation. Apply to peri/buttocks after every incontinent episode   No facility-administered encounter medications on file as of 01/16/2024.    Review of Systems  Unable to perform ROS: Dementia    Immunization History  Administered Date(s) Administered   DTaP 05/12/2014   Fluad Quad(high Dose 65+) 10/06/2022   Influenza, High Dose Seasonal PF 09/21/2017, 09/27/2019, 09/16/2020, 10/12/2023   Influenza,inj,Quad PF,6+ Mos 09/14/2018   Influenza-Unspecified 10/07/2014, 09/11/2015, 09/23/2016, 10/07/2021   Moderna Covid-19 Vaccine Bivalent Booster 20yrs & up 10/19/2023   Moderna Sars-Covid-2 Vaccination 12/17/2019, 01/14/2020, 10/27/2020, 05/20/2021   PPD Test 10/16/2014   Pneumococcal Conjugate-13 10/20/2017   Pneumococcal Polysaccharide-23 08/20/2011   Unspecified SARS-COV-2 Vaccination  05/26/2022, 10/19/2022   Pertinent  Health Maintenance Due  Topic Date Due   DEXA SCAN  Never done   HEMOGLOBIN A1C  04/19/2024   FOOT EXAM  07/12/2024   INFLUENZA VACCINE  Completed   OPHTHALMOLOGY EXAM  Discontinued      04/14/2023   11:07 AM 05/16/2023   12:21 PM 06/17/2023    1:36 PM 07/13/2023   10:34 AM 08/05/2023    4:41 PM  Fall Risk  Falls in the past year? 0 1 1 1 1   Was there an injury with Fall? 0 0 0 0 1  Fall Risk Category Calculator 0 1 1 1 3   Patient at Risk for Falls Due to No Fall Risks History of fall(s);Impaired balance/gait;Impaired mobility History of fall(s);Impaired balance/gait History of fall(s);Impaired balance/gait;Impaired mobility History of fall(s);Impaired balance/gait;Impaired mobility  Fall risk Follow up Falls evaluation completed Falls evaluation completed;Education provided;Falls prevention discussed Falls evaluation completed;Education provided;Falls prevention discussed Falls evaluation completed;Education provided;Falls prevention discussed Falls evaluation completed;Education provided   Functional Status Survey:  Vitals:   01/16/24 1108  BP: 127/73  Pulse: 78  Resp: 17  Temp: (!) 97.2 F (36.2 C)  SpO2: 99%  Weight: 129 lb 3.2 oz (58.6 kg)  Height: 5\' 2"  (1.575 m)   Body mass index is 23.63 kg/m. Physical Exam Vitals reviewed.  Constitutional:      General: She is not in acute distress. HENT:     Head: Normocephalic.     Right Ear: There is no impacted cerumen.     Left Ear: There is no impacted cerumen.     Nose: Nose normal.     Mouth/Throat:     Mouth: Mucous membranes are moist.  Eyes:     General:        Right eye: No discharge.        Left eye: No discharge.  Cardiovascular:     Rate and Rhythm: Normal rate and regular rhythm.     Pulses: Normal pulses.     Heart sounds: Normal heart sounds.  Pulmonary:     Effort: Pulmonary effort is normal.     Breath sounds: Normal breath sounds.  Abdominal:     General: Bowel  sounds are normal.     Palpations: Abdomen is soft.  Musculoskeletal:     Cervical back: Neck supple.     Right lower leg: No edema.     Left lower leg: No edema.  Skin:    General: Skin is warm.     Capillary Refill: Capillary refill takes less than 2 seconds.     Comments: Noted to left medial canthus, pea sized, no sign of infection, scab intact, overall skin appearance dry  Neurological:     General: No focal deficit present.     Mental Status: She is alert. Mental status is at baseline.     Motor: Weakness present.     Gait: Gait abnormal.     Comments: wheelchair  Psychiatric:        Mood and Affect: Mood normal.     Labs reviewed: Recent Labs    02/03/23 0000 03/18/23 0000 09/16/23 0000  NA 143 141 145  K 4.2 4.1 3.6  CL 106 106 110*  CO2 31* 32* 27*  BUN 31* 24* 26*  CREATININE 1.2* 1.1 1.0  CALCIUM 9.4 8.8 8.7   Recent Labs    02/03/23 0000 03/18/23 0000 09/16/23 0000  AST 12* 12* 10*  ALT 16 13 12   ALKPHOS 71 58 73  ALBUMIN 4.0 3.5 3.4*   Recent Labs    02/03/23 0000 03/18/23 0000 09/16/23 0000  WBC 10.5 7.2 9.1  NEUTROABS  --  4,032.00 5,132.00  HGB 10.7* 9.4* 10.7*  HCT 34* 31* 34*  PLT 264 217 214   Lab Results  Component Value Date   TSH 2.77 08/24/2021   Lab Results  Component Value Date   HGBA1C 5.9 10/21/2023   Lab Results  Component Value Date   CHOL 120 10/21/2023   HDL 36 10/21/2023   LDLCALC 65 10/21/2023   TRIG 109 10/21/2023   CHOLHDL 6.3 03/06/2019    Significant Diagnostic Results in last 30 days:  No results found.  Assessment/Plan: 1. Prediabetes (Primary) - A1c stable - diet controlled  2. Vascular dementia without behavioral disturbance (HCC) - no behaviors other than skin picking  - dependent with ADLs except feeding - weight stable - cont Aricept and Namenda - cont skilled nursing   3. Skin-picking disorder - ongoing - lesion to left medial canthus,  no infection - cont hydrocortisone/Kenalog and  Zoloft  4. Primary hypertension - controlled with amlodipine  5. Mixed hyperlipidemia - cont statin  6. Stage 3a chronic kidney disease (HCC) - avoid NSAIDS  - encourage hydration with water  7. Anemia, chronic disease - hgb stable - cont ferrous sulfate  8. History of CVA (cerebrovascular accident) - cont plavix and statin  9. Depression, recurrent (HCC) - no mood changes - Na+ stable - cont Zoloft  10. Gastroesophageal reflux disease without esophagitis - hgb stable - cont pantoprazole    Family/ staff Communication: plan discussed with patient and nurse  Labs/tests ordered:  none

## 2024-01-16 NOTE — Progress Notes (Signed)
Subjective:    Patient ID: Ana [name], female    DOB: 05-27-1926, 89 y.o.   MRN: [id number]  HPI Patient is a 89 year old female who was seen today for medical management of chronic conditions. The patient is a resident at [facility] receiving skilled nursing care. She has a past medical history of : Hypertension, CVA,  vascular dementia, GERD, secondary diabetes with CKD Stage 3, hyperlipidemia, depression, anxiety, allergic rhinitis, osteoporosis, and abnormal gait.    Past Medical History:  Diagnosis Date   Allergic rhinitis    Anxiety    Balance problem 04/28/2015   Fatigue 04/28/2015   Hearing loss 04/28/2015   history of Fracture of right humerus 04/28/2015   HLD (hyperlipidemia) 03/05/2019   Hyperglycemia    Hyperlipidemia    Hypertension    Memory loss 04/28/2015   04/26/2014 MMSE 28/30. Failed clock drawing. 04/21/15 MMSE 21/30. Failed clock drawing.    Osteoporosis    Current Outpatient Medications on File Prior to Visit  Medication Sig   amLODipine (NORVASC) 5 MG tablet Take 5 mg by mouth daily.    atorvastatin (LIPITOR) 40 MG tablet Take 1 tablet (40 mg total) by mouth daily at 6 PM.   calcium carbonate (OSCAL) 1500 (600 Ca) MG TABS tablet Take 600 mg of elemental calcium by mouth daily with breakfast.   Cholecalciferol (VITAMIN D3) 1000 UNITS CAPS Take 1,000 Units daily by mouth.    clopidogrel (PLAVIX) 75 MG tablet Take 75 mg by mouth at bedtime.   cyanocobalamin (VITAMIN B12) 1000 MCG tablet Take 1,000 mcg by mouth 3 (three) times a week. Monday, Wednesday, and Friday.   donepezil (ARICEPT) 10 MG tablet TAKE ONE TABLET BY MOUTH DAILY TO PRESERVE MEMORY   Emollient (CERAVE) CREA Apply 1 Application topically daily. Apply to back, face, legs, and arms.   ferrous sulfate 325 (65 FE) MG EC tablet Take 325 mg by mouth 3 (three) times a week. Give M/W/F   hydrocortisone cream 0.5 % Apply 1 Application topically every 12 (twelve) hours as needed for itching.    lactose free nutrition (BOOST) LIQD Take 240 mLs by mouth in the morning and at bedtime.   memantine (NAMENDA) 10 MG tablet Take 1 tablet (10 mg total) by mouth 2 (two) times daily.   Omega-3 Fatty Acids (FISH OIL) 500 MG CAPS Take 1 capsule by mouth at bedtime.   pantoprazole (PROTONIX) 20 MG tablet Take 20 mg by mouth daily.   sertraline (ZOLOFT) 100 MG tablet Take 100 mg by mouth daily.   triamcinolone cream (KENALOG) 0.1 % Apply 1 application. topically 2 (two) times daily as needed (For red area on left arm).   zinc oxide 20 % ointment Apply 1 application topically as needed for irritation. Apply to peri/buttocks after every incontinent episode   No current facility-administered medications on file prior to visit.   ROS Unable to perform ROS due to Dementia     Objective:   Physical Exam HENT:     Head: Normocephalic and atraumatic.     Right Ear: External ear normal.     Left Ear: External ear normal.     Mouth/Throat:     Mouth: Mucous membranes are moist.     Pharynx: Oropharynx is clear.  Eyes:     Extraocular Movements: Extraocular movements intact.     Conjunctiva/sclera: Conjunctivae normal.     Pupils: Pupils are equal, round, and reactive to light.  Neck:     Comments:  Lordosis present in patients upper back.  Cardiovascular:     Rate and Rhythm: Normal rate and regular rhythm.     Pulses: Normal pulses.  Pulmonary:     Effort: Pulmonary effort is normal.     Breath sounds: Normal breath sounds.     Comments: Diminished breath sounds present in the posterior upper and lower lobes bilaterally.  Abdominal:     General: Abdomen is flat. Bowel sounds are normal.     Palpations: Abdomen is soft.  Musculoskeletal:        General: Normal range of motion.     Cervical back: Tenderness present.  Skin:    General: Skin is warm.     Capillary Refill: Capillary refill takes less than 2 seconds.     Comments: Approx 2mm eschar is present at the medial canthus of the  patient's left eye, adjacent to the nasal bridge.  Neurological:     Mental Status: She is alert. Mental status is at baseline.  Psychiatric:        Mood and Affect: Mood normal.        Speech: Speech normal.        Behavior: Behavior normal.        Cognition and Memory: Cognition is impaired. Memory is impaired.       Assessment & Plan:  Severe vascular dementia with behavioral disturbance.   - No recent behavioral disturbances documented - Currently taking Namenda and Aricept   2. Skin Picking Disorder -Skin picking due to AD  -No signs of infection  -No signs of unhealed wounds -Continue Zoloft 100 mg daily  -Continue Kenalog cream and hydrocortisone cream as ordered for skin irritation  3. Essential Hypertension Recent Blood pressures :  142/79 -12/18/2023 127/77 -12/20/2023 127/73 - 01/16/2024   -Blood pressures are controlled -Continue Amplodipine  4. Mixed hyperlipidemia - LDL 65 on 1(12/20/2022), at goal <70 - Continue Atorvastatin  -Recheck lipid panel (04/2024)  5. Stage 3a chronic kidney disease -Encourage hydration with water -Patient should refrain from taking NSAIDs  7.Prediabetes  -Hemoglobin A1C 5.9 on 10/21/2023 -Controlled with diet -Recheck of A1C in 04/2024

## 2024-02-11 DEATH — deceased
